# Patient Record
Sex: Female | Born: 1935 | Race: White | Hispanic: No | Marital: Married | Smoking: Former smoker
Health system: Southern US, Community
[De-identification: ages and names within clinical notes are randomized; demographics above are authoritative.]

## PROBLEM LIST (undated history)

## (undated) DIAGNOSIS — R000 Tachycardia, unspecified: Secondary | ICD-10-CM

## (undated) DIAGNOSIS — I499 Cardiac arrhythmia, unspecified: Secondary | ICD-10-CM

## (undated) DIAGNOSIS — I714 Abdominal aortic aneurysm, without rupture, unspecified: Secondary | ICD-10-CM

## (undated) DIAGNOSIS — IMO0001 Reserved for inherently not codable concepts without codable children: Secondary | ICD-10-CM

## (undated) DIAGNOSIS — I509 Heart failure, unspecified: Secondary | ICD-10-CM

## (undated) DIAGNOSIS — E119 Type 2 diabetes mellitus without complications: Secondary | ICD-10-CM

## (undated) DIAGNOSIS — K219 Gastro-esophageal reflux disease without esophagitis: Secondary | ICD-10-CM

## (undated) DIAGNOSIS — J209 Acute bronchitis, unspecified: Secondary | ICD-10-CM

## (undated) DIAGNOSIS — I10 Essential (primary) hypertension: Secondary | ICD-10-CM

## (undated) DIAGNOSIS — D649 Anemia, unspecified: Secondary | ICD-10-CM

## (undated) HISTORY — DX: Cardiac arrhythmia, unspecified: I49.9

## (undated) HISTORY — PX: ABDOMINAL HYSTERECTOMY: SHX81

## (undated) HISTORY — DX: Anemia, unspecified: D64.9

## (undated) HISTORY — PX: BREAST LUMPECTOMY: SHX2

## (undated) HISTORY — DX: Acute bronchitis, unspecified: J20.9

## (undated) HISTORY — PX: OOPHORECTOMY: SHX86

## (undated) HISTORY — DX: Heart failure, unspecified: I50.9

## (undated) HISTORY — DX: Tachycardia, unspecified: R00.0

---

## 1999-08-20 ENCOUNTER — Encounter (INDEPENDENT_AMBULATORY_CARE_PROVIDER_SITE_OTHER): Payer: Self-pay

## 1999-08-20 ENCOUNTER — Ambulatory Visit (HOSPITAL_COMMUNITY): Admission: RE | Admit: 1999-08-20 | Discharge: 1999-08-20 | Payer: Self-pay | Admitting: Gastroenterology

## 2000-09-19 ENCOUNTER — Encounter: Payer: Self-pay | Admitting: Gastroenterology

## 2000-09-19 ENCOUNTER — Encounter: Admission: RE | Admit: 2000-09-19 | Discharge: 2000-09-19 | Payer: Self-pay | Admitting: Gastroenterology

## 2002-01-12 ENCOUNTER — Encounter: Payer: Self-pay | Admitting: Emergency Medicine

## 2002-01-12 ENCOUNTER — Inpatient Hospital Stay (HOSPITAL_COMMUNITY): Admission: EM | Admit: 2002-01-12 | Discharge: 2002-01-15 | Payer: Self-pay | Admitting: Emergency Medicine

## 2004-07-04 ENCOUNTER — Encounter (INDEPENDENT_AMBULATORY_CARE_PROVIDER_SITE_OTHER): Payer: Self-pay | Admitting: *Deleted

## 2004-07-04 ENCOUNTER — Ambulatory Visit (HOSPITAL_COMMUNITY): Admission: RE | Admit: 2004-07-04 | Discharge: 2004-07-04 | Payer: Self-pay | Admitting: Gastroenterology

## 2009-07-07 ENCOUNTER — Emergency Department (HOSPITAL_COMMUNITY): Admission: EM | Admit: 2009-07-07 | Discharge: 2009-07-07 | Payer: Self-pay | Admitting: Emergency Medicine

## 2009-12-29 ENCOUNTER — Ambulatory Visit: Payer: Self-pay | Admitting: Vascular Surgery

## 2010-04-07 ENCOUNTER — Emergency Department (HOSPITAL_COMMUNITY): Admission: EM | Admit: 2010-04-07 | Discharge: 2010-04-07 | Payer: Self-pay | Admitting: Emergency Medicine

## 2010-06-22 ENCOUNTER — Encounter: Admission: RE | Admit: 2010-06-22 | Discharge: 2010-06-22 | Payer: Self-pay | Admitting: Vascular Surgery

## 2010-06-22 ENCOUNTER — Ambulatory Visit: Payer: Self-pay | Admitting: Vascular Surgery

## 2010-07-13 ENCOUNTER — Ambulatory Visit: Payer: Self-pay | Admitting: Vascular Surgery

## 2010-07-19 ENCOUNTER — Ambulatory Visit (HOSPITAL_COMMUNITY): Admission: RE | Admit: 2010-07-19 | Discharge: 2010-07-19 | Payer: Self-pay | Admitting: Vascular Surgery

## 2010-07-30 ENCOUNTER — Encounter: Payer: Self-pay | Admitting: Vascular Surgery

## 2010-07-30 ENCOUNTER — Ambulatory Visit: Payer: Self-pay | Admitting: Vascular Surgery

## 2010-07-30 ENCOUNTER — Inpatient Hospital Stay (HOSPITAL_COMMUNITY): Admission: RE | Admit: 2010-07-30 | Discharge: 2010-08-06 | Payer: Self-pay | Admitting: Vascular Surgery

## 2010-09-11 ENCOUNTER — Ambulatory Visit: Payer: Self-pay | Admitting: Vascular Surgery

## 2010-11-13 ENCOUNTER — Ambulatory Visit: Payer: Self-pay | Admitting: Vascular Surgery

## 2010-12-02 HISTORY — PX: ABDOMINAL AORTIC ANEURYSM REPAIR: SUR1152

## 2011-02-14 LAB — TYPE AND SCREEN
ABO/RH(D): O POS
ABO/RH(D): O POS
Antibody Screen: NEGATIVE

## 2011-02-14 LAB — CBC
HCT: 26.8 % — ABNORMAL LOW (ref 36.0–46.0)
HCT: 26.7 % — ABNORMAL LOW (ref 36.0–46.0)
HCT: 31.8 % — ABNORMAL LOW (ref 36.0–46.0)
HCT: 35.4 % — ABNORMAL LOW (ref 36.0–46.0)
HCT: 36.7 % (ref 36.0–46.0)
Hemoglobin: 8.4 g/dL — ABNORMAL LOW (ref 12.0–15.0)
Hemoglobin: 11.5 g/dL — ABNORMAL LOW (ref 12.0–15.0)
Hemoglobin: 12 g/dL (ref 12.0–15.0)
Hemoglobin: 8.6 g/dL — ABNORMAL LOW (ref 12.0–15.0)
Hemoglobin: 9.9 g/dL — ABNORMAL LOW (ref 12.0–15.0)
MCH: 28.7 pg (ref 26.0–34.0)
MCH: 28 pg (ref 26.0–34.0)
MCHC: 31.3 g/dL (ref 30.0–36.0)
MCHC: 32.2 g/dL (ref 30.0–36.0)
MCV: 89.9 fL (ref 78.0–100.0)
MCV: 90.5 fL (ref 78.0–100.0)
MCV: 89.6 fL (ref 78.0–100.0)
MCV: 89.7 fL (ref 78.0–100.0)
MCV: 90.1 fL (ref 78.0–100.0)
Platelets: 274 10*3/uL (ref 150–400)
RBC: 2.75 MIL/uL — ABNORMAL LOW (ref 3.87–5.11)
RBC: 3.53 MIL/uL — ABNORMAL LOW (ref 3.87–5.11)
RBC: 3.95 MIL/uL (ref 3.87–5.11)
RBC: 4.09 MIL/uL (ref 3.87–5.11)
RDW: 15.2 % (ref 11.5–15.5)
RDW: 15.1 % (ref 11.5–15.5)
WBC: 10 10*3/uL (ref 4.0–10.5)
WBC: 10.3 10*3/uL (ref 4.0–10.5)
WBC: 7.4 10*3/uL (ref 4.0–10.5)

## 2011-02-14 LAB — BLOOD GAS, ARTERIAL
Acid-base deficit: 1.5 mmol/L (ref 0.0–2.0)
Acid-base deficit: 2 mmol/L (ref 0.0–2.0)
Acid-base deficit: 2.5 mmol/L — ABNORMAL HIGH (ref 0.0–2.0)
Bicarbonate: 21.6 mEq/L (ref 20.0–24.0)
Bicarbonate: 21.8 mEq/L (ref 20.0–24.0)
Bicarbonate: 23.7 mEq/L (ref 20.0–24.0)
O2 Content: 4 L/min
O2 Saturation: 95.7 %
Patient temperature: 98.6
TCO2: 22.7 mmol/L (ref 0–100)
TCO2: 22.9 mmol/L (ref 0–100)
TCO2: 25.1 mmol/L (ref 0–100)
pCO2 arterial: 34.5 mmHg — ABNORMAL LOW (ref 35.0–45.0)
pCO2 arterial: 35.8 mmHg (ref 35.0–45.0)
pCO2 arterial: 47.1 mmHg — ABNORMAL HIGH (ref 35.0–45.0)
pH, Arterial: 7.322 — ABNORMAL LOW (ref 7.350–7.400)
pH, Arterial: 7.397 (ref 7.350–7.400)
pO2, Arterial: 78.7 mmHg — ABNORMAL LOW (ref 80.0–100.0)
pO2, Arterial: 88.3 mmHg (ref 80.0–100.0)

## 2011-02-14 LAB — GLUCOSE, CAPILLARY
Glucose-Capillary: 102 mg/dL — ABNORMAL HIGH (ref 70–99)
Glucose-Capillary: 104 mg/dL — ABNORMAL HIGH (ref 70–99)
Glucose-Capillary: 106 mg/dL — ABNORMAL HIGH (ref 70–99)
Glucose-Capillary: 110 mg/dL — ABNORMAL HIGH (ref 70–99)
Glucose-Capillary: 119 mg/dL — ABNORMAL HIGH (ref 70–99)
Glucose-Capillary: 123 mg/dL — ABNORMAL HIGH (ref 70–99)
Glucose-Capillary: 128 mg/dL — ABNORMAL HIGH (ref 70–99)
Glucose-Capillary: 131 mg/dL — ABNORMAL HIGH (ref 70–99)
Glucose-Capillary: 131 mg/dL — ABNORMAL HIGH (ref 70–99)
Glucose-Capillary: 133 mg/dL — ABNORMAL HIGH (ref 70–99)
Glucose-Capillary: 133 mg/dL — ABNORMAL HIGH (ref 70–99)
Glucose-Capillary: 139 mg/dL — ABNORMAL HIGH (ref 70–99)
Glucose-Capillary: 147 mg/dL — ABNORMAL HIGH (ref 70–99)
Glucose-Capillary: 151 mg/dL — ABNORMAL HIGH (ref 70–99)
Glucose-Capillary: 159 mg/dL — ABNORMAL HIGH (ref 70–99)

## 2011-02-14 LAB — URINALYSIS, ROUTINE W REFLEX MICROSCOPIC
Bilirubin Urine: NEGATIVE
Bilirubin Urine: NEGATIVE
Glucose, UA: NEGATIVE mg/dL
Glucose, UA: NEGATIVE mg/dL
Hgb urine dipstick: NEGATIVE
Ketones, ur: NEGATIVE mg/dL
Nitrite: POSITIVE — AB
Protein, ur: NEGATIVE mg/dL
Specific Gravity, Urine: 1.016 (ref 1.005–1.030)
pH: 6 (ref 5.0–8.0)

## 2011-02-14 LAB — COMPREHENSIVE METABOLIC PANEL
ALT: 10 U/L (ref 0–35)
ALT: 11 U/L (ref 0–35)
ALT: 11 U/L (ref 0–35)
Alkaline Phosphatase: 20 U/L — ABNORMAL LOW (ref 39–117)
Alkaline Phosphatase: 49 U/L (ref 39–117)
Alkaline Phosphatase: 52 U/L (ref 39–117)
BUN: 19 mg/dL (ref 6–23)
BUN: 22 mg/dL (ref 6–23)
CO2: 24 mEq/L (ref 19–32)
CO2: 25 mEq/L (ref 19–32)
Chloride: 107 mEq/L (ref 96–112)
Chloride: 111 mEq/L (ref 96–112)
GFR calc non Af Amer: 50 mL/min — ABNORMAL LOW (ref >60)
Glucose, Bld: 113 mg/dL — ABNORMAL HIGH (ref 70–99)
Glucose, Bld: 126 mg/dL — ABNORMAL HIGH (ref 70–99)
Glucose, Bld: 153 mg/dL — ABNORMAL HIGH (ref 70–99)
Potassium: 3.8 mEq/L (ref 3.5–5.1)
Potassium: 4.1 mEq/L (ref 3.5–5.1)
Potassium: 4.1 mEq/L (ref 3.5–5.1)
Sodium: 134 mEq/L — ABNORMAL LOW (ref 135–145)
Sodium: 138 mEq/L (ref 135–145)
Sodium: 139 mEq/L (ref 135–145)
Total Bilirubin: 0.4 mg/dL (ref 0.3–1.2)
Total Bilirubin: 0.5 mg/dL (ref 0.3–1.2)
Total Bilirubin: 0.6 mg/dL (ref 0.3–1.2)
Total Protein: 7.1 g/dL (ref 6.0–8.3)

## 2011-02-14 LAB — POCT I-STAT 7, (LYTES, BLD GAS, ICA,H+H)
Calcium, Ion: 1.13 mmol/L (ref 1.12–1.32)
O2 Saturation: 100 %
Potassium: 4 mEq/L (ref 3.5–5.1)
Sodium: 136 mEq/L (ref 135–145)
TCO2: 22 mmol/L (ref 0–100)

## 2011-02-14 LAB — BASIC METABOLIC PANEL
BUN: 13 mg/dL (ref 6–23)
CO2: 25 mEq/L (ref 19–32)
Calcium: 7.7 mg/dL — ABNORMAL LOW (ref 8.4–10.5)
Chloride: 110 mEq/L (ref 96–112)
Chloride: 107 mEq/L (ref 96–112)
Creatinine, Ser: 0.82 mg/dL (ref 0.4–1.2)
GFR calc Af Amer: >60 mL/min (ref >60)
GFR calc Af Amer: >60 mL/min (ref >60)
Glucose, Bld: 117 mg/dL — ABNORMAL HIGH (ref 70–99)
Potassium: 3.7 mEq/L (ref 3.5–5.1)
Sodium: 141 mEq/L (ref 135–145)
Sodium: 135 mEq/L (ref 135–145)

## 2011-02-14 LAB — PROTIME-INR
INR: 0.93 (ref 0.00–1.49)
INR: 0.95 (ref 0.00–1.49)
Prothrombin Time: 12.7 seconds (ref 11.6–15.2)
Prothrombin Time: 12.9 seconds (ref 11.6–15.2)

## 2011-02-14 LAB — URINE MICROSCOPIC-ADD ON

## 2011-02-14 LAB — SURGICAL PCR SCREEN: MRSA, PCR: NEGATIVE

## 2011-02-14 LAB — MAGNESIUM
Magnesium: 1.8 mg/dL (ref 1.5–2.5)
Magnesium: 1.9 mg/dL (ref 1.5–2.5)

## 2011-02-14 LAB — APTT: aPTT: 33 seconds (ref 24–37)

## 2011-02-14 LAB — AMYLASE: Amylase: 170 U/L — ABNORMAL HIGH (ref 0–105)

## 2011-02-19 LAB — URINALYSIS, ROUTINE W REFLEX MICROSCOPIC
Glucose, UA: NEGATIVE mg/dL
Leukocytes, UA: NEGATIVE
Specific Gravity, Urine: 1.009 (ref 1.005–1.030)
Urobilinogen, UA: 0.2 mg/dL (ref 0.0–1.0)

## 2011-02-19 LAB — URINE MICROSCOPIC-ADD ON

## 2011-02-19 LAB — URINE CULTURE: Colony Count: NO GROWTH

## 2011-03-10 LAB — URINALYSIS, ROUTINE W REFLEX MICROSCOPIC
Ketones, ur: NEGATIVE mg/dL
Nitrite: POSITIVE — AB
Protein, ur: NEGATIVE mg/dL
Urobilinogen, UA: 1 mg/dL (ref 0.0–1.0)
pH: 6 (ref 5.0–8.0)

## 2011-03-10 LAB — URINE CULTURE: Culture: NO GROWTH

## 2011-03-10 LAB — URINE MICROSCOPIC-ADD ON

## 2011-04-16 NOTE — Assessment & Plan Note (Signed)
OFFICE VISIT   [name], [name]  DOB:  1925-08-23                                       06/22/2010  FAOZH#:[id number]   The patient presents today for continued discussion regarding her  infrarenal abdominal aortic aneurysm.  I had seen her initially in  January 2011.  She has an infrarenal abdominal aortic aneurysm and this  been followed since December 2007.  Serial ultrasounds had shown  continued progressive enlargement of the aneurysm over time and at my  initial visit I had recommend that we see her in 6 months with CT scan  for better imaging and definition of her aneurysm.  She has had no major  medical difficulties since my last visit with her in January.  She does  have a history of hypertension and noninsulin-dependent diabetes.   SOCIAL HISTORY:  She quit smoking in 1975 and does not drink alcohol.   REVIEW OF SYSTEMS:  Unchanged from initial visit 6 months ago.   PHYSICAL EXAM:  Well-nourished, well-nourished, white female appearing  younger than stated age of 55 with blood pressure 135/86, pulse 81,  respirations 16.  She is in no acute distress.  HEENT is negative.  Chest:  Clear bilaterally.  Heart:  Regular rate and rhythm.  Her  abdomen is soft, nontender.  She does have a prominent aortic pulsation.  Musculoskeletal shows no major deformities or cyanosis.  Neurologic:  No  focal weakness or paresthesias.  Skin:  Without ulcers or rashes.   She underwent a CT scan and I have independently reviewed this and  discussed it with the patient.  This does show continued progression in  size up to 5.8 cm of her aneurysm.  She does have extreme tortuosity of  her distal aorta and iliac segments, and also has a relatively short  neck with mild reversed taper at the infrarenal segment of her aorta.  I  discussed this at length with the patient, explaining that this made her  not a favorable candidate for open stent graft repair, but it does not  preclude standard open surgical repair.  She does not feel well  currently and feels that this is related to the dye that she received at  the time of her CAT scan earlier today.  She is not anxious to consider  prolonged long-term followup with serial CT scans for stent graft  followup even if she was a candidate.  We will schedule her to have  preoperative cardiac clearance with [facility] and I will see her  back once this has been obtained to discuss this further.  She had asked  that I send this evaluation to her husband and he will be present for  our next discussion to finalize the decision regarding repair assuming  no cardiac contraindications.     Jennifer [name], M.D.  Electronically Signed   TFE/MEDQ  D:  06/22/2010  T:  06/25/2010  Job:  [id number]   cc:   Jennifer [name], M.D.  [name] (home address)

## 2011-04-16 NOTE — Assessment & Plan Note (Signed)
OFFICE VISIT   QUETZALY, EBNER  DOB:  September 19, 1925                                       09/11/2010  ZOXWR#:[id number]   Patient presents today for follow-up of her resection grafting of  abdominal aortic aneurysm at [facility] on 07/30/2010.  She did  quite well in the hospital and was discharged to home on 08/06/2010.  She has the usual amount of diminished stamina and diminished appetite  and slow return of bowel function.  She is requiring a stool softener on  occasion.   On physical exam, she looks quite good.  Abdominal wound is well-healed  with no evidence of hernia.  She has palpable femoral pulses.   I am quite pleased with her initial result as patient.  Will plan to see  her again in 2 months for continued follow-up.     Larina Earthly, M.D.  Electronically Signed   TFE/MEDQ  D:  09/11/2010  T:  09/12/2010  Job:  [id number]   cc:   Theressa Millard, M.D.

## 2011-04-16 NOTE — Assessment & Plan Note (Signed)
OFFICE VISIT   Jennifer [name], Jennifer [name]  DOB:  1925-05-17                                       11/13/2010  ZOXWR#:[id number]   The patient presents today for final follow-up after resection and  grafting of her abdominal aortic aneurysm on 07/30/2010. She had a  straight tube graft 18 mm Dacron Hemashield graft.  She did well in the  hospital and continues to do well.  She has returned to her baseline  stamina.   She reports that she occasionally has some left lower quadrant  discomfort and reports this is related to some constipation.  Her  abdominal exam is well-healed with no evidence of hernia.  She has 2+  femoral pulses bilaterally.  I am quite pleased with her continued  progress.   She will continue full activity without limitation and will see [place] again  on an as-needed basis.     Larina Earthly, M.D.  Electronically Signed   TFE/MEDQ  D:  11/13/2010  T:  11/14/2010  Job:  [id number]   cc:   Theressa Millard, M.D.

## 2011-04-16 NOTE — Consult Note (Signed)
NEW PATIENT CONSULTATION   Jennifer [name], Jennifer [name]  DOB:  1925/01/22                                       12/29/2009  ZOXWR#:[id number]   The patient presents today for evaluation of infrarenal abdominal aortic  aneurysm.  This been known since December 2007.  I have reviewed the  medical records provided by Dr. Theressa Millard regarding this.  This has  been followed with ultrasound and I had several of the actual ultrasound  studies.  Most recently, it is reported being 5 cm 1 week ago, was 4.7  cm in June 2010 and 4.6 cm in January 2010.  She has no symptoms  referable to the aneurysm.  This was an incidental finding in 2007.   PAST MEDICAL HISTORY:  Significant for hypertension, for which she has  some medications.  She also has non-insulin diabetes mellitus.   FAMILY HISTORY:  Negative for aneurysmal disease or premature carotid  disease.   SOCIAL HISTORY:  She is married with 2 children.  She is retired.  She  does not smoke having quit 1975.  Does not drink alcohol.   REVIEW OF SYSTEMS:  Her weight is reported 158 pounds.  She has had no  weight loss or weight gain.  CARDIAC:  Negative for chest tightness or chest pain.  PULMONARY:  Negative for home oxygen or bronchitis.  GI:  Negative for GI bleeding or reflux.  GU:  Positive for urinary frequency.  VASCULAR:  Negative for claudication or lower extremity ischemia.  NEUROLOGIC:  No dizziness, blackouts or stroke.  MUSCULOSKELETAL:  Positive arthritis.  PSYCHIATRIC:  Negative depression, anxiety.  EENT:  Negative for eyesight changes or change in hearing.  HEMATOLOGIC:  No bleeding problems.  SKIN:  Negative for rashes.   PHYSICAL EXAMINATION:  General:  Well-developed, well-nourished white  female appearing stated of age 77.  She is no acute distress.  Vital  signs:  Blood pressure 130/80, pulse 76, respirations 18, temperature  98.0.  HEENT:  Normal.  Neck:  Without JVD or cervical lymphadenopathy.  Chest:  Clear bilaterally with no rales, rhonchi or wheezes.  Cardiovascular: Heart with regular rate and rhythm.  Her radial, femoral  and popliteal pulses are 2+ bilaterally.  Abdomen:  Soft, nontender.  She does have prominent aortic pulsation.  No other masses.  Musculoskeletal:  No major deformities, cyanosis.  Neurologic:  No focal  weakness or paresthesias.  Skin:  Without ulcers or rashes.   I have discussed the significance of her infrarenal aneurysm at great  length with the patient with her husband present. I explained that I  feel comfortable with continued nonoperative observation only.  I  explained that her aneurysm size of 5 cm and the growth of 3-4 mm in 1  year is typical.  I have recommend that we see her in 6 months with a  CAT scan at that time.  I did discuss options of open and stent graft  repair and would make a determination if she is a candidate for either  and I would recommend intervention if she has progression in size.     Larina Earthly, M.D.  Electronically Signed   TFE/MEDQ  D:  12/29/2009  T:  01/01/2010  Job:  [id number]   cc:   Theressa Millard, M.D.

## 2011-04-16 NOTE — H&P (Signed)
HISTORY AND PHYSICAL EXAMINATION   July 13, 2010   Re:  Jennifer [name], Jennifer [name]                 DOB:  [birth year]   DATE OF ADMISSION:  07/20/2010.   REASON FOR ADMISSION:  Infrarenal abdominal aortic aneurysm.   HISTORY OF PRESENT ILLNESS:  The patient is an 77 year old white female  with a known history of infrarenal abdominal aortic aneurysm.  Over  serial studies this has increased in size and her maximal diameter is  now 5.8 cm.  She is admitted for elective repair.  She is very healthy  for her age with a history of elevated lipids and well-controlled  hypertension.  She does not have any history of cardiac disease.   SOCIAL HISTORY:  She quit smoking in 1975.  She does not drink alcohol.   REVIEW OF SYSTEMS:  As documented in her chart and is essentially  unremarkable.   She does have a history of colon polyps, osteoporosis.   MEDICATIONS:  Gemfibrozil tablets 600 mg twice a day, felodipine tablets  __________ 24 hours 10 mg twice a day, metformin 500 mg 1 tablet twice a  day, aspirin 81 mg daily, nitrofurantoin suspension 25 mg every 6 hours,  multivitamin, vitamin D 5000 units 1 capsule once a week, calcium  tablets 600 mg as directed.   PHYSICAL EXAMINATION:  This is a well-developed, well-nourished white  female appearing younger than her stated age of 81.  Her blood pressure  is 135/90, pulse 84 respirations 20.  HEENT is normal.  Carotids are  without bruits bilaterally.  Chest is clear bilaterally without wheezes.  Heart is regular rate and rhythm without murmur.  She has 2+ radial and  2+ femoral pulses bilaterally.  Abdominal exam is soft, nontender.  Mild  obesity.  A low midline incision, well-healed.  Musculoskeletal:  She  has no major deformities or cyanosis.  Neurologic:  No focal weakness or  paresthesias.  Skin without ulcers or rashes.   Her CT scan did show infrarenal abdominal aortic aneurysm.  I had a very  long discussion with the  patient and her husband.  She does not have a  favorable anatomy for stent grafting.  She has a relatively short  infrarenal neck and has a reverse taper, making a type 1 possible  endoleak much more risky.  She also has extreme tortuosity of her iliac  vessels, making stent grafting more difficult than typical.  She had  seen Dr. Catalina Gravel as preop cardiac clearance and evaluation and had a  stress test which did not show any evidence of reversible ischemia,  making her low-risk from a cardiac standpoint.  I explained the options  to include continued observation versus repair and she wishes to proceed  with repair.  I explained the options for stent grafting versus open  repair and she does not wish to proceed with stent grafting, and I would  agree with this decision.  She is of significant risk for endoleak due  to her relatively unfavorable anatomy.  She wished to proceed with  surgery, and we are going to schedule this on 08/19 at [facility].     Larina Earthly, M.D.  Electronically Signed   TFE/MEDQ  D:  07/13/2010  T:  07/16/2010  Job:  [id number]   cc:   Theressa Millard, M.D.  Corky Crafts, MD

## 2011-04-19 NOTE — Op Note (Signed)
Jennifer [name], [name]                           ACCOUNT NO.:  [id number]   MEDICAL RECORD NO.:  [id number]                   PATIENT TYPE:  AMB   LOCATION:  ENDO                                 FACILITY:  [facility]   PHYSICIAN:  Petra Kuba, M.D.                 DATE OF BIRTH:  1925/06/08   DATE OF PROCEDURE:  07/04/2004  DATE OF DISCHARGE:                                 OPERATIVE REPORT   PROCEDURE:  Colonoscopy.   INDICATIONS FOR PROCEDURE:  History of colon polyps, due for repeat  screening.  Recent bout of diverticulitis.  Consent was signed after risks,  benefits, methods, and options were thoroughly discussed in the office  multiple times in the past.   MEDICATIONS USED:  Demerol 90, Versed 9.   PROCEDURE:  Rectal inspection was pertinent for small external hemorrhoids.  Digital exam was negative.  The video pediatric adjustable colonoscope was  inserted and easily advanced around the colon to the level of the ileocecal  valve.  This did not require any abdominal pressure or position changes.  On  insertion, some left greater than right diverticula were seen, no evidence  of active diverticulitis was seen.  To advance to the cecum required first  rolling her on her back and then on her right side with abdominal pressures.  We have had problems getting into the cecum since it does have an upward  turn it, but this time the appendiceal orifice was seen.  The scope was  slowly withdrawn.  The prep was adequate.  There was a some liquid stool  that required washing and suctioning.  The cecum and ascending were  pertinent for a rare right sided diverticula.  In the mid transverse, a tiny  polyp was seen and was hot biopsied x 1.  The scope was further withdrawn.  The left had larger and more frequent diverticula.  There was one  erythematous fold, I doubt it was a polyp, and two cold biopsies were  obtained and put in a second container.  This was in the mid sigmoid.  The  scope  was further withdrawn.  No additional findings were seen.  We slowly  withdrew back to the rectum.  Anorectal pull through and retroflexion  confirms some small hemorrhoids.  The scope was straightened and readvanced  up a short way up the left side of the colon, the air was suctioned, the  scope was removed.  The patient tolerated the procedure well.  There was no  obvious immediate complications.   ENDOSCOPIC DIAGNOSIS:  1. Internal and external hemorrhoids.  2. Left greater than right rare diverticula.  3. Normal sigmoid fold with some erythema status post biopsy.  4. Tiny mid transverse polyp hot biopsied.  5. Otherwise, within normal limits to the cecum.   PLAN:  Await pathology, consider repeat screening in five years if doing  well medically.  Happy to see back p.r.n., otherwise return care to Dr.  Earl Gala for the customary health care maintenance to include yearly rectals  and guaiacs.                                               Petra Kuba, M.D.    MEM/MEDQ  D:  07/04/2004  T:  07/04/2004  Job:  [id number]   cc:   Theressa Millard, M.D.  [address] [place]  [place] [address]  Fax: [phone]

## 2011-04-19 NOTE — Discharge Summary (Signed)
[place]  Patient:    IDALYS, KONECNY Visit Number: [id number] MRN: [id number]          Service Type: MED Location: 3000 [id number] Attending Physician:  Darnelle Bos Dictated by:   Theressa Millard, M.D. Admit Date:  01/12/2002 Discharge Date: 01/15/2002                             Discharge Summary  ADMITTING DIAGNOSIS:  Headache, fever, and mental status changes.  DISCHARGE DIAGNOSES: 1. Urinary tract infection, possible pyelonephritis. 2. Headache, mental status changes, and fever secondary to #1. 3. Dehydration. 4. Hypertension. 5. Hyperlipidemia. 6. Small-vessel disease on MRI.  Please see the admitting history and physical examination for detail.  HOSPITAL COURSE:  The patient was admitted with mental status changes, fever, and severe headache for the last five days.  Initially, white count was 12,600; hemoglobin was normal and electrolytes revealed a low potassium; and MRI scanning showed small-vessel disease but no findings to account for her mental status changes.  Her neck was supple and meningitis was thought to be unlikely.  However, her urinary studies revealed 30-50 white cells per high-power field.  A urine culture was ordered but was never done.  She was started on Tequin and IV fluids and within hospital day #1 was much better, with reduced headache and mental status changes that were clearing.  She continued on Tequin IV for the next 24 hours and continued to improve and regain strength.  She was switched to p.o. antibiotics during her last hospital day, ambulated, was able to eat without problem, and was discharged in improved condition.  Her hemoglobin did fall to 10.7 and this will be followed up as an outpatient.  Her hypokalemia was treated with potassium and resolved.  Her leukocytosis which developed up to a white count of 17,600 was improving at the time of discharge.  DISCHARGE MEDICATIONS: 1. Lopid 600  mg twice daily. 2. Norvasc 5 mg daily. 3. Tequin 400 mg once daily x 6 days. 4. She may use her Vicodin tablets and Xanax as before.  ACTIVITY:  No restrictions.  DIET:  No added salt.  FOLLOW-UP:  She will call to make an appointment to see me in approximately two weeks. Dictated by:   Theressa Millard, M.D. Attending Physician:  Darnelle Bos DD:  01/15/02 TD:  01/15/02 Job: [id number]

## 2011-05-03 ENCOUNTER — Other Ambulatory Visit: Payer: Self-pay | Admitting: Internal Medicine

## 2011-05-03 DIAGNOSIS — Z1231 Encounter for screening mammogram for malignant neoplasm of breast: Secondary | ICD-10-CM

## 2011-05-17 ENCOUNTER — Ambulatory Visit: Payer: Self-pay

## 2011-05-17 ENCOUNTER — Ambulatory Visit
Admission: RE | Admit: 2011-05-17 | Discharge: 2011-05-17 | Disposition: A | Discharge status: Home / Self Care | Payer: BLUE CROSS/BLUE SHIELD | Source: Ambulatory Visit | Attending: Internal Medicine | Admitting: Internal Medicine

## 2011-05-17 DIAGNOSIS — Z1231 Encounter for screening mammogram for malignant neoplasm of breast: Secondary | ICD-10-CM

## 2012-11-23 ENCOUNTER — Other Ambulatory Visit: Payer: Self-pay | Admitting: Urology

## 2012-11-23 DIAGNOSIS — N3020 Other chronic cystitis without hematuria: Secondary | ICD-10-CM

## 2012-11-23 DIAGNOSIS — G8929 Other chronic pain: Secondary | ICD-10-CM | POA: Insufficient documentation

## 2012-11-26 ENCOUNTER — Other Ambulatory Visit: Payer: BLUE CROSS/BLUE SHIELD

## 2012-11-30 ENCOUNTER — Ambulatory Visit
Admission: RE | Admit: 2012-11-30 | Discharge: 2012-11-30 | Disposition: A | Discharge status: Home / Self Care | Payer: Medicare Other | Source: Ambulatory Visit | Attending: Urology | Admitting: Urology

## 2012-11-30 DIAGNOSIS — N3020 Other chronic cystitis without hematuria: Secondary | ICD-10-CM

## 2013-12-07 ENCOUNTER — Other Ambulatory Visit: Payer: Self-pay | Admitting: Dermatology

## 2014-08-04 ENCOUNTER — Other Ambulatory Visit: Payer: Self-pay | Admitting: Gastroenterology

## 2014-08-04 DIAGNOSIS — R1310 Dysphagia, unspecified: Secondary | ICD-10-CM

## 2014-08-10 ENCOUNTER — Ambulatory Visit
Admission: RE | Admit: 2014-08-10 | Discharge: 2014-08-10 | Disposition: A | Discharge status: Home / Self Care | Payer: Medicare Other | Source: Ambulatory Visit | Attending: Gastroenterology | Admitting: Gastroenterology

## 2014-08-10 DIAGNOSIS — R1310 Dysphagia, unspecified: Secondary | ICD-10-CM

## 2014-08-28 ENCOUNTER — Emergency Department (HOSPITAL_COMMUNITY): Payer: Medicare Other

## 2014-08-28 ENCOUNTER — Encounter (HOSPITAL_COMMUNITY): Payer: Self-pay | Admitting: Emergency Medicine

## 2014-08-28 ENCOUNTER — Inpatient Hospital Stay (HOSPITAL_COMMUNITY)
Admission: EM | Admit: 2014-08-28 | Discharge: 2014-09-12 | DRG: 299 | Disposition: A | Discharge status: Skilled Nursing Facility | Payer: Medicare Other | Attending: Internal Medicine | Admitting: Internal Medicine

## 2014-08-28 DIAGNOSIS — I871 Compression of vein: Secondary | ICD-10-CM | POA: Diagnosis present

## 2014-08-28 DIAGNOSIS — G9340 Encephalopathy, unspecified: Secondary | ICD-10-CM | POA: Diagnosis not present

## 2014-08-28 DIAGNOSIS — N179 Acute kidney failure, unspecified: Secondary | ICD-10-CM | POA: Diagnosis not present

## 2014-08-28 DIAGNOSIS — N183 Chronic kidney disease, stage 3 unspecified: Secondary | ICD-10-CM | POA: Diagnosis present

## 2014-08-28 DIAGNOSIS — E119 Type 2 diabetes mellitus without complications: Secondary | ICD-10-CM | POA: Diagnosis present

## 2014-08-28 DIAGNOSIS — I71019 Dissection of thoracic aorta, unspecified: Secondary | ICD-10-CM | POA: Diagnosis not present

## 2014-08-28 DIAGNOSIS — J810 Acute pulmonary edema: Secondary | ICD-10-CM

## 2014-08-28 DIAGNOSIS — I9589 Other hypotension: Secondary | ICD-10-CM

## 2014-08-28 DIAGNOSIS — N281 Cyst of kidney, acquired: Secondary | ICD-10-CM | POA: Diagnosis present

## 2014-08-28 DIAGNOSIS — R079 Chest pain, unspecified: Secondary | ICD-10-CM | POA: Diagnosis present

## 2014-08-28 DIAGNOSIS — I7101 Dissection of thoracic aorta: Principal | ICD-10-CM | POA: Diagnosis present

## 2014-08-28 DIAGNOSIS — Z7982 Long term (current) use of aspirin: Secondary | ICD-10-CM

## 2014-08-28 DIAGNOSIS — R570 Cardiogenic shock: Secondary | ICD-10-CM | POA: Diagnosis not present

## 2014-08-28 DIAGNOSIS — E876 Hypokalemia: Secondary | ICD-10-CM | POA: Diagnosis not present

## 2014-08-28 DIAGNOSIS — Z791 Long term (current) use of non-steroidal anti-inflammatories (NSAID): Secondary | ICD-10-CM

## 2014-08-28 DIAGNOSIS — E785 Hyperlipidemia, unspecified: Secondary | ICD-10-CM | POA: Diagnosis present

## 2014-08-28 DIAGNOSIS — I7100 Dissection of unspecified site of aorta: Secondary | ICD-10-CM

## 2014-08-28 DIAGNOSIS — D649 Anemia, unspecified: Secondary | ICD-10-CM | POA: Diagnosis present

## 2014-08-28 DIAGNOSIS — I5033 Acute on chronic diastolic (congestive) heart failure: Secondary | ICD-10-CM | POA: Diagnosis present

## 2014-08-28 DIAGNOSIS — J9600 Acute respiratory failure, unspecified whether with hypoxia or hypercapnia: Secondary | ICD-10-CM | POA: Diagnosis not present

## 2014-08-28 DIAGNOSIS — I2510 Atherosclerotic heart disease of native coronary artery without angina pectoris: Secondary | ICD-10-CM | POA: Diagnosis present

## 2014-08-28 DIAGNOSIS — J9811 Atelectasis: Secondary | ICD-10-CM | POA: Diagnosis not present

## 2014-08-28 DIAGNOSIS — I129 Hypertensive chronic kidney disease with stage 1 through stage 4 chronic kidney disease, or unspecified chronic kidney disease: Secondary | ICD-10-CM | POA: Diagnosis present

## 2014-08-28 DIAGNOSIS — K219 Gastro-esophageal reflux disease without esophagitis: Secondary | ICD-10-CM | POA: Diagnosis present

## 2014-08-28 DIAGNOSIS — R5381 Other malaise: Secondary | ICD-10-CM

## 2014-08-28 DIAGNOSIS — J9601 Acute respiratory failure with hypoxia: Secondary | ICD-10-CM | POA: Diagnosis not present

## 2014-08-28 DIAGNOSIS — R911 Solitary pulmonary nodule: Secondary | ICD-10-CM | POA: Diagnosis present

## 2014-08-28 DIAGNOSIS — I712 Thoracic aortic aneurysm, without rupture, unspecified: Secondary | ICD-10-CM

## 2014-08-28 DIAGNOSIS — I10 Essential (primary) hypertension: Secondary | ICD-10-CM

## 2014-08-28 DIAGNOSIS — I7123 Aneurysm of the descending thoracic aorta, without rupture: Secondary | ICD-10-CM | POA: Diagnosis present

## 2014-08-28 DIAGNOSIS — J189 Pneumonia, unspecified organism: Secondary | ICD-10-CM

## 2014-08-28 DIAGNOSIS — Q268 Other congenital malformations of great veins: Secondary | ICD-10-CM

## 2014-08-28 DIAGNOSIS — I2584 Coronary atherosclerosis due to calcified coronary lesion: Secondary | ICD-10-CM

## 2014-08-28 HISTORY — DX: Type 2 diabetes mellitus without complications: E11.9

## 2014-08-28 HISTORY — DX: Essential (primary) hypertension: I10

## 2014-08-28 HISTORY — DX: Abdominal aortic aneurysm, without rupture: I71.4

## 2014-08-28 HISTORY — DX: Abdominal aortic aneurysm, without rupture, unspecified: I71.40

## 2014-08-28 LAB — CBC WITH DIFFERENTIAL/PLATELET
BASOS ABS: 0 10*3/uL (ref 0.0–0.1)
BASOS PCT: 0 % (ref 0–1)
EOS ABS: 0 10*3/uL (ref 0.0–0.7)
EOS PCT: 0 % (ref 0–5)
HCT: 37.6 % (ref 36.0–46.0)
Hemoglobin: 12 g/dL (ref 12.0–15.0)
LYMPHS PCT: 13 % (ref 12–46)
Lymphs Abs: 1.6 10*3/uL (ref 0.7–4.0)
MCH: 29.3 pg (ref 26.0–34.0)
MCHC: 31.9 g/dL (ref 30.0–36.0)
MCV: 91.9 fL (ref 78.0–100.0)
Monocytes Absolute: 0.6 10*3/uL (ref 0.1–1.0)
Monocytes Relative: 5 % (ref 3–12)
Neutro Abs: 9.9 10*3/uL — ABNORMAL HIGH (ref 1.7–7.7)
Neutrophils Relative %: 82 % — ABNORMAL HIGH (ref 43–77)
PLATELETS: 230 10*3/uL (ref 150–400)
RBC: 4.09 MIL/uL (ref 3.87–5.11)
RDW: 15 % (ref 11.5–15.5)
WBC: 12.1 10*3/uL — AB (ref 4.0–10.5)

## 2014-08-28 LAB — COMPREHENSIVE METABOLIC PANEL
ALBUMIN: 4.2 g/dL (ref 3.5–5.2)
ALT: 11 U/L (ref 0–35)
AST: 13 U/L (ref 0–37)
Alkaline Phosphatase: 50 U/L (ref 39–117)
Anion gap: 15 (ref 5–15)
BUN: 20 mg/dL (ref 6–23)
CALCIUM: 10.7 mg/dL — AB (ref 8.4–10.5)
CO2: 27 meq/L (ref 19–32)
Chloride: 97 mEq/L (ref 96–112)
Creatinine, Ser: 1.01 mg/dL (ref 0.50–1.10)
GFR calc Af Amer: 57 mL/min — ABNORMAL LOW (ref >90)
GFR calc non Af Amer: 49 mL/min — ABNORMAL LOW (ref >90)
Glucose, Bld: 171 mg/dL — ABNORMAL HIGH (ref 70–99)
Potassium: 3.9 mEq/L (ref 3.7–5.3)
SODIUM: 139 meq/L (ref 137–147)
TOTAL PROTEIN: 8 g/dL (ref 6.0–8.3)
Total Bilirubin: 0.2 mg/dL — ABNORMAL LOW (ref 0.3–1.2)

## 2014-08-28 LAB — TROPONIN I

## 2014-08-28 LAB — I-STAT TROPONIN, ED
Troponin i, poc: 0.02 ng/mL (ref 0.00–0.08)
Troponin i, poc: 0.01 ng/mL (ref 0.00–0.08)

## 2014-08-28 LAB — PROTIME-INR
INR: 1.03 (ref 0.00–1.49)
Prothrombin Time: 13.5 seconds (ref 11.6–15.2)

## 2014-08-28 LAB — MRSA PCR SCREENING: MRSA by PCR: NEGATIVE

## 2014-08-28 LAB — LIPASE, BLOOD: Lipase: 42 U/L (ref 11–59)

## 2014-08-28 LAB — GLUCOSE, CAPILLARY
GLUCOSE-CAPILLARY: 160 mg/dL — AB (ref 70–99)
GLUCOSE-CAPILLARY: 204 mg/dL — AB (ref 70–99)

## 2014-08-28 LAB — TSH: TSH: 1.28 u[IU]/mL (ref 0.350–4.500)

## 2014-08-28 LAB — APTT: aPTT: 28 seconds (ref 24–37)

## 2014-08-28 LAB — MAGNESIUM: Magnesium: 1.7 mg/dL (ref 1.5–2.5)

## 2014-08-28 MED ORDER — SODIUM CHLORIDE 0.9 % IV SOLN
INTRAVENOUS | Status: DC
Start: 2014-08-28 — End: 2014-09-01
  Administered 2014-08-30: 22:00:00 via INTRAVENOUS

## 2014-08-28 MED ORDER — HYDROCODONE-ACETAMINOPHEN 5-325 MG PO TABS
1.0000 | ORAL_TABLET | Freq: Four times a day (QID) | ORAL | Status: DC | PRN
  Administered 2014-08-28 – 2014-09-12 (×21): 1 via ORAL
  Filled 2014-08-28 (×21): qty 1

## 2014-08-28 MED ORDER — CALCIUM CARBONATE 1250 (500 CA) MG PO TABS
1.0000 | ORAL_TABLET | Freq: Two times a day (BID) | ORAL | Status: DC
  Administered 2014-08-29 – 2014-09-12 (×27): 500 mg via ORAL
  Filled 2014-08-28 (×32): qty 1

## 2014-08-28 MED ORDER — ALPRAZOLAM 0.25 MG PO TABS
0.2500 mg | ORAL_TABLET | Freq: Two times a day (BID) | ORAL | Status: DC | PRN
  Administered 2014-08-29 – 2014-08-31 (×2): 0.25 mg via ORAL
  Filled 2014-08-28 (×2): qty 1

## 2014-08-28 MED ORDER — LABETALOL HCL 5 MG/ML IV SOLN
0.5000 mg/min | INTRAVENOUS | Status: DC
  Administered 2014-08-28: 1 mg/min via INTRAVENOUS
  Administered 2014-08-28 (×2): 3 mg/min via INTRAVENOUS
  Filled 2014-08-28 (×3): qty 100

## 2014-08-28 MED ORDER — ONDANSETRON HCL 4 MG/2ML IJ SOLN
4.0000 mg | Freq: Four times a day (QID) | INTRAMUSCULAR | Status: DC | PRN
  Administered 2014-08-29: 4 mg via INTRAVENOUS
  Filled 2014-08-28: qty 2

## 2014-08-28 MED ORDER — MORPHINE SULFATE 2 MG/ML IJ SOLN
2.0000 mg | Freq: Once | INTRAMUSCULAR | Status: AC
  Administered 2014-08-28: 2 mg via INTRAVENOUS
  Filled 2014-08-28: qty 1

## 2014-08-28 MED ORDER — NITROGLYCERIN 0.4 MG SL SUBL
0.4000 mg | SUBLINGUAL_TABLET | SUBLINGUAL | Status: DC | PRN
  Administered 2014-08-28 (×2): 0.4 mg via SUBLINGUAL
  Filled 2014-08-28: qty 1

## 2014-08-28 MED ORDER — MORPHINE SULFATE 2 MG/ML IJ SOLN
2.0000 mg | INTRAMUSCULAR | Status: DC | PRN
  Administered 2014-08-28 – 2014-08-30 (×3): 2 mg via INTRAVENOUS
  Administered 2014-08-30: 4 mg via INTRAVENOUS
  Administered 2014-08-31 (×2): 2 mg via INTRAVENOUS
  Administered 2014-09-01: 4 mg via INTRAVENOUS
  Administered 2014-09-01 – 2014-09-03 (×3): 2 mg via INTRAVENOUS
  Administered 2014-09-03: 4 mg via INTRAVENOUS
  Administered 2014-09-04 (×4): 2 mg via INTRAVENOUS
  Administered 2014-09-05: 4 mg via INTRAVENOUS
  Administered 2014-09-05 – 2014-09-12 (×6): 2 mg via INTRAVENOUS
  Filled 2014-08-28 (×3): qty 1
  Filled 2014-08-28: qty 2
  Filled 2014-08-28 (×5): qty 1
  Filled 2014-08-28: qty 2
  Filled 2014-08-28 (×2): qty 1
  Filled 2014-08-28 (×2): qty 2
  Filled 2014-08-28 (×8): qty 1

## 2014-08-28 MED ORDER — LABETALOL HCL 5 MG/ML IV SOLN
0.5000 mg/min | INTRAVENOUS | Status: DC
  Administered 2014-08-29: 0.5 mg/min via INTRAVENOUS
  Administered 2014-08-30: 1 mg/min via INTRAVENOUS
  Administered 2014-08-30: 1.5 mg/min via INTRAVENOUS
  Filled 2014-08-28 (×5): qty 200

## 2014-08-28 MED ORDER — GEMFIBROZIL 600 MG PO TABS
600.0000 mg | ORAL_TABLET | Freq: Two times a day (BID) | ORAL | Status: DC
  Administered 2014-08-29 – 2014-09-06 (×17): 600 mg via ORAL
  Filled 2014-08-28 (×19): qty 1

## 2014-08-28 MED ORDER — POLYVINYL ALCOHOL 1.4 % OP SOLN
1.0000 [drp] | Freq: Two times a day (BID) | OPHTHALMIC | Status: DC
  Administered 2014-08-28 – 2014-09-06 (×12): 1 [drp] via OPHTHALMIC
  Filled 2014-08-28: qty 15

## 2014-08-28 MED ORDER — INSULIN ASPART 100 UNIT/ML ~~LOC~~ SOLN
0.0000 [IU] | Freq: Three times a day (TID) | SUBCUTANEOUS | Status: DC
  Administered 2014-08-29: 2 [IU] via SUBCUTANEOUS
  Administered 2014-08-29: 3 [IU] via SUBCUTANEOUS
  Administered 2014-08-29 – 2014-08-31 (×4): 2 [IU] via SUBCUTANEOUS
  Administered 2014-08-31 (×2): 1 [IU] via SUBCUTANEOUS
  Administered 2014-09-01: 2 [IU] via SUBCUTANEOUS
  Administered 2014-09-01: 1 [IU] via SUBCUTANEOUS
  Administered 2014-09-02: 2 [IU] via SUBCUTANEOUS
  Administered 2014-09-02 (×2): 1 [IU] via SUBCUTANEOUS
  Administered 2014-09-03: 2 [IU] via SUBCUTANEOUS
  Administered 2014-09-03: 3 [IU] via SUBCUTANEOUS
  Administered 2014-09-03: 1 [IU] via SUBCUTANEOUS
  Administered 2014-09-04: 2 [IU] via SUBCUTANEOUS
  Administered 2014-09-04 (×2): 1 [IU] via SUBCUTANEOUS
  Administered 2014-09-05 (×2): 2 [IU] via SUBCUTANEOUS
  Administered 2014-09-05: 1 [IU] via SUBCUTANEOUS
  Administered 2014-09-06: 2 [IU] via SUBCUTANEOUS
  Administered 2014-09-06: 1 [IU] via SUBCUTANEOUS
  Administered 2014-09-06 – 2014-09-08 (×4): 2 [IU] via SUBCUTANEOUS
  Administered 2014-09-08: 1 [IU] via SUBCUTANEOUS
  Administered 2014-09-09 – 2014-09-10 (×2): 2 [IU] via SUBCUTANEOUS
  Administered 2014-09-10 – 2014-09-11 (×2): 1 [IU] via SUBCUTANEOUS

## 2014-08-28 MED ORDER — NITROGLYCERIN 0.4 MG SL SUBL: 0.4000 mg | SUBLINGUAL_TABLET | SUBLINGUAL | Status: DC | PRN

## 2014-08-28 MED ORDER — NITROGLYCERIN 2 % TD OINT
1.0000 [in_us] | TOPICAL_OINTMENT | Freq: Four times a day (QID) | TRANSDERMAL | Status: DC
  Administered 2014-08-28 – 2014-08-29 (×2): 1 [in_us] via TOPICAL
  Filled 2014-08-28: qty 1
  Filled 2014-08-28: qty 30

## 2014-08-28 MED ORDER — MORPHINE SULFATE 2 MG/ML IJ SOLN
INTRAMUSCULAR | Status: AC
  Filled 2014-08-28: qty 1

## 2014-08-28 MED ORDER — CETYLPYRIDINIUM CHLORIDE 0.05 % MT LIQD
7.0000 mL | Freq: Two times a day (BID) | OROMUCOSAL | Status: DC
  Administered 2014-08-29 – 2014-09-12 (×26): 7 mL via OROMUCOSAL

## 2014-08-28 MED ORDER — ACETAMINOPHEN 325 MG PO TABS
650.0000 mg | ORAL_TABLET | ORAL | Status: DC | PRN
  Administered 2014-09-02 – 2014-09-11 (×3): 650 mg via ORAL
  Filled 2014-08-28 (×3): qty 2

## 2014-08-28 MED ORDER — INSULIN ASPART 100 UNIT/ML ~~LOC~~ SOLN
0.0000 [IU] | Freq: Every day | SUBCUTANEOUS | Status: DC
  Administered 2014-08-28: 2 [IU] via SUBCUTANEOUS

## 2014-08-28 MED ORDER — POLYETHYL GLYCOL-PROPYL GLYCOL 0.4-0.3 % OP SOLN: 1.0000 [drp] | Freq: Two times a day (BID) | OPHTHALMIC | Status: DC

## 2014-08-28 MED ORDER — IOHEXOL 350 MG/ML SOLN
80.0000 mL | Freq: Once | INTRAVENOUS | Status: AC | PRN
  Administered 2014-08-28: 80 mL via INTRAVENOUS

## 2014-08-28 MED ORDER — MORPHINE SULFATE 2 MG/ML IJ SOLN
1.0000 mg | INTRAMUSCULAR | Status: DC | PRN
  Administered 2014-08-28: 2 mg via INTRAVENOUS

## 2014-08-28 MED ORDER — CALCIUM CARBONATE 600 MG PO TABS: 600.0000 mg | ORAL_TABLET | Freq: Two times a day (BID) | ORAL | Status: DC

## 2014-08-28 MED ORDER — METOPROLOL TARTRATE 1 MG/ML IV SOLN
5.0000 mg | Freq: Once | INTRAVENOUS | Status: AC
  Administered 2014-08-28: 5 mg via INTRAVENOUS
  Filled 2014-08-28: qty 5

## 2014-08-28 MED ORDER — LABETALOL HCL 5 MG/ML IV SOLN: 40.0000 mg | Freq: Once | INTRAVENOUS | Status: DC

## 2014-08-28 MED ORDER — FELODIPINE ER 10 MG PO TB24
10.0000 mg | ORAL_TABLET | Freq: Every day | ORAL | Status: DC
  Administered 2014-08-28 – 2014-09-01 (×5): 10 mg via ORAL
  Filled 2014-08-28 (×7): qty 1

## 2014-08-28 MED ORDER — LABETALOL HCL 5 MG/ML IV SOLN
20.0000 mg | Freq: Once | INTRAVENOUS | Status: AC
Start: 2014-08-28 — End: 2014-08-28
  Administered 2014-08-28: 20 mg via INTRAVENOUS
  Filled 2014-08-28: qty 4

## 2014-08-28 NOTE — Progress Notes (Signed)
Pt has intramural hematoma with proximal origin( extent) just distal to but not involving the L [place] artery;  She also has a >5 cm thoracic aortic aneurysm.  Her BP remains elevated so will work on labetolol for now Have spoken with dr BB  He will see

## 2014-08-28 NOTE — ED Provider Notes (Signed)
CSN: [id number]     Arrival date & time 08/28/14  0753 History   First MD Initiated Contact with Patient 08/28/14 [phone]     Chief Complaint  Patient presents with  . Chest Pain     (Consider location/radiation/quality/duration/timing/severity/associated sxs/prior Treatment) HPI Comments: Patient presents to the ER for evaluation of chest pain. Patient reports that she got up and went to the bathroom, laid back down in bed and started to have pain in her chest. Patient reports that she had central pain that radiated into her back. Symptoms began approximately 4:30. Initially she and her husband thought it was something she ate. When the pain did not go way, EMS was called. Patient was administered nitroglycerin and morphine during transport, pain has completely resolved. She did not have nausea, vomiting, diaphoresis or shortness of breath associated with the symptoms.  Patient is a 80 y.o. female presenting with chest pain.  Chest Pain   Past Medical History  Diagnosis Date  . Diabetes mellitus without complication   . Hypertension   . AAA (abdominal aortic aneurysm)-repaired    Past Surgical History  Procedure Laterality Date  . Abdominal hysterectomy    . Abdominal aortic aneurysm repair     No family history on file. History  Substance Use Topics  . Smoking status: Never Smoker   . Smokeless tobacco: Not on file  . Alcohol Use: No   OB History   Grav Para Term Preterm Abortions TAB SAB Ect Mult Living                 Review of Systems  Cardiovascular: Positive for chest pain.  All other systems reviewed and are negative.     Allergies  Review of patient's allergies indicates no known allergies.  Home Medications   Prior to Admission medications   Medication Sig Start Date End Date Taking? Authorizing Provider  aspirin EC 81 MG tablet Take 81 mg by mouth daily.   Yes Historical Provider, MD  calcium carbonate (OS-CAL) 600 MG TABS tablet Take 600 mg by mouth 2  (two) times daily with a meal.   Yes Historical Provider, MD  felodipine (PLENDIL) 10 MG 24 hr tablet Take 10 mg by mouth daily.  08/07/14  Yes Historical Provider, MD  gemfibrozil (LOPID) 600 MG tablet Take 600 mg by mouth 2 (two) times daily before a meal.  07/28/14  Yes Historical Provider, MD  HYDROcodone-acetaminophen (NORCO/VICODIN) 5-325 MG per tablet Take 1 tablet by mouth every 6 (six) hours as needed (pain).  08/18/14  Yes Historical Provider, MD  metFORMIN (GLUCOPHAGE) 500 MG tablet Take 500 mg by mouth 2 (two) times daily with a meal.  07/28/14  Yes Historical Provider, MD  Polyethyl Glycol-Propyl Glycol (SYSTANE OP) Place 1 drop into both eyes 2 (two) times daily.   Yes Historical Provider, MD  Vitamin D, Ergocalciferol, (DRISDOL) 50000 UNITS CAPS capsule Take 50,000 Units by mouth every 7 (seven) days. Every saturday   Yes Historical Provider, MD   BP 173/93  Pulse 86  Temp(Src) 97.8 F (36.6 C) (Oral)  Resp 18  SpO2 94% Physical Exam  Constitutional: She is oriented to person, place, and time. She appears well-developed and well-nourished. No distress.  HENT:  Head: Normocephalic and atraumatic.  Right Ear: Hearing normal.  Left Ear: Hearing normal.  Nose: Nose normal.  Mouth/Throat: Oropharynx is clear and moist and mucous membranes are normal.  Eyes: Conjunctivae and EOM are normal. Pupils are equal, round, and reactive to light.  Neck: Normal range of motion. Neck supple.  Cardiovascular: Regular rhythm, S1 normal and S2 normal.  Exam reveals no gallop and no friction rub.   No murmur heard. Pulmonary/Chest: Effort normal and breath sounds normal. No respiratory distress. She exhibits no tenderness.  Abdominal: Soft. Normal appearance and bowel sounds are normal. There is no hepatosplenomegaly. There is no tenderness. There is no rebound, no guarding, no tenderness at McBurney's point and negative Murphy's sign. No hernia.  Musculoskeletal: Normal range of motion.   Neurological: She is alert and oriented to person, place, and time. She has normal strength. No cranial nerve deficit or sensory deficit. Coordination normal. GCS eye subscore is 4. GCS verbal subscore is 5. GCS motor subscore is 6.  Skin: Skin is warm, dry and intact. No rash noted. No cyanosis.  Psychiatric: She has a normal mood and affect. Her speech is normal and behavior is normal. Thought content normal.    ED Course  Procedures (including critical care time) Labs Review Labs Reviewed  CBC WITH DIFFERENTIAL - Abnormal; Notable for the following:    WBC 12.1 (*)    Neutrophils Relative % 82 (*)    Neutro Abs 9.9 (*)    All other components within normal limits  COMPREHENSIVE METABOLIC PANEL - Abnormal; Notable for the following:    Glucose, Bld 171 (*)    Calcium 10.7 (*)    Total Bilirubin 0.2 (*)    GFR calc non Af Amer 49 (*)    GFR calc Af Amer 57 (*)    All other components within normal limits  LIPASE, BLOOD  TROPONIN I  I-STAT TROPOININ, ED  I-STAT TROPOININ, ED    Imaging Review Ct Angio Chest W/cm &/or Wo Cm  08/28/2014   CLINICAL DATA:  Chest pain, back pain, history of abdominal aortic aneurysm, diabetes, hypertension  EXAM: CT ANGIOGRAPHY CHEST, ABDOMEN AND PELVIS  TECHNIQUE: Multidetector CT imaging through the chest, abdomen and pelvis was performed using the standard protocol during bolus administration of intravenous contrast. Multiplanar reconstructed images and MIPs were obtained and reviewed to evaluate the vascular anatomy.  CONTRAST:  80mL OMNIPAQUE IOHEXOL 350 MG/ML SOLN  COMPARISON:  06/22/2010  FINDINGS: CTA CHEST FINDINGS  Proximal descending thoracic aorta demonstrates wall thickening with hyperattenuation on the noncontrast phase beginning just distal to the left subclavian artery and extending throughout the descending thoracic aorta diffusely. This appearance is consistent with descending thoracic aortic acute intramural hemorrhage. Postcontrast,  there are 2 areas of wall enhancement within the thickened hyperdense wall, image 32 and 39 of series 5 suspicious for ongoing intramural hemorrhage versus areas of small ulcerated plaque formation. Also, there is a fusiform atherosclerotic descending thoracic aortic aneurysm measuring 5.1 x 5.2 cm, image 66 series 5. The transverse aorta and ascending aortic segments do not appear to be involved. Major branch vessels remain patent. No mediastinal hemorrhage or pericardial effusion. No pleural effusion. Negative for adenopathy. Mild enlargement of the inferior thyroid with scattered hypodense nodules.  Lung windows demonstrate respiratory motion artifact. Bibasilar minor atelectasis noted. Right middle lobe well-circumscribed 8 mm nodule again noted, stable compared to 06/22/2010 supporting a benign nodule. No focal pneumonia, collapse or consolidation. Negative for edema or pneumothorax. Trachea and central airways are patent. Coronary calcifications evident.  Review of the MIP images confirms the above findings.  CTA ABDOMEN AND PELVIS FINDINGS  Mild ectasia of the abdominal aorta diffusely with calcific atherosclerosis. Patient appears status post infrarenal aortic aneurysm repair. No significant recurrent abdominal aneurysm,  hemorrhage, retroperitoneal abnormality. 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 This can be seen with median arcuate ligament syndrome. SMA and main renal arteries are patent. 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  Tortuous atherosclerotic iliac vessels. Common, internal and external iliac arteries remain patent. No iliac aneurysm. Visualized common femoral, proximal profunda femoral and superficial femoral arteries are also patent.  Nonvascular: Stable small 12 mm hypodense cyst in the right hepatic dome. No biliary dilatation. Gallbladder, biliary system, pancreas, spleen, and  adrenal glands are within normal limits for age and arterial phase imaging.  Kidneys demonstrate cortical thinning/ scarring but normal enhancement and excretion. Scattered small renal cysts noted as before. No significant interval change.  Negative for bowel obstruction, dilatation, ileus, or free air. Diffuse colonic diverticulosis noted.  No abdominal free fluid, fluid collection, hemorrhage, abscess, or adenopathy.  Within the pelvis, there is a chronic sigmoid diverticulosis. Small incidental 2 cm left ovarian cyst, image 243 series 5. Urinary bladder unremarkable. Rectum is stool-filled. No pelvic free fluid, fluid collection, hemorrhage, abscess, adenopathy, inguinal abnormality, or hernia. Prior hysterectomy noted.  Bones are osteopenic. Diffuse degenerative changes of the spine and associated scoliosis.  Review of the MIP images confirms the above findings.  IMPRESSION: Acute intramural hemorrhage of the descending thoracic aorta with an associated mid descending thoracic aortic 5 cm aneurysm.  Abdominal aortic atherosclerotic changes, status post AAA repair.  Stable 8 mm right middle lobe well-circumscribed nodule compared to 2011 compatible with a benign nodule.  Stable hepatic and renal cysts  Bibasilar atelectasis  Colonic diverticulosis  No acute intra-abdominal or pelvic process  These results were called by telephone at the time of interpretation on 08/28/2014 at 2:23 pm to Dr. Jaci Carrel , who verbally acknowledged these results.   Electronically Signed   By: Ruel Favors M.D.   On: 08/28/2014 14:28   Dg Chest Port 1 View  08/28/2014   CLINICAL DATA:  Chest pain  EXAM: PORTABLE CHEST - 1 VIEW  COMPARISON:  07/31/2010  FINDINGS: Mild cardiomegaly with central vascular congestion. Tortuous atherosclerotic descending thoracic aorta. Chronic medial left lower lobe atelectasis along the aortic contour. No definite focal pneumonia, collapse or consolidation. No edema, effusion or pneumothorax.  Trachea midline.  IMPRESSION: Cardiomegaly with vascular congestion  Chronic left lower lobe atelectasis  Stable exam   Electronically Signed   By: Ruel Favors M.D.   On: 08/28/2014 09:53   Ct Angio Abd/pel W/ And/or W/o  08/28/2014   CLINICAL DATA:  Chest pain, back pain, history of abdominal aortic aneurysm, diabetes, hypertension  EXAM: CT ANGIOGRAPHY CHEST, ABDOMEN AND PELVIS  TECHNIQUE: Multidetector CT imaging through the chest, abdomen and pelvis was performed using the standard protocol during bolus administration of intravenous contrast. Multiplanar reconstructed images and MIPs were obtained and reviewed to evaluate the vascular anatomy.  CONTRAST:  80mL OMNIPAQUE IOHEXOL 350 MG/ML SOLN  COMPARISON:  06/22/2010  FINDINGS: CTA CHEST FINDINGS  Proximal descending thoracic aorta demonstrates wall thickening with hyperattenuation on the noncontrast phase beginning just distal to the left subclavian artery and extending throughout the descending thoracic aorta diffusely. This appearance is consistent with descending thoracic aortic acute intramural hemorrhage. Postcontrast, there are 2 areas of wall enhancement within the thickened hyperdense wall, image 32 and 39 of series 5 suspicious for ongoing intramural hemorrhage versus areas of small ulcerated plaque formation. Also, there is a fusiform atherosclerotic descending thoracic aortic aneurysm measuring  5.1 x 5.2 cm, image 66 series 5. The transverse aorta and ascending aortic segments do not appear to be involved. Major branch vessels remain patent. No mediastinal hemorrhage or pericardial effusion. No pleural effusion. Negative for adenopathy. Mild enlargement of the inferior thyroid with scattered hypodense nodules.  Lung windows demonstrate respiratory motion artifact. Bibasilar minor atelectasis noted. Right middle lobe well-circumscribed 8 mm nodule again noted, stable compared to 06/22/2010 supporting a benign nodule. No focal pneumonia, collapse  or consolidation. Negative for edema or pneumothorax. Trachea and central airways are patent. Coronary calcifications evident.  Review of the MIP images confirms the above findings.  CTA ABDOMEN AND PELVIS FINDINGS  Mild ectasia of the abdominal aorta diffusely with calcific atherosclerosis. Patient appears status post infrarenal aortic aneurysm repair. No significant recurrent abdominal aneurysm, hemorrhage, retroperitoneal abnormality. 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 This can be seen with median arcuate ligament syndrome. SMA and main renal arteries are patent. 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  Tortuous atherosclerotic iliac vessels. Common, internal and external iliac arteries remain patent. No iliac aneurysm. Visualized common femoral, proximal profunda femoral and superficial femoral arteries are also patent.  Nonvascular: Stable small 12 mm hypodense cyst in the right hepatic dome. No biliary dilatation. Gallbladder, biliary system, pancreas, spleen, and adrenal glands are within normal limits for age and arterial phase imaging.  Kidneys demonstrate cortical thinning/ scarring but normal enhancement and excretion. Scattered small renal cysts noted as before. No significant interval change.  Negative for bowel obstruction, dilatation, ileus, or free air. Diffuse colonic diverticulosis noted.  No abdominal free fluid, fluid collection, hemorrhage, abscess, or adenopathy.  Within the pelvis, there is a chronic sigmoid diverticulosis. Small incidental 2 cm left ovarian cyst, image 243 series 5. Urinary bladder unremarkable. Rectum is stool-filled. No pelvic free fluid, fluid collection, hemorrhage, abscess, adenopathy, inguinal abnormality, or hernia. Prior hysterectomy noted.  Bones are osteopenic. Diffuse degenerative changes of the spine and associated scoliosis.  Review of the MIP images  confirms the above findings.  IMPRESSION: Acute intramural hemorrhage of the descending thoracic aorta with an associated mid descending thoracic aortic 5 cm aneurysm.  Abdominal aortic atherosclerotic changes, status post AAA repair.  Stable 8 mm right middle lobe well-circumscribed nodule compared to 2011 compatible with a benign nodule.  Stable hepatic and renal cysts  Bibasilar atelectasis  Colonic diverticulosis  No acute intra-abdominal or pelvic process  These results were called by telephone at the time of interpretation on 08/28/2014 at 2:23 pm to Dr. Jaci Carrel , who verbally acknowledged these results.   Electronically Signed   By: Ruel Favors M.D.   On: 08/28/2014 14:28     EKG Interpretation   Date/Time:  Sunday August 28 2014 07:59:55 EDT Ventricular Rate:  98 PR Interval:  173 QRS Duration: 137 QT Interval:  401 QTC Calculation: 512 R Axis:   -85 Text Interpretation:  Sinus rhythm RBBB and LAFB Non-specific ST-t changes  No significant change since last tracing Confirmed by Trishia Cuthrell  MD,  Henretta Quist [id number]) on 08/28/2014 8:16:53 AM      MDM   Final diagnoses:  None  Thoracic Aortic Dissection  Patient presents to the ER for evaluation of chest pain. Patient initially indicated that she had onset of pain at 4:30 this morning that was resolved with nitroglycerin. She did, however, have some radiation to the back. Pain started to recur here in the ER and  did not get resolved with additional nitroglycerin. Patient is hypertensive. Reviewing her records reveals that she does have a history of aortic abdominal aneurysm repair. She is not experiencing any abdominal pain, however. Patient was sent to radiology for CT and jaw chest, abdomen, pelvis. This reveals a descending aortic dissection.  Patient initiated on IV labetalol for blood pressure control. Discussed with Doctor Graciela Husbands, cardiology.    Gilda Crease, MD 08/28/14 [phone]

## 2014-08-28 NOTE — ED Notes (Signed)
EMS- Pt began experiencing chest pain last night with radiation to back. She took  of ASA at home and was given one nitro tablet and  of Morphine by EMS. Pt is poor historian.

## 2014-08-28 NOTE — Consult Note (Signed)
We will be seeing this patient in consultation as at this point, NO clear cardiac cause  Will check serial troponin

## 2014-08-28 NOTE — Progress Notes (Signed)
Pt BP still elevated with a SBP in the 150s. Pt is also complaining of bad pain in her chest. Increased Labetalol to 3 and notfied MD. New orders for morphine were given. Gave pt  of morphine with no relief. Spoke with MD again and he increased morphine to 2-4 mg. Gave another 2 pt is asleep and BP has come down to the 130s. Will continue to assess and monitor the pt closely.

## 2014-08-28 NOTE — ED Notes (Signed)
Cardiology at bedside.

## 2014-08-28 NOTE — H&P (Addendum)
CARDIOLOGY CONSULT NOTE  Patient ID: Jennifer [name], MRN: [id number], DOB/AGE: 1925-07-01 80 y.o. Admit date: 08/28/2014 Date of Consult: 08/28/2014  Primary Physician: No primary provider on file. Primary Cardiologist:JV  Chief Complaint: chest pain   HPI Jennifer [name] is a 80 y.o. female  Who comes to ER with chest pain  this awakened her at 4 AM radiating through to her back. It was not crescendo. She tells me it has not abated all morning although, the ER physician says that it is resolved recurrently with nitroglycerin although not presently.  She has no known heart disease although she did see Dr. Hedy Jacob at [facility] about 3 years ago prior to a AAA repair. She has hypertension and diabetes. She does not smoke.   The pain is worse to some degree with sitting as well as deep breathing. She has a history of peptic ulcer disease. With recent complaints of dysphagia and odynophagia she underwent esophagogram which demonstrated moderate tertiary contractions.  She has noted no blood per rectum       Past Medical History  Diagnosis Date  . Diabetes mellitus without complication   . Hypertension       Surgical History:  Past Surgical History  Procedure Laterality Date  . Abdominal hysterectomy    . Abdominal aortic aneurysm repair       Home Meds: Prior to Admission medications   Medication Sig Start Date End Date Taking? Authorizing Provider  aspirin EC 81 MG tablet Take 81 mg by mouth daily.   Yes Historical Provider, MD  calcium carbonate (OS-CAL) 600 MG TABS tablet Take 600 mg by mouth 2 (two) times daily with a meal.   Yes Historical Provider, MD  felodipine (PLENDIL) 10 MG 24 hr tablet Take 10 mg by mouth daily.  08/07/14  Yes Historical Provider, MD  gemfibrozil (LOPID) 600 MG tablet Take 600 mg by mouth 2 (two) times daily before a meal.  07/28/14  Yes Historical Provider, MD  HYDROcodone-acetaminophen (NORCO/VICODIN) 5-325 MG per tablet Take 1 tablet by mouth every 6  (six) hours as needed (pain).  08/18/14  Yes Historical Provider, MD  metFORMIN (GLUCOPHAGE) 500 MG tablet Take 500 mg by mouth 2 (two) times daily with a meal.  07/28/14  Yes Historical Provider, MD  Polyethyl Glycol-Propyl Glycol (SYSTANE OP) Place 1 drop into both eyes 2 (two) times daily.   Yes Historical Provider, MD  Vitamin D, Ergocalciferol, (DRISDOL) 50000 UNITS CAPS capsule Take 50,000 Units by mouth every 7 (seven) days. Every saturday   Yes Historical Provider, MD    Inpatient Medications:  . nitroGLYCERIN  1 inch Topical 4 times per day    Allergies: No Known Allergies  History   Social History  . Marital Status: Married    Spouse Name: N/A    Number of Children: N/A  . Years of Education: N/A   Occupational History  . Not on file.   Social History Main Topics  . Smoking status: Never Smoker   . Smokeless tobacco: Not on file  . Alcohol Use: No  . Drug Use: Not on file  . Sexual Activity: Not on file   Other Topics Concern  . Not on file   Social History Narrative  . No narrative on file     No family history on file.   ROS:  Please see the history of present illness.     All other systems reviewed and negative.    Physical Exam:  Blood pressure 179/102, pulse 90, temperature 97.8 F (36.6 C), temperature source Oral, resp. rate 19, SpO2 96.00%.  blood pressures are equal bilaterally  General: Well developed, well nourished female in  modest discomfort  Head: Normocephalic, atraumatic, sclera non-icteric, no xanthomas, nares are without discharge. EENT: normal  Lymph Nodes:  none Neck: Negative for carotid bruits. JVD  7-8 cm Back:without scoliosis kyphosis  Lungs: Clear bilaterally to auscultation without wheezes, rales, or rhonchi. Breathing is unlabored. Heart: RRR with S1 S2. No * * murmur . No rubs, or gallops appreciated. Abdomen: Soft,  right upper quadrant tenderness  non-distended with normoactive bowel sounds. No hepatomegaly. No  rebound/guarding. No obvious abdominal masses. Msk:  Strength and tone appear normal for age. Extremities: No clubbing or cyanosis. No edema.  Distal pedal pulses are 2+ and equal bilaterally. Skin: Warm and Dry Neuro: Alert and oriented X 3. CN III-XII intact Grossly normal sensory and motor function . Psych:  Responds to questions appropriately with a normal affect.      Labs: Cardiac Enzymes  Recent Labs  08/28/14 0844  TROPONINI <0.30   CBC Lab Results  Component Value Date   WBC 12.1* 08/28/2014   HGB 12.0 08/28/2014   HCT 37.6 08/28/2014   MCV 91.9 08/28/2014   PLT 230 08/28/2014   PROTIME: No results found for this basename: LABPROT, INR,  in the last 72 hours Chemistry  Recent Labs Lab 08/28/14 0844  NA 139  K 3.9  CL 97  CO2 27  BUN 20  CREATININE 1.01  CALCIUM 10.7*  PROT 8.0  BILITOT 0.2*  ALKPHOS 50  ALT 11  AST 13  GLUCOSE 171*   Lipids No results found for this basename: CHOL, HDL, LDLCALC, TRIG   BNP No results found for this basename: probnp   Miscellaneous No results found for this basename: DDIMER    Radiology/Studies:  Dg Esophagus  08/10/2014   CLINICAL DATA:  Dysphagia  EXAM: ESOPHOGRAM/BARIUM SWALLOW  TECHNIQUE: Single contrast examination was performed using  thin barium.  FLUOROSCOPY TIME:  2 min 0 seconds  COMPARISON:  None.  FINDINGS: A single contrast barium swallow was performed. Rapid sequence spot films of the cervical esophagus show no significant abnormality. Very minimal indentation upon the right lateral aspect of the lower cervical esophagus could be due to prominent thyroid tissue. Moderate tertiary contractions are present in the mid and distal esophagus. No hiatal hernia is seen and no reflux is demonstrated. A barium pill was given at the end of the study, but the patient could not swallow the barium tablet.  IMPRESSION: 1. Moderate tertiary contractions in the mid and distal esophagus. 2. Slight indentation upon the lower  cervical esophagus from the right of midline could be due to prominent thyroid tissue. Correlate clinically. 3. No hiatal hernia or reflux is demonstrated. The barium pill could not be swallowed by the patient.   Electronically Signed   By: Dwyane Dee M.D.   On: 08/10/2014 12:14   Dg Chest Port 1 View  08/28/2014   CLINICAL DATA:  Chest pain  EXAM: PORTABLE CHEST - 1 VIEW  COMPARISON:  07/31/2010  FINDINGS: Mild cardiomegaly with central vascular congestion. Tortuous atherosclerotic descending thoracic aorta. Chronic medial left lower lobe atelectasis along the aortic contour. No definite focal pneumonia, collapse or consolidation. No edema, effusion or pneumothorax. Trachea midline.  IMPRESSION: Cardiomegaly with vascular congestion  Chronic left lower lobe atelectasis  Stable exam   Electronically Signed   By: Beryle Beams  Shick M.D.   On: 08/28/2014 09:53    EKG:   Sr 98 17/14/40 RBBB  LAFB No acute changes   Assessment and Plan: Chest pain-atypical  Hypertension  Peripheral vascular disease with prior repair of a AAA  Gastroesophageal reflux disease with history of ulcers   The patient has an unusual chest pain syndrome manifested by 1) no crescendo findings, 2) anterior-posterior. It occurs in the context of the ECG is unrevealing a four-hour troponin that is normal. I'm concerned, with her significant hypertension, and not withstanding bilaterally equal blood pressures, but this could represent an aortic dissection. She is currently being sent to imaging. There also image her abdominal aorta.  She is also tachycardic raising the possibility of pulmonary embolism. Will order a d-dimer; I am not sure whether they will be able to elucidate a pulmonary embolism with an aortic imaging study.  We will repeat her troponin at this point as she is now 8 hours into her syndrome. This may help guide our next diagnostic strategy. I would hold off on anticoagulation until we know that the CT scan is  negative for dissection    We will give her IV beta blockers.    Sherryl Manges

## 2014-08-29 ENCOUNTER — Encounter (HOSPITAL_COMMUNITY): Payer: Self-pay | Admitting: *Deleted

## 2014-08-29 DIAGNOSIS — I7100 Dissection of unspecified site of aorta: Secondary | ICD-10-CM

## 2014-08-29 DIAGNOSIS — I712 Thoracic aortic aneurysm, without rupture, unspecified: Secondary | ICD-10-CM

## 2014-08-29 LAB — HEMOGLOBIN A1C
Hgb A1c MFr Bld: 6.8 % — ABNORMAL HIGH (ref <5.7)
Mean Plasma Glucose: 148 mg/dL — ABNORMAL HIGH (ref <117)

## 2014-08-29 LAB — BASIC METABOLIC PANEL
ANION GAP: 18 — AB (ref 5–15)
BUN: 23 mg/dL (ref 6–23)
CO2: 24 meq/L (ref 19–32)
Calcium: 9.7 mg/dL (ref 8.4–10.5)
Chloride: 96 mEq/L (ref 96–112)
Creatinine, Ser: 1.17 mg/dL — ABNORMAL HIGH (ref 0.50–1.10)
GFR calc non Af Amer: 41 mL/min — ABNORMAL LOW (ref >90)
GFR, EST AFRICAN AMERICAN: 47 mL/min — AB (ref >90)
Glucose, Bld: 245 mg/dL — ABNORMAL HIGH (ref 70–99)
POTASSIUM: 4.1 meq/L (ref 3.7–5.3)
Sodium: 138 mEq/L (ref 137–147)

## 2014-08-29 LAB — GLUCOSE, CAPILLARY
GLUCOSE-CAPILLARY: 160 mg/dL — AB (ref 70–99)
GLUCOSE-CAPILLARY: 185 mg/dL — AB (ref 70–99)
Glucose-Capillary: 209 mg/dL — ABNORMAL HIGH (ref 70–99)
Glucose-Capillary: 167 mg/dL — ABNORMAL HIGH (ref 70–99)

## 2014-08-29 LAB — CBC
HCT: 35.2 % — ABNORMAL LOW (ref 36.0–46.0)
HEMOGLOBIN: 11.1 g/dL — AB (ref 12.0–15.0)
MCH: 29.2 pg (ref 26.0–34.0)
MCHC: 31.5 g/dL (ref 30.0–36.0)
MCV: 92.6 fL (ref 78.0–100.0)
Platelets: 212 10*3/uL (ref 150–400)
RBC: 3.8 MIL/uL — ABNORMAL LOW (ref 3.87–5.11)
RDW: 15.1 % (ref 11.5–15.5)
WBC: 9.5 10*3/uL (ref 4.0–10.5)

## 2014-08-29 LAB — LIPID PANEL
Cholesterol: 199 mg/dL (ref 0–200)
HDL: 33 mg/dL — ABNORMAL LOW (ref >39)
LDL Cholesterol: 99 mg/dL (ref 0–99)
TRIGLYCERIDES: 336 mg/dL — AB (ref <150)
Total CHOL/HDL Ratio: 6 RATIO
VLDL: 67 mg/dL — ABNORMAL HIGH (ref 0–40)

## 2014-08-29 LAB — T4, FREE: FREE T4: 0.9 ng/dL (ref 0.80–1.80)

## 2014-08-29 LAB — TROPONIN I: Troponin I: <0.3 ng/mL (ref <0.30)

## 2014-08-29 MED ORDER — METOPROLOL TARTRATE 25 MG PO TABS
25.0000 mg | ORAL_TABLET | Freq: Two times a day (BID) | ORAL | Status: DC
  Administered 2014-08-29 – 2014-08-30 (×4): 25 mg via ORAL
  Filled 2014-08-29 (×6): qty 1

## 2014-08-29 MED ORDER — METOPROLOL TARTRATE 1 MG/ML IV SOLN
5.0000 mg | Freq: Four times a day (QID) | INTRAVENOUS | Status: DC | PRN
  Administered 2014-08-29: 5 mg via INTRAVENOUS
  Filled 2014-08-29: qty 5

## 2014-08-29 NOTE — Consult Note (Signed)
[address]       [place] [address]             [phone]      Cardiothoracic Surgery Consultation   Reason for Consult: Descending thoracic aortic aneurysm and intramural hematoma Referring Physician: Virl Axe, MD  Alvin Critchley is an 80 y.o. female.  HPI:   The patient is an 80 year old woman with diabetes, hypertension and prior AAA repair for a 5.8 cm AAA in 2011 who woke up yesterday morning with new pain in her chest and back that did not improve. She came to the ER where her ECG was unremarkable and troponin was normal. She had a CTA which showed an intramural hematoma in the descending aorta extending from distal to the left subclavian to the distal descending thoracic aorta. There were 2 areas of wall enhancement within the thickened hyperdense wall suspicious for ongoing intramural hemorrhage or area of small ulcerated plaque formation. There was also a fusiform atherosclerotic aneurysm in the mid descending aorta with a maximum diameter of 5.2 cm. She was hypertensive on presentation at 179/102. Since her BP has been controlled her pain has resolved but she does report some anterior chest pain intermittently today.   Past Medical History  Diagnosis Date  . Diabetes mellitus without complication   . Hypertension   . AAA (abdominal aortic aneurysm)-repaired     Past Surgical History  Procedure Laterality Date  . Abdominal hysterectomy    . Abdominal aortic aneurysm repair      History reviewed. No pertinent family history.  Social History:  reports that she has never smoked. She does not have any smokeless tobacco history on file. She reports that she does not drink alcohol. Her drug history is not on file.  Allergies: No Known Allergies  Medications:  I have reviewed the patient's current medications. Prior to Admission:  Prescriptions prior to admission  Medication Sig Dispense Refill  . aspirin EC 81 MG tablet Take 81 mg by mouth  daily.      . calcium carbonate (OS-CAL) 600 MG TABS tablet Take 600 mg by mouth 2 (two) times daily with a meal.      . felodipine (PLENDIL) 10 MG 24 hr tablet Take 10 mg by mouth daily.       Marland Kitchen gemfibrozil (LOPID) 600 MG tablet Take 600 mg by mouth 2 (two) times daily before a meal.       . HYDROcodone-acetaminophen (NORCO/VICODIN) 5-325 MG per tablet Take 1 tablet by mouth every 6 (six) hours as needed (pain).       . metFORMIN (GLUCOPHAGE) 500 MG tablet Take 500 mg by mouth 2 (two) times daily with a meal.       . Polyethyl Glycol-Propyl Glycol (SYSTANE OP) Place 1 drop into both eyes 2 (two) times daily.      . Vitamin D, Ergocalciferol, (DRISDOL) 50000 UNITS CAPS capsule Take 50,000 Units by mouth every 7 (seven) days. Every saturday       Scheduled: . antiseptic oral rinse  7 mL Mouth Rinse BID  . calcium carbonate  1 tablet Oral BID WC  . felodipine  10 mg Oral Daily  . gemfibrozil  600 mg Oral BID AC  . insulin aspart  0-5 Units Subcutaneous QHS  . insulin aspart  0-9 Units Subcutaneous TID WC  . labetalol  40 mg Intravenous Once  . metoprolol tartrate  25 mg Oral BID  . polyvinyl  alcohol  1 drop Both Eyes BID   Continuous: . sodium chloride Stopped (08/29/14 1500)  . labetalol (NORMODYNE) infusion Stopped (08/29/14 0453)   ZYS:AYTKZSWFUXNAT, ALPRAZolam, HYDROcodone-acetaminophen, metoprolol, morphine injection, nitroGLYCERIN, ondansetron (ZOFRAN) IV  Results for orders placed during the hospital encounter of 08/28/14 (from the past 48 hour(s))  CBC WITH DIFFERENTIAL     Status: Abnormal   Collection Time    08/28/14  8:44 AM      Result Value Ref Range   WBC 12.1 (*) 4.0 - 10.5 K/uL   RBC 4.09  3.87 - 5.11 MIL/uL   Hemoglobin 12.0  12.0 - 15.0 g/dL   HCT 37.6  36.0 - 46.0 %   MCV 91.9  78.0 - 100.0 fL   MCH 29.3  26.0 - 34.0 pg   MCHC 31.9  30.0 - 36.0 g/dL   RDW 15.0  11.5 - 15.5 %   Platelets 230  150 - 400 K/uL   Neutrophils Relative % 82 (*) 43 - 77 %   Neutro  Abs 9.9 (*) 1.7 - 7.7 K/uL   Lymphocytes Relative 13  12 - 46 %   Lymphs Abs 1.6  0.7 - 4.0 K/uL   Monocytes Relative 5  3 - 12 %   Monocytes Absolute 0.6  0.1 - 1.0 K/uL   Eosinophils Relative 0  0 - 5 %   Eosinophils Absolute 0.0  0.0 - 0.7 K/uL   Basophils Relative 0  0 - 1 %   Basophils Absolute 0.0  0.0 - 0.1 K/uL  COMPREHENSIVE METABOLIC PANEL     Status: Abnormal   Collection Time    08/28/14  8:44 AM      Result Value Ref Range   Sodium 139  137 - 147 mEq/L   Potassium 3.9  3.7 - 5.3 mEq/L   Chloride 97  96 - 112 mEq/L   CO2 27  19 - 32 mEq/L   Glucose, Bld 171 (*) 70 - 99 mg/dL   BUN 20  6 - 23 mg/dL   Creatinine, Ser 1.01  0.50 - 1.10 mg/dL   Calcium 10.7 (*) 8.4 - 10.5 mg/dL   Total Protein 8.0  6.0 - 8.3 g/dL   Albumin 4.2  3.5 - 5.2 g/dL   AST 13  0 - 37 U/L   ALT 11  0 - 35 U/L   Alkaline Phosphatase 50  39 - 117 U/L   Total Bilirubin 0.2 (*) 0.3 - 1.2 mg/dL   GFR calc non Af Amer 49 (*) >90 mL/min   GFR calc Af Amer 57 (*) >90 mL/min   Comment: (NOTE)     The eGFR has been calculated using the CKD EPI equation.     This calculation has not been validated in all clinical situations.     eGFR's persistently <90 mL/min signify possible Chronic Kidney     Disease.   Anion gap 15  5 - 15  LIPASE, BLOOD     Status: None   Collection Time    08/28/14  8:44 AM      Result Value Ref Range   Lipase 42  11 - 59 U/L  TROPONIN I     Status: None   Collection Time    08/28/14  8:44 AM      Result Value Ref Range   Troponin I <0.30  <0.30 ng/mL   Comment:            Due to the release kinetics of  cTnI,     a negative result within the first hours     of the onset of symptoms does not rule out     myocardial infarction with certainty.     If myocardial infarction is still suspected,     repeat the test at appropriate intervals.  Randolm Idol, ED     Status: None   Collection Time    08/28/14  1:05 PM      Result Value Ref Range   Troponin i, poc 0.02  0.00 -  0.08 ng/mL   Comment 3            Comment: Due to the release kinetics of cTnI,     a negative result within the first hours     of the onset of symptoms does not rule out     myocardial infarction with certainty.     If myocardial infarction is still suspected,     repeat the test at appropriate intervals.  Randolm Idol, ED     Status: None   Collection Time    08/28/14  3:41 PM      Result Value Ref Range   Troponin i, poc 0.01  0.00 - 0.08 ng/mL   Comment 3            Comment: Due to the release kinetics of cTnI,     a negative result within the first hours     of the onset of symptoms does not rule out     myocardial infarction with certainty.     If myocardial infarction is still suspected,     repeat the test at appropriate intervals.  GLUCOSE, CAPILLARY     Status: Abnormal   Collection Time    08/28/14  5:45 PM      Result Value Ref Range   Glucose-Capillary 160 (*) 70 - 99 mg/dL  MRSA PCR SCREENING     Status: None   Collection Time    08/28/14  6:03 PM      Result Value Ref Range   MRSA by PCR NEGATIVE  NEGATIVE   Comment:            The GeneXpert MRSA Assay (FDA     approved for NASAL specimens     only), is one component of a     comprehensive MRSA colonization     surveillance program. It is not     intended to diagnose MRSA     infection nor to guide or     monitor treatment for     MRSA infections.  MAGNESIUM     Status: None   Collection Time    08/28/14  7:35 PM      Result Value Ref Range   Magnesium 1.7  1.5 - 2.5 mg/dL  TSH     Status: None   Collection Time    08/28/14  7:35 PM      Result Value Ref Range   TSH 1.280  0.350 - 4.500 uIU/mL  T4, FREE     Status: None   Collection Time    08/28/14  7:35 PM      Result Value Ref Range   Free T4 0.90  0.80 - 1.80 ng/dL   Comment: Performed at [facility]  TROPONIN I     Status: None   Collection Time    08/28/14  7:35 PM      Result Value Ref Range   Troponin I <0.30  <  0.30  ng/mL   Comment:            Due to the release kinetics of cTnI,     a negative result within the first hours     of the onset of symptoms does not rule out     myocardial infarction with certainty.     If myocardial infarction is still suspected,     repeat the test at appropriate intervals.  HEMOGLOBIN A1C     Status: Abnormal   Collection Time    08/28/14  7:35 PM      Result Value Ref Range   Hemoglobin A1C 6.8 (*) <5.7 %   Comment: (NOTE)                                                                               According to the ADA Clinical Practice Recommendations for 2011, when     HbA1c is used as a screening test:      >=6.5%   Diagnostic of Diabetes Mellitus               (if abnormal result is confirmed)     5.7-6.4%   Increased risk of developing Diabetes Mellitus     References:Diagnosis and Classification of Diabetes Mellitus,Diabetes     WUJW,1191,47(WGNFA 1):S62-S69 and Standards of Medical Care in             Diabetes - 2011,Diabetes Care,2011,34 (Suppl 1):S11-S61.   Mean Plasma Glucose 148 (*) <117 mg/dL   Comment: Performed at [facility]     Status: None   Collection Time    08/28/14  7:35 PM      Result Value Ref Range   Prothrombin Time 13.5  11.6 - 15.2 seconds   INR 1.03  0.00 - 1.49  APTT     Status: None   Collection Time    08/28/14  7:35 PM      Result Value Ref Range   aPTT 28  24 - 37 seconds  GLUCOSE, CAPILLARY     Status: Abnormal   Collection Time    08/28/14  9:15 PM      Result Value Ref Range   Glucose-Capillary 204 (*) 70 - 99 mg/dL   Comment 1 Capillary Sample    TROPONIN I     Status: None   Collection Time    08/29/14 12:25 AM      Result Value Ref Range   Troponin I <0.30  <0.30 ng/mL   Comment:            Due to the release kinetics of cTnI,     a negative result within the first hours     of the onset of symptoms does not rule out     myocardial infarction with certainty.     If myocardial  infarction is still suspected,     repeat the test at appropriate intervals.  BASIC METABOLIC PANEL     Status: Abnormal   Collection Time    08/29/14 12:25 AM      Result Value Ref Range   Sodium 138  137 - 147 mEq/L   Potassium 4.1  3.7 -  5.3 mEq/L   Chloride 96  96 - 112 mEq/L   CO2 24  19 - 32 mEq/L   Glucose, Bld 245 (*) 70 - 99 mg/dL   BUN 23  6 - 23 mg/dL   Creatinine, Ser 1.17 (*) 0.50 - 1.10 mg/dL   Calcium 9.7  8.4 - 10.5 mg/dL   GFR calc non Af Amer 41 (*) >90 mL/min   GFR calc Af Amer 47 (*) >90 mL/min   Comment: (NOTE)     The eGFR has been calculated using the CKD EPI equation.     This calculation has not been validated in all clinical situations.     eGFR's persistently <90 mL/min signify possible Chronic Kidney     Disease.   Anion gap 18 (*) 5 - 15  CBC     Status: Abnormal   Collection Time    08/29/14 12:25 AM      Result Value Ref Range   WBC 9.5  4.0 - 10.5 K/uL   RBC 3.80 (*) 3.87 - 5.11 MIL/uL   Hemoglobin 11.1 (*) 12.0 - 15.0 g/dL   HCT 35.2 (*) 36.0 - 46.0 %   MCV 92.6  78.0 - 100.0 fL   MCH 29.2  26.0 - 34.0 pg   MCHC 31.5  30.0 - 36.0 g/dL   RDW 15.1  11.5 - 15.5 %   Platelets 212  150 - 400 K/uL  LIPID PANEL     Status: Abnormal   Collection Time    08/29/14 12:25 AM      Result Value Ref Range   Cholesterol 199  0 - 200 mg/dL   Triglycerides 336 (*) <150 mg/dL   HDL 33 (*) >39 mg/dL   Total CHOL/HDL Ratio 6.0     VLDL 67 (*) 0 - 40 mg/dL   LDL Cholesterol 99  0 - 99 mg/dL   Comment:            Total Cholesterol/HDL:CHD Risk     Coronary Heart Disease Risk Table                         Men   Women      1/2 Average Risk   3.4   3.3      Average Risk       5.0   4.4      2 X Average Risk   9.6   7.1      3 X Average Risk  23.4   11.0                Use the calculated Patient Ratio     above and the CHD Risk Table     to determine the patient's CHD Risk.                ATP III CLASSIFICATION (LDL):      <100     mg/dL   Optimal       100-129  mg/dL   Near or Above                        Optimal      130-159  mg/dL   Borderline      160-189  mg/dL   High      >190     mg/dL   Very High  TROPONIN I     Status: None   Collection Time  08/29/14  5:48 AM      Result Value Ref Range   Troponin I <0.30  <0.30 ng/mL   Comment:            Due to the release kinetics of cTnI,     a negative result within the first hours     of the onset of symptoms does not rule out     myocardial infarction with certainty.     If myocardial infarction is still suspected,     repeat the test at appropriate intervals.  GLUCOSE, CAPILLARY     Status: Abnormal   Collection Time    08/29/14  7:35 AM      Result Value Ref Range   Glucose-Capillary 209 (*) 70 - 99 mg/dL  GLUCOSE, CAPILLARY     Status: Abnormal   Collection Time    08/29/14 11:15 AM      Result Value Ref Range   Glucose-Capillary 167 (*) 70 - 99 mg/dL   Comment 1 Capillary Sample    GLUCOSE, CAPILLARY     Status: Abnormal   Collection Time    08/29/14  4:10 PM      Result Value Ref Range   Glucose-Capillary 160 (*) 70 - 99 mg/dL   Comment 1 Capillary Sample      Ct Angio Chest W/cm &/or Wo Cm  08/28/2014   CLINICAL DATA:  Chest pain, back pain, history of abdominal aortic aneurysm, diabetes, hypertension  EXAM: CT ANGIOGRAPHY CHEST, ABDOMEN AND PELVIS  TECHNIQUE: Multidetector CT imaging through the chest, abdomen and pelvis was performed using the standard protocol during bolus administration of intravenous contrast. Multiplanar reconstructed images and MIPs were obtained and reviewed to evaluate the vascular anatomy.  CONTRAST:  56m OMNIPAQUE IOHEXOL 350 MG/ML SOLN  COMPARISON:  06/22/2010  FINDINGS: CTA CHEST FINDINGS  Proximal descending thoracic aorta demonstrates wall thickening with hyperattenuation on the noncontrast phase beginning just distal to the left subclavian artery and extending throughout the descending thoracic aorta diffusely. This appearance is  consistent with descending thoracic aortic acute intramural hemorrhage. Postcontrast, there are 2 areas of wall enhancement within the thickened hyperdense wall, image 32 and 39 of series 5 suspicious for ongoing intramural hemorrhage versus areas of small ulcerated plaque formation. Also, there is a fusiform atherosclerotic descending thoracic aortic aneurysm measuring 5.1 x 5.2 cm, image 66 series 5. The transverse aorta and ascending aortic segments do not appear to be involved. Major branch vessels remain patent. No mediastinal hemorrhage or pericardial effusion. No pleural effusion. Negative for adenopathy. Mild enlargement of the inferior thyroid with scattered hypodense nodules.  Lung windows demonstrate respiratory motion artifact. Bibasilar minor atelectasis noted. Right middle lobe well-circumscribed 8 mm nodule again noted, stable compared to 06/22/2010 supporting a benign nodule. No focal pneumonia, collapse or consolidation. Negative for edema or pneumothorax. Trachea and central airways are patent. Coronary calcifications evident.  Review of the MIP images confirms the above findings.  CTA ABDOMEN AND PELVIS FINDINGS  Mild ectasia of the abdominal aorta diffusely with calcific atherosclerosis. Patient appears status post infrarenal aortic aneurysm repair. No significant recurrent abdominal aneurysm, hemorrhage, retroperitoneal abnormality. 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 This can be seen with median arcuate ligament syndrome. SMA and main renal arteries are patent. 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  Tortuous atherosclerotic iliac vessels. Common, internal and external iliac arteries remain patent. No iliac aneurysm. Visualized common femoral,  proximal profunda femoral and superficial femoral arteries are also patent.  Nonvascular: Stable small 12 mm hypodense cyst in the right  hepatic dome. No biliary dilatation. Gallbladder, biliary system, pancreas, spleen, and adrenal glands are within normal limits for age and arterial phase imaging.  Kidneys demonstrate cortical thinning/ scarring but normal enhancement and excretion. Scattered small renal cysts noted as before. No significant interval change.  Negative for bowel obstruction, dilatation, ileus, or free air. Diffuse colonic diverticulosis noted.  No abdominal free fluid, fluid collection, hemorrhage, abscess, or adenopathy.  Within the pelvis, there is a chronic sigmoid diverticulosis. Small incidental 2 cm left ovarian cyst, image 243 series 5. Urinary bladder unremarkable. Rectum is stool-filled. No pelvic free fluid, fluid collection, hemorrhage, abscess, adenopathy, inguinal abnormality, or hernia. Prior hysterectomy noted.  Bones are osteopenic. Diffuse degenerative changes of the spine and associated scoliosis.  Review of the MIP images confirms the above findings.  IMPRESSION: Acute intramural hemorrhage of the descending thoracic aorta with an associated mid descending thoracic aortic 5 cm aneurysm.  Abdominal aortic atherosclerotic changes, status post AAA repair.  Stable 8 mm right middle lobe well-circumscribed nodule compared to 2011 compatible with a benign nodule.  Stable hepatic and renal cysts  Bibasilar atelectasis  Colonic diverticulosis  No acute intra-abdominal or pelvic process  These results were called by telephone at the time of interpretation on 08/28/2014 at 2:23 pm to Dr. Joseph Berkshire , who verbally acknowledged these results.   Electronically Signed   By: Daryll Brod M.D.   On: 08/28/2014 14:28   Dg Chest Port 1 View  08/28/2014   CLINICAL DATA:  Chest pain  EXAM: PORTABLE CHEST - 1 VIEW  COMPARISON:  07/31/2010  FINDINGS: Mild cardiomegaly with central vascular congestion. Tortuous atherosclerotic descending thoracic aorta. Chronic medial left lower lobe atelectasis along the aortic contour. No  definite focal pneumonia, collapse or consolidation. No edema, effusion or pneumothorax. Trachea midline.  IMPRESSION: Cardiomegaly with vascular congestion  Chronic left lower lobe atelectasis  Stable exam   Electronically Signed   By: Daryll Brod M.D.   On: 08/28/2014 09:53   Ct Angio Abd/pel W/ And/or W/o  08/28/2014   CLINICAL DATA:  Chest pain, back pain, history of abdominal aortic aneurysm, diabetes, hypertension  EXAM: CT ANGIOGRAPHY CHEST, ABDOMEN AND PELVIS  TECHNIQUE: Multidetector CT imaging through the chest, abdomen and pelvis was performed using the standard protocol during bolus administration of intravenous contrast. Multiplanar reconstructed images and MIPs were obtained and reviewed to evaluate the vascular anatomy.  CONTRAST:  63m OMNIPAQUE IOHEXOL 350 MG/ML SOLN  COMPARISON:  06/22/2010  FINDINGS: CTA CHEST FINDINGS  Proximal descending thoracic aorta demonstrates wall thickening with hyperattenuation on the noncontrast phase beginning just distal to the left subclavian artery and extending throughout the descending thoracic aorta diffusely. This appearance is consistent with descending thoracic aortic acute intramural hemorrhage. Postcontrast, there are 2 areas of wall enhancement within the thickened hyperdense wall, image 32 and 39 of series 5 suspicious for ongoing intramural hemorrhage versus areas of small ulcerated plaque formation. Also, there is a fusiform atherosclerotic descending thoracic aortic aneurysm measuring 5.1 x 5.2 cm, image 66 series 5. The transverse aorta and ascending aortic segments do not appear to be involved. Major branch vessels remain patent. No mediastinal hemorrhage or pericardial effusion. No pleural effusion. Negative for adenopathy. Mild enlargement of the inferior thyroid with scattered hypodense nodules.  Lung windows demonstrate respiratory motion artifact. Bibasilar minor atelectasis noted. Right middle lobe well-circumscribed 8 mm  nodule again  noted, stable compared to 06/22/2010 supporting a benign nodule. No focal pneumonia, collapse or consolidation. Negative for edema or pneumothorax. Trachea and central airways are patent. Coronary calcifications evident.  Review of the MIP images confirms the above findings.  CTA ABDOMEN AND PELVIS FINDINGS  Mild ectasia of the abdominal aorta diffusely with calcific atherosclerosis. Patient appears status post infrarenal aortic aneurysm repair. No significant recurrent abdominal aneurysm, hemorrhage, retroperitoneal abnormality. 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 This can be seen with median arcuate ligament syndrome. SMA and main renal arteries are patent. 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  Tortuous atherosclerotic iliac vessels. Common, internal and external iliac arteries remain patent. No iliac aneurysm. Visualized common femoral, proximal profunda femoral and superficial femoral arteries are also patent.  Nonvascular: Stable small 12 mm hypodense cyst in the right hepatic dome. No biliary dilatation. Gallbladder, biliary system, pancreas, spleen, and adrenal glands are within normal limits for age and arterial phase imaging.  Kidneys demonstrate cortical thinning/ scarring but normal enhancement and excretion. Scattered small renal cysts noted as before. No significant interval change.  Negative for bowel obstruction, dilatation, ileus, or free air. Diffuse colonic diverticulosis noted.  No abdominal free fluid, fluid collection, hemorrhage, abscess, or adenopathy.  Within the pelvis, there is a chronic sigmoid diverticulosis. Small incidental 2 cm left ovarian cyst, image 243 series 5. Urinary bladder unremarkable. Rectum is stool-filled. No pelvic free fluid, fluid collection, hemorrhage, abscess, adenopathy, inguinal abnormality, or hernia. Prior hysterectomy noted.  Bones are osteopenic.  Diffuse degenerative changes of the spine and associated scoliosis.  Review of the MIP images confirms the above findings.  IMPRESSION: Acute intramural hemorrhage of the descending thoracic aorta with an associated mid descending thoracic aortic 5 cm aneurysm.  Abdominal aortic atherosclerotic changes, status post AAA repair.  Stable 8 mm right middle lobe well-circumscribed nodule compared to 2011 compatible with a benign nodule.  Stable hepatic and renal cysts  Bibasilar atelectasis  Colonic diverticulosis  No acute intra-abdominal or pelvic process  These results were called by telephone at the time of interpretation on 08/28/2014 at 2:23 pm to Dr. Joseph Berkshire , who verbally acknowledged these results.   Electronically Signed   By: Daryll Brod M.D.   On: 08/28/2014 14:28    Review of Systems  Constitutional: Positive for malaise/fatigue. Negative for fever, chills, weight loss and diaphoresis.       Says she sleeps a lot during the day.  HENT: Negative.   Eyes: Negative.   Respiratory: Negative for shortness of breath.   Cardiovascular: Positive for chest pain. Negative for palpitations, orthopnea, claudication, leg swelling and PND.  Gastrointestinal: Negative.   Genitourinary: Negative.   Musculoskeletal: Negative.   Skin: Negative.   Neurological: Negative.  Negative for weakness.  Endo/Heme/Allergies: Negative.   Psychiatric/Behavioral: Negative.    Blood pressure 106/52, pulse 66, temperature 98.5 F (36.9 C), temperature source Oral, resp. rate 17, height 5' 3"  (1.6 m), weight 69.6 kg (153 lb 7 oz), SpO2 94.00%. Physical Exam  Constitutional: She is oriented to person, place, and time. She appears well-developed and well-nourished. No distress.  HENT:  Head: Normocephalic and atraumatic.  Mouth/Throat: Oropharynx is clear and moist.  Eyes: EOM are normal. Pupils are equal, round, and reactive to light.  Neck: Normal range of motion. Neck supple. No JVD present. No  thyromegaly present.  Cardiovascular: Normal rate, regular rhythm, normal heart sounds  and intact distal pulses.   No murmur heard. Respiratory: Effort normal and breath sounds normal. No respiratory distress. She has no rales.  GI: Soft. Bowel sounds are normal. She exhibits no distension and no mass. There is no tenderness.  Midline abdominal scar  Musculoskeletal: Normal range of motion. She exhibits no edema.  Lymphadenopathy:    She has no cervical adenopathy.  Neurological: She is alert and oriented to person, place, and time. She has normal strength. No cranial nerve deficit or sensory deficit.  Skin: Skin is warm and dry.  Psychiatric: She has a normal mood and affect.   CLINICAL DATA: Chest pain, back pain, history of abdominal aortic  aneurysm, diabetes, hypertension  EXAM:  CT ANGIOGRAPHY CHEST, ABDOMEN AND PELVIS  TECHNIQUE:  Multidetector CT imaging through the chest, abdomen and pelvis was  performed using the standard protocol during bolus administration of  intravenous contrast. Multiplanar reconstructed images and MIPs were  obtained and reviewed to evaluate the vascular anatomy.  CONTRAST: 3m OMNIPAQUE IOHEXOL 350 MG/ML SOLN  COMPARISON: 06/22/2010  FINDINGS:  CTA CHEST FINDINGS  Proximal descending thoracic aorta demonstrates wall thickening with  hyperattenuation on the noncontrast phase beginning just distal to  the left subclavian artery and extending throughout the descending  thoracic aorta diffusely. This appearance is consistent with  descending thoracic aortic acute intramural hemorrhage.  Postcontrast, there are 2 areas of wall enhancement within the  thickened hyperdense wall, image 32 and 39 of series 5 suspicious  for ongoing intramural hemorrhage versus areas of small ulcerated  plaque formation. Also, there is a fusiform atherosclerotic  descending thoracic aortic aneurysm measuring 5.1 x 5.2 cm, image 66  series 5. The transverse aorta and  ascending aortic segments do not  appear to be involved. Major branch vessels remain patent. No  mediastinal hemorrhage or pericardial effusion. No pleural effusion.  Negative for adenopathy. Mild enlargement of the inferior thyroid  with scattered hypodense nodules.  Lung windows demonstrate respiratory motion artifact. Bibasilar  minor atelectasis noted. Right middle lobe well-circumscribed 8 mm  nodule again noted, stable compared to 06/22/2010 supporting a  benign nodule. No focal pneumonia, collapse or consolidation.  Negative for edema or pneumothorax. Trachea and central airways are  patent. Coronary calcifications evident.  Review of the MIP images confirms the above findings.  CTA ABDOMEN AND PELVIS FINDINGS  Mild ectasia of the abdominal aorta diffusely with calcific  atherosclerosis. Patient appears status post infrarenal aortic  aneurysm repair. No significant recurrent abdominal aneurysm,  hemorrhage, retroperitoneal abnormality. 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 SMA  and main renal arteries are patent. 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  Tortuous atherosclerotic iliac vessels. Common, internal and  external iliac arteries remain patent. No iliac aneurysm. Visualized  common femoral, proximal profunda femoral and superficial femoral  arteries are also patent.  Nonvascular: Stable small 12 mm hypodense cyst in the right hepatic  dome. No biliary dilatation. Gallbladder, biliary system, pancreas,  spleen, and adrenal glands are within normal limits for age and  arterial phase imaging.  Kidneys demonstrate cortical thinning/ scarring but normal  enhancement and excretion. Scattered small renal cysts noted as  before. No significant interval change.  Negative for bowel obstruction, dilatation, ileus,  or free air.  Diffuse colonic diverticulosis noted.  No abdominal free fluid, fluid collection, hemorrhage, abscess, or  adenopathy.  Within the pelvis, there is a chronic sigmoid diverticulosis. Small  incidental 2 cm left ovarian cyst, image 243 series 5. Urinary  bladder unremarkable. Rectum is stool-filled. No pelvic free fluid,  fluid collection, hemorrhage, abscess, adenopathy, inguinal  abnormality, or hernia. Prior hysterectomy noted.  Bones are osteopenic. Diffuse degenerative changes of the spine and  associated scoliosis.  Review of the MIP images confirms the above findings.  IMPRESSION:  Acute intramural hemorrhage of the descending thoracic aorta with an  associated mid descending thoracic aortic 5 cm aneurysm.  Abdominal aortic atherosclerotic changes, status post AAA repair.  Stable 8 mm right middle lobe well-circumscribed nodule compared to  2011 compatible with a benign nodule.  Stable hepatic and renal cysts  Bibasilar atelectasis  Colonic diverticulosis  No acute intra-abdominal or pelvic process  These results were called by telephone at the time of interpretation  on 08/28/2014 at 2:23 pm to Dr. Joseph Berkshire , who verbally  acknowledged these results.  Electronically Signed  By: Daryll Brod M.D.  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 This is a variant of aortic dissection and since it only involves the descending aorta the initial treatment is tight blood pressure control. She does have a significant aneurysm that may be treated with TEVAR. I will discuss the case with the aortic team and decide on optimal treatment and followup. I am concerned that she could have significant coronary disease given her symptoms of marked tiredness and fatigue in addition to extensive calcified plaque in the left main, proximal LAD and LCX.  Gaye Pollack 08/29/2014, 8:34 PM

## 2014-08-29 NOTE — Progress Notes (Signed)
Utilization Review Completed.Jennifer [name] T9/28/2015  

## 2014-08-29 NOTE — Progress Notes (Signed)
    Subjective:  Denies dyspnea; mild residual CP but much improved.   Objective:  Filed Vitals:   08/29/14 0530 08/29/14 0615 08/29/14 0700 08/29/14 0732  BP: 105/46 111/59 113/60 113/60  Pulse: 65 65 66 68  Temp:    97.5 F (36.4 C)  TempSrc:    Oral  Resp: Height:      Weight:      SpO2: 92% 94% 91% 93%    Intake/Output from previous day:  Intake/Output Summary (Last 24 hours) at 08/29/14 0828 Last data filed at 08/29/14 0700  Gross per 24 hour  Intake 495.01 ml  Output     55 ml  Net 440.01 ml    Physical Exam: Physical exam: Well-developed well-nourished in no acute distress.  Skin is warm and dry.  HEENT is normal.  Neck is supple.  Chest is clear to auscultation with normal expansion.  Cardiovascular exam is regular rate and rhythm.  Abdominal exam nontender or distended. No masses palpated. Extremities show no edema. neuro grossly intact    Lab Results: Basic Metabolic Panel:  Recent Labs  78/46/96 0844 08/28/14 1935 08/29/14 0025  NA 139  --  138  K 3.9  --  4.1  CL 97  --  96  CO2 27  --  24  GLUCOSE 171*  --  245*  BUN 20  --  23  CREATININE 1.01  --  1.17*  CALCIUM 10.7*  --  9.7  MG  --  1.7  --    CBC:  Recent Labs  08/28/14 0844 08/29/14 0025  WBC 12.1* 9.5  NEUTROABS 9.9*  --   HGB 12.0 11.1*  HCT 37.6 35.2*  MCV 91.9 92.6  PLT 230 212   Cardiac Enzymes:  Recent Labs  08/28/14 1935 08/29/14 0025 08/29/14 0548  TROPONINI <0.30 <0.30 <0.30     Assessment/Plan:  1 thoracic aortic aneurysm with intramural hematoma involving descending thoracic aorta-Patient improved symptomatically. Continue labetalol for hypertension. Await CVTS input; given that a ascending aorta is not involved we may be able to treat medically. However I would be concerned about risk of rupture given size of aneurysm in association with intramural hematoma. May be able to consider stent graft. 2 hypertension-and she is scheduled to be  transitioned to [place] this morning. Follow blood pressure closely and add additional medications if systolic increases. 3 diabetes mellitus-follow CBGs. 4 hyperlipidemia-Continue present meds.   Olga Millers 08/29/2014, 8:28 AM

## 2014-08-29 NOTE — Progress Notes (Signed)
Patient's SBP sustaining in 140-150's.  PA notified and new orders received for prn metoprolol.  Will continue to monitor. [place], Mitzi Hansen

## 2014-08-30 ENCOUNTER — Encounter (HOSPITAL_COMMUNITY): Payer: Self-pay

## 2014-08-30 DIAGNOSIS — I359 Nonrheumatic aortic valve disorder, unspecified: Secondary | ICD-10-CM

## 2014-08-30 LAB — GLUCOSE, CAPILLARY
Glucose-Capillary: 160 mg/dL — ABNORMAL HIGH (ref 70–99)
Glucose-Capillary: 143 mg/dL — ABNORMAL HIGH (ref 70–99)
Glucose-Capillary: 160 mg/dL — ABNORMAL HIGH (ref 70–99)
Glucose-Capillary: 189 mg/dL — ABNORMAL HIGH (ref 70–99)

## 2014-08-30 LAB — CBC
HCT: 32.2 % — ABNORMAL LOW (ref 36.0–46.0)
Hemoglobin: 10.1 g/dL — ABNORMAL LOW (ref 12.0–15.0)
MCH: 28.3 pg (ref 26.0–34.0)
MCHC: 31.4 g/dL (ref 30.0–36.0)
MCV: 90.2 fL (ref 78.0–100.0)
PLATELETS: 205 10*3/uL (ref 150–400)
RBC: 3.57 MIL/uL — AB (ref 3.87–5.11)
RDW: 15.4 % (ref 11.5–15.5)
WBC: 9.9 10*3/uL (ref 4.0–10.5)

## 2014-08-30 LAB — BASIC METABOLIC PANEL
Anion gap: 16 — ABNORMAL HIGH (ref 5–15)
BUN: 32 mg/dL — ABNORMAL HIGH (ref 6–23)
CO2: 24 meq/L (ref 19–32)
Calcium: 9.6 mg/dL (ref 8.4–10.5)
Chloride: 97 mEq/L (ref 96–112)
Creatinine, Ser: 1.2 mg/dL — ABNORMAL HIGH (ref 0.50–1.10)
GFR calc non Af Amer: 40 mL/min — ABNORMAL LOW (ref >90)
GFR, EST AFRICAN AMERICAN: 46 mL/min — AB (ref >90)
Glucose, Bld: 155 mg/dL — ABNORMAL HIGH (ref 70–99)
Potassium: 4.6 mEq/L (ref 3.7–5.3)
SODIUM: 137 meq/L (ref 137–147)

## 2014-08-30 MED ORDER — METOPROLOL TARTRATE 1 MG/ML IV SOLN
5.0000 mg | INTRAVENOUS | Status: DC | PRN
  Administered 2014-09-01: 5 mg via INTRAVENOUS
  Filled 2014-08-30: qty 5

## 2014-08-30 NOTE — Progress Notes (Signed)
  Subjective:  Still has some intermittent anterior chest pain. She says it occurs after eating or drinking. She has a history of esophageal abnormality that Dr. Ewing SchleinMagod has been following.  Nurse notes that when her BP is lower the pain resolves.  Objective: Vital signs in last 24 hours: Temp:  [97.9 F (36.6 C)-98.8 F (37.1 C)] 98.2 F (36.8 C) (09/29 1239) Pulse Rate:  [60-77] 71 (09/29 1239) Cardiac Rhythm:  [-] Normal sinus rhythm;Bundle branch block (09/29 0800) Resp:  [12-21] 16 (09/29 1239) BP: (103-159)/(52-96) 137/69 mmHg (09/29 1239) SpO2:  [91 %-99 %] 94 % (09/29 1239) Weight:  [69.6 kg (153 lb 7 oz)] 69.6 kg (153 lb 7 oz) (09/29 0358)  Hemodynamic parameters for last 24 hours:    Intake/Output from previous day: 09/28 0701 - 09/29 0700 In: 817.5 [P.O.:600; I.V.:207.5] Out: 1105 [Urine:1105] Intake/Output this shift: Total I/O In: 335 [P.O.:180; I.V.:155] Out: -   General appearance: alert and cooperative Neurologic: intact Heart: regular rate and rhythm, S1, S2 normal, no murmur, click, rub or gallop Lungs: clear to auscultation bilaterally Extremities: extremities normal, atraumatic, no cyanosis or edema  Lab Results:  Recent Labs  08/29/14 0025 08/30/14 0336  WBC 9.5 9.9  HGB 11.1* 10.1*  HCT 35.2* 32.2*  PLT 212 205   BMET:  Recent Labs  08/29/14 0025 08/30/14 0336  NA 138 137  K 4.1 4.6  CL 96 97  CO2 24 24  GLUCOSE 245* 155*  BUN 23 32*  CREATININE 1.17* 1.20*  CALCIUM 9.7 9.6    PT/INR:  Recent Labs  08/28/14 1935  LABPROT 13.5  INR 1.03   ABG    Component Value Date/Time   PHART 7.322* 07/30/2010 1230   HCO3 23.7 07/30/2010 1230   TCO2 25.1 07/30/2010 1230   ACIDBASEDEF 1.5 07/30/2010 1230   O2SAT 95.7 07/30/2010 1230   CBG (last 3)   Recent Labs  08/29/14 2111 08/30/14 0816 08/30/14 1238  GLUCAP 185* 160* 143*    Assessment/Plan:  Acute intramural hematoma of the descending thoracic aorta with a 5.2 cm  atherosclerotic aneurysm. She has continued to have anterior chest pain but no back pain. She says the pain is primarily related to eating or drinking but nurse has noted pain to resolve with lower BP. It certainly could be related to the aorta if her BP is up. If this pain persists she should have a repeat chest CT to reevaluate the hematoma. Unfortunately this would require contrast. I reviewed her case with Dr. Myra GianottiBrabham and we think she would be a candidate for stent graft repair of her aneurysm but ideally would like to wait for at least a month to allow the aortic wall hematoma to heal. She is 89 next week but with this hematoma and a known 5.2 cm aneurysm I think the risk of further complications with her aorta is significant if this is not repaired. I discussed all of this with the patient and her husband. I will continue to follow her.   Alleen BorneBARTLE,Sissi Padia K 08/30/2014

## 2014-08-30 NOTE — Progress Notes (Signed)
  Echocardiogram 2D Echocardiogram has been performed.  Jennifer [name], [name] 08/30/2014, 10:19 AM

## 2014-08-30 NOTE — Progress Notes (Addendum)
    Subjective:  Denies dyspnea; Complains of chest pain this AM   Objective:  Filed Vitals:   08/30/14 0400 08/30/14 0500 08/30/14 0600 08/30/14 0700  BP: 129/83 135/68 138/72 145/76  Pulse: 72 66 66 72  Temp:      TempSrc:      Resp: 17 17 16 21   Height:      Weight:      SpO2: 93% 94% 93% 95%    Intake/Output from previous day:  Intake/Output Summary (Last 24 hours) at 08/30/14 0800 Last data filed at 08/30/14 0600  Gross per 24 hour  Intake    550 ml  Output   1105 ml  Net   -555 ml    Physical Exam: Physical exam: Well-developed well-nourished in no acute distress.  Skin is warm and dry.  HEENT is normal.  Neck is supple.  Chest is clear to auscultation with normal expansion.  Cardiovascular exam is regular rate and rhythm. 2+ pulses carotid, radial, and femoral Abdominal exam nontender or distended. No masses palpated. Extremities show no edema. neuro grossly intact    Lab Results: Basic Metabolic Panel:  Recent Labs  16/09/9608/27/15 1935 08/29/14 0025 08/30/14 0336  NA  --  138 137  K  --  4.1 4.6  CL  --  96 97  CO2  --  24 24  GLUCOSE  --  245* 155*  BUN  --  23 32*  CREATININE  --  1.17* 1.20*  CALCIUM  --  9.7 9.6  MG 1.7  --   --    CBC:  Recent Labs  08/28/14 0844 08/29/14 0025 08/30/14 0336  WBC 12.1* 9.5 9.9  NEUTROABS 9.9*  --   --   HGB 12.0 11.1* 10.1*  HCT 37.6 35.2* 32.2*  MCV 91.9 92.6 90.2  PLT 230 212 205   Cardiac Enzymes:  Recent Labs  08/28/14 1935 08/29/14 0025 08/29/14 0548  TROPONINI <0.30 <0.30 <0.30     Assessment/Plan:  1 thoracic aortic aneurysm with intramural hematoma involving descending thoracic aorta-Patient with worsening chest pain this AM and BP increasing. Continue felodipine and resume IV labetalol. Dr Laneta SimmersBartle has reviewed; ascending aorta not involved; continue medical therapy for now with tight BP control; I remain concerned about risk of rupture given intramural hematoma superimposed on TAA  measuring 52 mm; await input concerning candidacy for TEVAR. Check echocardiogram. Repeat ECG. 2 hypertension-continue plendil and IV labetalol. Follow blood pressure closely and increase as needed. 3 diabetes mellitus-follow CBGs. 4 hyperlipidemia-Continue present meds. 5 acute kidney failure-BUN and CR mildly increased; may be related to decreased perfusion with improved BP control; will gently hydrate and follow.     Jennifer [name] 08/30/2014, 8:00 AM

## 2014-08-31 LAB — GLUCOSE, CAPILLARY
GLUCOSE-CAPILLARY: 144 mg/dL — AB (ref 70–99)
Glucose-Capillary: 153 mg/dL — ABNORMAL HIGH (ref 70–99)
Glucose-Capillary: 147 mg/dL — ABNORMAL HIGH (ref 70–99)
Glucose-Capillary: 121 mg/dL — ABNORMAL HIGH (ref 70–99)

## 2014-08-31 LAB — BASIC METABOLIC PANEL
Anion gap: 13 (ref 5–15)
BUN: 31 mg/dL — AB (ref 6–23)
CHLORIDE: 98 meq/L (ref 96–112)
CO2: 24 meq/L (ref 19–32)
CREATININE: 1.16 mg/dL — AB (ref 0.50–1.10)
Calcium: 9.4 mg/dL (ref 8.4–10.5)
GFR calc Af Amer: 48 mL/min — ABNORMAL LOW (ref >90)
GFR calc non Af Amer: 41 mL/min — ABNORMAL LOW (ref >90)
Glucose, Bld: 146 mg/dL — ABNORMAL HIGH (ref 70–99)
Potassium: 4.6 mEq/L (ref 3.7–5.3)
Sodium: 135 mEq/L — ABNORMAL LOW (ref 137–147)

## 2014-08-31 LAB — CBC
HEMATOCRIT: 29.3 % — AB (ref 36.0–46.0)
HEMOGLOBIN: 9.3 g/dL — AB (ref 12.0–15.0)
MCH: 29.5 pg (ref 26.0–34.0)
MCHC: 31.7 g/dL (ref 30.0–36.0)
MCV: 93 fL (ref 78.0–100.0)
Platelets: 197 10*3/uL (ref 150–400)
RBC: 3.15 MIL/uL — ABNORMAL LOW (ref 3.87–5.11)
RDW: 15.5 % (ref 11.5–15.5)
WBC: 8.7 10*3/uL (ref 4.0–10.5)

## 2014-08-31 MED ORDER — LABETALOL HCL 200 MG PO TABS
200.0000 mg | ORAL_TABLET | Freq: Two times a day (BID) | ORAL | Status: DC
  Administered 2014-08-31 – 2014-09-02 (×6): 200 mg via ORAL
  Filled 2014-08-31 (×10): qty 1

## 2014-08-31 MED ORDER — WHITE PETROLATUM GEL
Status: AC
  Administered 2014-08-31: 1
  Filled 2014-08-31: qty 5

## 2014-08-31 NOTE — Care Management Note (Addendum)
    Page 1 of 1   09/13/2014     4:50:14 PM CARE MANAGEMENT NOTE 09/13/2014  Patient:  Jennifer [name],Jennifer [name]   Account Number:  [id number]  Date Initiated:  08/31/2014  Documentation initiated by:  Junius CreamerWELL,Jennifer  Subjective/Objective Assessment:   adm w aortic hematoma     Action/Plan:   lives w husband   Anticipated DC Date:  09/12/2014   Anticipated DC Plan:  SKILLED NURSING FACILITY  In-house referral  Clinical Social Worker      DC Planning Services  CM consult      Choice offered to / List presented to:             Status of service:  Completed, signed off Medicare Important Message given?  YES (If response is "NO", the following Medicare IM given date fields will be blank) Date Medicare IM given:  08/31/2014 Medicare IM given by:  Junius CreamerWELL,Jennifer Date Additional Medicare IM given:  09/12/2014 Additional Medicare IM given by:  Jennifer [name]  Discharge Disposition:  SKILLED NURSING FACILITY  Per UR Regulation:  Reviewed for med. necessity/level of care/duration of stay  If discussed at Long Length of Stay Meetings, dates discussed:   09/06/2014    Comments:  09/12/14 Jennifer [name] Aloni Chuang, RN, BSN [phone] PT now recommending SNF for rehab.  Pt discharging to SNF today, per CSW arrangements.  09/09/14 Jennifer [name] Jennifer Girgis, Rn, BSN [phone] PT recommending HH follow up at dc, RW.  Pt states not sure she wants HH follow up.  Left provider list at bedside for her to discuss with her husband.  Will need orders.  10/6 1355 Jennifer dowell rn,bsn spoke w pt and husband, pt has walker she can use at home. she is not sure about hhc but will consider this at disch. will cont to follow.

## 2014-08-31 NOTE — Progress Notes (Signed)
    Subjective:  Denies dyspnea; CP this AM unchanged; no back pain   Objective:  Filed Vitals:   08/31/14 0400 08/31/14 0500 08/31/14 0600 08/31/14 0700  BP: 110/56 102/78 101/51 99/51  Pulse: 59 62 59 59  Temp:      TempSrc:      Resp: 21 20 20 18   Height:      Weight:  156 lb 1.4 oz (70.8 kg)    SpO2: 95% 93% 93% 94%    Intake/Output from previous day:  Intake/Output Summary (Last 24 hours) at 08/31/14 0825 Last data filed at 08/31/14 0700  Gross per 24 hour  Intake 2218.75 ml  Output    100 ml  Net 2118.75 ml    Physical Exam: Physical exam: Well-developed well-nourished in no acute distress.  Skin is warm and dry.  HEENT is normal.  Neck is supple.  Chest is clear to auscultation with normal expansion.  Cardiovascular exam is regular rate and rhythm. 2+ pulses carotid, radial, and femoral Abdominal exam nontender or distended. No masses palpated. Extremities show no edema. neuro grossly intact    Lab Results: Basic Metabolic Panel:  Recent Labs  16/09/9608/27/15 1935  08/30/14 0336 08/31/14 0300  NA  --   < > 137 135*  K  --   < > 4.6 4.6  CL  --   < > 97 98  CO2  --   < > 24 24  GLUCOSE  --   < > 155* 146*  BUN  --   < > 32* 31*  CREATININE  --   < > 1.20* 1.16*  CALCIUM  --   < > 9.6 9.4  MG 1.7  --   --   --   < > = values in this interval not displayed. CBC:  Recent Labs  08/28/14 0844  08/30/14 0336 08/31/14 0300  WBC 12.1*  < > 9.9 8.7  NEUTROABS 9.9*  --   --   --   HGB 12.0  < > 10.1* 9.3*  HCT 37.6  < > 32.2* 29.3*  MCV 91.9  < > 90.2 93.0  PLT 230  < > 205 197  < > = values in this interval not displayed. Cardiac Enzymes:  Recent Labs  08/28/14 1935 08/29/14 0025 08/29/14 0548  TROPONINI <0.30 <0.30 <0.30     Assessment/Plan:  1 thoracic aortic aneurysm with intramural hematoma involving descending thoracic aorta-Patient with chest pain this AM unchanged; no back pain. Continue felodipine; DC metoprolol; change labetalol to  po. Dr Laneta SimmersBartle has reviewed; ascending aorta not involved; continue medical therapy for now with tight BP control; she will be a candidate for stent but plan to wait for one month as long as pt stable. Possible transfer to floor in AM if stable. 2 hypertension-continue plendil and change to po labetalol. DC metoprolol. Follow blood pressure closely and increase as needed. 3 diabetes mellitus-follow CBGs. 4 hyperlipidemia-Continue present meds. 5 acute kidney failure-BUN and CR mildly increased; may be related to decreased perfusion with improved BP control; continue gentle hydration and follow.     Olga MillersBrian Neyda Durango 08/31/2014, 8:25 AM

## 2014-09-01 ENCOUNTER — Inpatient Hospital Stay (HOSPITAL_COMMUNITY): Payer: Medicare Other

## 2014-09-01 DIAGNOSIS — J9601 Acute respiratory failure with hypoxia: Secondary | ICD-10-CM | POA: Diagnosis not present

## 2014-09-01 DIAGNOSIS — I712 Thoracic aortic aneurysm, without rupture: Secondary | ICD-10-CM

## 2014-09-01 DIAGNOSIS — I9589 Other hypotension: Secondary | ICD-10-CM

## 2014-09-01 DIAGNOSIS — R570 Cardiogenic shock: Secondary | ICD-10-CM | POA: Diagnosis not present

## 2014-09-01 DIAGNOSIS — J810 Acute pulmonary edema: Secondary | ICD-10-CM | POA: Diagnosis not present

## 2014-09-01 DIAGNOSIS — I319 Disease of pericardium, unspecified: Secondary | ICD-10-CM

## 2014-09-01 LAB — CBC
HCT: 28.9 % — ABNORMAL LOW (ref 36.0–46.0)
HEMOGLOBIN: 9.3 g/dL — AB (ref 12.0–15.0)
MCH: 29.2 pg (ref 26.0–34.0)
MCHC: 32.2 g/dL (ref 30.0–36.0)
MCV: 90.6 fL (ref 78.0–100.0)
Platelets: 199 10*3/uL (ref 150–400)
RBC: 3.19 MIL/uL — ABNORMAL LOW (ref 3.87–5.11)
RDW: 15.5 % (ref 11.5–15.5)
WBC: 8 10*3/uL (ref 4.0–10.5)

## 2014-09-01 LAB — GLUCOSE, CAPILLARY
GLUCOSE-CAPILLARY: 129 mg/dL — AB (ref 70–99)
Glucose-Capillary: 143 mg/dL — ABNORMAL HIGH (ref 70–99)
Glucose-Capillary: 176 mg/dL — ABNORMAL HIGH (ref 70–99)
Glucose-Capillary: 152 mg/dL — ABNORMAL HIGH (ref 70–99)

## 2014-09-01 LAB — BASIC METABOLIC PANEL
Anion gap: 13 (ref 5–15)
BUN: 27 mg/dL — AB (ref 6–23)
CO2: 23 mEq/L (ref 19–32)
CREATININE: 1.09 mg/dL (ref 0.50–1.10)
Calcium: 9.1 mg/dL (ref 8.4–10.5)
Chloride: 100 mEq/L (ref 96–112)
GFR, EST AFRICAN AMERICAN: 52 mL/min — AB (ref >90)
GFR, EST NON AFRICAN AMERICAN: 45 mL/min — AB (ref >90)
Glucose, Bld: 147 mg/dL — ABNORMAL HIGH (ref 70–99)
Potassium: 4.7 mEq/L (ref 3.7–5.3)
Sodium: 136 mEq/L — ABNORMAL LOW (ref 137–147)

## 2014-09-01 LAB — TROPONIN I
Troponin I: <0.3 ng/mL (ref <0.30)
Troponin I: <0.3 ng/mL (ref <0.30)

## 2014-09-01 MED ORDER — DOPAMINE-DEXTROSE 3.2-5 MG/ML-% IV SOLN
INTRAVENOUS | Status: AC
  Administered 2014-09-01: 3 ug/kg/min via INTRAVENOUS
  Filled 2014-09-01: qty 250

## 2014-09-01 MED ORDER — HYDRALAZINE HCL 25 MG PO TABS
25.0000 mg | ORAL_TABLET | Freq: Three times a day (TID) | ORAL | Status: DC
  Administered 2014-09-01: 25 mg via ORAL
  Filled 2014-09-01 (×2): qty 1

## 2014-09-01 MED ORDER — DEXTROSE 5 % IV SOLN
0.0000 ug/min | INTRAVENOUS | Status: DC
  Administered 2014-09-01: 5 ug/min via INTRAVENOUS
  Filled 2014-09-01: qty 16

## 2014-09-01 MED ORDER — LABETALOL HCL 5 MG/ML IV SOLN
10.0000 mg | INTRAVENOUS | Status: DC | PRN
  Administered 2014-09-02 – 2014-09-04 (×7): 10 mg via INTRAVENOUS
  Filled 2014-09-01 (×7): qty 4

## 2014-09-01 MED ORDER — LABETALOL HCL 200 MG PO TABS
200.0000 mg | ORAL_TABLET | Freq: Once | ORAL | Status: AC
  Administered 2014-09-01: 200 mg via ORAL
  Filled 2014-09-01: qty 1

## 2014-09-01 MED ORDER — SODIUM CHLORIDE 0.9 % IV SOLN
INTRAVENOUS | Status: DC
  Administered 2014-09-02: 14:00:00 via INTRAVENOUS

## 2014-09-01 MED ORDER — FUROSEMIDE 10 MG/ML IJ SOLN
20.0000 mg | INTRAMUSCULAR | Status: AC
  Administered 2014-09-01: 20 mg via INTRAVENOUS

## 2014-09-01 MED ORDER — LABETALOL HCL 5 MG/ML IV SOLN
10.0000 mg | INTRAVENOUS | Status: DC | PRN
  Administered 2014-09-01: 10 mg via INTRAVENOUS
  Filled 2014-09-01: qty 4

## 2014-09-01 MED ORDER — NOREPINEPHRINE BITARTRATE 1 MG/ML IV SOLN: 0.0000 ug/min | INTRAVENOUS | Status: DC

## 2014-09-01 MED ORDER — IOHEXOL 350 MG/ML SOLN
80.0000 mL | Freq: Once | INTRAVENOUS | Status: AC | PRN
  Administered 2014-09-01: 80 mL via INTRAVENOUS

## 2014-09-01 MED ORDER — PANTOPRAZOLE SODIUM 40 MG IV SOLR
40.0000 mg | INTRAVENOUS | Status: DC
  Administered 2014-09-01: 40 mg via INTRAVENOUS
  Filled 2014-09-01 (×2): qty 40

## 2014-09-01 MED ORDER — FUROSEMIDE 10 MG/ML IJ SOLN
INTRAMUSCULAR | Status: AC
  Administered 2014-09-01: 20 mg via INTRAVENOUS
  Filled 2014-09-01: qty 2

## 2014-09-01 MED ORDER — LABETALOL HCL 200 MG PO TABS
200.0000 mg | ORAL_TABLET | ORAL | Status: AC
  Administered 2014-09-01: 200 mg via ORAL
  Filled 2014-09-01: qty 1

## 2014-09-01 MED ORDER — FUROSEMIDE 10 MG/ML IJ SOLN: 40.0000 mg | Freq: Once | INTRAMUSCULAR | Status: DC

## 2014-09-01 MED ORDER — FUROSEMIDE 10 MG/ML IJ SOLN
20.0000 mg | Freq: Once | INTRAMUSCULAR | Status: AC
  Administered 2014-09-01: 20 mg via INTRAVENOUS
  Filled 2014-09-01: qty 2

## 2014-09-01 MED ORDER — DOPAMINE-DEXTROSE 3.2-5 MG/ML-% IV SOLN
0.0000 ug/kg/min | INTRAVENOUS | Status: DC
  Administered 2014-09-01: 3 ug/kg/min via INTRAVENOUS

## 2014-09-01 NOTE — Progress Notes (Signed)
Dr. Jens Somrenshaw notified of hypotension and increasing oxygen requirements.  Will continue to monitor pt closely.

## 2014-09-01 NOTE — Progress Notes (Signed)
Pt's arterial BP 170/75 (110), cuff pressure 165/71 (105). Cardiology fellow paged. Dr. Zachery ConchFriedman called. Verbal orders to give Labetalol IV 10mg  Q4hr PRN.

## 2014-09-01 NOTE — Progress Notes (Signed)
Pt continues to be hypertensive, BP 154/62, asleep, asymptomatic. Dr Leeann MustJacob Kelly paged and ordered another 200mg   PO Labetalol.

## 2014-09-01 NOTE — Procedures (Signed)
Central Venous Catheter Insertion Procedure Note Jennifer MusselJANIE B [name] [id number] 12/20/1924  Procedure: Insertion of Central Venous Catheter Indications: Assessment of intravascular volume and Drug and/or fluid administration  Procedure Details Consent: Risks of procedure as well as the alternatives and risks of each were explained to the (patient/caregiver).  Consent for procedure obtained. Time Out: Verified patient identification, verified procedure, site/side was marked, verified correct patient position, special equipment/implants available, medications/allergies/relevent history reviewed, required imaging and test results available.  Performed  Maximum sterile technique was used including antiseptics, cap, gloves, gown, hand hygiene, mask and sheet. Skin prep: Chlorhexidine; local anesthetic administered A antimicrobial bonded/coated triple lumen catheter was placed in the left internal jugular vein using the Seldinger technique.  Evaluation Blood flow good Complications: No apparent complications Patient did tolerate procedure well. Chest X-ray ordered to verify placement.  CXR: pending.  Performed under direct MD supervision.  Performed using ultrasound guidance.  Wire visualized in vessel under ultrasound.   U/S used in placement.  Dirk DressKaty Whiteheart, NP 09/01/2014  4:02 PM Pager: [phone] or [phone]  I was present and supervised the entire procedure.  Alyson ReedyWesam G. Keaunna Skipper, M.D. [facility]. Pager: [phone]. After hours pager: [phone].

## 2014-09-01 NOTE — Consult Note (Signed)
PULMONARY / CRITICAL CARE MEDICINE   Name: Jennifer [name] MRN: [id number] DOB: October 28, 1925    ADMISSION DATE:  08/28/2014 CONSULTATION DATE:  09/01/2014  REFERRING MD :  Dr. Graciela Husbands   CHIEF COMPLAINT:  Chest pain  INITIAL PRESENTATION: 80 yo female presented to ED 9/27 with chest pain. She was admitted with suspicion for aortic aneurysm vs PE. A CT of the chest showed acute intramural hemorrhage of the descending thoracic aorta.  STUDIES:  9/27 CT chest  > Acute intramural hemorrhage of the descending thoracic aorta with an associated mid descending thoracic aortic 5 cm aneurysm. Stable 8 mm right middle lobe well-circumscribed nodule  10/1 CT chest  > Stable appearance of intramural hemorrhage in the descending thoracic aorta and 5.2 cm mid descending fusiform aneurysm. This results in compression of the adjacent superior left pulmonary vein. New pleural effusions left greater than right. 11 mm right lower lobe pulmonary nodule, previously 8 mm   SIGNIFICANT EVENTS: 9/27 admitted 10/1 acute decompensation  HISTORY OF PRESENT ILLNESS:  80 yo female with history of AAA (repaired), DM, HTN presented 9/27 c/o chest pain which awoke her from sleep. Pain as atypical for ischemic pain, but was still concerning for admitting cardiology MD. He ordered CTA chest which discovered intramural hemorrhage of the descending thoracic aorta and aneurysm. She was admitted and treated with BP management. And had initially improved, and was transitioned to plendil. 9/29 she had acute onset of CP again and was placed on IV labetalol. Pain resolves with lower BP. TCTS saw her and waneted a repeat CT to eval for progression of this, until then the plan was for tight BP control and maybe a candidate for stent graft placement. 10/1 CT shows no progression to thoracic aorta hemorrhage, however she began to acutely decompensate and became hypotensive and SOB. She was placed on pressors and PCCM was consulted.  PAST MEDICAL  HISTORY :   has a past medical history of Diabetes mellitus without complication; Hypertension; and AAA (abdominal aortic aneurysm)-repaired.  has past surgical history that includes Abdominal hysterectomy and Abdominal aortic aneurysm repair. Prior to Admission medications   Medication Sig Start Date End Date Taking? Authorizing Provider  aspirin EC 81 MG tablet Take 81 mg by mouth daily.   Yes Historical Provider, MD  calcium carbonate (OS-CAL) 600 MG TABS tablet Take 600 mg by mouth 2 (two) times daily with a meal.   Yes Historical Provider, MD  felodipine (PLENDIL) 10 MG 24 hr tablet Take 10 mg by mouth daily.  08/07/14  Yes Historical Provider, MD  gemfibrozil (LOPID) 600 MG tablet Take 600 mg by mouth 2 (two) times daily before a meal.  07/28/14  Yes Historical Provider, MD  HYDROcodone-acetaminophen (NORCO/VICODIN) 5-325 MG per tablet Take 1 tablet by mouth every 6 (six) hours as needed (pain).  08/18/14  Yes Historical Provider, MD  metFORMIN (GLUCOPHAGE) 500 MG tablet Take 500 mg by mouth 2 (two) times daily with a meal.  07/28/14  Yes Historical Provider, MD  Polyethyl Glycol-Propyl Glycol (SYSTANE OP) Place 1 drop into both eyes 2 (two) times daily.   Yes Historical Provider, MD  Vitamin D, Ergocalciferol, (DRISDOL) 50000 UNITS CAPS capsule Take 50,000 Units by mouth every 7 (seven) days. Every saturday   Yes Historical Provider, MD   No Known Allergies  FAMILY HISTORY:  has no family status information on file.  SOCIAL HISTORY:  reports that she has never smoked. She does not have any smokeless tobacco history on file.  She reports that she does not drink alcohol.  REVIEW OF SYSTEMS:  - unable, encephalopathic  SUBJECTIVE:   VITAL SIGNS: Temp:  [97.4 F (36.3 C)-98.4 F (36.9 C)] 97.4 F (36.3 C) (10/01 1210) Pulse Rate:  [56-73] 62 (10/01 1453) Resp:  [15-29] 21 (10/01 1453) BP: (64-154)/(38-114) 80/38 mmHg (10/01 1449) SpO2:  [88 %-97 %] 97 % (10/01 1453) Weight:  [69.5 kg  (153 lb 3.5 oz)] 69.5 kg (153 lb 3.5 oz) (10/01 0530) HEMODYNAMICS:   VENTILATOR SETTINGS:   INTAKE / OUTPUT:  Intake/Output Summary (Last 24 hours) at 09/01/14 1536 Last data filed at 09/01/14 0900  Gross per 24 hour  Intake   1120 ml  Output      0 ml  Net   1120 ml    PHYSICAL EXAMINATION: General:  Elderly female in mild distress Neuro:  Alert to verbal, lethargic. HEENT:  [place]/AT, PERRL, no JVD noted Cardiovascular:  RRR Lungs:  Diminished, scattered fine crackles Abdomen:  Soft, non-tender, non-distended Musculoskeletal:  No acute deformity Skin:  Intact  LABS:  CBC  Recent Labs Lab 08/30/14 0336 08/31/14 0300 09/01/14 0400  WBC 9.9 8.7 8.0  HGB 10.1* 9.3* 9.3*  HCT 32.2* 29.3* 28.9*  PLT 205 197 199   Coag's  Recent Labs Lab 08/28/14 1935  APTT 28  INR 1.03   BMET  Recent Labs Lab 08/30/14 0336 08/31/14 0300 09/01/14 0400  NA 137 135* 136*  K 4.6 4.6 4.7  CL 97 98 100  CO2 24 24 23   BUN 32* 31* 27*  CREATININE 1.20* 1.16* 1.09  GLUCOSE 155* 146* 147*   Electrolytes  Recent Labs Lab 08/28/14 1935  08/30/14 0336 08/31/14 0300 09/01/14 0400  CALCIUM  --   < > 9.6 9.4 9.1  MG 1.7  --   --   --   --   < > = values in this interval not displayed. Sepsis Markers No results found for this basename: LATICACIDVEN, PROCALCITON, O2SATVEN,  in the last 168 hours ABG No results found for this basename: PHART, PCO2ART, PO2ART,  in the last 168 hours Liver Enzymes  Recent Labs Lab 08/28/14 0844  AST 13  ALT 11  ALKPHOS 50  BILITOT 0.2*  ALBUMIN 4.2   Cardiac Enzymes  Recent Labs Lab 08/28/14 1935 08/29/14 0025 08/29/14 0548  TROPONINI <0.30 <0.30 <0.30   Glucose  Recent Labs Lab 08/31/14 0830 08/31/14 1127 08/31/14 1709 08/31/14 2138 09/01/14 0753 09/01/14 1211  GLUCAP 153* 147* 144* 121* 143* 129*    Imaging No results found.   ASSESSMENT / PLAN:  PULMONARY OETT  A: Acute hypoxemic respiratory  failure Pulmonary edema  P:   Supplemental O2 to maintain SpO2 > 92% STAT ABG noted, no need for intubation for now. BiPAP only for comfort at this point Will remove for now.  CARDIOVASCULAR CVL LIJ 10/1 >>> R Rad Art line 10/1 >>> A:  Shock, cardiogenic vs hemorrhagic Acute intramural hemorrhage of the descending thoracic aorta  H/o HTN  P:  Insert CVL F/U CVP Insert Art Line CVP monitoring MAP goal > 55mm/Hg norepinephrine to achieve MAP goal D/c dopamine to avoid tachycardia Trend lactic acid IVF to [facility] Hold antihypertensives (labetalol, plendil) Repeat echo per cards  RENAL A:   No acute issues  P:   Follow Bmet KVO  GASTROINTESTINAL A:   No acute issues  P:   SUP: IV protonix NPO in case of need for intubation  HEMATOLOGIC A:   Anemia, acute  P:  Trend CBC Transfuse for Hgb < 7  INFECTIOUS A:   No acute issues  P:   Monitor clinically  ENDOCRINE A:   DM    P:   CBG SSI  NEUROLOGIC A:  Acute encephalopathy d/t hypoxia, hypotension  P:   RASS goal: 0 Monitor Hold sedating meds (xanax)  Joneen RoachPaul Hoffman, ACNP [facility] Pager [phone] or [phone]  TODAY'S SUMMARY: 80 year old female, full code, history of type B aortic dissection, presenting with hypotension, AMS and hypoxemic respiratory failure and pulmonary edema.  Patient does not require intubation for now but husband is clear he wishes for full code.  I anticipate if unable to diurese then will require intubation later on today or tomorrow.  BiPAP as PRN for now.  Pressors as ordered, avoid dopamine to avoid high HR with dissection.  Will monitor.  I have personally obtained a history, examined the patient, evaluated laboratory and imaging results, formulated the assessment and plan and placed orders.  CRITICAL CARE: The patient is critically ill with multiple organ systems failure and requires high complexity decision making for assessment and  support, frequent evaluation and titration of therapies, application of advanced monitoring technologies and extensive interpretation of multiple databases. Critical Care Time devoted to patient care services described in this note is 45 minutes.

## 2014-09-01 NOTE — Progress Notes (Signed)
    Subjective:  Denies dyspnea; CP this AM unchanged; no back pain   Objective:  Filed Vitals:   09/01/14 0500 09/01/14 0530 09/01/14 0600 09/01/14 0630  BP: 147/58 146/64 132/62 136/54  Pulse: 66 68 66 68  Temp:      TempSrc:      Resp: 18 19 19 21   Height:      Weight:  153 lb 3.5 oz (69.5 kg)    SpO2: 95% 94% 95% 94%    Intake/Output from previous day:  Intake/Output Summary (Last 24 hours) at 09/01/14 0753 Last data filed at 09/01/14 0600  Gross per 24 hour  Intake 1692.5 ml  Output      0 ml  Net 1692.5 ml    Physical Exam: Physical exam: Well-developed well-nourished in no acute distress.  Skin is warm and dry.  HEENT is normal.  Neck is supple.  Chest with dimiinished BS bases Cardiovascular exam is regular rate and rhythm. 2+ pulses carotid, radial, and femoral Abdominal exam nontender or distended. No masses palpated. Extremities show no edema. neuro grossly intact    Lab Results: Basic Metabolic Panel:  Recent Labs  16/09/9608/30/15 0300 09/01/14 0400  NA 135* 136*  K 4.6 4.7  CL 98 100  CO2 24 23  GLUCOSE 146* 147*  BUN 31* 27*  CREATININE 1.16* 1.09  CALCIUM 9.4 9.1   CBC:  Recent Labs  08/31/14 0300 09/01/14 0400  WBC 8.7 8.0  HGB 9.3* 9.3*  HCT 29.3* 28.9*  MCV 93.0 90.6  PLT 197 199     Assessment/Plan:  1 thoracic aortic aneurysm with intramural hematoma involving descending thoracic aorta-Patient with continued chest pain this AM; no back pain. Continue felodipine and labetalol; goal SBP < 120. May need to increase meds. Dr Laneta SimmersBartle has reviewed; ascending aorta not involved; continue medical therapy for now with tight BP control; she will be a candidate for stent but plan to wait for one month as long as pt stable. Given persistent pain, will repeat CTA of aorta today. 2 hypertension-continue plendil and labetalol. Follow blood pressure closely and increase as needed. 3 diabetes mellitus-follow CBGs. 4 hyperlipidemia-Continue  present meds. 5 acute kidney failure-BUN and CR improved this AM; DC IVFs as she has diminished BS bases. Lasix 20 mg IV x 1. Watch renal function with repeat CTA.    Jennifer MillersBrian Crenshaw 09/01/2014, 7:53 AM

## 2014-09-01 NOTE — Progress Notes (Signed)
Echocardiogram 2D Echocardiogram limited has been performed.  Jennifer [name] 09/01/2014, 4:19 PM

## 2014-09-01 NOTE — Progress Notes (Signed)
Pt progressively more hypertensive, BP 145/70, no changes after PRN metoprolol, pt currently asleep. Dr. Leeann MustJacob Kelly paged, 200mg  PO labetalol ordered.

## 2014-09-01 NOTE — Progress Notes (Signed)
Dr. Jens Somrenshaw called to check on pt.  Notified of continued hypotension and increasing oxygen requirements.  Orders received.  Will continue to monitor pt closely.

## 2014-09-01 NOTE — Progress Notes (Signed)
Subjective:  Patient is sleeping but arouses to stimulation.  She developed hypotension, shortness of breath and hypoxemia this afternoon requiring dopamine without improvement so she was switched to levophed. Now BP is 100 and levophed weaned some. She was complaining of chest pain today which she has had frequently.  She had a repeat chest CTA this afternoon to rule output aortic rupture and the intramural hematoma and aneurysm in the descending aorta looks the same. There is a moderate left pleural effusion with LLL atelectasis that is probably partly responsible for her shortness of breath and hypoxemia.  Objective: Vital signs in last 24 hours: Temp:  [97.4 F (36.3 C)-98.4 F (36.9 C)] 97.4 F (36.3 C) (10/01 1210) Pulse Rate:  [56-82] 82 (10/01 1554) Cardiac Rhythm:  [-] Normal sinus rhythm;Bundle branch block (10/01 1200) Resp:  [15-29] 23 (10/01 1554) BP: (64-154)/(25-114) 95/42 mmHg (10/01 1554) SpO2:  [88 %-99 %] 93 % (10/01 1554) FiO2 (%):  [40 %-100 %] 100 % (10/01 1449) Weight:  [69.5 kg (153 lb 3.5 oz)] 69.5 kg (153 lb 3.5 oz) (10/01 0530)  Hemodynamic parameters for last 24 hours:    Intake/Output from previous day: 09/30 0701 - 10/01 0700 In: 1692.5 [P.O.:520; I.V.:1172.5] Out: -  Intake/Output this shift: Total I/O In: 55.9 [I.V.:55.9] Out: -   General appearance: slowed mentation Heart: regular rate and rhythm, no murmur Lungs: diminished breath sounds bibasilar L>R Extremities: edema mild generalized  Lab Results:  Recent Labs  08/31/14 0300 09/01/14 0400  WBC 8.7 8.0  HGB 9.3* 9.3*  HCT 29.3* 28.9*  PLT 197 199   BMET:  Recent Labs  08/31/14 0300 09/01/14 0400  NA 135* 136*  K 4.6 4.7  CL 98 100  CO2 24 23  GLUCOSE 146* 147*  BUN 31* 27*  CREATININE 1.16* 1.09  CALCIUM 9.4 9.1    PT/INR: No results found for this basename: LABPROT, INR,  in the last 72 hours ABG    Component Value Date/Time   PHART 7.322* 07/30/2010 1230   HCO3 23.7 07/30/2010 1230   TCO2 25.1 07/30/2010 1230   ACIDBASEDEF 1.5 07/30/2010 1230   O2SAT 95.7 07/30/2010 1230   CBG (last 3)   Recent Labs  08/31/14 2138 09/01/14 0753 09/01/14 1211  GLUCAP 121* 143* 129*   CLINICAL DATA: Intramural hematoma, thoracic aortic aneurysm  EXAM:  CT ANGIOGRAPHY CHEST WITH CONTRAST  TECHNIQUE:  Multidetector CT imaging of the chest was performed using the  standard protocol during bolus administration of intravenous  contrast. Multiplanar CT image reconstructions and MIPs were  obtained to evaluate the vascular anatomy.  CONTRAST: 80mL OMNIPAQUE IOHEXOL 350 MG/ML SOLN  COMPARISON: 08/28/2014  FINDINGS:  Contrast reflux from the right atrium into hepatic veins. There good  contrast opacification of pulmonary artery branches with no filling  defects to suggest acute PE. There is compression of the superior  left pulmonary vein by at the descending thoracic aortic aneurysm.  Mild plaque in the ascending aorta. Classic 3 vessel brachiocephalic  arterial origin anatomy without proximal stenosis.  Hyperdense intramural hematoma in the distal arch beyond the  subclavian origin extending into the distal descending thoracic  segment. There is scattered plaque and displaced intimal  calcifications. Fusiform aneurysmal dilatation of the mid descending  thoracic aorta, 5.1 x 5.2 cm maximum transverse dimensions, stable  since previous study, tapering to a diameter of 3 cm, tortuous in  its distal descending segment above the diaphragm. The intramural  hematoma does not extend below  the diaphragm. The visualized  proximal abdominal aorta shows moderate atheromatous change without  dissection, aneurysm, or stenosis. 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  New bilateral pleural effusions left greater than right. No  pericardial effusion. Mild four-chamber cardiac enlargement. No  hilar or mediastinal adenopathy  identified. There is consolidation/  atelectasis in the posterior left lower lobe. Dependent atelectasis  in the right lower lobe and left upper lobe. 11mm nodule, right  middle lobe image 65/6, previously 8 mm on 06/22/2010.  Review of the MIP images confirms the above findings.  IMPRESSION:  1. Stable appearance of intramural hemorrhage in the descending  thoracic aorta and 5.2 cm mid descending fusiform aneurysm. This  results in compression of the adjacent superior left pulmonary vein.  2. New pleural effusions left greater than right, with  consolidation/atelectasis in the posterior left lower lobe  3. 11 mm right lower lobe pulmonary nodule, previously 8 mm on  06/22/2010. Follow-up recommended to exclude low-grade neoplasm.  Electronically Signed  By: Oley Balmaniel Hassell M.D.  On: 09/01/2014 13:26   *[place]* *[facility]* [address] [place], [place] [address] [phone]  ------------------------------------------------------------------- Transthoracic Echocardiography  Patient: Jennifer [name], Jennifer [name] MR #: [id number] Study Date: 09/01/2014 Gender: F Age: [age] Height: 160 cm Weight: 69.5 kg BSA: 1.78 m^2 Pt. Status: Room: [facility]2H05C  ADMITTING Berton MountSteve Klein, M.D. ATTENDING Berton MountSteve Klein, M.D. ORDERING Barrett, Rhonda G REFERRING Barrett, Joline SaltRhonda G SONOGRAPHER Nolon Rodony Brown, RDCS PERFORMING [facility], Inpatient  cc:  ------------------------------------------------------------------- LV EF: 65%  ------------------------------------------------------------------- Indications: Pericardial effusion 423.9.  ------------------------------------------------------------------- Study Conclusions  - Left ventricle: The estimated ejection fraction was 65%. - Mitral valve: Calcified annulus. - Pulmonary arteries: PA peak pressure: 38 mm Hg (S). - Impressions: Since 08/30/2014, there is no new significant pericardial effusion.  Impressions:  - Since 08/30/2014, there is  no new significant pericardial effusion.  Transthoracic echocardiography. M-mode, limited 2D, limited spectral Doppler, and color Doppler. Birthdate: Patient birthdate: 25-Dec-1924. Age: Patient is 80 yr old. Sex: Gender: female. BMI: 27.2 kg/m^2. Blood pressure: 95/42 Patient status: Inpatient. Study date: Study date: 09/01/2014. Study time: 03:57 PM. Location: ICU/CCU  -------------------------------------------------------------------  ------------------------------------------------------------------- Left ventricle: The estimated ejection fraction was 65%.  ------------------------------------------------------------------- Aortic valve: Sclerosis without stenosis.  ------------------------------------------------------------------- Mitral valve: Calcified annulus. Doppler: There was no significant regurgitation. Peak gradient (D): 6 mm Hg.  ------------------------------------------------------------------- Right ventricle: The cavity size was normal. Systolic function was normal.  ------------------------------------------------------------------- Tricuspid valve: Structurally normal valve. Leaflet separation was normal. Doppler: Transvalvular velocity was within the normal range. There was no regurgitation.  ------------------------------------------------------------------- Measurements  Left ventricle Value Reference LV ID, ED, PLAX chordal 43.9 mm 43 - 52 LV ID, ES, PLAX chordal 31.8 mm 23 - 38 LV fx shortening, PLAX chordal (L) 28 % >=29 LV PW thickness, ED 12 mm --------- IVS/LV PW ratio, ED (H) 1.32 <=1.3  Ventricular septum Value Reference IVS thickness, ED 15.8 mm ---------  Aorta Value Reference Aortic root ID 34 mm ---------  Left atrium Value Reference LA ID, A-P, ES 34 mm --------- LA ID/bsa, A-P 1.91 cm/m^2 <=2.2  Mitral valve Value Reference Mitral E-wave peak velocity 122 cm/s --------- Mitral A-wave peak velocity 89.7 cm/s --------- Mitral  peak gradient, D 6 mm Hg --------- Mitral E/A ratio, peak 1.36 ---------  Pulmonary arteries Value Reference PA pressure, S, DP (H) 38 mm Hg <=30  Tricuspid valve Value Reference Tricuspid regurg peak velocity 274 cm/s --------- Tricuspid peak RV-RA gradient 30  mm Hg ---------  Systemic veins Value Reference Estimated CVP 8 mm Hg ---------  Right ventricle Value Reference RV pressure, S, DP (H) 38 mm Hg <=30  Legend: (L) and (H) mark values outside specified reference range.  ------------------------------------------------------------------- Prepared and Electronically Authenticated by  Willa Rough, MD 2015-10-01T16:42:46   Assessment/Plan:  Her aortic intramural hematoma and descending aneurysm are stable. I doubt that this is responsible for her anterior chest pain. I would not recommend treating with a stent graft for a month as long as there is no change to allow the aorta to heal. This will significantly decrease the risk of complications with a stent graft. She does have diffuse LM and proximal LCX and LAD coronary calcification on CT scan and I would not be surprised if she had significant coronary disease. Cath may be inevitable if she is going to be treated aggressively. Her hypotension may have been due to meds since there is no sign of bleeding and her echo shows good LV function with no pericardial effusion. I suspect that her shortness of breath and hypoxemia are due to her pleural effusions and atelectasis, possibly some coronary ischemia. I discussed the status of her aorta with her husband at the bedside.   LOS: 4 days    BARTLE,BRYAN K 09/01/2014

## 2014-09-01 NOTE — Progress Notes (Signed)
Paged Dr. Jens Somrenshaw to notify of elevated blood pressure. Orders received.

## 2014-09-01 NOTE — Progress Notes (Signed)
Paged cardmaster to notify of continued increased blood pressure (above parameters).

## 2014-09-01 NOTE — Procedures (Signed)
Arterial Catheter Insertion Procedure Note Jennifer MusselJANIE B [name] [id number] 07/20/1925  Procedure: Insertion of Arterial Catheter  Indications: Blood pressure monitoring and Frequent blood sampling  Procedure Details Consent: Unable to obtain consent because of emergent medical necessity. Time Out: Verified patient identification, verified procedure, site/side was marked, verified correct patient position, special equipment/implants available, medications/allergies/relevent history reviewed, required imaging and test results available.  Performed  Maximum sterile technique was used including antiseptics, cap, gloves, gown, hand hygiene, mask and sheet. Skin prep: Chlorhexidine; local anesthetic administered 20 gauge catheter was inserted into right radial artery using the Seldinger technique.  Evaluation Blood flow good; BP tracing good. Complications: No apparent complications.  I performed the procedure.  Alyson ReedyWesam G. Charlayne Vultaggio, M.D. [facility]. Pager: [phone]. After hours pager: [phone].

## 2014-09-01 NOTE — Progress Notes (Signed)
Dr. Jens Somrenshaw notified of continued increased blood pressure above desired parameters.  Will continue to monitor pt closely.

## 2014-09-01 NOTE — Progress Notes (Signed)
Called to see pt re: hypotension  Pt admitted 09/27 w/ chest pain. Dx w/ descending thoracic AA, seen by Dr. Laneta SimmersBartle and tight BP control recommended. TEVAR a possibility.   Today, she got Plendil 10 mg, Lasix 20 mg IV, labetalol 200 mg x 2 doses.   Her BP was high and she had pleural effusions on CT chest, so just after noon she got hydralazine 25 mg po, Lasix 20 mg IV.  At approximately 2:30 pm today, she developed hypotension, with SBPs in the high 70s and 80s. She was also SOB with O2 sats in the 80s. She was started on O2 by NRB and DA, currently at 15 mcg/kg/min.  She is mentating well, but is very weak. She responds appropriately to questions, but sleeps unless roused. She denies SOB but requests to sit up. She has some mild chest pain, but has been having this without change for > 24 hours.  ++JVP and basilar rales by exam, so will start BiPAP. BP too low for additional diuresis. CCM contacted and will monitor resp status, insert central line and art line.   DA not improving BP, change to levophed. Stat echo to eval EF and possible effusion.   Theodore DemarkRhonda Barrett, PA-C 09/01/2014 3:29 PM Beeper [phone]  Patient seen with PA, agree with the above note.    Early this afternoon, she developed hypotension with SBP 70s-80s and increasing dyspnea/hypoxemia.  She is volume overloaded on exam.  We now have her on dopamine peripherally.  She has mild on and off chest pain that has not changed since admission.  She has intramural hematoma in the descending thoracic aorta beyond the subclavian, CT this morning showed no change.  Plan had been initial medical management and BP control.  She has no back pain.  I talked to Dr Laneta SimmersBartle who will evaluate again.  Rupture unlikely without back pain/worsening pain.  ? Over-control of BP, seemed to drop after getting afternoon hydralazine.   - Consulted CCM for CVL and art line, currently on dopamine gtt and will likely need norepinephrine.  - Volume overload  but cannot give Lasix right now with hypotension.  Will get CXR and place on Bipap.   - Will repeat limited echo to reassess EF and check troponin.   45 minutes critical care time.   Marca AnconaDalton Flavia Bruss 09/01/2014 3:38 PM

## 2014-09-02 ENCOUNTER — Inpatient Hospital Stay (HOSPITAL_COMMUNITY): Payer: Medicare Other

## 2014-09-02 DIAGNOSIS — J189 Pneumonia, unspecified organism: Secondary | ICD-10-CM

## 2014-09-02 DIAGNOSIS — I7100 Dissection of unspecified site of aorta: Secondary | ICD-10-CM

## 2014-09-02 LAB — GLUCOSE, CAPILLARY
GLUCOSE-CAPILLARY: 174 mg/dL — AB (ref 70–99)
GLUCOSE-CAPILLARY: 148 mg/dL — AB (ref 70–99)
Glucose-Capillary: 155 mg/dL — ABNORMAL HIGH (ref 70–99)
Glucose-Capillary: 144 mg/dL — ABNORMAL HIGH (ref 70–99)

## 2014-09-02 LAB — PROCALCITONIN: Procalcitonin: 0.14 ng/mL

## 2014-09-02 LAB — PHOSPHORUS: PHOSPHORUS: 4 mg/dL (ref 2.3–4.6)

## 2014-09-02 LAB — CBC
HEMATOCRIT: 30.4 % — AB (ref 36.0–46.0)
Hemoglobin: 9.8 g/dL — ABNORMAL LOW (ref 12.0–15.0)
MCH: 28.9 pg (ref 26.0–34.0)
MCHC: 32.2 g/dL (ref 30.0–36.0)
MCV: 89.7 fL (ref 78.0–100.0)
Platelets: 216 10*3/uL (ref 150–400)
RBC: 3.39 MIL/uL — ABNORMAL LOW (ref 3.87–5.11)
RDW: 15 % (ref 11.5–15.5)
WBC: 12.3 10*3/uL — ABNORMAL HIGH (ref 4.0–10.5)

## 2014-09-02 LAB — MAGNESIUM: Magnesium: 2.1 mg/dL (ref 1.5–2.5)

## 2014-09-02 LAB — BASIC METABOLIC PANEL
ANION GAP: 13 (ref 5–15)
BUN: 26 mg/dL — ABNORMAL HIGH (ref 6–23)
CALCIUM: 9.2 mg/dL (ref 8.4–10.5)
CO2: 26 mEq/L (ref 19–32)
CREATININE: 1.13 mg/dL — AB (ref 0.50–1.10)
Chloride: 100 mEq/L (ref 96–112)
GFR calc Af Amer: 50 mL/min — ABNORMAL LOW (ref >90)
GFR calc non Af Amer: 43 mL/min — ABNORMAL LOW (ref >90)
Glucose, Bld: 163 mg/dL — ABNORMAL HIGH (ref 70–99)
Potassium: 4.2 mEq/L (ref 3.7–5.3)
Sodium: 139 mEq/L (ref 137–147)

## 2014-09-02 LAB — POCT I-STAT 3, ART BLOOD GAS (G3+)
Bicarbonate: 25.6 mEq/L — ABNORMAL HIGH (ref 20.0–24.0)
O2 Saturation: 99 %
PH ART: 7.382 (ref 7.350–7.450)
PO2 ART: 126 mmHg — AB (ref 80.0–100.0)
Patient temperature: 97.4
TCO2: 27 mmol/L (ref 0–100)
pCO2 arterial: 42.8 mmHg (ref 35.0–45.0)

## 2014-09-02 LAB — TROPONIN I: Troponin I: <0.3 ng/mL (ref <0.30)

## 2014-09-02 MED ORDER — FUROSEMIDE 10 MG/ML IJ SOLN
40.0000 mg | Freq: Every day | INTRAMUSCULAR | Status: DC
  Administered 2014-09-02 – 2014-09-03 (×2): 40 mg via INTRAVENOUS
  Filled 2014-09-02 (×3): qty 4

## 2014-09-02 MED ORDER — VANCOMYCIN HCL IN DEXTROSE 1-5 GM/200ML-% IV SOLN
1000.0000 mg | INTRAVENOUS | Status: DC
  Administered 2014-09-02 – 2014-09-03 (×2): 1000 mg via INTRAVENOUS
  Filled 2014-09-02 (×3): qty 200

## 2014-09-02 MED ORDER — PIPERACILLIN-TAZOBACTAM 3.375 G IVPB
3.3750 g | Freq: Three times a day (TID) | INTRAVENOUS | Status: DC
  Administered 2014-09-02 – 2014-09-04 (×6): 3.375 g via INTRAVENOUS
  Filled 2014-09-02 (×8): qty 50

## 2014-09-02 NOTE — Progress Notes (Signed)
    Subjective:  Dyspnea improved from last PM; No CP   Objective:  Filed Vitals:   09/02/14 0400 09/02/14 0500 09/02/14 0600 09/02/14 0700  BP: 151/67     Pulse: 73 75 77 75  Temp:      TempSrc:      Resp: 23 24 24 20   Height:      Weight:      SpO2: 97% 95% 88% 96%    Intake/Output from previous day:  Intake/Output Summary (Last 24 hours) at 09/02/14 0735 Last data filed at 09/02/14 0700  Gross per 24 hour  Intake 277.42 ml  Output      0 ml  Net 277.42 ml    Physical Exam: Physical exam: Well-developed well-nourished in no acute distress.  Skin is warm and dry.  HEENT is normal.  Neck is supple.  Chest with dimiinished BS bases (L>R) Cardiovascular exam is regular rate and rhythm. 2+ pulses carotid, radial, and femoral Abdominal exam nontender or distended. No masses palpated. Extremities show no edema. neuro grossly intact    Lab Results: Basic Metabolic Panel:  Recent Labs  16/09/9609/01/15 0400 09/02/14 0341  NA 136* 139  K 4.7 4.2  CL 100 100  CO2 23 26  GLUCOSE 147* 163*  BUN 27* 26*  CREATININE 1.09 1.13*  CALCIUM 9.1 9.2  MG  --  2.1  PHOS  --  4.0   CBC:  Recent Labs  09/01/14 0400 09/02/14 0341  WBC 8.0 12.3*  HGB 9.3* 9.8*  HCT 28.9* 30.4*  MCV 90.6 89.7  PLT 199 216     Assessment/Plan:  1 thoracic aortic aneurysm with intramural hematoma involving descending thoracic aorta-Patient's CP resolved and CT unchanged yesterday. Dr Laneta SimmersBartle has reviewed; ascending aorta not involved; continue medical therapy for now with tight BP control; she will be a candidate for stent but plan to wait for one month as long as pt stable.  2 hypertension-patient developed hypotension yesterday with increased BP meds; she transiently required [facility] will resume labetalol at 200 mg BID and hold plendil; follow BP and adjust based on FU readings; goal SBP < 120. 3 diabetes mellitus-follow CBGs. 4 hyperlipidemia-Continue present meds. 5 acute  kidney failure-BUN and CR stable this AM; follow given recent dye and hypotension. 6 Acute diastolic CHF-patient remains volume overloaded; IVFs DCed yesterday; gentle diuresis today and follow renal function.    Olga MillersBrian Crenshaw 09/02/2014, 7:35 AM

## 2014-09-02 NOTE — Progress Notes (Addendum)
   Very small, loculated fluid collection, completely collapses w/ respiratory variation. Left lateral chest. Not free flowing.   Anders SimmondsPete Babcock ACNP-BC [facility] Pager # [phone] OR # [phone] if no answer   Billy Fischeravid Simonds, MD ; [facility] Mobile [phone].  After 5:30 PM or weekends, call [phone]

## 2014-09-02 NOTE — Progress Notes (Signed)
ANTIBIOTIC CONSULT NOTE - INITIAL  Pharmacy Consult for Vancomycin and Zosyn Indication: rule out pneumonia  No Known Allergies  Patient Measurements: Height: 5\' 3"  (160 cm) Weight: 156 lb 8.4 oz (71 kg) IBW/kg (Calculated) : 52.4  Vital Signs: Temp: 98.8 F (37.1 C) (10/02 0744) Temp Source: Oral (10/02 0744) BP: 155/64 mmHg (10/02 0928) Pulse Rate: 77 (10/02 0928) Intake/Output from previous day: 10/01 0701 - 10/02 0700 In: 277.4 [P.O.:60; I.V.:217.4] Out: 75 [Urine:75] Intake/Output from this shift: Total I/O In: 20 [I.V.:20] Out: -   Labs:  Recent Labs  08/31/14 0300 09/01/14 0400 09/02/14 0341  WBC 8.7 8.0 12.3*  HGB 9.3* 9.3* 9.8*  PLT 197 199 216  CREATININE 1.16* 1.09 1.13*   Estimated Creatinine Clearance: 33.7 ml/min (by C-G formula based on Cr of 1.13). No results found for this basename: VANCOTROUGH, Leodis BinetVANCOPEAK, VANCORANDOM, GENTTROUGH, GENTPEAK, GENTRANDOM, TOBRATROUGH, TOBRAPEAK, TOBRARND, AMIKACINPEAK, AMIKACINTROU, AMIKACIN,  in the last 72 hours   Microbiology: Recent Results (from the past 720 hour(s))  MRSA PCR SCREENING     Status: None   Collection Time    08/28/14  6:03 PM      Result Value Ref Range Status   MRSA by PCR NEGATIVE  NEGATIVE Final   Comment:            The GeneXpert MRSA Assay (FDA     approved for NASAL specimens     only), is one component of a     comprehensive MRSA colonization     surveillance program. It is not     intended to diagnose MRSA     infection nor to guide or     monitor treatment for     MRSA infections.    Medical History: Past Medical History  Diagnosis Date  . Diabetes mellitus without complication   . Hypertension   . AAA (abdominal aortic aneurysm)-repaired     Medications:  Scheduled:  . antiseptic oral rinse  7 mL Mouth Rinse BID  . calcium carbonate  1 tablet Oral BID WC  . furosemide  40 mg Intravenous Once  . furosemide  40 mg Intravenous Daily  . gemfibrozil  600 mg Oral BID AC   . insulin aspart  0-5 Units Subcutaneous QHS  . insulin aspart  0-9 Units Subcutaneous TID WC  . labetalol  200 mg Oral BID  . polyvinyl alcohol  1 drop Both Eyes BID   Infusions:  . sodium chloride 10 mL/hr at 09/01/14 1545   Assessment: 88 yoF here w/ CP and noted w/ thoracic aortic aneurysm with intramural hematoma involving descending thoracic aorta. Now to start vanc + zosyn for possible HCAP. Pt is afebrile but WBC have trended up. SCr is stable at 1.13 (CrCl ~33 ml/min).   Goal of Therapy:  Vancomycin trough level 15-20 mcg/ml  Plan:  - Start vancomycin 1000 mg IV q24h - Start Zosyn 3.375 gm IV q8h (4hr infusion) - Monitor renal function, temp, WBC, C&S, VT as indicated  Margie BilletErika K. von Vajna, PharmD Clinical Pharmacist - Resident Pager: [phone] Pharmacy: [phone] 09/02/2014 11:34 AM

## 2014-09-02 NOTE — Progress Notes (Signed)
PULMONARY / CRITICAL CARE MEDICINE   Name: Jennifer [name] MRN: [id number] DOB: 06/28/1925    ADMISSION DATE:  08/28/2014 CONSULTATION DATE:  09/01/2014  REFERRING MD :  Dr. Graciela HusbandsKlein   CHIEF COMPLAINT:  Chest pain  INITIAL PRESENTATION: 80 yo female presented to ED 9/27 with chest pain. She was admitted with suspicion for aortic aneurysm vs PE. A CT of the chest showed acute intramural hemorrhage of the descending thoracic aorta.  STUDIES:  9/27 CT chest  > Acute intramural hemorrhage of the descending thoracic aorta with an associated mid descending thoracic aortic 5 cm aneurysm. Stable 8 mm right middle lobe well-circumscribed nodule  10/1 CT chest  > Stable appearance of intramural hemorrhage in the descending thoracic aorta and 5.2 cm mid descending fusiform aneurysm. This results in compression of the adjacent superior left pulmonary vein. New pleural effusions left greater than right. 11 mm right lower lobe pulmonary nodule, previously 8 mm   SIGNIFICANT EVENTS: 9/27 admitted 10/1 acute decompensation    SUBJECTIVE:  No acute distress but appears fatigued. No new complaints VITAL SIGNS: Temp:  [97.7 F (36.5 C)-99.5 F (37.5 C)] 98.5 F (36.9 C) (10/02 1600) Pulse Rate:  [65-77] 65 (10/02 1800) Resp:  [14-27] 16 (10/02 1800) BP: (146-161)/(57-94) 161/75 mmHg (10/02 1600) SpO2:  [88 %-100 %] 97 % (10/02 1800) Arterial Line BP: (152-169)/(63-72) 164/70 mmHg (10/02 1800) FiO2 (%):  [55 %] 55 % (10/02 0800) Weight:  [71 kg (156 lb 8.4 oz)] 71 kg (156 lb 8.4 oz) (10/02 0358) HEMODYNAMICS: CVP:  [5 mmHg-10 mmHg] 7 mmHg VENTILATOR SETTINGS: Vent Mode:  [-]  FiO2 (%):  [55 %] 55 % INTAKE / OUTPUT:  Intake/Output Summary (Last 24 hours) at 09/02/14 1914 Last data filed at 09/02/14 1900  Gross per 24 hour  Intake    640 ml  Output    400 ml  Net    240 ml    PHYSICAL EXAMINATION: General: NAD Neuro:  nonfocal HEENT: WNL Cardiovascular:  RRR, no M Lungs: Bronchial BS in  L base Abdomen:  Soft, non-tender, non-distended Ext: warm, no edema  LABS:  CBC  Recent Labs Lab 08/31/14 0300 09/01/14 0400 09/02/14 0341  WBC 8.7 8.0 12.3*  HGB 9.3* 9.3* 9.8*  HCT 29.3* 28.9* 30.4*  PLT 197 199 216   Coag's  Recent Labs Lab 08/28/14 1935  APTT 28  INR 1.03   BMET  Recent Labs Lab 08/31/14 0300 09/01/14 0400 09/02/14 0341  NA 135* 136* 139  K 4.6 4.7 4.2  CL 98 100 100  CO2 24 23 26   BUN 31* 27* 26*  CREATININE 1.16* 1.09 1.13*  GLUCOSE 146* 147* 163*   Electrolytes  Recent Labs Lab 08/28/14 1935  08/31/14 0300 09/01/14 0400 09/02/14 0341  CALCIUM  --   < > 9.4 9.1 9.2  MG 1.7  --   --   --  2.1  PHOS  --   --   --   --  4.0  < > = values in this interval not displayed. Sepsis Markers  Recent Labs Lab 09/02/14 0341  PROCALCITON 0.14   ABG  Recent Labs Lab 09/01/14 1622  PHART 7.382  PCO2ART 42.8  PO2ART 126.0*   Liver Enzymes  Recent Labs Lab 08/28/14 0844  AST 13  ALT 11  ALKPHOS 50  BILITOT 0.2*  ALBUMIN 4.2   Cardiac Enzymes  Recent Labs Lab 09/01/14 1541 09/01/14 2141 09/02/14 0341  TROPONINI <0.30 <0.30 <0.30   Glucose  Recent Labs Lab 09/01/14 1211 09/01/14 1712 09/01/14 2000 09/02/14 0750 09/02/14 1244 09/02/14 1630  GLUCAP 129* 176* 152* 174* 148* 155*    CXR: LLL consolidation, decreased L effusion   ASSESSMENT / PLAN:  PULMONARY A: Acute hypoxemic respiratory failure Pulm edema, improved L effusion, improving Suspect LLL PNA P:   Supplemental O2 to maintain SpO2 > 92% Chest [place] to assess L effusion for possible thoracentesis - done 10/02. Too small to tap  CARDIOVASCULAR CVL LIJ 10/1 >>> R Rad Art line 10/1 >>  A:  Shock, resolved Acute intramural hemorrhage of the descending thoracic aorta  H/o HTN P:  Further eval and mgmt per Cards  RENAL A:   No acute issues  P:   Monitor BMET intermittently Monitor I/Os Correct electrolytes as  indicated   GASTROINTESTINAL A:   No acute issues  P:   SUP: N/I Advance diet as tolerated  HEMATOLOGIC A:   Anemia, acute - no overt bleeding presently P:  Trend CBC Transfuse for Hgb < 7  INFECTIOUS A:   Suspect HCAP - LLL consolidation, low grade fever, mild leukocytosis P:   PCT protocol Vanc 10/02 >>  Pip-tazo 10/02 >>  Follow CXR  ENDOCRINE A:   DM    P:   CBG SSI  NEUROLOGIC A:  Acute encephalopathy d/t hypoxia, hypotension  P:   RASS goal: 0 Monitor Hold sedating meds (xanax)    Jennifer Fischer, MD ; [facility] service Mobile [phone].  After 5:30 PM or weekends, call [phone]

## 2014-09-02 NOTE — Clinical Documentation Improvement (Signed)
  Consult Note 10/1 documents "Acute intramural hemorrhage of the descending thoracic aorta" with "acute anemia" treated with "trend CBC", H/H: 9/27 12.0/37.6 to 10/1 9.3/28.9. Please clarify if this equated to "Acute blood loss anemia" to reflect severity of illness and risk of mortality.   Thank Bonita QuinYou, Clide CliffLaurie Skyy Mcknight RN CDI [phone] HIM dept. [phone] Cell

## 2014-09-03 DIAGNOSIS — I712 Thoracic aortic aneurysm, without rupture: Secondary | ICD-10-CM

## 2014-09-03 DIAGNOSIS — Q268 Other congenital malformations of great veins: Secondary | ICD-10-CM

## 2014-09-03 DIAGNOSIS — I10 Essential (primary) hypertension: Secondary | ICD-10-CM

## 2014-09-03 LAB — GLUCOSE, CAPILLARY
GLUCOSE-CAPILLARY: 153 mg/dL — AB (ref 70–99)
Glucose-Capillary: 132 mg/dL — ABNORMAL HIGH (ref 70–99)
Glucose-Capillary: 217 mg/dL — ABNORMAL HIGH (ref 70–99)

## 2014-09-03 LAB — CBC
HEMATOCRIT: 30.9 % — AB (ref 36.0–46.0)
Hemoglobin: 9.9 g/dL — ABNORMAL LOW (ref 12.0–15.0)
MCH: 28.7 pg (ref 26.0–34.0)
MCHC: 32 g/dL (ref 30.0–36.0)
MCV: 89.6 fL (ref 78.0–100.0)
Platelets: 243 10*3/uL (ref 150–400)
RBC: 3.45 MIL/uL — AB (ref 3.87–5.11)
RDW: 15.1 % (ref 11.5–15.5)
WBC: 12 10*3/uL — ABNORMAL HIGH (ref 4.0–10.5)

## 2014-09-03 LAB — BASIC METABOLIC PANEL
Anion gap: 14 (ref 5–15)
BUN: 26 mg/dL — AB (ref 6–23)
CALCIUM: 8.8 mg/dL (ref 8.4–10.5)
CHLORIDE: 102 meq/L (ref 96–112)
CO2: 25 meq/L (ref 19–32)
Creatinine, Ser: 1.08 mg/dL (ref 0.50–1.10)
GFR calc Af Amer: 52 mL/min — ABNORMAL LOW (ref >90)
GFR calc non Af Amer: 45 mL/min — ABNORMAL LOW (ref >90)
Glucose, Bld: 146 mg/dL — ABNORMAL HIGH (ref 70–99)
Potassium: 3.6 mEq/L — ABNORMAL LOW (ref 3.7–5.3)
Sodium: 141 mEq/L (ref 137–147)

## 2014-09-03 LAB — PROCALCITONIN: Procalcitonin: 0.13 ng/mL

## 2014-09-03 MED ORDER — FELODIPINE ER 2.5 MG PO TB24
2.5000 mg | ORAL_TABLET | Freq: Every day | ORAL | Status: DC
  Administered 2014-09-03: 2.5 mg via ORAL
  Filled 2014-09-03 (×2): qty 1

## 2014-09-03 MED ORDER — SODIUM CHLORIDE 0.9 % IJ SOLN
10.0000 mL | Freq: Two times a day (BID) | INTRAMUSCULAR | Status: DC
  Administered 2014-09-03: 20 mL
  Administered 2014-09-04 – 2014-09-12 (×9): 10 mL

## 2014-09-03 MED ORDER — LABETALOL HCL 200 MG PO TABS
200.0000 mg | ORAL_TABLET | Freq: Three times a day (TID) | ORAL | Status: DC
  Administered 2014-09-03 (×3): 200 mg via ORAL
  Filled 2014-09-03 (×6): qty 1

## 2014-09-03 MED ORDER — SODIUM CHLORIDE 0.9 % IJ SOLN: 10.0000 mL | INTRAMUSCULAR | Status: DC | PRN

## 2014-09-03 NOTE — Progress Notes (Signed)
Pt c/o 8/10 CP described as pressure in the middle of her chest. Pt is unsure if it is r/t to rolling off and on bedpan all night. Denies SOB and back pain. Pt said pain does not feel like the same pain that brought her into the hospital. EKG done. PRN 2mg  Morphine given. Cardiology fellow paged. Vital signs and heart and lung sounds unchanged. Will continue to monitor and assess pt status/

## 2014-09-03 NOTE — Progress Notes (Addendum)
PULMONARY / CRITICAL CARE MEDICINE   Name: Jennifer [name] MRN: [id number] DOB: [birth year]    ADMISSION DATE:  08/28/2014 CONSULTATION DATE:  09/01/2014  REFERRING MD :  Dr. Graciela Husbands   CHIEF COMPLAINT:  Chest pain  INITIAL PRESENTATION: 80 yo female presented to ED 9/27 with chest pain. She was admitted with suspicion for aortic aneurysm vs PE. A CT of the chest showed acute intramural hemorrhage of the descending thoracic aorta.  STUDIES:  9/27 CT chest  > Acute intramural hemorrhage of the descending thoracic aorta with an associated mid descending thoracic aortic 5 cm aneurysm. Stable 8 mm right middle lobe well-circumscribed nodule  10/1 CT chest  > Stable appearance of intramural hemorrhage in the descending thoracic aorta and 5.2 cm mid descending fusiform aneurysm. This results in compression of the adjacent superior left pulmonary vein. New pleural effusions left greater than right. 11 mm right lower lobe pulmonary nodule, previously 8 mm   SIGNIFICANT EVENTS: 9/27 admitted 10/1 acute decompensation    SUBJECTIVE:  CP early am  VITAL SIGNS: Temp:  [97.7 F (36.5 C)-98.5 F (36.9 C)] 97.8 F (36.6 C) (10/03 0722) Pulse Rate:  [65-77] 77 (10/03 0722) Resp:  [14-27] 22 (10/03 0722) BP: (135-174)/(64-94) 153/80 mmHg (10/03 0722) SpO2:  [93 %-100 %] 96 % (10/03 0722) Arterial Line BP: (85-169)/(56-72) 163/70 mmHg (10/03 0700) Weight:  [68.1 kg (150 lb 2.1 oz)] 68.1 kg (150 lb 2.1 oz) (10/03 0300) HEMODYNAMICS: CVP:  [6 mmHg-10 mmHg] 6 mmHg VENTILATOR SETTINGS:   INTAKE / OUTPUT:  Intake/Output Summary (Last 24 hours) at 09/03/14 0817 Last data filed at 09/03/14 0700  Gross per 24 hour  Intake    780 ml  Output    975 ml  Net   -195 ml    PHYSICAL EXAMINATION: General: NAD Neuro:  nonfocal HEENT: WNL Cardiovascular:  RRR, no M Lungs: Bronchial BS in L base Abdomen:  Soft, non-tender, non-distended Ext: warm, no edema  LABS:  CBC  Recent Labs Lab  09/01/14 0400 09/02/14 0341 09/03/14 0400  WBC 8.0 12.3* 12.0*  HGB 9.3* 9.8* 9.9*  HCT 28.9* 30.4* 30.9*  PLT 199 216 243   Coag's  Recent Labs Lab 08/28/14 1935  APTT 28  INR 1.03   BMET  Recent Labs Lab 09/01/14 0400 09/02/14 0341 09/03/14 0400  NA 136* 139 141  K 4.7 4.2 3.6*  CL 100 100 102  CO2 23 26 25   BUN 27* 26* 26*  CREATININE 1.09 1.13* 1.08  GLUCOSE 147* 163* 146*   Electrolytes  Recent Labs Lab 08/28/14 1935  09/01/14 0400 09/02/14 0341 09/03/14 0400  CALCIUM  --   < > 9.1 9.2 8.8  MG 1.7  --   --  2.1  --   PHOS  --   --   --  4.0  --   < > = values in this interval not displayed. Sepsis Markers  Recent Labs Lab 09/02/14 0341 09/03/14 0400  PROCALCITON 0.14 0.13   ABG  Recent Labs Lab 09/01/14 1622  PHART 7.382  PCO2ART 42.8  PO2ART 126.0*   Liver Enzymes  Recent Labs Lab 08/28/14 0844  AST 13  ALT 11  ALKPHOS 50  BILITOT 0.2*  ALBUMIN 4.2   Cardiac Enzymes  Recent Labs Lab 09/01/14 1541 09/01/14 2141 09/02/14 0341  TROPONINI <0.30 <0.30 <0.30   Glucose  Recent Labs Lab 09/01/14 1712 09/01/14 2000 09/02/14 0750 09/02/14 1244 09/02/14 1630 09/02/14 2116  GLUCAP 176* 152* 174* 148*  155* 144*    CXR: LLL consolidation, decreased L effusion  EKG - RBBB + LAHB  ASSESSMENT / PLAN:  PULMONARY A: Acute hypoxemic respiratory failure Pulm edema, improved L effusion, improving Suspect LLL PNA P:   Supplemental O2 to maintain SpO2 > 92% Chest US - L effusion 10/02. Too small to tap  CARDIOVASCULAR CVL LIJ 10/1 >>> R Rad Art line 10/1 >>  A:  Shock, resolved Acute intramural hemorrhage of the descending thoracic aorta ? Pulm vein compression H/o HTN Acute diastolic CHF P:  Bp control - felodipin + labetalol -per Cards Lasix 40 daily  RENAL A:   No acute issues  P:   Monitor BMET intermittently Monitor I/Os Correct electrolytes as indicated   GASTROINTESTINAL A:   No acute  issues  P:   SUP: N/I Advance diet as tolerated  HEMATOLOGIC A:   Anemia, acute - no overt bleeding presently P:  Trend CBC Transfuse for Hgb < 7  INFECTIOUS A:   Suspect HCAP - LLL consolidation, low grade fever, mild leukocytosis P:   Vanc 10/02 >>  Pip-tazo 10/02 >>   Low pct - stop abx soon  ENDOCRINE A:   DM    P:   CBG SSI  NEUROLOGIC A:  Acute encephalopathy d/t hypoxia, hypotension  P:   RASS goal: 0 Monitor Hold sedating meds (xanax)   Summary - ct ICU monitoring due to ongoing chest pain & invasive Bp monitoring  Care during the described time interval was provided by me and/or other providers on the critical care team.  I have reviewed this patient's available data, including medical history, events of note, physical examination and test results as part of my evaluation  cctime x 31 m  Oretha MilchALVA,Nechelle Petrizzo V. MD

## 2014-09-03 NOTE — Progress Notes (Signed)
Patient Name: Jennifer [name] Date of Encounter: 09/03/2014  Active Problems:   Intramural aortic hematoma   Acute respiratory failure with hypoxia   Acute pulmonary edema   Cardiogenic shock   Length of Stay: 6  SUBJECTIVE  Less dyspneic. Required opiates for chest pain relief earlier, now asymptomatic. BP much higher today - have increased labetalol to TID and added back a lower dose of felodipine  CURRENT MEDS . antiseptic oral rinse  7 mL Mouth Rinse BID  . calcium carbonate  1 tablet Oral BID WC  . felodipine  2.5 mg Oral Daily  . furosemide  40 mg Intravenous Once  . furosemide  40 mg Intravenous Daily  . gemfibrozil  600 mg Oral BID AC  . insulin aspart  0-5 Units Subcutaneous QHS  . insulin aspart  0-9 Units Subcutaneous TID WC  . labetalol  200 mg Oral TID  . piperacillin-tazobactam (ZOSYN)  IV  3.375 g Intravenous 3 times per day  . polyvinyl alcohol  1 drop Both Eyes BID  . vancomycin  1,000 mg Intravenous Q24H    OBJECTIVE   Intake/Output Summary (Last 24 hours) at 09/03/14 0803 Last data filed at 09/03/14 0700  Gross per 24 hour  Intake    780 ml  Output    975 ml  Net   -195 ml   Filed Weights   09/01/14 0530 09/02/14 0358 09/03/14 0300  Weight: 69.5 kg (153 lb 3.5 oz) 71 kg (156 lb 8.4 oz) 68.1 kg (150 lb 2.1 oz)    PHYSICAL EXAM Filed Vitals:   09/03/14 0530 09/03/14 0600 09/03/14 0700 09/03/14 0722  BP:    153/80  Pulse: 72 74 75 77  Temp:    97.8 F (36.6 C)  TempSrc:    Oral  Resp: 20 19 23 22   Height:      Weight:      SpO2: 96% 95% 96% 96%   General: Alert, oriented x3, no distress Head: no evidence of trauma, PERRL, EOMI, no exophtalmos or lid lag, no myxedema, no xanthelasma; normal ears, nose and oropharynx Neck: normal jugular venous pulsations and no hepatojugular reflux; brisk carotid pulses without delay and no carotid bruits Chest: reduced breath sounds and egophony in left base, symmetrical and full respiratory  excursions Cardiovascular: normal position and quality of the apical impulse, regular rhythm, normal first and second heart sounds, no rubs or gallops, no murmur Abdomen: no tenderness or distention, no masses by palpation, no abnormal pulsatility or arterial bruits, normal bowel sounds, no hepatosplenomegaly Extremities: no clubbing, cyanosis or edema; 2+ radial, ulnar and brachial pulses bilaterally; 2+ right femoral, posterior tibial and dorsalis pedis pulses; 2+ left femoral, posterior tibial and dorsalis pedis pulses; no subclavian or femoral bruits Neurological: grossly nonfocal  LABS  CBC  Recent Labs  09/02/14 0341 09/03/14 0400  WBC 12.3* 12.0*  HGB 9.8* 9.9*  HCT 30.4* 30.9*  MCV 89.7 89.6  PLT 216 243   Basic Metabolic Panel  Recent Labs  09/01/14 0400 09/02/14 0341 09/03/14 0400  NA 136* 139 141  K 4.7 4.2 3.6*  CL 100 100 102  CO2 23 26 25   GLUCOSE 147* 163* 146*  BUN 27* 26* 26*  CREATININE 1.09 1.13* 1.08  CALCIUM 9.1 9.2 8.8  MG  --  2.1  --   PHOS  --  4.0  --   Cardiac Enzymes  Recent Labs  09/01/14 1541 09/01/14 2141 09/02/14 0341  TROPONINI <0.30 <0.30 <0.30  Radiology Studies Imaging results have been reviewed and Dg Chest Port 1 View  09/02/2014   CLINICAL DATA:  Acute hypoxic respiratory failure, intramural aortic hematoma  EXAM: PORTABLE CHEST - 1 VIEW  COMPARISON:  09/01/2014  FINDINGS: Cardiac shadow remains mildly enlarged. A left jugular central line is again seen projecting over the left innominate vein. Right lung demonstrates improved aeration with only minimal residual atelectasis. The large left-sided pleural effusion has decreased in the interval from the prior exam. Persistent left lower lobe consolidation is noted.  IMPRESSION: Improvement in left pleural effusion. Persistent left lower lobe consolidation is noted.  Improved aeration in the right lung base.   Electronically Signed   By: Alcide CleverMark  Lukens M.D.   On: 09/02/2014 08:09    Dg Chest Port 1 View  09/01/2014   CLINICAL DATA:  New central line placement.  Hypoxia.  EXAM: PORTABLE CHEST - 1 VIEW  COMPARISON:  Multiple exams, including 09/01/2014 CT scan  FINDINGS: The left IJ catheter projects over the aorta but based on its curvature high finding more likely to be in the brachiocephalic vein. A lateral projection or CT chest could confirm this. No pneumothorax.  Large left pleural effusion with passive atelectasis in the left hemithorax. The patient has a known thoracic aortic aneurysm. Bandlike opacity at the right lung base.  IMPRESSION: 1. Left IJ line tip is strongly prefer to be in the brachiocephalic vein. 2. Large left pleural effusion with considerable passive atelectasis. Bandlike atelectasis at the right lung base. No pneumothorax observed.   Electronically Signed   By: Herbie BaltimoreWalt  Liebkemann M.D.   On: 09/01/2014 17:01   Ct Angio Chest Aortic Dissect W &/or W/o  09/01/2014   CLINICAL DATA:  Intramural hematoma, thoracic aortic aneurysm  EXAM: CT ANGIOGRAPHY CHEST WITH CONTRAST  TECHNIQUE: Multidetector CT imaging of the chest was performed using the standard protocol during bolus administration of intravenous contrast. Multiplanar CT image reconstructions and MIPs were obtained to evaluate the vascular anatomy.  CONTRAST:  80mL OMNIPAQUE IOHEXOL 350 MG/ML SOLN  COMPARISON:  08/28/2014  FINDINGS: Contrast reflux from the right atrium into hepatic veins. There good contrast opacification of pulmonary artery branches with no filling defects to suggest acute PE. There is compression of the superior left pulmonary vein by at the descending thoracic aortic aneurysm. Mild plaque in the ascending aorta. Classic 3 vessel brachiocephalic arterial origin anatomy without proximal stenosis.  Hyperdense intramural hematoma in the distal arch beyond the subclavian origin extending into the distal descending thoracic segment. There is scattered plaque and displaced intimal calcifications.  Fusiform aneurysmal dilatation of the mid descending thoracic aorta, 5.1 x 5.2 cm maximum transverse dimensions, stable since previous study, tapering to a diameter of 3 cm, tortuous in its distal descending segment above the diaphragm. The intramural hematoma does not extend below the diaphragm. The visualized proximal abdominal aorta shows moderate atheromatous change without dissection, aneurysm, or stenosis. Origin stenosis of the celiac axis with distal aneurysmal dilatation to 17 mm.  New bilateral pleural effusions left greater than right. No pericardial effusion. Mild four-chamber cardiac enlargement. No hilar or mediastinal adenopathy identified. There is consolidation/ atelectasis in the posterior left lower lobe. Dependent atelectasis in the right lower lobe and left upper lobe. 11mm nodule, right middle lobe image 65/6, previously 8 mm on 06/22/2010.  Review of the MIP images confirms the above findings.  IMPRESSION: 1. Stable appearance of intramural hemorrhage in the descending thoracic aorta and 5.2 cm mid descending fusiform aneurysm. This results  in compression of the adjacent superior left pulmonary vein. 2. New pleural effusions left greater than right, with consolidation/atelectasis in the posterior left lower lobe 3. 11 mm right lower lobe pulmonary nodule, previously 8 mm on 06/22/2010. Follow-up recommended to exclude low-grade neoplasm.   Electronically Signed   By: Oley Balm M.D.   On: 09/01/2014 13:26    TELE NSR  ECG NSR, RBBB, LAFB  ASSESSMENT AND PLAN 1 thoracic aortic aneurysm with ACUTE intramural hematoma involving descending thoracic aorta- improved, but still occasional pain and CT unchanged yesterday. Dr Laneta Simmers has reviewed; ascending aorta not involved; continue medical therapy for now with tight BP control; she will be a candidate for stent but plan to wait for one month as long as pt stable.  2 hypertension-patient developed hypotension yesterday with increased  BP meds; she transiently required pressors now DCedBP much higher now. Increase labetalol TID and add felodipine lower dose; goal SBP < 120.  3 diabetes mellitus-follow CBGs.  4 hyperlipidemia-Continue present meds.  5 acute kidney failure-resolved.  6 Acute diastolic CHF-patient remains volume overloaded; I think it is possible she has lobar pulmonary edema due to LUPV stenosis (extrinsic compression by aneurysm is quite severe) and secondary pleural effusion. She still requires high flow O2 by nasal cannula and will have significant V/Q mismatch if that is the true situation. Diuretics will only help partially.  7 Multivessel coronary calcification on CT - will require evaluation for CAD (lexiscan Myoview when more stable? Coronary angio via right radial approach?)   Thurmon Fair, MD, [facility] [phone] office [phone] pager 09/03/2014 8:03 AM

## 2014-09-04 LAB — CBC
HEMATOCRIT: 31.1 % — AB (ref 36.0–46.0)
Hemoglobin: 10 g/dL — ABNORMAL LOW (ref 12.0–15.0)
MCH: 28.9 pg (ref 26.0–34.0)
MCHC: 32.2 g/dL (ref 30.0–36.0)
MCV: 89.9 fL (ref 78.0–100.0)
PLATELETS: 269 10*3/uL (ref 150–400)
RBC: 3.46 MIL/uL — ABNORMAL LOW (ref 3.87–5.11)
RDW: 15.1 % (ref 11.5–15.5)
WBC: 10.8 10*3/uL — ABNORMAL HIGH (ref 4.0–10.5)

## 2014-09-04 LAB — GLUCOSE, CAPILLARY
GLUCOSE-CAPILLARY: 135 mg/dL — AB (ref 70–99)
GLUCOSE-CAPILLARY: 131 mg/dL — AB (ref 70–99)
Glucose-Capillary: 168 mg/dL — ABNORMAL HIGH (ref 70–99)
Glucose-Capillary: 148 mg/dL — ABNORMAL HIGH (ref 70–99)
Glucose-Capillary: 147 mg/dL — ABNORMAL HIGH (ref 70–99)
Glucose-Capillary: 151 mg/dL — ABNORMAL HIGH (ref 70–99)

## 2014-09-04 LAB — BASIC METABOLIC PANEL
ANION GAP: 14 (ref 5–15)
BUN: 26 mg/dL — ABNORMAL HIGH (ref 6–23)
CALCIUM: 8.9 mg/dL (ref 8.4–10.5)
CO2: 26 mEq/L (ref 19–32)
Chloride: 101 mEq/L (ref 96–112)
Creatinine, Ser: 1.11 mg/dL — ABNORMAL HIGH (ref 0.50–1.10)
GFR calc Af Amer: 51 mL/min — ABNORMAL LOW (ref >90)
GFR, EST NON AFRICAN AMERICAN: 44 mL/min — AB (ref >90)
Glucose, Bld: 142 mg/dL — ABNORMAL HIGH (ref 70–99)
Potassium: 3.4 mEq/L — ABNORMAL LOW (ref 3.7–5.3)
Sodium: 141 mEq/L (ref 137–147)

## 2014-09-04 LAB — PROCALCITONIN: Procalcitonin: 0.13 ng/mL

## 2014-09-04 MED ORDER — LABETALOL HCL 5 MG/ML IV SOLN
10.0000 mg | Freq: Once | INTRAVENOUS | Status: AC
  Administered 2014-09-04: 10 mg via INTRAVENOUS

## 2014-09-04 MED ORDER — HYDRALAZINE HCL 20 MG/ML IJ SOLN
10.0000 mg | INTRAMUSCULAR | Status: DC | PRN
  Administered 2014-09-06: 10 mg via INTRAVENOUS
  Filled 2014-09-04: qty 1

## 2014-09-04 MED ORDER — LABETALOL HCL 300 MG PO TABS
300.0000 mg | ORAL_TABLET | Freq: Three times a day (TID) | ORAL | Status: DC
  Administered 2014-09-04 (×3): 300 mg via ORAL
  Filled 2014-09-04 (×6): qty 1

## 2014-09-04 MED ORDER — POTASSIUM CHLORIDE CRYS ER 20 MEQ PO TBCR
40.0000 meq | EXTENDED_RELEASE_TABLET | Freq: Once | ORAL | Status: AC
  Administered 2014-09-04: 40 meq via ORAL

## 2014-09-04 MED ORDER — FELODIPINE ER 5 MG PO TB24
5.0000 mg | ORAL_TABLET | Freq: Every day | ORAL | Status: DC
  Administered 2014-09-04: 5 mg via ORAL
  Filled 2014-09-04 (×2): qty 1

## 2014-09-04 MED ORDER — POTASSIUM CHLORIDE ER 10 MEQ PO TBCR
20.0000 meq | EXTENDED_RELEASE_TABLET | Freq: Three times a day (TID) | ORAL | Status: DC
  Administered 2014-09-04 (×3): 20 meq via ORAL
  Filled 2014-09-04 (×7): qty 2

## 2014-09-04 MED ORDER — ALPRAZOLAM 0.25 MG PO TABS
0.2500 mg | ORAL_TABLET | Freq: Two times a day (BID) | ORAL | Status: DC | PRN
  Administered 2014-09-07 – 2014-09-11 (×6): 0.25 mg via ORAL
  Filled 2014-09-04 (×6): qty 1

## 2014-09-04 MED ORDER — DOCUSATE SODIUM 50 MG/5ML PO LIQD
100.0000 mg | Freq: Every day | ORAL | Status: DC
  Administered 2014-09-04 – 2014-09-05 (×2): 100 mg via ORAL
  Filled 2014-09-04 (×8): qty 10

## 2014-09-04 MED ORDER — LABETALOL HCL 5 MG/ML IV SOLN
10.0000 mg | INTRAVENOUS | Status: DC | PRN
  Filled 2014-09-04: qty 4

## 2014-09-04 MED ORDER — BISACODYL 5 MG PO TBEC: 10.0000 mg | DELAYED_RELEASE_TABLET | Freq: Every day | ORAL | Status: DC | PRN

## 2014-09-04 MED ORDER — FUROSEMIDE 40 MG PO TABS
40.0000 mg | ORAL_TABLET | Freq: Every day | ORAL | Status: DC
  Administered 2014-09-04: 40 mg via ORAL
  Filled 2014-09-04 (×2): qty 1

## 2014-09-04 NOTE — Progress Notes (Signed)
PULMONARY / CRITICAL CARE MEDICINE   Name: Jennifer [name] MRN: [id number] DOB: [birth year]    ADMISSION DATE:  08/28/2014 CONSULTATION DATE:  09/01/2014  REFERRING MD :  Dr. Graciela Husbands   CHIEF COMPLAINT:  Chest pain  INITIAL PRESENTATION: 80 yo female presented to ED 9/27 with chest pain. She was admitted with suspicion for aortic aneurysm vs PE. A CT of the chest showed acute intramural hemorrhage of the descending thoracic aorta.  STUDIES:  9/27 CT chest  > Acute intramural hemorrhage of the descending thoracic aorta with an associated mid descending thoracic aortic 5 cm aneurysm. Stable 8 mm right middle lobe well-circumscribed nodule  10/1 CT chest  > Stable appearance of intramural hemorrhage in the descending thoracic aorta and 5.2 cm mid descending fusiform aneurysm. This results in compression of the adjacent superior left pulmonary vein. New pleural effusions left greater than right. 11 mm right lower lobe pulmonary nodule, previously 8 mm   SIGNIFICANT EVENTS: 9/27 admitted 10/1 acute decompensation    SUBJECTIVE:  Appears anxious No BM Breathing ok CP better, but persists  VITAL SIGNS: Temp:  [97.9 F (36.6 C)-99 F (37.2 C)] 98.1 F (36.7 C) (10/04 0730) Pulse Rate:  [62-81] 67 (10/04 1033) Resp:  [13-24] 20 (10/04 0900) BP: (113-178)/(55-77) 143/58 mmHg (10/04 1033) SpO2:  [90 %-97 %] 97 % (10/04 0900) Arterial Line BP: (102-177)/(53-112) 153/88 mmHg (10/04 0900) Weight:  [68.1 kg (150 lb 2.1 oz)] 68.1 kg (150 lb 2.1 oz) (10/04 0500) HEMODYNAMICS: CVP:  [3 mmHg-7 mmHg] 5 mmHg VENTILATOR SETTINGS:   INTAKE / OUTPUT:  Intake/Output Summary (Last 24 hours) at 09/04/14 1042 Last data filed at 09/04/14 1040  Gross per 24 hour  Intake 1092.5 ml  Output    252 ml  Net  840.5 ml    PHYSICAL EXAMINATION: General: NAD Neuro:  nonfocal HEENT: WNL Cardiovascular:  RRR, no M Lungs:decreased BL Abdomen:  Soft, non-tender, non-distended Ext: warm, no  edema  LABS:  CBC  Recent Labs Lab 09/02/14 0341 09/03/14 0400 09/04/14 0502  WBC 12.3* 12.0* 10.8*  HGB 9.8* 9.9* 10.0*  HCT 30.4* 30.9* 31.1*  PLT 216 243 269   Coag's  Recent Labs Lab 08/28/14 1935  APTT 28  INR 1.03   BMET  Recent Labs Lab 09/02/14 0341 09/03/14 0400 09/04/14 0502  NA 139 141 141  K 4.2 3.6* 3.4*  CL 100 102 101  CO2 26 25 26   BUN 26* 26* 26*  CREATININE 1.13* 1.08 1.11*  GLUCOSE 163* 146* 142*   Electrolytes  Recent Labs Lab 08/28/14 1935  09/02/14 0341 09/03/14 0400 09/04/14 0502  CALCIUM  --   < > 9.2 8.8 8.9  MG 1.7  --  2.1  --   --   PHOS  --   --  4.0  --   --   < > = values in this interval not displayed. Sepsis Markers  Recent Labs Lab 09/02/14 0341 09/03/14 0400 09/04/14 0502  PROCALCITON 0.14 0.13 0.13   ABG  Recent Labs Lab 09/01/14 1622  PHART 7.382  PCO2ART 42.8  PO2ART 126.0*   Liver Enzymes No results found for this basename: AST, ALT, ALKPHOS, BILITOT, ALBUMIN,  in the last 168 hours Cardiac Enzymes  Recent Labs Lab 09/01/14 1541 09/01/14 2141 09/02/14 0341  TROPONINI <0.30 <0.30 <0.30   Glucose  Recent Labs Lab 09/02/14 2116 09/03/14 0724 09/03/14 1100 09/03/14 1645 09/03/14 2149 09/04/14 0729  GLUCAP 144* 132* 153* 217* 135* 168*  CXR: LLL consolidation, decreased L effusion  EKG - RBBB + LAHB  ASSESSMENT / PLAN:  PULMONARY A: Acute hypoxemic respiratory failure Pulm edema, improved L effusion, improving -Chest US - L effusion 10/02. Too small to tap Doubt  LLL PNA P:   Supplemental O2 to maintain SpO2 > 92%   CARDIOVASCULAR CVL LI[facility] 10/1 >>> R Rad Art line 10/1 >>  A:  Shock, resolved Acute intramural hemorrhage of the descending thoracic aorta ? Pulm vein compression H/o HTN Acute diastolic CHF P:  Bp control - felodipin + labetalol -per Cards, add hydralazine prn Lasix 40 daily  RENAL A:   No acute issues  P:   Monitor BMET intermittently Monitor  I/Os-not accurate without foley Correct electrolytes as indicated    INFECTIOUS A:   Suspect HCAP - LLL consolidation, low grade fever, mild leukocytosis P:   Vanc 10/02 >>  Pip-tazo 10/02 >>   Low pct - stop abx   ENDOCRINE A:   DM    P:   CBG SSI  NEUROLOGIC A:  Acute encephalopathy d/t hypoxia, hypotension  P:   RASS goal: 0 Monitor Resume xanax   Summary - ct ICU monitoring due to ongoing chest pain & invasive Bp monitoring  PCCM to sign off  Care during the described time interval was provided by me and/or other providers on the critical care team.  I have reviewed this patient's available data, including medical history, events of note, physical examination and test results as part of my evaluation   Oretha MilchALVA,Avaline Stillson V. MD

## 2014-09-04 NOTE — Progress Notes (Signed)
Patient Name: Jennifer [name] Date of Encounter: 09/04/2014  Active Problems:   Intramural aortic hematoma   Acute respiratory failure with hypoxia   Acute pulmonary edema   Cardiogenic shock   Length of Stay: 7  SUBJECTIVE  Continues to have chest pain and feels generally miserable, but no longer dyspneic. Still on relatively high supplemental O2. BP remains rather high. UO very low, but I'm not sure it was fully captured. Renal function parameters are unchanged.  CURRENT MEDS . antiseptic oral rinse  7 mL Mouth Rinse BID  . calcium carbonate  1 tablet Oral BID WC  . felodipine  2.5 mg Oral Daily  . furosemide  40 mg Intravenous Once  . furosemide  40 mg Intravenous Daily  . gemfibrozil  600 mg Oral BID AC  . insulin aspart  0-5 Units Subcutaneous QHS  . insulin aspart  0-9 Units Subcutaneous TID WC  . labetalol  200 mg Oral TID  . piperacillin-tazobactam (ZOSYN)  IV  3.375 g Intravenous 3 times per day  . polyvinyl alcohol  1 drop Both Eyes BID  . sodium chloride  10-40 mL Intracatheter Q12H  . vancomycin  1,000 mg Intravenous Q24H    OBJECTIVE   Intake/Output Summary (Last 24 hours) at 09/04/14 0808 Last data filed at 09/04/14 0500  Gross per 24 hour  Intake   1310 ml  Output    252 ml  Net   1058 ml   Filed Weights   09/02/14 0358 09/03/14 0300 09/04/14 0500  Weight: 71 kg (156 lb 8.4 oz) 68.1 kg (150 lb 2.1 oz) 68.1 kg (150 lb 2.1 oz)    PHYSICAL EXAM Filed Vitals:   09/04/14 0615 09/04/14 0630 09/04/14 0700 09/04/14 0730  BP:   162/66 162/66  Pulse: 71 71 72 70  Temp:    98.1 F (36.7 C)  TempSrc:    Oral  Resp: 15 19 16 17   Height:      Weight:      SpO2: 95% 96% 95% 95%   General: Alert, oriented x3, no distress Head: no evidence of trauma, PERRL, EOMI, no exophtalmos or lid lag, no myxedema, no xanthelasma; normal ears, nose and oropharynx Neck: normal jugular venous pulsations and no hepatojugular reflux; brisk carotid pulses without delay and  no carotid bruits Chest: clear to auscultation, no signs of consolidation by percussion or palpation, normal fremitus, symmetrical and full respiratory excursions Cardiovascular: normal position and quality of the apical impulse, regular rhythm, normal first and second heart sounds, no rubs or gallops, no murmur Abdomen: no tenderness or distention, no masses by palpation, no abnormal pulsatility or arterial bruits, normal bowel sounds, no hepatosplenomegaly Extremities: no clubbing, cyanosis or edema; 2+ radial, ulnar and brachial pulses bilaterally; 2+ right femoral, posterior tibial and dorsalis pedis pulses; 2+ left femoral, posterior tibial and dorsalis pedis pulses; no subclavian or femoral bruits Neurological: grossly nonfocal  LABS  CBC  Recent Labs  09/03/14 0400 09/04/14 0502  WBC 12.0* 10.8*  HGB 9.9* 10.0*  HCT 30.9* 31.1*  MCV 89.6 89.9  PLT 243 269   Basic Metabolic Panel  Recent Labs  09/02/14 0341 09/03/14 0400 09/04/14 0502  NA 139 141 141  K 4.2 3.6* 3.4*  CL 100 102 101  CO2 26 25 26   GLUCOSE 163* 146* 142*  BUN 26* 26* 26*  CREATININE 1.13* 1.08 1.11*  CALCIUM 9.2 8.8 8.9  MG 2.1  --   --   PHOS 4.0  --   --  Liver Function Tests No results found for this basename: AST, ALT, ALKPHOS, BILITOT, PROT, ALBUMIN,  in the last 72 hours No results found for this basename: LIPASE, AMYLASE,  in the last 72 hours Cardiac Enzymes  Recent Labs  09/01/14 1541 09/01/14 2141 09/02/14 0341  TROPONINI <0.30 <0.30 <0.30  Radiology Studies Imaging results have been reviewed and No results found.  TELE NSR  ASSESSMENT AND PLAN 1 thoracic aortic aneurysm with ACUTE intramural hematoma involving descending thoracic aorta- improved, but still occasional pain and CT unchanged yesterday. Dr Laneta SimmersBartle has reviewed; ascending aorta not involved; continue medical therapy for now with tight BP control; she will be a candidate for stent but plan to wait for one month as  long as pt stable. She continues to be symptomatic and I/m not sure we can wait that long. If chest pain persists even after BP is lower, may need to accelerate the timetable. 2 hypertension Increase labetalol and felodipine dose; goal SBP < 120. Not so bad that she needs nitroprusside, but this would be useful if she requires rapid BP reduction 3 diabetes mellitus-follow CBGs.  4 hyperlipidemia-Continue present meds.  5 acute kidney failure-resolved. Watch UO 6 Acute diastolic CHF-patient remains volume overloaded; I think it is possible she has lobar pulmonary edema due to LUPV stenosis (extrinsic compression by aneurysm is quite severe) and secondary pleural effusion. She still requires high flow O2 by nasal cannula and will have significant V/Q mismatch if that is the true situation. Diuretics will only help partially.  7 Multivessel coronary calcification on CT - will require evaluation for CAD (lexiscan Myoview when more stable? Coronary angio via right radial approach?)    Thurmon FairMihai Carless Slatten, MD, [facility] [phone] office [phone] pager 09/04/2014 8:08 AM

## 2014-09-05 DIAGNOSIS — I2584 Coronary atherosclerosis due to calcified coronary lesion: Secondary | ICD-10-CM

## 2014-09-05 DIAGNOSIS — I2510 Atherosclerotic heart disease of native coronary artery without angina pectoris: Secondary | ICD-10-CM

## 2014-09-05 LAB — BASIC METABOLIC PANEL
Anion gap: 11 (ref 5–15)
BUN: 26 mg/dL — ABNORMAL HIGH (ref 6–23)
CO2: 26 mEq/L (ref 19–32)
Calcium: 9.3 mg/dL (ref 8.4–10.5)
Chloride: 103 mEq/L (ref 96–112)
Creatinine, Ser: 1.03 mg/dL (ref 0.50–1.10)
GFR calc Af Amer: 55 mL/min — ABNORMAL LOW (ref >90)
GFR calc non Af Amer: 47 mL/min — ABNORMAL LOW (ref >90)
GLUCOSE: 190 mg/dL — AB (ref 70–99)
Potassium: 3.8 mEq/L (ref 3.7–5.3)
SODIUM: 140 meq/L (ref 137–147)

## 2014-09-05 LAB — GLUCOSE, CAPILLARY
GLUCOSE-CAPILLARY: 156 mg/dL — AB (ref 70–99)
Glucose-Capillary: 152 mg/dL — ABNORMAL HIGH (ref 70–99)
Glucose-Capillary: 136 mg/dL — ABNORMAL HIGH (ref 70–99)
Glucose-Capillary: 111 mg/dL — ABNORMAL HIGH (ref 70–99)

## 2014-09-05 MED ORDER — WITCH HAZEL-GLYCERIN EX PADS
MEDICATED_PAD | CUTANEOUS | Status: DC | PRN
  Administered 2014-09-05 (×3): 1 via TOPICAL
  Filled 2014-09-05: qty 100

## 2014-09-05 MED ORDER — LABETALOL HCL 200 MG PO TABS
400.0000 mg | ORAL_TABLET | Freq: Three times a day (TID) | ORAL | Status: DC
  Administered 2014-09-05 – 2014-09-12 (×20): 400 mg via ORAL
  Filled 2014-09-05 (×24): qty 2

## 2014-09-05 MED ORDER — FUROSEMIDE 10 MG/ML IJ SOLN
40.0000 mg | Freq: Two times a day (BID) | INTRAMUSCULAR | Status: DC
  Administered 2014-09-05 – 2014-09-06 (×3): 40 mg via INTRAVENOUS
  Filled 2014-09-05 (×5): qty 4

## 2014-09-05 MED ORDER — FELODIPINE ER 10 MG PO TB24
10.0000 mg | ORAL_TABLET | Freq: Every day | ORAL | Status: DC
  Administered 2014-09-05 – 2014-09-12 (×8): 10 mg via ORAL
  Filled 2014-09-05 (×8): qty 1

## 2014-09-05 MED ORDER — POTASSIUM CHLORIDE ER 10 MEQ PO TBCR
40.0000 meq | EXTENDED_RELEASE_TABLET | Freq: Two times a day (BID) | ORAL | Status: DC
  Administered 2014-09-05 (×2): 40 meq via ORAL
  Filled 2014-09-05 (×4): qty 4

## 2014-09-05 MED ORDER — POTASSIUM CHLORIDE CRYS ER 20 MEQ PO TBCR
20.0000 meq | EXTENDED_RELEASE_TABLET | Freq: Once | ORAL | Status: AC
  Administered 2014-09-05: 20 meq via ORAL

## 2014-09-05 MED ORDER — SPIRONOLACTONE 12.5 MG HALF TABLET
12.5000 mg | ORAL_TABLET | Freq: Every day | ORAL | Status: DC
  Administered 2014-09-05: 12.5 mg via ORAL
  Filled 2014-09-05 (×2): qty 1

## 2014-09-05 NOTE — Progress Notes (Addendum)
Patient ID: Jennifer MusselJANIE B [name], female   DOB: 01/24/1925, 80 y.o.   MRN: [id number]   SUBJECTIVE: Still having on and off chest pain, no back pain.  Not as much as last week.  Wants get out of bed.  BP still running high in 150s-160s range systolic. Still with oxygen requirement.   Scheduled Meds: . antiseptic oral rinse  7 mL Mouth Rinse BID  . calcium carbonate  1 tablet Oral BID WC  . docusate  100 mg Oral Daily  . felodipine  10 mg Oral Daily  . furosemide  40 mg Intravenous BID  . gemfibrozil  600 mg Oral BID AC  . insulin aspart  0-5 Units Subcutaneous QHS  . insulin aspart  0-9 Units Subcutaneous TID WC  . labetalol  400 mg Oral TID  . polyvinyl alcohol  1 drop Both Eyes BID  . potassium chloride  20 mEq Oral TID  . sodium chloride  10-40 mL Intracatheter Q12H  . spironolactone  12.5 mg Oral Daily   Continuous Infusions: . sodium chloride Stopped (09/04/14 2200)   PRN Meds:.acetaminophen, ALPRAZolam, bisacodyl, hydrALAZINE, HYDROcodone-acetaminophen, labetalol, morphine injection, ondansetron (ZOFRAN) IV, sodium chloride    Filed Vitals:   09/05/14 0500 09/05/14 0600 09/05/14 0700 09/05/14 0730  BP: 163/73 165/75 163/66   Pulse: 70 72 71   Temp:    97.8 F (36.6 C)  TempSrc:    Oral  Resp: 19 24 23    Height:      Weight: 147 lb 0.8 oz (66.7 kg)     SpO2: 93% 94% 91%     Intake/Output Summary (Last 24 hours) at 09/05/14 0757 Last data filed at 09/04/14 1900  Gross per 24 hour  Intake 1059.67 ml  Output      6 ml  Net 1053.67 ml    LABS: Basic Metabolic Panel:  Recent Labs  40/98/1109/02/13 0400 09/04/14 0502  NA 141 141  K 3.6* 3.4*  CL 102 101  CO2 25 26  GLUCOSE 146* 142*  BUN 26* 26*  CREATININE 1.08 1.11*  CALCIUM 8.8 8.9   Liver Function Tests: No results found for this basename: AST, ALT, ALKPHOS, BILITOT, PROT, ALBUMIN,  in the last 72 hours No results found for this basename: LIPASE, AMYLASE,  in the last 72 hours CBC:  Recent Labs  09/03/14 0400  09/04/14 0502  WBC 12.0* 10.8*  HGB 9.9* 10.0*  HCT 30.9* 31.1*  MCV 89.6 89.9  PLT 243 269   Cardiac Enzymes: No results found for this basename: CKTOTAL, CKMB, CKMBINDEX, TROPONINI,  in the last 72 hours BNP: No components found with this basename: POCBNP,  D-Dimer: No results found for this basename: DDIMER,  in the last 72 hours Hemoglobin A1C: No results found for this basename: HGBA1C,  in the last 72 hours Fasting Lipid Panel: No results found for this basename: CHOL, HDL, LDLCALC, TRIG, CHOLHDL, LDLDIRECT,  in the last 72 hours Thyroid Function Tests: No results found for this basename: TSH, T4TOTAL, FREET3, T3FREE, THYROIDAB,  in the last 72 hours Anemia Panel: No results found for this basename: VITAMINB12, FOLATE, FERRITIN, TIBC, IRON, RETICCTPCT,  in the last 72 hours  RADIOLOGY: Ct Angio Chest W/cm &/or Wo Cm  08/28/2014   CLINICAL DATA:  Chest pain, back pain, history of abdominal aortic aneurysm, diabetes, hypertension  EXAM: CT ANGIOGRAPHY CHEST, ABDOMEN AND PELVIS  TECHNIQUE: Multidetector CT imaging through the chest, abdomen and pelvis was performed using the standard protocol during bolus administration of  intravenous contrast. Multiplanar reconstructed images and MIPs were obtained and reviewed to evaluate the vascular anatomy.  CONTRAST:  80mL OMNIPAQUE IOHEXOL 350 MG/ML SOLN  COMPARISON:  06/22/2010  FINDINGS: CTA CHEST FINDINGS  Proximal descending thoracic aorta demonstrates wall thickening with hyperattenuation on the noncontrast phase beginning just distal to the left subclavian artery and extending throughout the descending thoracic aorta diffusely. This appearance is consistent with descending thoracic aortic acute intramural hemorrhage. Postcontrast, there are 2 areas of wall enhancement within the thickened hyperdense wall, image 32 and 39 of series 5 suspicious for ongoing intramural hemorrhage versus areas of small ulcerated plaque formation. Also, there is a  fusiform atherosclerotic descending thoracic aortic aneurysm measuring 5.1 x 5.2 cm, image 66 series 5. The transverse aorta and ascending aortic segments do not appear to be involved. Major branch vessels remain patent. No mediastinal hemorrhage or pericardial effusion. No pleural effusion. Negative for adenopathy. Mild enlargement of the inferior thyroid with scattered hypodense nodules.  Lung windows demonstrate respiratory motion artifact. Bibasilar minor atelectasis noted. Right middle lobe well-circumscribed 8 mm nodule again noted, stable compared to 06/22/2010 supporting a benign nodule. No focal pneumonia, collapse or consolidation. Negative for edema or pneumothorax. Trachea and central airways are patent. Coronary calcifications evident.  Review of the MIP images confirms the above findings.  CTA ABDOMEN AND PELVIS FINDINGS  Mild ectasia of the abdominal aorta diffusely with calcific atherosclerosis. Patient appears status post infrarenal aortic aneurysm repair. No significant recurrent abdominal aneurysm, hemorrhage, retroperitoneal abnormality. 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 This can be seen with median arcuate ligament syndrome. SMA and main renal arteries are patent. 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  Tortuous atherosclerotic iliac vessels. Common, internal and external iliac arteries remain patent. No iliac aneurysm. Visualized common femoral, proximal profunda femoral and superficial femoral arteries are also patent.  Nonvascular: Stable small 12 mm hypodense cyst in the right hepatic dome. No biliary dilatation. Gallbladder, biliary system, pancreas, spleen, and adrenal glands are within normal limits for age and arterial phase imaging.  Kidneys demonstrate cortical thinning/ scarring but normal enhancement and excretion. Scattered small renal cysts noted as before. No  significant interval change.  Negative for bowel obstruction, dilatation, ileus, or free air. Diffuse colonic diverticulosis noted.  No abdominal free fluid, fluid collection, hemorrhage, abscess, or adenopathy.  Within the pelvis, there is a chronic sigmoid diverticulosis. Small incidental 2 cm left ovarian cyst, image 243 series 5. Urinary bladder unremarkable. Rectum is stool-filled. No pelvic free fluid, fluid collection, hemorrhage, abscess, adenopathy, inguinal abnormality, or hernia. Prior hysterectomy noted.  Bones are osteopenic. Diffuse degenerative changes of the spine and associated scoliosis.  Review of the MIP images confirms the above findings.  IMPRESSION: Acute intramural hemorrhage of the descending thoracic aorta with an associated mid descending thoracic aortic 5 cm aneurysm.  Abdominal aortic atherosclerotic changes, status post AAA repair.  Stable 8 mm right middle lobe well-circumscribed nodule compared to 2011 compatible with a benign nodule.  Stable hepatic and renal cysts  Bibasilar atelectasis  Colonic diverticulosis  No acute intra-abdominal or pelvic process  These results were called by telephone at the time of interpretation on 08/28/2014 at 2:23 pm to Dr. Jaci Carrel , who verbally acknowledged these results.   Electronically Signed   By: Ruel Favors M.D.   On: 08/28/2014 14:28   Dg Esophagus  08/10/2014   CLINICAL DATA:  Dysphagia  EXAM:  ESOPHOGRAM/BARIUM SWALLOW  TECHNIQUE: Single contrast examination was performed using  thin barium.  FLUOROSCOPY TIME:  2 min 0 seconds  COMPARISON:  None.  FINDINGS: A single contrast barium swallow was performed. Rapid sequence spot films of the cervical esophagus show no significant abnormality. Very minimal indentation upon the right lateral aspect of the lower cervical esophagus could be due to prominent thyroid tissue. Moderate tertiary contractions are present in the mid and distal esophagus. No hiatal hernia is seen and no reflux  is demonstrated. A barium pill was given at the end of the study, but the patient could not swallow the barium tablet.  IMPRESSION: 1. Moderate tertiary contractions in the mid and distal esophagus. 2. Slight indentation upon the lower cervical esophagus from the right of midline could be due to prominent thyroid tissue. Correlate clinically. 3. No hiatal hernia or reflux is demonstrated. The barium pill could not be swallowed by the patient.   Electronically Signed   By: Dwyane Dee M.D.   On: 08/10/2014 12:14   Dg Chest Port 1 View  09/02/2014   CLINICAL DATA:  Acute hypoxic respiratory failure, intramural aortic hematoma  EXAM: PORTABLE CHEST - 1 VIEW  COMPARISON:  09/01/2014  FINDINGS: Cardiac shadow remains mildly enlarged. A left jugular central line is again seen projecting over the left innominate vein. Right lung demonstrates improved aeration with only minimal residual atelectasis. The large left-sided pleural effusion has decreased in the interval from the prior exam. Persistent left lower lobe consolidation is noted.  IMPRESSION: Improvement in left pleural effusion. Persistent left lower lobe consolidation is noted.  Improved aeration in the right lung base.   Electronically Signed   By: Alcide Clever M.D.   On: 09/02/2014 08:09   Dg Chest Port 1 View  09/01/2014   CLINICAL DATA:  New central line placement.  Hypoxia.  EXAM: PORTABLE CHEST - 1 VIEW  COMPARISON:  Multiple exams, including 09/01/2014 CT scan  FINDINGS: The left IJ catheter projects over the aorta but based on its curvature high finding more likely to be in the brachiocephalic vein. A lateral projection or CT chest could confirm this. No pneumothorax.  Large left pleural effusion with passive atelectasis in the left hemithorax. The patient has a known thoracic aortic aneurysm. Bandlike opacity at the right lung base.  IMPRESSION: 1. Left IJ line tip is strongly prefer to be in the brachiocephalic vein. 2. Large left pleural effusion  with considerable passive atelectasis. Bandlike atelectasis at the right lung base. No pneumothorax observed.   Electronically Signed   By: Herbie Baltimore M.D.   On: 09/01/2014 17:01   Dg Chest Port 1 View  08/28/2014   CLINICAL DATA:  Chest pain  EXAM: PORTABLE CHEST - 1 VIEW  COMPARISON:  07/31/2010  FINDINGS: Mild cardiomegaly with central vascular congestion. Tortuous atherosclerotic descending thoracic aorta. Chronic medial left lower lobe atelectasis along the aortic contour. No definite focal pneumonia, collapse or consolidation. No edema, effusion or pneumothorax. Trachea midline.  IMPRESSION: Cardiomegaly with vascular congestion  Chronic left lower lobe atelectasis  Stable exam   Electronically Signed   By: Ruel Favors M.D.   On: 08/28/2014 09:53   Ct Angio Chest Aortic Dissect W &/or W/o  09/01/2014   CLINICAL DATA:  Intramural hematoma, thoracic aortic aneurysm  EXAM: CT ANGIOGRAPHY CHEST WITH CONTRAST  TECHNIQUE: Multidetector CT imaging of the chest was performed using the standard protocol during bolus administration of intravenous contrast. Multiplanar CT image reconstructions and MIPs were  obtained to evaluate the vascular anatomy.  CONTRAST:  80mL OMNIPAQUE IOHEXOL 350 MG/ML SOLN  COMPARISON:  08/28/2014  FINDINGS: Contrast reflux from the right atrium into hepatic veins. There good contrast opacification of pulmonary artery branches with no filling defects to suggest acute PE. There is compression of the superior left pulmonary vein by at the descending thoracic aortic aneurysm. Mild plaque in the ascending aorta. Classic 3 vessel brachiocephalic arterial origin anatomy without proximal stenosis.  Hyperdense intramural hematoma in the distal arch beyond the subclavian origin extending into the distal descending thoracic segment. There is scattered plaque and displaced intimal calcifications. Fusiform aneurysmal dilatation of the mid descending thoracic aorta, 5.1 x 5.2 cm maximum  transverse dimensions, stable since previous study, tapering to a diameter of 3 cm, tortuous in its distal descending segment above the diaphragm. The intramural hematoma does not extend below the diaphragm. The visualized proximal abdominal aorta shows moderate atheromatous change without dissection, aneurysm, or stenosis. Origin stenosis of the celiac axis with distal aneurysmal dilatation to 17 mm.  New bilateral pleural effusions left greater than right. No pericardial effusion. Mild four-chamber cardiac enlargement. No hilar or mediastinal adenopathy identified. There is consolidation/ atelectasis in the posterior left lower lobe. Dependent atelectasis in the right lower lobe and left upper lobe. 11mm nodule, right middle lobe image 65/6, previously 8 mm on 06/22/2010.  Review of the MIP images confirms the above findings.  IMPRESSION: 1. Stable appearance of intramural hemorrhage in the descending thoracic aorta and 5.2 cm mid descending fusiform aneurysm. This results in compression of the adjacent superior left pulmonary vein. 2. New pleural effusions left greater than right, with consolidation/atelectasis in the posterior left lower lobe 3. 11 mm right lower lobe pulmonary nodule, previously 8 mm on 06/22/2010. Follow-up recommended to exclude low-grade neoplasm.   Electronically Signed   By: Oley Balm M.D.   On: 09/01/2014 13:26   Ct Angio Abd/pel W/ And/or W/o  08/28/2014   CLINICAL DATA:  Chest pain, back pain, history of abdominal aortic aneurysm, diabetes, hypertension  EXAM: CT ANGIOGRAPHY CHEST, ABDOMEN AND PELVIS  TECHNIQUE: Multidetector CT imaging through the chest, abdomen and pelvis was performed using the standard protocol during bolus administration of intravenous contrast. Multiplanar reconstructed images and MIPs were obtained and reviewed to evaluate the vascular anatomy.  CONTRAST:  80mL OMNIPAQUE IOHEXOL 350 MG/ML SOLN  COMPARISON:  06/22/2010  FINDINGS: CTA CHEST FINDINGS   Proximal descending thoracic aorta demonstrates wall thickening with hyperattenuation on the noncontrast phase beginning just distal to the left subclavian artery and extending throughout the descending thoracic aorta diffusely. This appearance is consistent with descending thoracic aortic acute intramural hemorrhage. Postcontrast, there are 2 areas of wall enhancement within the thickened hyperdense wall, image 32 and 39 of series 5 suspicious for ongoing intramural hemorrhage versus areas of small ulcerated plaque formation. Also, there is a fusiform atherosclerotic descending thoracic aortic aneurysm measuring 5.1 x 5.2 cm, image 66 series 5. The transverse aorta and ascending aortic segments do not appear to be involved. Major branch vessels remain patent. No mediastinal hemorrhage or pericardial effusion. No pleural effusion. Negative for adenopathy. Mild enlargement of the inferior thyroid with scattered hypodense nodules.  Lung windows demonstrate respiratory motion artifact. Bibasilar minor atelectasis noted. Right middle lobe well-circumscribed 8 mm nodule again noted, stable compared to 06/22/2010 supporting a benign nodule. No focal pneumonia, collapse or consolidation. Negative for edema or pneumothorax. Trachea and central airways are patent. Coronary calcifications evident.  Review of the MIP  images confirms the above findings.  CTA ABDOMEN AND PELVIS FINDINGS  Mild ectasia of the abdominal aorta diffusely with calcific atherosclerosis. Patient appears status post infrarenal aortic aneurysm repair. No significant recurrent abdominal aneurysm, hemorrhage, retroperitoneal abnormality. 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 This can be seen with median arcuate ligament syndrome. SMA and main renal arteries are patent. 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  Tortuous atherosclerotic iliac vessels.  Common, internal and external iliac arteries remain patent. No iliac aneurysm. Visualized common femoral, proximal profunda femoral and superficial femoral arteries are also patent.  Nonvascular: Stable small 12 mm hypodense cyst in the right hepatic dome. No biliary dilatation. Gallbladder, biliary system, pancreas, spleen, and adrenal glands are within normal limits for age and arterial phase imaging.  Kidneys demonstrate cortical thinning/ scarring but normal enhancement and excretion. Scattered small renal cysts noted as before. No significant interval change.  Negative for bowel obstruction, dilatation, ileus, or free air. Diffuse colonic diverticulosis noted.  No abdominal free fluid, fluid collection, hemorrhage, abscess, or adenopathy.  Within the pelvis, there is a chronic sigmoid diverticulosis. Small incidental 2 cm left ovarian cyst, image 243 series 5. Urinary bladder unremarkable. Rectum is stool-filled. No pelvic free fluid, fluid collection, hemorrhage, abscess, adenopathy, inguinal abnormality, or hernia. Prior hysterectomy noted.  Bones are osteopenic. Diffuse degenerative changes of the spine and associated scoliosis.  Review of the MIP images confirms the above findings.  IMPRESSION: Acute intramural hemorrhage of the descending thoracic aorta with an associated mid descending thoracic aortic 5 cm aneurysm.  Abdominal aortic atherosclerotic changes, status post AAA repair.  Stable 8 mm right middle lobe well-circumscribed nodule compared to 2011 compatible with a benign nodule.  Stable hepatic and renal cysts  Bibasilar atelectasis  Colonic diverticulosis  No acute intra-abdominal or pelvic process  These results were called by telephone at the time of interpretation on 08/28/2014 at 2:23 pm to Dr. Jaci Carrel , who verbally acknowledged these results.   Electronically Signed   By: Ruel Favors M.D.   On: 08/28/2014 14:28    PHYSICAL EXAM General: NAD Neck: JVP 8-9 cm, no thyromegaly  or thyroid nodule.  Lungs: Decreased breath sounds at bases bilaterally. CV: Nondisplaced PMI.  Heart regular S1/S2, no S3/S4, no murmur.  No peripheral edema.  No carotid bruit.  Normal pedal pulses.  Abdomen: Soft, nontender, no hepatosplenomegaly, no distention.  Neurologic: Alert and oriented x 3.  Psych: Normal affect. Extremities: No clubbing or cyanosis.   TELEMETRY: Reviewed telemetry pt in [facility]  ASSESSMENT AND PLAN: 80 yo with history of AAA repair presented with acute intramural hematoma descending aorta beyond left subclavian.  1. Aortic intramural hematoma: Descending thoracic aorta beyond the left subclavian.  Followed by Dr Laneta Simmers, recommends stent graft repair about 1 month after event as this will significantly decrease risk of complications with the procedure.  She has had ongoing mild on and off chest pain, no back pain.  Not clear that this pain comes from the aorta.  She needs better BP control.  - Increase felodipine to 10 mg daily and labetalol to 400 mg tid.  Will need to watch for BP overshoot as we had last week.  - Add spironolactone 12.5 mg daily with hypokalemia.  2. Acute diastolic CHF: EF 53-66% on last echo.  Some volume overload on exam with oxygen requirement.  Also has pleural effusions that contribute.  Will gently diurese,  Lasix 40 mg IV bid.  3. H/o AKI: Needs BMET today, creatinine ok yesterday.  4. CAD: Significant coronary calcification on her chest CTs, continues to have on and off chest pain.  I suspect that she will need coronary angiography if plan continues to be aggressive treatment.  Will control BP and CHF first, plan right radial cath later in week.  5. I think she is stable enough to mobilize, out of bed to chair and start PT.   Marca Ancona 09/05/2014

## 2014-09-05 NOTE — Evaluation (Signed)
Physical Therapy Evaluation Patient Details Name: Jennifer MusselJANIE B Escudero MRN: [id number] DOB: 11/07/1925 Today's Date: 09/05/2014   History of Present Illness  Pt is an 80 yo female who presented to ED 9/27 with chest pain. She was admitted with suspicion for aortic aneurysm vs PE. A CT of the chest showed acute intramural hemorrhage of the descending thoracic aorta.  Clinical Impression  Pt admitted with the above. Pt currently with functional limitations due to the deficits listed below (see PT Problem List). At the time of PT eval pt with bowel and bladder incontinence which limited transfers. Need to clarify some details about home situation prior to d/c, however pt states she was independent PTA. Pt will benefit from skilled PT to increase their independence and safety with mobility to allow discharge to the venue listed below.      Follow Up Recommendations Home health PT;Supervision/Assistance - 24 hour    Equipment Recommendations  Rolling walker with 5" wheels;3in1 (PT)    Recommendations for Other Services       Precautions / Restrictions Precautions Precautions: Fall Restrictions Weight Bearing Restrictions: No      Mobility  Bed Mobility Overal bed mobility: Needs Assistance Bed Mobility: Rolling Rolling: Min assist         General bed mobility comments: VC's for technique and use of bed rails for assist. Pt initiated supine>sit and had bowel and bladder incontinence for the second time during the session, and further mobility was deferred.   Transfers                    Ambulation/Gait                Stairs            Wheelchair Mobility    Modified Rankin (Stroke Patients Only)       Balance                                             Pertinent Vitals/Pain Pain Assessment: No/denies pain    Home Living Family/patient expects to be discharged to:: Private residence Living Arrangements: Spouse/significant  other Available Help at Discharge: Family;Available 24 hours/day Type of Home: House         Home Equipment: None      Prior Function Level of Independence: Independent               Hand Dominance   Dominant Hand: Right    Extremity/Trunk Assessment   Upper Extremity Assessment: Defer to OT evaluation           Lower Extremity Assessment: Generalized weakness         Communication   Communication: No difficulties  Cognition Arousal/Alertness: Awake/alert Behavior During Therapy: WFL for tasks assessed/performed Overall Cognitive Status: Within Functional Limits for tasks assessed                      General Comments      Exercises        Assessment/Plan    PT Assessment Patient needs continued PT services  PT Diagnosis Difficulty walking;Generalized weakness   PT Problem List Decreased strength;Decreased range of motion;Decreased activity tolerance;Decreased balance;Decreased mobility;Decreased knowledge of use of DME;Decreased safety awareness;Decreased knowledge of precautions  PT Treatment Interventions DME instruction;Gait training;Stair training;Functional mobility training;Therapeutic activities;Therapeutic exercise;Neuromuscular re-education;Patient/family education   PT Goals (  Current goals can be found in the Care Plan section) Acute Rehab PT Goals Patient Stated Goal: To return home with her husband independently PT Goal Formulation: With patient Time For Goal Achievement: 09/12/14 Potential to Achieve Goals: Good    Frequency Min 3X/week   Barriers to discharge        Co-evaluation               End of Session Equipment Utilized During Treatment: Oxygen Activity Tolerance: Treatment limited secondary to medical complications (Comment) (Frequent bowel and bladder incontinence) Patient left: in bed;with call bell/phone within reach Nurse Communication: Mobility status         Time: 1211-1238 PT Time  Calculation (min): 27 min   Charges:   PT Evaluation $Initial PT Evaluation Tier I: 1 Procedure PT Treatments $Therapeutic Activity: 23-37 mins   PT G Codes:          Conni Slipper 09/05/2014, 1:01 PM  Conni Slipper, PT, DPT Acute Rehabilitation Services Pager: [phone]

## 2014-09-06 ENCOUNTER — Inpatient Hospital Stay (HOSPITAL_COMMUNITY): Payer: Medicare Other

## 2014-09-06 DIAGNOSIS — N179 Acute kidney failure, unspecified: Secondary | ICD-10-CM

## 2014-09-06 LAB — GLUCOSE, CAPILLARY
GLUCOSE-CAPILLARY: 209 mg/dL — AB (ref 70–99)
Glucose-Capillary: 157 mg/dL — ABNORMAL HIGH (ref 70–99)
Glucose-Capillary: 122 mg/dL — ABNORMAL HIGH (ref 70–99)
Glucose-Capillary: 136 mg/dL — ABNORMAL HIGH (ref 70–99)

## 2014-09-06 LAB — CBC
HEMATOCRIT: 31.6 % — AB (ref 36.0–46.0)
HEMOGLOBIN: 10 g/dL — AB (ref 12.0–15.0)
MCH: 28.6 pg (ref 26.0–34.0)
MCHC: 31.6 g/dL (ref 30.0–36.0)
MCV: 90.3 fL (ref 78.0–100.0)
Platelets: 298 10*3/uL (ref 150–400)
RBC: 3.5 MIL/uL — ABNORMAL LOW (ref 3.87–5.11)
RDW: 15.1 % (ref 11.5–15.5)
WBC: 9.5 10*3/uL (ref 4.0–10.5)

## 2014-09-06 LAB — BASIC METABOLIC PANEL
Anion gap: 15 (ref 5–15)
BUN: 32 mg/dL — ABNORMAL HIGH (ref 6–23)
CO2: 25 meq/L (ref 19–32)
CREATININE: 1.25 mg/dL — AB (ref 0.50–1.10)
Calcium: 9.8 mg/dL (ref 8.4–10.5)
Chloride: 101 mEq/L (ref 96–112)
GFR calc non Af Amer: 38 mL/min — ABNORMAL LOW (ref >90)
GFR, EST AFRICAN AMERICAN: 44 mL/min — AB (ref >90)
Glucose, Bld: 145 mg/dL — ABNORMAL HIGH (ref 70–99)
Potassium: 4.3 mEq/L (ref 3.7–5.3)
Sodium: 141 mEq/L (ref 137–147)

## 2014-09-06 MED ORDER — ATORVASTATIN CALCIUM 20 MG PO TABS
20.0000 mg | ORAL_TABLET | Freq: Every day | ORAL | Status: DC
  Administered 2014-09-06 – 2014-09-11 (×6): 20 mg via ORAL
  Filled 2014-09-06 (×7): qty 1

## 2014-09-06 MED ORDER — SPIRONOLACTONE 25 MG PO TABS
25.0000 mg | ORAL_TABLET | Freq: Every day | ORAL | Status: DC
  Administered 2014-09-06 – 2014-09-12 (×7): 25 mg via ORAL
  Filled 2014-09-06 (×7): qty 1

## 2014-09-06 NOTE — Progress Notes (Addendum)
Patient ID: Jennifer [name], female   DOB: [birth year], 80 y.o.   MRN: [id number]   SUBJECTIVE: She has the same on and off chest pain, milder than it has been in the past.  BP better but still high at times.  Still has oxygen requirement.  CVP 2-3 this morning, got IV Lasix yesterday.  Creatinine up to 1.25.  Very weak when walked with PT yesterday.   Scheduled Meds: . antiseptic oral rinse  7 mL Mouth Rinse BID  . atorvastatin  20 mg Oral q1800  . calcium carbonate  1 tablet Oral BID WC  . docusate  100 mg Oral Daily  . felodipine  10 mg Oral Daily  . insulin aspart  0-5 Units Subcutaneous QHS  . insulin aspart  0-9 Units Subcutaneous TID WC  . labetalol  400 mg Oral TID  . polyvinyl alcohol  1 drop Both Eyes BID  . sodium chloride  10-40 mL Intracatheter Q12H  . spironolactone  12.5 mg Oral Daily   Continuous Infusions: . sodium chloride Stopped (09/04/14 2200)   PRN Meds:.acetaminophen, ALPRAZolam, bisacodyl, hydrALAZINE, HYDROcodone-acetaminophen, labetalol, morphine injection, ondansetron (ZOFRAN) IV, sodium chloride, witch hazel-glycerin    Filed Vitals:   09/06/14 0600 09/06/14 0636 09/06/14 0700 09/06/14 0730  BP: 169/65 172/73 130/107 119/52  Pulse: 65 68 71   Temp:    98 F (36.7 C)  TempSrc:    Oral  Resp: 18 15 17    Height:      Weight:      SpO2: 92% 94% 94% 95%    Intake/Output Summary (Last 24 hours) at 09/06/14 0825 Last data filed at 09/06/14 0800  Gross per 24 hour  Intake     40 ml  Output    475 ml  Net   -435 ml    LABS: Basic Metabolic Panel:  Recent Labs  40/98/11 0830 09/06/14 0400  NA 140 141  K 3.8 4.3  CL 103 101  CO2 26 25  GLUCOSE 190* 145*  BUN 26* 32*  CREATININE 1.03 1.25*  CALCIUM 9.3 9.8   Liver Function Tests: No results found for this basename: AST, ALT, ALKPHOS, BILITOT, PROT, ALBUMIN,  in the last 72 hours No results found for this basename: LIPASE, AMYLASE,  in the last 72 hours CBC:  Recent Labs  09/04/14 0502  09/06/14 0400  WBC 10.8* 9.5  HGB 10.0* 10.0*  HCT 31.1* 31.6*  MCV 89.9 90.3  PLT 269 298   Cardiac Enzymes: No results found for this basename: CKTOTAL, CKMB, CKMBINDEX, TROPONINI,  in the last 72 hours BNP: No components found with this basename: POCBNP,  D-Dimer: No results found for this basename: DDIMER,  in the last 72 hours Hemoglobin A1C: No results found for this basename: HGBA1C,  in the last 72 hours Fasting Lipid Panel: No results found for this basename: CHOL, HDL, LDLCALC, TRIG, CHOLHDL, LDLDIRECT,  in the last 72 hours Thyroid Function Tests: No results found for this basename: TSH, T4TOTAL, FREET3, T3FREE, THYROIDAB,  in the last 72 hours Anemia Panel: No results found for this basename: VITAMINB12, FOLATE, FERRITIN, TIBC, IRON, RETICCTPCT,  in the last 72 hours  RADIOLOGY: Ct Angio Chest W/cm &/or Wo Cm  08/28/2014   CLINICAL DATA:  Chest pain, back pain, history of abdominal aortic aneurysm, diabetes, hypertension  EXAM: CT ANGIOGRAPHY CHEST, ABDOMEN AND PELVIS  TECHNIQUE: Multidetector CT imaging through the chest, abdomen and pelvis was performed using the standard protocol during bolus administration of intravenous  contrast. Multiplanar reconstructed images and MIPs were obtained and reviewed to evaluate the vascular anatomy.  CONTRAST:  80mL OMNIPAQUE IOHEXOL 350 MG/ML SOLN  COMPARISON:  06/22/2010  FINDINGS: CTA CHEST FINDINGS  Proximal descending thoracic aorta demonstrates wall thickening with hyperattenuation on the noncontrast phase beginning just distal to the left subclavian artery and extending throughout the descending thoracic aorta diffusely. This appearance is consistent with descending thoracic aortic acute intramural hemorrhage. Postcontrast, there are 2 areas of wall enhancement within the thickened hyperdense wall, image 32 and 39 of series 5 suspicious for ongoing intramural hemorrhage versus areas of small ulcerated plaque formation. Also, there is a  fusiform atherosclerotic descending thoracic aortic aneurysm measuring 5.1 x 5.2 cm, image 66 series 5. The transverse aorta and ascending aortic segments do not appear to be involved. Major branch vessels remain patent. No mediastinal hemorrhage or pericardial effusion. No pleural effusion. Negative for adenopathy. Mild enlargement of the inferior thyroid with scattered hypodense nodules.  Lung windows demonstrate respiratory motion artifact. Bibasilar minor atelectasis noted. Right middle lobe well-circumscribed 8 mm nodule again noted, stable compared to 06/22/2010 supporting a benign nodule. No focal pneumonia, collapse or consolidation. Negative for edema or pneumothorax. Trachea and central airways are patent. Coronary calcifications evident.  Review of the MIP images confirms the above findings.  CTA ABDOMEN AND PELVIS FINDINGS  Mild ectasia of the abdominal aorta diffusely with calcific atherosclerosis. Patient appears status post infrarenal aortic aneurysm repair. No significant recurrent abdominal aneurysm, hemorrhage, retroperitoneal abnormality. 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 This can be seen with median arcuate ligament syndrome. SMA and main renal arteries are patent. 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  Tortuous atherosclerotic iliac vessels. Common, internal and external iliac arteries remain patent. No iliac aneurysm. Visualized common femoral, proximal profunda femoral and superficial femoral arteries are also patent.  Nonvascular: Stable small 12 mm hypodense cyst in the right hepatic dome. No biliary dilatation. Gallbladder, biliary system, pancreas, spleen, and adrenal glands are within normal limits for age and arterial phase imaging.  Kidneys demonstrate cortical thinning/ scarring but normal enhancement and excretion. Scattered small renal cysts noted as before. No  significant interval change.  Negative for bowel obstruction, dilatation, ileus, or free air. Diffuse colonic diverticulosis noted.  No abdominal free fluid, fluid collection, hemorrhage, abscess, or adenopathy.  Within the pelvis, there is a chronic sigmoid diverticulosis. Small incidental 2 cm left ovarian cyst, image 243 series 5. Urinary bladder unremarkable. Rectum is stool-filled. No pelvic free fluid, fluid collection, hemorrhage, abscess, adenopathy, inguinal abnormality, or hernia. Prior hysterectomy noted.  Bones are osteopenic. Diffuse degenerative changes of the spine and associated scoliosis.  Review of the MIP images confirms the above findings.  IMPRESSION: Acute intramural hemorrhage of the descending thoracic aorta with an associated mid descending thoracic aortic 5 cm aneurysm.  Abdominal aortic atherosclerotic changes, status post AAA repair.  Stable 8 mm right middle lobe well-circumscribed nodule compared to 2011 compatible with a benign nodule.  Stable hepatic and renal cysts  Bibasilar atelectasis  Colonic diverticulosis  No acute intra-abdominal or pelvic process  These results were called by telephone at the time of interpretation on 08/28/2014 at 2:23 pm to Dr. Jaci CarrelHRISTOPHER POLLINA , who verbally acknowledged these results.   Electronically Signed   By: Ruel Favorsrevor  Shick M.D.   On: 08/28/2014 14:28   Dg Esophagus  08/10/2014   CLINICAL DATA:  Dysphagia  EXAM: ESOPHOGRAM/BARIUM  SWALLOW  TECHNIQUE: Single contrast examination was performed using  thin barium.  FLUOROSCOPY TIME:  2 min 0 seconds  COMPARISON:  None.  FINDINGS: A single contrast barium swallow was performed. Rapid sequence spot films of the cervical esophagus show no significant abnormality. Very minimal indentation upon the right lateral aspect of the lower cervical esophagus could be due to prominent thyroid tissue. Moderate tertiary contractions are present in the mid and distal esophagus. No hiatal hernia is seen and no reflux  is demonstrated. A barium pill was given at the end of the study, but the patient could not swallow the barium tablet.  IMPRESSION: 1. Moderate tertiary contractions in the mid and distal esophagus. 2. Slight indentation upon the lower cervical esophagus from the right of midline could be due to prominent thyroid tissue. Correlate clinically. 3. No hiatal hernia or reflux is demonstrated. The barium pill could not be swallowed by the patient.   Electronically Signed   By: Dwyane Dee M.D.   On: 08/10/2014 12:14   Dg Chest Port 1 View  09/02/2014   CLINICAL DATA:  Acute hypoxic respiratory failure, intramural aortic hematoma  EXAM: PORTABLE CHEST - 1 VIEW  COMPARISON:  09/01/2014  FINDINGS: Cardiac shadow remains mildly enlarged. A left jugular central line is again seen projecting over the left innominate vein. Right lung demonstrates improved aeration with only minimal residual atelectasis. The large left-sided pleural effusion has decreased in the interval from the prior exam. Persistent left lower lobe consolidation is noted.  IMPRESSION: Improvement in left pleural effusion. Persistent left lower lobe consolidation is noted.  Improved aeration in the right lung base.   Electronically Signed   By: Alcide Clever M.D.   On: 09/02/2014 08:09   Dg Chest Port 1 View  09/01/2014   CLINICAL DATA:  New central line placement.  Hypoxia.  EXAM: PORTABLE CHEST - 1 VIEW  COMPARISON:  Multiple exams, including 09/01/2014 CT scan  FINDINGS: The left IJ catheter projects over the aorta but based on its curvature high finding more likely to be in the brachiocephalic vein. A lateral projection or CT chest could confirm this. No pneumothorax.  Large left pleural effusion with passive atelectasis in the left hemithorax. The patient has a known thoracic aortic aneurysm. Bandlike opacity at the right lung base.  IMPRESSION: 1. Left IJ line tip is strongly prefer to be in the brachiocephalic vein. 2. Large left pleural effusion  with considerable passive atelectasis. Bandlike atelectasis at the right lung base. No pneumothorax observed.   Electronically Signed   By: Herbie Baltimore M.D.   On: 09/01/2014 17:01   Dg Chest Port 1 View  08/28/2014   CLINICAL DATA:  Chest pain  EXAM: PORTABLE CHEST - 1 VIEW  COMPARISON:  07/31/2010  FINDINGS: Mild cardiomegaly with central vascular congestion. Tortuous atherosclerotic descending thoracic aorta. Chronic medial left lower lobe atelectasis along the aortic contour. No definite focal pneumonia, collapse or consolidation. No edema, effusion or pneumothorax. Trachea midline.  IMPRESSION: Cardiomegaly with vascular congestion  Chronic left lower lobe atelectasis  Stable exam   Electronically Signed   By: Ruel Favors M.D.   On: 08/28/2014 09:53   Ct Angio Chest Aortic Dissect W &/or W/o  09/01/2014   CLINICAL DATA:  Intramural hematoma, thoracic aortic aneurysm  EXAM: CT ANGIOGRAPHY CHEST WITH CONTRAST  TECHNIQUE: Multidetector CT imaging of the chest was performed using the standard protocol during bolus administration of intravenous contrast. Multiplanar CT image reconstructions and MIPs were obtained  to evaluate the vascular anatomy.  CONTRAST:  80mL OMNIPAQUE IOHEXOL 350 MG/ML SOLN  COMPARISON:  08/28/2014  FINDINGS: Contrast reflux from the right atrium into hepatic veins. There good contrast opacification of pulmonary artery branches with no filling defects to suggest acute PE. There is compression of the superior left pulmonary vein by at the descending thoracic aortic aneurysm. Mild plaque in the ascending aorta. Classic 3 vessel brachiocephalic arterial origin anatomy without proximal stenosis.  Hyperdense intramural hematoma in the distal arch beyond the subclavian origin extending into the distal descending thoracic segment. There is scattered plaque and displaced intimal calcifications. Fusiform aneurysmal dilatation of the mid descending thoracic aorta, 5.1 x 5.2 cm maximum  transverse dimensions, stable since previous study, tapering to a diameter of 3 cm, tortuous in its distal descending segment above the diaphragm. The intramural hematoma does not extend below the diaphragm. The visualized proximal abdominal aorta shows moderate atheromatous change without dissection, aneurysm, or stenosis. Origin stenosis of the celiac axis with distal aneurysmal dilatation to 17 mm.  New bilateral pleural effusions left greater than right. No pericardial effusion. Mild four-chamber cardiac enlargement. No hilar or mediastinal adenopathy identified. There is consolidation/ atelectasis in the posterior left lower lobe. Dependent atelectasis in the right lower lobe and left upper lobe. 11mm nodule, right middle lobe image 65/6, previously 8 mm on 06/22/2010.  Review of the MIP images confirms the above findings.  IMPRESSION: 1. Stable appearance of intramural hemorrhage in the descending thoracic aorta and 5.2 cm mid descending fusiform aneurysm. This results in compression of the adjacent superior left pulmonary vein. 2. New pleural effusions left greater than right, with consolidation/atelectasis in the posterior left lower lobe 3. 11 mm right lower lobe pulmonary nodule, previously 8 mm on 06/22/2010. Follow-up recommended to exclude low-grade neoplasm.   Electronically Signed   By: Oley Balm M.D.   On: 09/01/2014 13:26   Ct Angio Abd/pel W/ And/or W/o  08/28/2014   CLINICAL DATA:  Chest pain, back pain, history of abdominal aortic aneurysm, diabetes, hypertension  EXAM: CT ANGIOGRAPHY CHEST, ABDOMEN AND PELVIS  TECHNIQUE: Multidetector CT imaging through the chest, abdomen and pelvis was performed using the standard protocol during bolus administration of intravenous contrast. Multiplanar reconstructed images and MIPs were obtained and reviewed to evaluate the vascular anatomy.  CONTRAST:  80mL OMNIPAQUE IOHEXOL 350 MG/ML SOLN  COMPARISON:  06/22/2010  FINDINGS: CTA CHEST FINDINGS   Proximal descending thoracic aorta demonstrates wall thickening with hyperattenuation on the noncontrast phase beginning just distal to the left subclavian artery and extending throughout the descending thoracic aorta diffusely. This appearance is consistent with descending thoracic aortic acute intramural hemorrhage. Postcontrast, there are 2 areas of wall enhancement within the thickened hyperdense wall, image 32 and 39 of series 5 suspicious for ongoing intramural hemorrhage versus areas of small ulcerated plaque formation. Also, there is a fusiform atherosclerotic descending thoracic aortic aneurysm measuring 5.1 x 5.2 cm, image 66 series 5. The transverse aorta and ascending aortic segments do not appear to be involved. Major branch vessels remain patent. No mediastinal hemorrhage or pericardial effusion. No pleural effusion. Negative for adenopathy. Mild enlargement of the inferior thyroid with scattered hypodense nodules.  Lung windows demonstrate respiratory motion artifact. Bibasilar minor atelectasis noted. Right middle lobe well-circumscribed 8 mm nodule again noted, stable compared to 06/22/2010 supporting a benign nodule. No focal pneumonia, collapse or consolidation. Negative for edema or pneumothorax. Trachea and central airways are patent. Coronary calcifications evident.  Review of the MIP images  confirms the above findings.  CTA ABDOMEN AND PELVIS FINDINGS  Mild ectasia of the abdominal aorta diffusely with calcific atherosclerosis. Patient appears status post infrarenal aortic aneurysm repair. No significant recurrent abdominal aneurysm, hemorrhage, retroperitoneal abnormality. 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 This can be seen with median arcuate ligament syndrome. SMA and main renal arteries are patent. 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  Tortuous atherosclerotic iliac vessels.  Common, internal and external iliac arteries remain patent. No iliac aneurysm. Visualized common femoral, proximal profunda femoral and superficial femoral arteries are also patent.  Nonvascular: Stable small 12 mm hypodense cyst in the right hepatic dome. No biliary dilatation. Gallbladder, biliary system, pancreas, spleen, and adrenal glands are within normal limits for age and arterial phase imaging.  Kidneys demonstrate cortical thinning/ scarring but normal enhancement and excretion. Scattered small renal cysts noted as before. No significant interval change.  Negative for bowel obstruction, dilatation, ileus, or free air. Diffuse colonic diverticulosis noted.  No abdominal free fluid, fluid collection, hemorrhage, abscess, or adenopathy.  Within the pelvis, there is a chronic sigmoid diverticulosis. Small incidental 2 cm left ovarian cyst, image 243 series 5. Urinary bladder unremarkable. Rectum is stool-filled. No pelvic free fluid, fluid collection, hemorrhage, abscess, adenopathy, inguinal abnormality, or hernia. Prior hysterectomy noted.  Bones are osteopenic. Diffuse degenerative changes of the spine and associated scoliosis.  Review of the MIP images confirms the above findings.  IMPRESSION: Acute intramural hemorrhage of the descending thoracic aorta with an associated mid descending thoracic aortic 5 cm aneurysm.  Abdominal aortic atherosclerotic changes, status post AAA repair.  Stable 8 mm right middle lobe well-circumscribed nodule compared to 2011 compatible with a benign nodule.  Stable hepatic and renal cysts  Bibasilar atelectasis  Colonic diverticulosis  No acute intra-abdominal or pelvic process  These results were called by telephone at the time of interpretation on 08/28/2014 at 2:23 pm to Dr. Jaci Carrel , who verbally acknowledged these results.   Electronically Signed   By: Ruel Favors M.D.   On: 08/28/2014 14:28    PHYSICAL EXAM General: NAD Neck: JVP not elevated, no  thyromegaly or thyroid nodule.  Lungs: Decreased breath sounds at bases bilaterally. CV: Nondisplaced PMI.  Heart regular S1/S2, no S3/S4, no murmur.  No peripheral edema.  No carotid bruit.  Normal pedal pulses.  Abdomen: Soft, nontender, no hepatosplenomegaly, no distention.  Neurologic: Alert and oriented x 3.  Psych: Normal affect. Extremities: No clubbing or cyanosis.   TELEMETRY: Reviewed telemetry pt in [facility]  ASSESSMENT AND PLAN: 80 yo with history of AAA repair presented with acute intramural hematoma descending aorta beyond left subclavian.  1. Aortic intramural hematoma: Descending thoracic aorta beyond the left subclavian.  Followed by Dr Laneta Simmers, recommends stent graft repair about 1 month after event as this will significantly decrease risk of complications with the procedure.  She has had ongoing mild on and off chest pain, no back pain.  Not clear that this pain actually comes from the aorta.  BP control improving, still not ideal.  - Continue felodipine 10 mg daily and labetalol 400 mg tid.  Will increase spironolactone to 25 mg daily.  Will need to watch for BP overshoot as we had last week.  2. Acute diastolic CHF: EF 16-10% on last echo.  She does not look particularly volume overloaded on exam and CVP is 2-3.  She has an ongoing oxygen requirement in  the setting of pleural effusions.   - Stop Lasix today.  - Will get CXR PA/lateral to assess pleural effusion.  ?thoracentesis.   3. CKD: Creatinine up a bit today, CVP not elevated.  Stop IV Lasix as above.  4. CAD: Significant coronary calcification on her chest CTs, continues to have on and off chest pain.  I suspect that she will need coronary angiography if plan continues to be aggressive treatment.  Would consider radial cath later in week but would like to see creatinine stabilize first.  Will add statin given CAD by CT.  5. Will need ongoing PT, very weak. 6. May go to telemetry today.   Marca Ancona 09/06/2014

## 2014-09-07 LAB — GLUCOSE, CAPILLARY
GLUCOSE-CAPILLARY: 164 mg/dL — AB (ref 70–99)
GLUCOSE-CAPILLARY: 110 mg/dL — AB (ref 70–99)
GLUCOSE-CAPILLARY: 135 mg/dL — AB (ref 70–99)
Glucose-Capillary: 155 mg/dL — ABNORMAL HIGH (ref 70–99)

## 2014-09-07 LAB — BASIC METABOLIC PANEL
Anion gap: 17 — ABNORMAL HIGH (ref 5–15)
BUN: 36 mg/dL — ABNORMAL HIGH (ref 6–23)
CHLORIDE: 98 meq/L (ref 96–112)
CO2: 24 meq/L (ref 19–32)
Calcium: 10.2 mg/dL (ref 8.4–10.5)
Creatinine, Ser: 1.39 mg/dL — ABNORMAL HIGH (ref 0.50–1.10)
GFR calc non Af Amer: 33 mL/min — ABNORMAL LOW (ref >90)
GFR, EST AFRICAN AMERICAN: 38 mL/min — AB (ref >90)
Glucose, Bld: 146 mg/dL — ABNORMAL HIGH (ref 70–99)
POTASSIUM: 3.9 meq/L (ref 3.7–5.3)
SODIUM: 139 meq/L (ref 137–147)

## 2014-09-07 LAB — CBC
HEMATOCRIT: 32.9 % — AB (ref 36.0–46.0)
HEMOGLOBIN: 10.4 g/dL — AB (ref 12.0–15.0)
MCH: 28.5 pg (ref 26.0–34.0)
MCHC: 31.6 g/dL (ref 30.0–36.0)
MCV: 90.1 fL (ref 78.0–100.0)
Platelets: 323 10*3/uL (ref 150–400)
RBC: 3.65 MIL/uL — ABNORMAL LOW (ref 3.87–5.11)
RDW: 15.1 % (ref 11.5–15.5)
WBC: 10.2 10*3/uL (ref 4.0–10.5)

## 2014-09-07 NOTE — Progress Notes (Signed)
Patient Profile: Pt admitted 09/27 w/ chest pain. Dx w/ descending thoracic AA, seen by Dr. Laneta Simmers and tight BP control recommended. TEVAR a possibility.    Subjective: Currently working with PT. She is getting ready to ambulate. Doing "OK" this morning. Still with off and on chest pain described as a dull ache. No back pain. No abdominal pain.   Objective: Vital signs in last 24 hours: Temp:  [97.8 F (36.6 C)-98.2 F (36.8 C)] 98 F (36.7 C) (10/07 0449) Pulse Rate:  [65-66] 66 (10/07 0449) Resp:  [13-20] 18 (10/07 0449) BP: (115-141)/(55-66) 141/66 mmHg (10/07 0449) SpO2:  [89 %-96 %] 96 % (10/07 0449) Weight:  [145 lb (65.772 kg)] 145 lb (65.772 kg) (10/07 0449) Last BM Date: 09/06/14  Intake/Output from previous day: 10/06 0701 - 10/07 0700 In: 666 [P.O.:666] Out: 125 [Urine:125] Intake/Output this shift:    Medications Current Facility-Administered Medications  Medication Dose Route Frequency Provider Last Rate Last Dose  . 0.9 %  sodium chloride infusion   Intravenous Continuous Lewayne Bunting, MD      . acetaminophen (TYLENOL) tablet 650 mg  650 mg Oral Q4H PRN Leone Brand, NP   650 mg at 09/06/14 2035  . ALPRAZolam Prudy Feeler) tablet 0.25 mg  0.25 mg Oral BID PRN Cyril Mourning V, MD      . antiseptic oral rinse (CPC / CETYLPYRIDINIUM CHLORIDE 0.05%) solution 7 mL  7 mL Mouth Rinse BID Duke Salvia, MD   7 mL at 09/06/14 2200  . atorvastatin (LIPITOR) tablet 20 mg  20 mg Oral q1800 Laurey Morale, MD   20 mg at 09/06/14 1704  . bisacodyl (DULCOLAX) EC tablet 10 mg  10 mg Oral Daily PRN Cyril Mourning V, MD      . calcium carbonate (OS-CAL - dosed in mg of elemental calcium) tablet 500 mg of elemental calcium  1 tablet Oral BID WC Duke Salvia, MD   500 mg of elemental calcium at 09/06/14 1704  . docusate (COLACE) 50 MG/5ML liquid 100 mg  100 mg Oral Daily Cyril Mourning V, MD   100 mg at 09/05/14 1102  . felodipine (PLENDIL) 24 hr tablet 10 mg  10 mg Oral Daily Laurey Morale, MD   10 mg at 09/06/14 1025  . hydrALAZINE (APRESOLINE) injection 10-40 mg  10-40 mg Intravenous Q4H PRN Oretha Milch, MD   10 mg at 09/06/14 0643  . HYDROcodone-acetaminophen (NORCO/VICODIN) 5-325 MG per tablet 1 tablet  1 tablet Oral Q6H PRN Leone Brand, NP   1 tablet at 09/07/14 0255  . insulin aspart (novoLOG) injection 0-5 Units  0-5 Units Subcutaneous QHS Leone Brand, NP   2 Units at 08/28/14 2119  . insulin aspart (novoLOG) injection 0-9 Units  0-9 Units Subcutaneous TID WC Leone Brand, NP   2 Units at 09/07/14 0809  . labetalol (NORMODYNE) tablet 400 mg  400 mg Oral TID Laurey Morale, MD   400 mg at 09/06/14 2203  . labetalol (NORMODYNE,TRANDATE) injection 10-20 mg  10-20 mg Intravenous Q4H PRN Cyril Mourning V, MD      . morphine 2 MG/ML injection 2-4 mg  2-4 mg Intravenous Q2H PRN Othella Boyer, MD   2 mg at 09/07/14 0824  . ondansetron (ZOFRAN) injection 4 mg  4 mg Intravenous Q6H PRN Leone Brand, NP   4 mg at 08/29/14 0022  . polyvinyl alcohol (LIQUIFILM TEARS) 1.4 % ophthalmic solution 1 drop  1 drop Both Eyes BID Duke Salvia, MD   1 drop at 09/06/14 1023  . sodium chloride 0.9 % injection 10-40 mL  10-40 mL Intracatheter Q12H Oretha Milch, MD   10 mL at 09/06/14 1026  . sodium chloride 0.9 % injection 10-40 mL  10-40 mL Intracatheter PRN Cyril Mourning V, MD      . spironolactone (ALDACTONE) tablet 25 mg  25 mg Oral Daily Laurey Morale, MD   25 mg at 09/06/14 1204  . witch hazel-glycerin (TUCKS) pad   Topical PRN Laurann Montana, PA-C   1 application at 09/05/14 2137    PE: General appearance: alert, cooperative and no distress Lungs: clear to auscultation bilaterally Heart: regular rate and rhythm and 2/6 blowing murmur at the apex Extremities: no LEE Pulses: 2+ and symmetric Skin: warm and dry Neurologic: Grossly normal  Lab Results:   Recent Labs  09/06/14 0400 09/07/14 0328  WBC 9.5 10.2  HGB 10.0* 10.4*  HCT 31.6* 32.9*  PLT 298 323    BMET  Recent Labs  09/05/14 0830 09/06/14 0400 09/07/14 0328  NA 140 141 139  K 3.8 4.3 3.9  CL 103 101 98  CO2 26 25 24   GLUCOSE 190* 145* 146*  BUN 26* 32* 36*  CREATININE 1.03 1.25* 1.39*  CALCIUM 9.3 9.8 10.2     Assessment/Plan  Active Problems:   Intramural aortic hematoma   Acute respiratory failure with hypoxia   Acute pulmonary edema   Cardiogenic shock  1. Aortic intramural hematoma: Descending thoracic aorta beyond the left subclavian. Followed by Dr Laneta Simmers, recommends stent graft repair about 1 month after event as this will significantly decrease risk of complications with the procedure. She has had ongoing mild on and off chest pain, no back pain. Not clear that this pain actually comes from the aorta. BP control improving, still not ideal. In the 140s systolic this am.   - Continue felodipine 10 mg daily and labetalol 400 mg tid. Spironolactone was increased to 25 mg daily yesterday. Will need to watch for BP overshoot as we had last week.    2. Acute diastolic CHF: EF 40-98% on last echo. Appears euvolemic. Lungs are CTAB and no peripheral edema. -Lasix has been on hold.  -CXR 10/6 demonstrated improving aeration in the LLL and minimal right base atelectasis.  3. CKD: Creatinine continues to increase, now at 1.39 (1.25 yesterday). Lasix is on hold.    4. CAD: Significant coronary calcification on her chest CTs, continues to have on and off chest pain. She will likely need coronary angiography if plan continues to be aggressive treatment. ? Right radial cath later in week but would like to see creatinine stabilize first. Statin added yesterday given CAD by CT.   5. Will need ongoing PT, very weak.     LOS: 10 days    Jennifer [name] 09/07/2014 9:16 AM  Patient seen, examined. Available data reviewed. Agree with findings, assessment, and plan as outlined by Jennifer Lis, PA-C. The patient was independently interviewed and examined. Exam  reveals a very pleasant elderly woman in no distress. Lungs are clear. Heart is regular rate and rhythm without murmur. Extremities show no peripheral edema. I have reviewed extensive data. The patient continues to have chest pain intermittently. She received hydrocodone this morning and states that this helped substantially. She is not having any pain at present. Would continue current therapies. I am going to arrange a [employer]. Considering  her low GFR and advanced age as well as negative troponins, I think this is favorable in comparison with cardiac catheterization. Continue with physical therapy. Hopefully she will be ready for discharge in the next 48 hours with home health. Plan as noted for possible thoracic stent graft in approximately one month.  Tonny BollmanMichael Anella [name], M.D. 09/07/2014 12:14 PM

## 2014-09-07 NOTE — Progress Notes (Signed)
Physical Therapy Treatment Patient Details Name: Jennifer MusselJANIE B Fusco MRN: [id number] DOB: 11/04/1925 Today's Date: 09/07/2014    History of Present Illness Pt is an 80 yo female who presented to ED 9/27 with chest pain. She was admitted with suspicion for aortic aneurysm vs PE. A CT of the chest showed acute intramural hemorrhage of the descending thoracic aorta. Pt continues to have chest pain and may have cardiac cath later this week.    PT Comments    Pt making slow progress. Remains weak and with low activity tolerance.  Follow Up Recommendations  Home health PT;Supervision/Assistance - 24 hour     Equipment Recommendations  Rolling walker with 5" wheels;3in1 (PT)    Recommendations for Other Services       Precautions / Restrictions Precautions Precautions: Fall Restrictions Weight Bearing Restrictions: No    Mobility  Bed Mobility   Bed Mobility: Supine to Sit     Supine to sit: Min assist     General bed mobility comments: Assist to bring trunk up into sitting.  Transfers Overall transfer level: Needs assistance Equipment used: Rolling walker (2 wheeled) Transfers: Sit to/from [employer]Stand;Stand Pivot Transfers Sit to Stand: Min assist Stand pivot transfers: Min assist       General transfer comment: Assist to bring hips up and for balance. Verbal cues for hand placement.  Ambulation/Gait Ambulation/Gait assistance: Min assist Ambulation Distance (Feet): 70 Feet Assistive device: Rolling walker (2 wheeled) Gait Pattern/deviations: Step-through pattern;Decreased step length - right;Decreased step length - left;Trunk flexed;Shuffle Gait velocity: decr Gait velocity interpretation: Below normal speed for age/gender General Gait Details: Verbal cues to stay closer to walker and to stand more erect.   Stairs            Wheelchair Mobility    Modified Rankin (Stroke Patients Only)       Balance Overall balance assessment: Needs  assistance Sitting-balance support: No upper extremity supported;Feet supported Sitting balance-Leahy Scale: Fair     Standing balance support: Bilateral upper extremity supported Standing balance-Leahy Scale: Poor Standing balance comment: Support of walker and +1 min A to stand                     Cognition Arousal/Alertness: Awake/alert Behavior During Therapy: WFL for tasks assessed/performed Overall Cognitive Status: Within Functional Limits for tasks assessed                      Exercises      General Comments        Pertinent Vitals/Pain Pain Assessment: 0-10 Pain Score: 9  Pain Location: chest Pain Descriptors / Indicators: Nagging Pain Intervention(s): Repositioned;Limited activity within patient's tolerance;Monitored during session    Home Living                      Prior Function            PT Goals (current goals can now be found in the care plan section) Progress towards PT goals: Progressing toward goals    Frequency  Min 3X/week    PT Plan Current plan remains appropriate    Co-evaluation             End of Session Equipment Utilized During Treatment: Gait belt Activity Tolerance: Patient limited by fatigue Patient left: in chair;with call bell/phone within reach     Time: 0911-0949 PT Time Calculation (min): 38 min  Charges:  $Gait Training: 38-52 mins  G Codes:      Jakya Dovidio 09/07/2014, 10:18 AM  [facility] PT [phone]

## 2014-09-08 ENCOUNTER — Inpatient Hospital Stay (HOSPITAL_COMMUNITY): Payer: Medicare Other

## 2014-09-08 ENCOUNTER — Encounter (HOSPITAL_COMMUNITY): Payer: Medicare Other

## 2014-09-08 DIAGNOSIS — I5033 Acute on chronic diastolic (congestive) heart failure: Secondary | ICD-10-CM | POA: Diagnosis not present

## 2014-09-08 DIAGNOSIS — R079 Chest pain, unspecified: Secondary | ICD-10-CM

## 2014-09-08 DIAGNOSIS — I7123 Aneurysm of the descending thoracic aorta, without rupture: Secondary | ICD-10-CM | POA: Diagnosis present

## 2014-09-08 DIAGNOSIS — I712 Thoracic aortic aneurysm, without rupture: Secondary | ICD-10-CM | POA: Diagnosis present

## 2014-09-08 DIAGNOSIS — N183 Chronic kidney disease, stage 3 unspecified: Secondary | ICD-10-CM | POA: Diagnosis present

## 2014-09-08 LAB — GLUCOSE, CAPILLARY
GLUCOSE-CAPILLARY: 152 mg/dL — AB (ref 70–99)
Glucose-Capillary: 167 mg/dL — ABNORMAL HIGH (ref 70–99)
Glucose-Capillary: 144 mg/dL — ABNORMAL HIGH (ref 70–99)
Glucose-Capillary: 135 mg/dL — ABNORMAL HIGH (ref 70–99)

## 2014-09-08 MED ORDER — HYDROCODONE-ACETAMINOPHEN 5-325 MG PO TABS
ORAL_TABLET | ORAL | Status: AC
  Administered 2014-09-08: 1
  Filled 2014-09-08: qty 1

## 2014-09-08 MED ORDER — TECHNETIUM TC 99M SESTAMIBI GENERIC - CARDIOLITE
10.0000 | Freq: Once | INTRAVENOUS | Status: AC | PRN
  Administered 2014-09-08: 10 via INTRAVENOUS

## 2014-09-08 MED ORDER — REGADENOSON 0.4 MG/5ML IV SOLN
0.4000 mg | Freq: Once | INTRAVENOUS | Status: AC
  Administered 2014-09-08: 0.4 mg via INTRAVENOUS

## 2014-09-08 MED ORDER — REGADENOSON 0.4 MG/5ML IV SOLN
INTRAVENOUS | Status: AC
  Filled 2014-09-08: qty 5

## 2014-09-08 MED ORDER — TECHNETIUM TC 99M SESTAMIBI GENERIC - CARDIOLITE
30.0000 | Freq: Once | INTRAVENOUS | Status: AC | PRN
  Administered 2014-09-08: 30 via INTRAVENOUS

## 2014-09-08 NOTE — Progress Notes (Signed)
Left IJ TL catheter is not in optimal postion with tip at the confluence of the SVC and Left brachiocephalic vein.  Might consider replacing this if being used for long tern IV infusions.  Should not be used for certain infusions including TNA, chemo and other irritating infusates

## 2014-09-08 NOTE — Progress Notes (Signed)
Patient Name: Jennifer MusselJANIE B [name] Date of Encounter: 09/08/2014     Principal Problem:   Left sided chest pain Active Problems:   Intramural aortic hematoma   Acute respiratory failure with hypoxia   Descending thoracic aortic aneurysm   Acute pulmonary edema   Acute on chronic diastolic CHF (congestive heart failure), NYHA class 3   CKD (chronic kidney disease), stage III   Cardiogenic shock    SUBJECTIVE  Some left sided chest pain overnight.  For myoview today.  BP stable.  CURRENT MEDS . antiseptic oral rinse  7 mL Mouth Rinse BID  . atorvastatin  20 mg Oral q1800  . calcium carbonate  1 tablet Oral BID WC  . docusate  100 mg Oral Daily  . felodipine  10 mg Oral Daily  . insulin aspart  0-5 Units Subcutaneous QHS  . insulin aspart  0-9 Units Subcutaneous TID WC  . labetalol  400 mg Oral TID  . polyvinyl alcohol  1 drop Both Eyes BID  . regadenoson      . sodium chloride  10-40 mL Intracatheter Q12H  . spironolactone  25 mg Oral Daily    OBJECTIVE  Filed Vitals:   09/08/14 0416 09/08/14 0940 09/08/14 0943 09/08/14 0945  BP: 127/54 154/69 133/55 114/55  Pulse: 62     Temp: 98.3 F (36.8 C)     TempSrc: Oral     Resp: 17     Height:      Weight: 145 lb 4.8 oz (65.908 kg)     SpO2: 90%       Intake/Output Summary (Last 24 hours) at 09/08/14 0945 Last data filed at 09/07/14 1700  Gross per 24 hour  Intake    480 ml  Output      0 ml  Net    480 ml   Filed Weights   09/06/14 0300 09/07/14 0449 09/08/14 0416  Weight: 147 lb 0.8 oz (66.7 kg) 145 lb (65.772 kg) 145 lb 4.8 oz (65.908 kg)    PHYSICAL EXAM  General: Pleasant, NAD. Neuro: Alert and oriented X 3. Moves all extremities spontaneously. Psych: Normal affect. HEENT:  Normal  Neck: Supple without bruits or JVD. Lungs:  Resp regular and unlabored, CTA. Heart: RRR no s3, s4, or murmurs. Abdomen: Soft, non-tender, non-distended, BS + x 4.  Extremities: No clubbing, cyanosis or edema. DP/PT/Radials  2+ and equal bilaterally.  Accessory Clinical Findings  CBC  Recent Labs  09/06/14 0400 09/07/14 0328  WBC 9.5 10.2  HGB 10.0* 10.4*  HCT 31.6* 32.9*  MCV 90.3 90.1  PLT 298 323   Basic Metabolic Panel  Recent Labs  09/06/14 0400 09/07/14 0328  NA 141 139  K 4.3 3.9  CL 101 98  CO2 25 24  GLUCOSE 145* 146*  BUN 32* 36*  CREATININE 1.25* 1.39*  CALCIUM 9.8 10.2   TELE  Seen in nuc med.  Radiology/Studies  Dg Chest 2 View  09/06/2014   CLINICAL DATA:  Pleural effusions, history diabetes mellitus without complication, hypertension, abdominal aortic aneurysm  EXAM: CHEST  2 VIEW    IMPRESSION: Improving aeration LEFT lower lobe.  Enlargement of cardiac silhouette with minimal RIGHT base atelectasis.   Electronically Signed   By: Ulyses SouthwardMark  Boles M.D.   On: 09/06/2014 14:18   ASSESSMENT AND PLAN  1.  Left sided chest pain:  No obj evidence of ischemia.  Ef 65% by echo this admission.  Continues to have intermittent chest pain.  Myoview  today.  2.  HTN:  Stable over past 24 hrs.  Cont current meds.  3.  Acute on chronic diast CHF:  euvolemic on exam.  Wt 145 - down from max of 156 on 10/2.  + 6.8L since admission - ? Accuracy.  Cont bp control with bb, ccb, spiro.  4.  Desc thoracic Ao aneurysm with intramural hematoma:  BP stable.  F/U Dr. Laneta Simmers as an outpt for consideration of stent grafting.  5.  CKD III:  Creat stable.    Signed, Nicolasa Ducking NP  Patient seen, examined. Available data reviewed. Agree with findings, assessment, and plan as outlined by Ward Givens, NP. Exam reveals alert, pleasant, elderly woman in NAD. Lungs CTA, heart RRR, no peripheral edema. Her chest pain has resolved today. Await Myoview. Spoke with patient at length and grandson who is at bedside. Consider discharge home tomorrow as long as stress Myoview is low-risk. She will need Home Health services and family support at discharge. Outpatient follow-up with Dr Laneta Simmers.  Tonny Bollman,  M.D. 09/08/2014 2:05 PM

## 2014-09-09 LAB — GLUCOSE, CAPILLARY
GLUCOSE-CAPILLARY: 115 mg/dL — AB (ref 70–99)
Glucose-Capillary: 114 mg/dL — ABNORMAL HIGH (ref 70–99)
Glucose-Capillary: 152 mg/dL — ABNORMAL HIGH (ref 70–99)
Glucose-Capillary: 129 mg/dL — ABNORMAL HIGH (ref 70–99)

## 2014-09-09 NOTE — Progress Notes (Signed)
    Subjective:  Continues to have "knawing pain" in her left chest. Asking for pain medication. No shortness of breath. She is apprehensive about the idea of going home today. She does not feel ready. Continues to feel quite weak.  Objective:  Vital Signs in the last 24 hours: Temp:  [98.1 F (36.7 C)-98.3 F (36.8 C)] 98.3 F (36.8 C) (10/09 0653) Pulse Rate:  [61-63] 62 (10/09 0653) Resp:  [17-19] 17 (10/09 0653) BP: (97-154)/(52-69) 140/58 mmHg (10/09 0653) SpO2:  [90 %-93 %] 92 % (10/09 0653) Weight:  [145 lb 4.8 oz (65.908 kg)] 145 lb 4.8 oz (65.908 kg) (10/09 0653)  Intake/Output from previous day: 10/08 0701 - 10/09 0700 In: 480 [P.O.:480] Out: -   Physical Exam: Pt is alert and oriented, pleasant elderly woman in NAD HEENT: normal Neck: JVP - normal Lungs: CTA bilaterally CV: RRR without murmur or gallop Abd: soft, NT, Positive BS, no hepatomegaly Ext: no C/C/E, distal pulses intact and equal Skin: warm/dry no rash   Lab Results:  Recent Labs  09/07/14 0328  WBC 10.2  HGB 10.4*  PLT 323    Recent Labs  09/07/14 0328  NA 139  K 3.9  CL 98  CO2 24  GLUCOSE 146*  BUN 36*  CREATININE 1.39*   No results found for this basename: TROPONINI, CK, MB,  in the last 72 hours  Cardiac Studies: Myoview: IMPRESSION:  1. Small sized, mild in intensity fixed defects noted in the apical  inferoseptum and mid and basal inferolateral walls consistent with  diaphragmatic attenuation. No reversible ischemia or infarction.  2. Normal left ventricular wall motion.  3. Left ventricular ejection fraction 73%  4. Low-risk stress test findings.   Tele: Personally reviewed. Normal sinus rhythm. No significant arrhythmia.  Assessment/Plan:  1. Acute intramural hematoma of the descending thoracic aorta. Plans per Dr. Laneta SimmersBartle for stent grafting in approximately 1 month. Stability demonstrated on serial CTA. Continue blood pressure management and as needed  analgesics.  2. Acute diastolic heart failure. LVEF preserved by echo at 60-65%. Blood pressure is controlled on current therapy. No signs of volume overload on exam today.  3. Chronic kidney disease, stage III. Creatinine essentially stable with furosemide on hold. Will repeat tomorrow morning.  4. Coronary artery disease, diagnosis based on coronary calcification by chest CT. Stress Myoview result reviewed as above without ischemia. Continue medical therapy. I do not think her chest pain is anginal in nature.  5. Generalized weakness. Continue with physical therapy and mobilization.  Plan for today: Oral analgesics for pain, mobilize, physical therapy, repeat metabolic panel tomorrow morning. Consider discharge today, but patient not ready. May be appropriate for discharge at next 24-48 hours.   Tonny BollmanMichael Chrisma Hurlock, M.D. 09/09/2014, 8:04 AM

## 2014-09-09 NOTE — Progress Notes (Signed)
UR complete.  Travia Onstad RN, MSN 

## 2014-09-09 NOTE — Discharge Summary (Signed)
Discharge Summary   Patient ID: Jennifer [name] MRN: [id number], DOB/AGE: [birth year] 78 y.o. Admit date: 08/28/2014 D/C date:     09/12/2014  Primary MD: Dr. Earl Gala Primary Cardiologist: Dr. Eldridge Dace   Principal Problem:   Left sided chest pain Active Problems:   Intramural aortic hematoma   Acute respiratory failure with hypoxia   Acute pulmonary edema   Cardiogenic shock   Descending thoracic aortic aneurysm   Acute on chronic diastolic CHF (congestive heart failure), NYHA class 3   CKD (chronic kidney disease), stage III   Physical deconditioning    Admission Dates: 08/28/14-09/09/14 Discharge Diagnosis: Descending thoracic aortic aneurysm and intramural hematoma  Consult: TCTS, CCM, physical therapy  Procedures: Ct Angio Chest W/cm &/or Wo Cm, Lexi scan nuclear stress test, barium swallow/esophagram, Ct Angio Chest Aortic Dissect W &/or W/o, Ct Angio Abd/pel W/ And/or W/o   HPI: Jennifer [name] is a 80 y.o. female with a history of HTN, DM, AAA s/p repair (2011) and GERD with hx of ulcers who presented to [facility] ED on 08/28/14 with chest pain, radiating through to her back.   Hospital Course: She came to the ER where her ECG was unremarkable and troponin was normal. She had a CTA which showed an intramural hematoma in the descending aorta extending from distal to the left subclavian to the distal descending thoracic aorta. There were 2 areas of wall enhancement within the thickened hyperdense wall suspicious for ongoing intramural hemorrhage or area of small ulcerated plaque formation. There was also a fusiform atherosclerotic aneurysm in the mid descending aorta with a maximum diameter of 5.2 cm. She was hypertensive on presentation at 179/102.  Thoracic aortic aneurysm with intramural hematoma involving descending thoracic aorta-Patient with chest pain at times requiring multiple medications, especially if her BP was elevated. Continue felodipine and IV labetalol was given. She was  later changed to oral labetalol. -- Dr Laneta Simmers has reviewed; ascending aorta not involved; continue medical therapy for now with tight BP control; there remained concern about risk of rupture given intramural hematoma superimposed on TAA measuring 52 mm -- She had ongoing chest pain so a repeat CTA of aorta was done on 09/01/14 -- reviewed her case with Dr. Myra Gianotti and she would be a candidate for stent graft repair of her aneurysm, but ideally would like to wait for at least a month to allow the aortic wall hematoma to heal.  HTN- she was placed on plendil and IV labetalol. She had ongoing issues with unontrolled hypertension. She also had problems with hypotension at times and her medications are required frequent adjustments. By discharge, her systolic blood pressure was ranging from 97 up to 152 over the course of the day. She is tolerating current medical therapy well and will be followed as an outpatient.  DM- CBGs were followed. Sliding scale and was used during her hospital stay to control her blood sugars. Hemoglobin A1c was 6.8.  HLD- She had been on gemfibrozil prior to admission and this was continued.  Acute kidney failure- BUN and CR mildly increased initially and she was hydrated. This eventually improved and she became a little volume overloaded requiring IV diuresis. Her peak BUN/creatinine was 32/1.25. Discharge labs are below. This will be followed as an outpatient. Furosemide is currently on hold.  Cardiogenic shock - on 10/01, she decompensated, becoming hypoxic and short of breath. She was encephalopathic by exam. A CCM and consult was called. She was hypoxic and a chest x-ray showed pulmonary  edema. She was felt to have acute hypoxemic respiratory failure with pulmonary edema but did not require intubation. BiPAP was used and she was diuresed. She was on dopamine but was later changed to norepinephrine for better blood pressure control. As she improved, the pressors were  discontinued and her blood pressure medications were watched very closely. A repeat CT did not show any bleeding or change in her aneurysm.  Chest pain - cardiac enzymes were negative for MI. However, because of recurrent chest pain she had a stress test. A Lexi scan nuclear stress test was performed on 10/08. Results are below. She had some fixed defects but no ischemia or infarction injury of was 73%. Cardiac risk factor reduction is recommended. Her chest pain was not felt to be anginal in nature.  Acute diastolic CHF - her weight was 161 pounds on admission and got up to 146 pounds. However, she diuresed and was 146 pounds at discharge. She will be encouraged to continue daily weights and had no signs of volume overload at discharge.  Deconditioning - a physical therapy consult was called because the patient's ability was extremely limited. She was felt to have generalized weakness with decreased strength and activity tolerance as well as decreased balance and mobility. She will need he functional mobility training with therapeutic exercises as well.  The patient has had a complicated hospital course but is recovering well. The radial / femoral catheter site is stable. She became very weak and was felt to be deconditioned. She was seen by physical therapy and initially refused placement. However, she was strongly advised to have 24-hour supervision and agreed to go to a skilled nursing facility.  She has been seen by Dr. Anne Fu today and deemed ready for discharge home. All follow-up appointments have been scheduled. Smoking cessation was disscussed at length. A 30 day free supply of Brilinta was provided for the patient. A work excuse note was provided as well. Discharge medications include...   Discharge Vitals: Blood pressure 144/66, pulse 71, temperature 98 F (36.7 C), temperature source Oral, resp. rate 20, height 5\' 3"  (1.6 m), weight 146 lb 3.2 oz (66.316 kg), SpO2 99.00%.  Labs: Lab  Results  Component Value Date   WBC 10.2 09/07/2014   HGB 10.4* 09/07/2014   HCT 32.9* 09/07/2014   MCV 90.1 09/07/2014   PLT 323 09/07/2014     Recent Labs Lab 09/11/14 0855  NA 135*  K 4.2  CL 99  CO2 22  BUN 27*  CREATININE 1.11*  CALCIUM 10.2  GLUCOSE 180*    Lab Results  Component Value Date   CHOL 199 08/29/2014   HDL 33* 08/29/2014   LDLCALC 99 08/29/2014   TRIG 336* 08/29/2014   Lab Results  Component Value Date   HGBA1C 6.8* 08/28/2014    Diagnostic Studies/Procedures   Dg Chest 2 View 09/06/2014   CLINICAL DATA:  Pleural effusions, history diabetes mellitus without complication, hypertension, abdominal aortic aneurysm  EXAM: CHEST  2 VIEW  COMPARISON:  09/02/2014  FINDINGS: LEFT jugular central venous catheter with tip projecting over confluence of the SVC and LEFT brachiocephalic vein.  Enlargement of cardiac silhouette.  Atherosclerotic calcification aorta.  Mediastinal contours and pulmonary vascularity normal.  Slightly improved atelectasis versus consolidation in LEFT lower lobe.  Minimal RIGHT base atelectasis.  Upper lungs clear.  No pneumothorax.  Minimal LEFT pleural effusion.  IMPRESSION: Improving aeration LEFT lower lobe.  Enlargement f cardiac silhouette with minimal RIGHT base atelectasis.   Electronically Signed  By: Ulyses Southward M.D.   On: 09/06/2014 14:18   Ct Angio Chest W/cm &/or Wo Cm 08/28/2014   CLINICAL DATA:  Chest pain, back pain, history of abdominal aortic aneurysm, diabetes, hypertension  EXAM: CT ANGIOGRAPHY CHEST, ABDOMEN AND PELVIS  TECHNIQUE: Multidetector CT imaging through the chest, abdomen and pelvis was performed using the standard protocol during bolus administration of intravenous contrast. Multiplanar reconstructed images and MIPs were obtained and reviewed to evaluate the vascular anatomy.  CONTRAST:  80mL OMNIPAQUE IOHEXOL 350 MG/ML SOLN  COMPARISON:  06/22/2010  FINDINGS: CTA CHEST FINDINGS  Proximal descending thoracic aorta  demonstrates wall thickening with hyperattenuation on the noncontrast phase beginning just distal to the left subclavian artery and extending throughout the descending thoracic aorta diffusely. This appearance is consistent with descending thoracic aortic acute intramural hemorrhage. Postcontrast, there are 2 areas of wall enhancement within the thickened hyperdense wall, image 32 and 39 of series 5 suspicious for ongoing intramural hemorrhage versus areas of small ulcerated plaque formation. Also, there is a fusiform atherosclerotic descending thoracic aortic aneurysm measuring 5.1 x 5.2 cm, image 66 series 5. The transverse aorta and ascending aortic segments do not appear to be involved. Major branch vessels remain patent. No mediastinal hemorrhage or pericardial effusion. No pleural effusion. Negative for adenopathy. Mild enlargement of the inferior thyroid with scattered hypodense nodules.  Lung windows demonstrate respiratory motion artifact. Bibasilar minor atelectasis noted. Right middle lobe well-circumscribed 8 mm nodule again noted, stable compared to 06/22/2010 supporting a benign nodule. No focal pneumonia, collapse or consolidation. Negative for edema or pneumothorax. Trachea and central airways are patent. Coronary calcifications evident.  Review of the MIP images confirms the above findings.  CTA ABDOMEN AND PELVIS FINDINGS  Mild ectasia of the abdominal aorta diffusely with calcific atherosclerosis. Patient appears status post infrarenal aortic aneurysm repair. No significant recurrent abdominal aneurysm, hemorrhage, retroperitoneal abnormality. 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 This can be seen with median arcuate ligament syndrome. SMA and main renal arteries are patent. 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  Tortuous atherosclerotic iliac vessels. Common, internal and external iliac  arteries remain patent. No iliac aneurysm. Visualized common femoral, proximal profunda femoral and superficial femoral arteries are also patent.  Nonvascular: Stable small 12 mm hypodense cyst in the right hepatic dome. No biliary dilatation. Gallbladder, biliary system, pancreas, spleen, and adrenal glands are within normal limits for age and arterial phase imaging.  Kidneys demonstrate cortical thinning/ scarring but normal enhancement and excretion. Scattered small renal cysts noted as before. No significant interval change.  Negative for bowel obstruction, dilatation, ileus, or free air. Diffuse colonic diverticulosis noted.  No abdominal free fluid, fluid collection, hemorrhage, abscess, or adenopathy.  Within the pelvis, there is a chronic sigmoid diverticulosis. Small incidental 2 cm left ovarian cyst, image 243 series 5. Urinary bladder unremarkable. Rectum is stool-filled. No pelvic free fluid, fluid collection, hemorrhage, abscess, adenopathy, inguinal abnormality, or hernia. Prior hysterectomy noted.  Bones are osteopenic. Diffuse degenerative changes of the spine and associated scoliosis.  Review of the MIP images confirms the above findings.  IMPRESSION: Acute intramural hemorrhage of the descending thoracic aorta with an associated mid descending thoracic aortic 5 cm aneurysm.  Abdominal aortic atherosclerotic changes, status post AAA repair.  Stable 8 mm right middle lobe well-circumscribed nodule compared to 2011 compatible with a benign nodule.  Stable hepatic and renal cysts  Bibasilar atelectasis  Colonic  diverticulosis  No acute intra-abdominal or pelvic process  These results were called by telephone at the time of interpretation on 08/28/2014 at 2:23 pm to Dr. Jaci CarrelHRISTOPHER POLLINA , who verbally acknowledged these results.   Electronically Signed   By: Ruel Favorsrevor  Shick M.D.   On: 08/28/2014 14:28   Dg Esophagus 08/10/2014   CLINICAL DATA:  Dysphagia  EXAM: ESOPHOGRAM/BARIUM SWALLOW  TECHNIQUE:  Single contrast examination was performed using  thin barium.  FLUOROSCOPY TIME:  2 min 0 seconds  COMPARISON:  None.  FINDINGS: A single contrast barium swallow was performed. Rapid sequence spot films of the cervical esophagus show no significant abnormality. Very minimal indentation upon the right lateral aspect of the lower cervical esophagus could be due to prominent thyroid tissue. Moderate tertiary contractions are present in the mid and distal esophagus. No hiatal hernia is seen and no reflux is demonstrated. A barium pill was given at the end of the study, but the patient could not swallow the barium tablet.  IMPRESSION: 1. Moderate tertiary contractions in the mid and distal esophagus. 2. Slight indentation upon the lower cervical esophagus from the right of midline could be due to prominent thyroid tissue. Correlate clinically. 3. No hiatal hernia or reflux is demonstrated. The barium pill could not be swallowed by the patient.   Electronically Signed   By: Dwyane DeePaul  Barry M.D.   On: 08/10/2014 12:14   Nm Myocar Multi W/spect W/wall Motion / Ef 09/08/2014   CLINICAL DATA:  Chest pain  EXAM: MYOCARDIAL IMAGING WITH SPECT (REST AND PHARMACOLOGIC-STRESS)  GATED LEFT VENTRICULAR WALL MOTION STUDY  LEFT VENTRICULAR EJECTION FRACTION  TECHNIQUE: Standard myocardial SPECT imaging was performed after resting intravenous injection of 10 mCi Tc-3333m sestamibi. Subsequently, intravenous infusion of Lexiscan was performed under the supervision of the Cardiology staff. At peak effect of the drug, 30 mCi [id number] sestamibi was injected intravenously and standard myocardial SPECT imaging was performed. Quantitative gated imaging was also performed to evaluate left ventricular wall motion, and estimate left ventricular ejection fraction.  COMPARISON:  None.  FINDINGS: Baseline EKG:  NSR with RBBB  EKG during Lexiscan infusion: NSR with RBBB with no ST changes. The patient had chest pain during the infusion as well as SOB  RAW  images deomnstrated increased gut uptake below the diaphragm and mild diaphragmatic attenuation  Perfusion: On rest images there is a small in size, mild intensity defect in the apical inferoseptum . There is small in size, mild in intensity defect in the mid and basal inferolateral wall. Stress images show small in size and mild in intensity defects in the apical inferoseptum and mid and basal inferolateral wall. These defects appear fixed with no ischemia.  Wall Motion: Normal left ventricular wall motion. No left ventricular dilation.  Left Ventricular Ejection Fraction: 73 %  End diastolic volume 79 ml  End systolic volume 22 ml  IMPRESSION: 1. Small sized, mild in intensity fixed defects noted in the apical inferoseptum and mid and basal inferolateral walls consistent with diaphragmatic attenuation. No reversible ischemia or infarction.  2. Normal left ventricular wall motion.  3. Left ventricular ejection fraction 73%  4. Low-risk stress test findings.  *2012 Appropriate Use Criteria for Coronary Revascularization Focused Update: J Am Coll Cardiol. 2012;59(9):857-881. http://content.dementiazones.comonlinejacc.org/article.aspx?articleid=1201161   Electronically Signed   By: Armanda Magicraci  Turner   On: 09/08/2014 16:48   Dg Chest Port 1 View 09/02/2014   CLINICAL DATA:  Acute hypoxic respiratory failure, intramural aortic hematoma  EXAM: PORTABLE CHEST - 1  VIEW  COMPARISON:  09/01/2014  FINDINGS: Cardiac shadow remains mildly enlarged. A left jugular central line is again seen projecting over the left innominate vein. Right lung demonstrates improved aeration with only minimal residual atelectasis. The large left-sided pleural effusion has decreased in the interval from the prior exam. Persistent left lower lobe consolidation is noted.  IMPRESSION: Improvement in left pleural effusion. Persistent left lower lobe consolidation is noted.  Improved aeration in the right lung base.   Electronically Signed   By: Alcide Clever M.D.   On:  09/02/2014 08:09   Dg Chest Port 1 View 09/01/2014   CLINICAL DATA:  New central line placement.  Hypoxia.  EXAM: PORTABLE CHEST - 1 VIEW  COMPARISON:  Multiple exams, including 09/01/2014 CT scan  FINDINGS: The left IJ catheter projects over the aorta but based on its curvature high finding more likely to be in the brachiocephalic vein. A lateral projection or CT chest could confirm this. No pneumothorax.  Large left pleural effusion with passive atelectasis in the left hemithorax. The patient has a known thoracic aortic aneurysm. Bandlike opacity at the right lung base.  IMPRESSION: 1. Left IJ line tip is strongly prefer to be in the brachiocephalic vein. 2. Large left pleural effusion with considerable passive atelectasis. Bandlike atelectasis at the right lung base. No pneumothorax observed.   Electronically Signed   By: Herbie Baltimore M.D.   On: 09/01/2014 17:01   Dg Chest Port 1 View 08/28/2014   CLINICAL DATA:  Chest pain  EXAM: PORTABLE CHEST - 1 VIEW  COMPARISON:  07/31/2010  FINDINGS: Mild cardiomegaly with central vascular congestion. Tortuous atherosclerotic descending thoracic aorta. Chronic medial left lower lobe atelectasis along the aortic contour. No definite focal pneumonia, collapse or consolidation. No edema, effusion or pneumothorax. Trachea midline.  IMPRESSION: Cardiomegaly with vascular congestion  Chronic left lower lobe atelectasis  Stable exam   Electronically Signed   By: Ruel Favors M.D.   On: 08/28/2014 09:53   Ct Angio Chest Aortic Dissect W &/or W/o 09/01/2014   CLINICAL DATA:  Intramural hematoma, thoracic aortic aneurysm  EXAM: CT ANGIOGRAPHY CHEST WITH CONTRAST  TECHNIQUE: Multidetector CT imaging of the chest was performed using the standard protocol during bolus administration of intravenous contrast. Multiplanar CT image reconstructions and MIPs were obtained to evaluate the vascular anatomy.  CONTRAST:  80mL OMNIPAQUE IOHEXOL 350 MG/ML SOLN  COMPARISON:  08/28/2014   FINDINGS: Contrast reflux from the right atrium into hepatic veins. There good contrast opacification of pulmonary artery branches with no filling defects to suggest acute PE. There is compression of the superior left pulmonary vein by at the descending thoracic aortic aneurysm. Mild plaque in the ascending aorta. Classic 3 vessel brachiocephalic arterial origin anatomy without proximal stenosis.  Hyperdense intramural hematoma in the distal arch beyond the subclavian origin extending into the distal descending thoracic segment. There is scattered plaque and displaced intimal calcifications. Fusiform aneurysmal dilatation of the mid descending thoracic aorta, 5.1 x 5.2 cm maximum transverse dimensions, stable since previous study, tapering to a diameter of 3 cm, tortuous in its distal descending segment above the diaphragm. The intramural hematoma does not extend below the diaphragm. The visualized proximal abdominal aorta shows moderate atheromatous change without dissection, aneurysm, or stenosis. Origin stenosis of the celiac axis with distal aneurysmal dilatation to 17 mm.  New bilateral pleural effusions left greater than right. No pericardial effusion. Mild four-chamber cardiac enlargement. No hilar or mediastinal adenopathy identified. There is consolidation/ atelectasis in the  posterior left lower lobe. Dependent atelectasis in the right lower lobe and left upper lobe. 11mm nodule, right middle lobe image 65/6, previously 8 mm on 06/22/2010.  Review of the MIP images confirms the above findings.  IMPRESSION: 1. Stable appearance of intramural hemorrhage in the descending thoracic aorta and 5.2 cm mid descending fusiform aneurysm. This results in compression of the adjacent superior left pulmonary vein. 2. New pleural effusions left greater than right, with consolidation/atelectasis in the posterior left lower lobe 3. 11 mm right lower lobe pulmonary nodule, previously 8 mm on 06/22/2010. Follow-up  recommended to exclude low-grade neoplasm.   Electronically Signed   By: Oley Balm M.D.   On: 09/01/2014 13:26   Ct Angio Abd/pel W/ And/or W/o 08/28/2014   CLINICAL DATA:  Chest pain, back pain, history of abdominal aortic aneurysm, diabetes, hypertension  EXAM: CT ANGIOGRAPHY CHEST, ABDOMEN AND PELVIS  TECHNIQUE: Multidetector CT imaging through the chest, abdomen and pelvis was performed using the standard protocol during bolus administration of intravenous contrast. Multiplanar reconstructed images and MIPs were obtained and reviewed to evaluate the vascular anatomy.  CONTRAST:  80mL OMNIPAQUE IOHEXOL 350 MG/ML SOLN  COMPARISON:  06/22/2010  FINDINGS: CTA CHEST FINDINGS  Proximal descending thoracic aorta demonstrates wall thickening with hyperattenuation on the noncontrast phase beginning just distal to the left subclavian artery and extending throughout the descending thoracic aorta diffusely. This appearance is consistent with descending thoracic aortic acute intramural hemorrhage. Postcontrast, there are 2 areas of wall enhancement within the thickened hyperdense wall, image 32 and 39 of series 5 suspicious for ongoing intramural hemorrhage versus areas of small ulcerated plaque formation. Also, there is a fusiform atherosclerotic descending thoracic aortic aneurysm measuring 5.1 x 5.2 cm, image 66 series 5. The transverse aorta and ascending aortic segments do not appear to be involved. Major branch vessels remain patent. No mediastinal hemorrhage or pericardial effusion. No pleural effusion. Negative for adenopathy. Mild enlargement of the inferior thyroid with scattered hypodense nodules.  Lung windows demonstrate respiratory motion artifact. Bibasilar minor atelectasis noted. Right middle lobe well-circumscribed 8 mm nodule again noted, stable compared to 06/22/2010 supporting a benign nodule. No focal pneumonia, collapse or consolidation. Negative for edema or pneumothorax. Trachea and central  airways are patent. Coronary calcifications evident.  Review of the MIP images confirms the above findings.  CTA ABDOMEN AND PELVIS FINDINGS  Mild ectasia of the abdominal aorta diffusely with calcific atherosclerosis. Patient appears status post infrarenal aortic aneurysm repair. No significant recurrent abdominal aneurysm, hemorrhage, retroperitoneal abnormality. 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 This can be seen with median arcuate ligament syndrome. SMA and main renal arteries are patent. 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  Tortuous atherosclerotic iliac vessels. Common, internal and external iliac arteries remain patent. No iliac aneurysm. Visualized common femoral, proximal profunda femoral and superficial femoral arteries are also patent.  Nonvascular: Stable small 12 mm hypodense cyst in the right hepatic dome. No biliary dilatation. Gallbladder, biliary system, pancreas, spleen, and adrenal glands are within normal limits for age and arterial phase imaging.  Kidneys demonstrate cortical thinning/ scarring but normal enhancement and excretion. Scattered small renal cysts noted as before. No significant interval change.  Negative for bowel obstruction, dilatation, ileus, or free air. Diffuse colonic diverticulosis noted.  No abdominal free fluid, fluid collection, hemorrhage, abscess, or adenopathy.  Within the pelvis, there is a chronic sigmoid diverticulosis. Small incidental 2 cm left  ovarian cyst, image 243 series 5. Urinary bladder unremarkable. Rectum is stool-filled. No pelvic free fluid, fluid collection, hemorrhage, abscess, adenopathy, inguinal abnormality, or hernia. Prior hysterectomy noted.  Bones are osteopenic. Diffuse degenerative changes of the spine and associated scoliosis.  Review of the MIP images confirms the above findings.  IMPRESSION: Acute intramural hemorrhage of  the descending thoracic aorta with an associated mid descending thoracic aortic 5 cm aneurysm.  Abdominal aortic atherosclerotic changes, status post AAA repair.  Stable 8 mm right middle lobe well-circumscribed nodule compared to 2011 compatible with a benign nodule.  Stable hepatic and renal cysts  Bibasilar atelectasis  Colonic diverticulosis  No acute intra-abdominal or pelvic process  These results were called by telephone at the time of interpretation on 08/28/2014 at 2:23 pm to Dr. Jaci CarrelHRISTOPHER POLLINA , who verbally acknowledged these results.   Electronically Signed   By: Ruel Favorsrevor  Shick M.D.   On: 08/28/2014 14:28    Discharge Medications     Medication List    STOP taking these medications       aspirin EC 81 MG tablet      TAKE these medications       atorvastatin 20 MG tablet  Commonly known as:  LIPITOR  Take 1 tablet (20 mg total) by mouth daily at 6 PM.     bisacodyl 5 MG EC tablet  Commonly known as:  DULCOLAX  Take 2 tablets (10 mg total) by mouth daily as needed for mild constipation or moderate constipation.     calcium carbonate 600 MG Tabs tablet  Commonly known as:  OS-CAL  Take 600 mg by mouth 2 (two) times daily with a meal.     DSS 100 MG Caps  Take 100 mg by mouth daily.     felodipine 10 MG 24 hr tablet  Commonly known as:  PLENDIL  Take 10 mg by mouth daily.     gemfibrozil 600 MG tablet  Commonly known as:  LOPID  Take 600 mg by mouth 2 (two) times daily before a meal.     HYDROcodone-acetaminophen 5-325 MG per tablet  Commonly known as:  NORCO/VICODIN  Take 1 tablet by mouth every 6 (six) hours as needed for moderate pain.     labetalol 200 MG tablet  Commonly known as:  NORMODYNE  Take 2 tablets (400 mg total) by mouth 3 (three) times daily.     metFORMIN 500 MG tablet  Commonly known as:  GLUCOPHAGE  Take 500 mg by mouth 2 (two) times daily with a meal.     SYSTANE OP  Place 1 drop into both eyes 2 (two) times daily.     Vitamin D  (Ergocalciferol) 50000 UNITS Caps capsule  Commonly known as:  DRISDOL  Take 50,000 Units by mouth every 7 (seven) days. Every saturday        Disposition   The patient will be discharged in stable condition to SNF.  Discharge Instructions   (HEART FAILURE PATIENTS) Call MD:  Anytime you have any of the following symptoms: 1) 3 pound weight gain in 24 hours or 5 pounds in 1 week 2) shortness of breath, with or without a dry hacking cough 3) swelling in the hands, feet or stomach 4) if you have to sleep on extra pillows at night in order to breathe.    Complete by:  As directed      Diet - low sodium heart healthy    Complete by:  As directed  Increase activity slowly    Complete by:  As directed           Follow-up Information   Follow up with Corky Crafts., MD. (The office will call.)    Specialty:  Interventional Cardiology   Contact information:   [address] [place] [address] [phone]       Follow up with Darnelle Bos, MD. (As needed)    Specialty:  Internal Medicine   Contact information:   [address], [address] [place] [address] [phone]         Duration of Discharge Encounter: Greater than 30 minutes including physician and PA time.  Signed, Bjorn Loser Savio Albrecht PA-C 09/12/2014, 2:57 PM

## 2014-09-09 NOTE — Progress Notes (Signed)
Physical Therapy Treatment Patient Details Name: Gershon MusselJANIE B Blasingame MRN: [id number] DOB: 07/22/1925 Today's Date: 09/09/2014    History of Present Illness Pt is an 80 yo female who presented to ED 9/27 with chest pain. She was admitted with suspicion for aortic aneurysm vs PE. A CT of the chest showed acute intramural hemorrhage of the descending thoracic aorta.     PT Comments    Pt making good progress. Pt doesn't feel ready to go home but refused any suggestion of ST-SNF.  Follow Up Recommendations  Home health PT;Supervision/Assistance - 24 hour     Equipment Recommendations  3in1 (PT) (Pt states her husband has a RW she can use)    Recommendations for Other Services       Precautions / Restrictions Precautions Precautions: Fall    Mobility  Bed Mobility Overal bed mobility: Modified Independent Bed Mobility: Supine to Sit     Supine to sit: Modified independent (Device/Increase time)        Transfers Overall transfer level: Needs assistance Equipment used: Rolling walker (2 wheeled) Transfers: Sit to/from Stand Sit to Stand: Supervision            Ambulation/Gait Ambulation/Gait assistance: Min guard Ambulation Distance (Feet): 150 Feet Assistive device: Rolling walker (2 wheeled) Gait Pattern/deviations: Step-through pattern;Decreased stride length;Trunk flexed     General Gait Details: Verbal cues to stay closer to walker and to stand more erect.   Stairs            Wheelchair Mobility    Modified Rankin (Stroke Patients Only)       Balance   Sitting-balance support: No upper extremity supported;Feet supported Sitting balance-Leahy Scale: Good       Standing balance-Leahy Scale: Fair                      Cognition Arousal/Alertness: Awake/alert Behavior During Therapy: WFL for tasks assessed/performed Overall Cognitive Status: Within Functional Limits for tasks assessed                      Exercises       General Comments        Pertinent Vitals/Pain Pain Assessment: No/denies pain    Home Living                      Prior Function            PT Goals (current goals can now be found in the care plan section) Progress towards PT goals: Progressing toward goals    Frequency       PT Plan Current plan remains appropriate    Co-evaluation             End of Session Equipment Utilized During Treatment: Gait belt Activity Tolerance: Patient tolerated treatment well Patient left: in chair;with call bell/phone within reach     Time: 1122-1132 PT Time Calculation (min): 10 min  Charges:  $Gait Training: 8-22 mins                    G Codes:      Kenan Moodie 09/09/2014, 1:44 PM  [employer]ary Marguita Venning PT [phone]

## 2014-09-10 DIAGNOSIS — I5033 Acute on chronic diastolic (congestive) heart failure: Secondary | ICD-10-CM

## 2014-09-10 LAB — BASIC METABOLIC PANEL
ANION GAP: 15 (ref 5–15)
BUN: 32 mg/dL — ABNORMAL HIGH (ref 6–23)
CO2: 22 mEq/L (ref 19–32)
Calcium: 9.9 mg/dL (ref 8.4–10.5)
Chloride: 99 mEq/L (ref 96–112)
Creatinine, Ser: 1.05 mg/dL (ref 0.50–1.10)
GFR calc non Af Amer: 46 mL/min — ABNORMAL LOW (ref >90)
GFR, EST AFRICAN AMERICAN: 54 mL/min — AB (ref >90)
Glucose, Bld: 137 mg/dL — ABNORMAL HIGH (ref 70–99)
POTASSIUM: 5.5 meq/L — AB (ref 3.7–5.3)
Sodium: 136 mEq/L — ABNORMAL LOW (ref 137–147)

## 2014-09-10 LAB — GLUCOSE, CAPILLARY
GLUCOSE-CAPILLARY: 121 mg/dL — AB (ref 70–99)
GLUCOSE-CAPILLARY: 115 mg/dL — AB (ref 70–99)
Glucose-Capillary: 104 mg/dL — ABNORMAL HIGH (ref 70–99)
Glucose-Capillary: 178 mg/dL — ABNORMAL HIGH (ref 70–99)

## 2014-09-10 NOTE — Progress Notes (Signed)
    Subjective:   Plan was for possible d/c yesterday but continued to have "knawing pain" in her left chest. Pain meds increased.   K 5.5 on BMET today but with significant hemolysis.   Objective:  Vital Signs in the last 24 hours: Temp:  [97.9 F (36.6 C)-98.3 F (36.8 C)] 98.2 F (36.8 C) (10/10 0511) Pulse Rate:  [65-67] 65 (10/10 0511) Resp:  [18] 18 (10/10 0511) BP: (109-138)/(46-67) 138/67 mmHg (10/10 0511) SpO2:  [90 %-93 %] 93 % (10/10 0511) Weight:  [65.046 kg (143 lb 6.4 oz)] 65.046 kg (143 lb 6.4 oz) (10/10 0511)  Intake/Output from previous day:    Physical Exam: Pt is alert and oriented, pleasant elderly woman in NAD HEENT: normal Neck: JVP - normal Dressing on L Lungs: CTA bilaterally CV: RRR without murmur or gallop Abd: soft, NT, Positive BS, no hepatomegaly Ext: no C/C/E, distal pulses intact and equal Skin: warm/dry no rash   Lab Results: No results found for this basename: WBC, HGB, PLT,  in the last 72 hours  Recent Labs  09/10/14 0238  NA 136*  K 5.5*  CL 99  CO2 22  GLUCOSE 137*  BUN 32*  CREATININE 1.05   No results found for this basename: TROPONINI, CK, MB,  in the last 72 hours  Cardiac Studies: Myoview: IMPRESSION:  1. Small sized, mild in intensity fixed defects noted in the apical  inferoseptum and mid and basal inferolateral walls consistent with  diaphragmatic attenuation. No reversible ischemia or infarction.  2. Normal left ventricular wall motion.  3. Left ventricular ejection fraction 73%  4. Low-risk stress test findings.   Tele: Personally reviewed. Normal sinus rhythm. No significant arrhythmia.  Assessment/Plan:  1. Acute intramural hematoma of the descending thoracic aorta. Plans per Dr. Laneta SimmersBartle for stent grafting in approximately 1 month. Stability demonstrated on serial CTA. Continue blood pressure management and as needed analgesics.  2. Acute diastolic heart failure. LVEF preserved by echo at 60-65%. Blood  pressure is controlled on current therapy. No signs of volume overload on exam today.  3. Chronic kidney disease, stage III. Creatinine essentially stable with furosemide on hold. K up today but likely due to hemolysis.  4. Coronary artery disease, diagnosis based on coronary calcification by chest CT. Stress Myoview result reviewed as above without ischemia. Continue medical therapy. I agree with Dr. Excell Seltzerooper and I do not think her chest pain is anginal in nature.  5. Generalized weakness. Continue with physical therapy and mobilization. PT recommends: Home health PT;Supervision/Assistance - 24 hour.    I had talk with patient and her daughter in [place] who is an NP. I stressed that I really think she needs short stay at SNF and daughter agrees. Will reach out to SW and see if we can arrange for Monday. Repeat BMET in am. Suspect hyperkalemia is artifactual due to hemolysis.   Daughter is flying in from [place] on Nov 5 -17 and would like to know if stent graing could be done at that time.  Daniel Bensimhon,MD 11:49 AM

## 2014-09-10 NOTE — Progress Notes (Signed)
Patient declined to walk this morning and stated that she was just too tired to walk. Will check again after lunch. Jennifer Matteina Yohana Bartha, RN

## 2014-09-11 LAB — BASIC METABOLIC PANEL
ANION GAP: 14 (ref 5–15)
BUN: 27 mg/dL — ABNORMAL HIGH (ref 6–23)
CALCIUM: 10.2 mg/dL (ref 8.4–10.5)
CO2: 22 mEq/L (ref 19–32)
CREATININE: 1.11 mg/dL — AB (ref 0.50–1.10)
Chloride: 99 mEq/L (ref 96–112)
GFR calc Af Amer: 50 mL/min — ABNORMAL LOW (ref >90)
GFR calc non Af Amer: 43 mL/min — ABNORMAL LOW (ref >90)
GLUCOSE: 180 mg/dL — AB (ref 70–99)
Potassium: 4.2 mEq/L (ref 3.7–5.3)
Sodium: 135 mEq/L — ABNORMAL LOW (ref 137–147)

## 2014-09-11 LAB — GLUCOSE, CAPILLARY
Glucose-Capillary: 130 mg/dL — ABNORMAL HIGH (ref 70–99)
Glucose-Capillary: 101 mg/dL — ABNORMAL HIGH (ref 70–99)
Glucose-Capillary: 118 mg/dL — ABNORMAL HIGH (ref 70–99)
Glucose-Capillary: 103 mg/dL — ABNORMAL HIGH (ref 70–99)

## 2014-09-11 MED ORDER — DOCUSATE SODIUM 100 MG PO CAPS
100.0000 mg | ORAL_CAPSULE | Freq: Every day | ORAL | Status: DC
  Administered 2014-09-11 – 2014-09-12 (×2): 100 mg via ORAL
  Filled 2014-09-11 (×2): qty 1

## 2014-09-11 NOTE — Clinical Social Work Placement (Signed)
Clinical Social Work Department CLINICAL SOCIAL WORK PLACEMENT NOTE 09/11/2014  Patient:  Jennifer [name],Jennifer [name]  Account Number:  [id number] Admit date:  08/28/2014  Clinical Social Worker:  Seward SpeckLEO [place], [place]  Date/time:  09/11/2014 12:30 PM  Clinical Social Work is seeking post-discharge placement for this patient at the following level of care:   SKILLED NURSING   (*CSW will update this form in Epic as items are completed)   09/11/2014  Patient/family provided with [facility] Department of Clinical Social Work's list of facilities offering this level of care within the geographic area requested by the patient (or if unable, by the patient's family).  09/11/2014  Patient/family informed of their freedom to choose among providers that offer the needed level of care, that participate in Medicare, Medicaid or managed care program needed by the patient, have an available bed and are willing to accept the patient.  09/11/2014  Patient/family informed of [facility]' ownership interest in [facility], as well as of the fact that they are under no obligation to receive care at this facility.  PASARR submitted to EDS on 09/11/2014 PASARR number received on 09/11/2014  FL2 transmitted to all facilities in geographic area requested by pt/family on  09/11/2014 FL2 transmitted to all facilities within larger geographic area on 09/11/2014  Patient informed that his/her managed care company has contracts with or will negotiate with  certain facilities, including the following:     Patient/family informed of bed offers received:   Patient chooses bed at  Physician recommends and patient chooses bed at    Patient to be transferred to  on   Patient to be transferred to facility by  Patient and family notified of transfer on  Name of family member notified:    The following physician request were entered in Epic:   Additional Comments:

## 2014-09-11 NOTE — Clinical Social Work Psychosocial (Signed)
Clinical Social Work Department BRIEF PSYCHOSOCIAL ASSESSMENT 09/11/2014  Patient:  Jennifer [name], Jennifer [name]     Account Number:  [id number]     Admit date:  08/28/2014  Clinical Social Worker:  Thalia Party  Date/Time:  09/11/2014 12:23 PM  Referred by:  CSW  Date Referred:  09/09/2014 Referred for  SNF Placement   Other Referral:   Interview type:  Patient Other interview type:    PSYCHOSOCIAL DATA Living Status:  HUSBAND Admitted from facility:   Level of care:   Primary support name:  Derrell Lolling Primary support relationship to patient:  SPOUSE Degree of support available:   Patient has high support from her spouse and daughter.    CURRENT CONCERNS  Other Concerns:    SOCIAL WORK ASSESSMENT / PLAN CSW met with this 79 y/o, married, Caucasian, female to discuss SNF placement.  Patient presents in hospital garb, normal affect, mood congruent, hearing impaired, speech normal, thought process normal.  Patient states she is ready to go and verbalizes agreement and understanding for SNF placement.  Patient is oriented x4.   CSW explained the process for applying and placing in a SNF and let her know that bed offers would be available by tomorrow AM, patient stated that she would not see her husband again until 5PM and that she would want to discuss any bed offers with him. She plans to return to her home upon discharge of the SNF.   Assessment/plan status:   Other assessment/ plan:   Information/referral to community resources:    PATIENT'S/FAMILY'S RESPONSE TO PLAN OF CARE: Patient states she understands the need for short-term rehab placement and just wants to be at a facility near [facility] that is nice.   [facility] Arwyn Besaw Richardo Priest ED CSW [phone]

## 2014-09-11 NOTE — Progress Notes (Signed)
    Subjective:   Feels ok. Weak. Now agreeable to SNF placement. Repeat BMET pending.  Objective:  Vital Signs in the last 24 hours: Temp:  [98.3 F (36.8 C)-98.5 F (36.9 C)] 98.5 F (36.9 C) (10/11 0431) Pulse Rate:  [59-66] 63 (10/11 0431) Resp:  [17-18] 18 (10/11 0431) BP: (101-131)/(46-65) 131/49 mmHg (10/11 0431) SpO2:  [92 %-95 %] 95 % (10/11 0431) Weight:  [66.5 kg (146 lb 9.7 oz)] 66.5 kg (146 lb 9.7 oz) (10/11 0431)  Intake/Output from previous day: 10/10 0701 - 10/11 0700 In: 720 [P.O.:720] Out: -   Physical Exam: Pt is alert and oriented, pleasant elderly woman in NAD HEENT: normal Neck: JVP - normal Dressing on L Lungs: CTA bilaterally CV: RRR without murmur or gallop Abd: soft, NT, Positive BS, no hepatomegaly Ext: no C/C/E, distal pulses intact and equal Skin: warm/dry no rash   Lab Results: No results found for this basename: WBC, HGB, PLT,  in the last 72 hours  Recent Labs  09/10/14 0238  NA 136*  K 5.5*  CL 99  CO2 22  GLUCOSE 137*  BUN 32*  CREATININE 1.05   No results found for this basename: TROPONINI, CK, MB,  in the last 72 hours  Cardiac Studies: Myoview: IMPRESSION:  1. Small sized, mild in intensity fixed defects noted in the apical  inferoseptum and mid and basal inferolateral walls consistent with  diaphragmatic attenuation. No reversible ischemia or infarction.  2. Normal left ventricular wall motion.  3. Left ventricular ejection fraction 73%  4. Low-risk stress test findings.   Tele: Personally reviewed. Normal sinus rhythm. No significant arrhythmia.  Assessment/Plan:  1. Acute intramural hematoma of the descending thoracic aorta. Plans per Dr. Laneta SimmersBartle for stent grafting in approximately 1 month. Stability demonstrated on serial CTA. Continue blood pressure management and as needed analgesics.  2. Acute diastolic heart failure. LVEF preserved by echo at 60-65%. Blood pressure is controlled on current therapy. No signs  of volume overload on exam today.  3. Chronic kidney disease, stage III. Creatinine essentially stable with furosemide on hold. K up yesterday but likely due to hemolysis. Repeat pending.   4. Coronary artery disease, diagnosis based on coronary calcification by chest CT. Stress Myoview result reviewed as above without ischemia. Continue medical therapy. I agree with Dr. Excell Seltzerooper and I do not think her chest pain is anginal in nature. Improved today.   5. Generalized weakness. Continue with physical therapy and mobilization. PT recommends: Home health PT;Supervision/Assistance - 24 hour. Now agrees to SNF. SW aware.   Daughter is flying in from [place] on Nov 5 -17 and would like to know if stent graing could be done at that time.  Hopefully to SNF tomorrow.  Truman Haywardaniel Bensimhon,MD 8:32 AM

## 2014-09-12 DIAGNOSIS — R5381 Other malaise: Secondary | ICD-10-CM | POA: Diagnosis not present

## 2014-09-12 DIAGNOSIS — N183 Chronic kidney disease, stage 3 (moderate): Secondary | ICD-10-CM

## 2014-09-12 DIAGNOSIS — R079 Chest pain, unspecified: Secondary | ICD-10-CM

## 2014-09-12 LAB — GLUCOSE, CAPILLARY
Glucose-Capillary: 118 mg/dL — ABNORMAL HIGH (ref 70–99)
Glucose-Capillary: 104 mg/dL — ABNORMAL HIGH (ref 70–99)

## 2014-09-12 MED ORDER — HYDROCODONE-ACETAMINOPHEN 5-325 MG PO TABS: 1.0000 | ORAL_TABLET | Freq: Four times a day (QID) | ORAL | Status: DC | PRN

## 2014-09-12 MED ORDER — BISACODYL 5 MG PO TBEC: 10.0000 mg | DELAYED_RELEASE_TABLET | Freq: Every day | ORAL | Status: DC | PRN

## 2014-09-12 MED ORDER — DSS 100 MG PO CAPS: 100.0000 mg | ORAL_CAPSULE | Freq: Every day | ORAL | Status: DC

## 2014-09-12 MED ORDER — ATORVASTATIN CALCIUM 20 MG PO TABS: 20.0000 mg | ORAL_TABLET | Freq: Every day | ORAL | Status: DC

## 2014-09-12 MED ORDER — LABETALOL HCL 200 MG PO TABS: 400.0000 mg | ORAL_TABLET | Freq: Three times a day (TID) | ORAL | Status: DC

## 2014-09-12 NOTE — Progress Notes (Signed)
Pt being discharged to SNF. [facility] report, no further questions asked. Pt and her husband were given instruction to [facility] once they arrive so they can be assisted. Pt's [facility]. Will continue to monitor until pt ;eaves the floor.    Jennifer [name] M

## 2014-09-12 NOTE — Clinical Social Work Note (Signed)
[facility] to [facility] today [facility].  Patient and husband Siri ColeHerbert agreeable to plans will transport via personal car and will contact [facility] upon arrival.  Patient and husband grateful for discharging, and stated they are looking forward to patient getting rehab in order to return back home.  RN to call report. Windell MouldingEric Taos Tapp, MSW, Theresia MajorsLCSWA [phone]

## 2014-09-12 NOTE — Progress Notes (Signed)
Physical Therapy Treatment Patient Details Name: Jennifer [name] MRN: [id number] DOB: 12/12/1924 Today's Date: 09/12/2014    History of Present Illness Pt is an 80 yo female who presented to ED 9/27 with chest pain. She was admitted with suspicion for aortic aneurysm vs PE. A CT of the chest showed acute intramural hemorrhage of the descending thoracic aorta.     PT Comments    Pt moving well although initially hesitant to mobilize due to pain but reports no pain with ambulation and decreased pain after activity. Pt educated for use of DME, HEP and gait with encouragement to increase mobility. Pt stating she now desires SNF and would benefit from additional therapy to return pt to mod I level prior to home.   Follow Up Recommendations  SNF;Supervision for mobility/OOB     Equipment Recommendations  3in1 (PT);Rolling walker with 5" wheels    Recommendations for Other Services       Precautions / Restrictions Precautions Precautions: Fall    Mobility  Bed Mobility               General bed mobility comments: in recliner on arrival  Transfers     Transfers: Sit to/from Stand Sit to Stand: Supervision         General transfer comment: cues for hand placement, scooting to edge and anterior translation  Ambulation/Gait Ambulation/Gait assistance: Min guard Ambulation Distance (Feet): 275 Feet Assistive device: Rolling walker (2 wheeled) Gait Pattern/deviations: Step-through pattern;Decreased stride length;Trunk flexed   Gait velocity interpretation: Below normal speed for age/gender General Gait Details: cues for posture and position in RW   Stairs            Wheelchair Mobility    Modified Rankin (Stroke Patients Only)       Balance     Sitting balance-Leahy Scale: Good       Standing balance-Leahy Scale: Fair                      Cognition Arousal/Alertness: Awake/alert Behavior During Therapy: WFL for tasks  assessed/performed Overall Cognitive Status: Within Functional Limits for tasks assessed                      Exercises General Exercises - Lower Extremity Long Arc Quad: AROM;Seated;Both;20 reps Hip ABduction/ADduction: AROM;Seated;Both;20 reps Hip Flexion/Marching: AROM;Seated;Both;20 reps Toe Raises: AROM;Seated;Both;20 reps Heel Raises: AROM;Seated;Both;20 reps    General Comments        Pertinent Vitals/Pain Pain Score: 7  Pain Location: abdomen Pain Descriptors / Indicators: Aching Pain Intervention(s): Premedicated before session;Repositioned    Home Living                      Prior Function            PT Goals (current goals can now be found in the care plan section) Progress towards PT goals: Progressing toward goals    Frequency  Min 3X/week    PT Plan Discharge plan needs to be updated    Co-evaluation             End of Session   Activity Tolerance: Patient tolerated treatment well Patient left: in chair;with call bell/phone within reach;with family/visitor present     Time: 7829-56211050-1114 PT Time Calculation (min): 24 min  Charges:  $Gait Training: 8-22 mins $Therapeutic Exercise: 8-22 mins  G CodesToney Sang:      Tabor, Marke Goodwyn Beth 09/12/2014, 11:18 AM Delaney MeigsMaija Tabor Laddie Math, PT [phone]

## 2014-09-12 NOTE — Clinical Social Work Note (Signed)
Patient and husband Siri ColeHerbert were given bed offers for SNF.  Pt and husband [facility], contacted [facility] to confirm bed availability spoke to [employer] Company[employer]aron Lowa, who said they have a bed available, and to contact her again, once authorization and discharge summary has been received.  Clinicals [facility] on Sunday October 11th.  [employer]ed insurance company on October 12, spoke to [place] who said the case worker is Deanna ArtisKeisha, and case is pending.  Marcelino DusterMichelle stated she sent high importance email to case worker to confirm authorization, awaiting call back.  Patient to be discharged later today once orders have been received from doctor.  Ervin KnackEric R. Rosmarie Esquibel, MSW, Theresia MajorsLCSWA [phone] 09/12/2014 12:05 PM

## 2014-09-12 NOTE — Discharge Instructions (Signed)
Daily weights. BMET in 1 week, results to Dr. Abe PeopleVaransi

## 2014-09-12 NOTE — Progress Notes (Signed)
    Subjective:  Left flank/ axillary pain, non cardiac, constant, asked about this.  Now agreeable to SNF placement.   Objective:  Vital Signs in the last 24 hours: Temp:  [98 F (36.7 C)-98.3 F (36.8 C)] 98 F (36.7 C) (10/12 0403) Pulse Rate:  [66-71] 71 (10/12 0754) Resp:  [17-20] 20 (10/12 0403) BP: (118-156)/(50-80) 144/66 mmHg (10/12 0754) SpO2:  [92 %-99 %] 99 % (10/12 0403) Weight:  [146 lb 3.2 oz (66.316 kg)] 146 lb 3.2 oz (66.316 kg) (10/12 0403)  Intake/Output from previous day: 10/11 0701 - 10/12 0700 In: 720 [P.O.:720] Out: -   Physical Exam: Pt is alert and oriented, pleasant elderly woman in NAD HEENT: normal Neck: JVP - normal Dressing on L Lungs: CTA bilaterally CV: RRR without murmur or gallop Abd: soft, NT, Positive BS, no hepatomegaly Ext: no C/C/E, distal pulses intact and equal Skin: warm/dry no rash   Lab Results:   Recent Labs  09/10/14 0238 09/11/14 0855  NA 136* 135*  K 5.5* 4.2  CL 99 99  CO2 22 22  GLUCOSE 137* 180*  BUN 32* 27*  CREATININE 1.05 1.11*    Cardiac Studies: Myoview: IMPRESSION:  1. Small sized, mild in intensity fixed defects noted in the apical  inferoseptum and mid and basal inferolateral walls consistent with  diaphragmatic attenuation. No reversible ischemia or infarction.  2. Normal left ventricular wall motion.  3. Left ventricular ejection fraction 73%  4. Low-risk stress test findings.   Tele: Personally reviewed. Normal sinus rhythm. No significant arrhythmia.  Assessment/Plan:   1. Acute intramural hematoma of the descending thoracic aorta. Plans per Dr. Laneta SimmersBartle for stent grafting in approximately 1 month. Stability demonstrated on serial CTA. Continue blood pressure management and as needed analgesics.  2. Acute diastolic heart failure. LVEF preserved by echo at 60-65%. Blood pressure is controlled on current therapy. No signs of volume overload.  3. Chronic kidney disease, stage III. Creatinine  essentially stable with furosemide on hold.   4. Coronary artery disease, diagnosis based on coronary calcification by chest CT. Stress Myoview result reviewed as above without ischemia. Continue medical therapy. I also agree with Dr. Excell Seltzerooper and I do not think her chest pain is anginal in nature.  5. Generalized weakness. Continue with physical therapy and mobilization. PT recommends: Home health PT;Supervision/Assistance - 24 hour. Now agrees to SNF. SW aware.   Daughter is [place] in from [place] on Nov 5 -17 and would like to know if stent graing could be done at that time.  Hopefully to SNF today.  Marcia Lepera,MD 8:08 AM

## 2014-09-13 ENCOUNTER — Non-Acute Institutional Stay (SKILLED_NURSING_FACILITY): Payer: Medicare Other | Admitting: Internal Medicine

## 2014-09-13 DIAGNOSIS — I150 Renovascular hypertension: Secondary | ICD-10-CM

## 2014-09-13 DIAGNOSIS — I509 Heart failure, unspecified: Secondary | ICD-10-CM | POA: Insufficient documentation

## 2014-09-13 DIAGNOSIS — E1122 Type 2 diabetes mellitus with diabetic chronic kidney disease: Secondary | ICD-10-CM

## 2014-09-13 DIAGNOSIS — I712 Thoracic aortic aneurysm, without rupture: Secondary | ICD-10-CM

## 2014-09-13 DIAGNOSIS — N189 Chronic kidney disease, unspecified: Secondary | ICD-10-CM

## 2014-09-13 DIAGNOSIS — I5032 Chronic diastolic (congestive) heart failure: Secondary | ICD-10-CM

## 2014-09-13 DIAGNOSIS — E119 Type 2 diabetes mellitus without complications: Secondary | ICD-10-CM | POA: Insufficient documentation

## 2014-09-13 DIAGNOSIS — I7123 Aneurysm of the descending thoracic aorta, without rupture: Secondary | ICD-10-CM

## 2014-09-13 DIAGNOSIS — I10 Essential (primary) hypertension: Secondary | ICD-10-CM | POA: Insufficient documentation

## 2014-09-13 NOTE — Progress Notes (Signed)
HISTORY & PHYSICAL  DATE: 09/13/2014   FACILITY: [facility]  LEVEL OF CARE: SNF (31)  ALLERGIES:  No Known Allergies  CHIEF COMPLAINT:  Manage thoracic aortic aneurysm, CHF and diabetes mellitus  HISTORY OF PRESENT ILLNESS: 80 year old Caucasian female was hospitalized secondary to radiating chest pain. After hospitalization she is admitted to this facility for short-term rehabilitation.  THORACIC AAA: CT angiography of the chest showed an intramural hematoma in the descending aorta extending from the stone to the left subclavian to the distal descending thoracic aorta. There were 2 areas of wall enhancement within the thickened hyperdense wall suspicious for ongoing intramural hemorrhage or area of small ulcerated plaque formation. There are was a fusiform atherosclerotic aneurysm in the mid descending aorta with the maximum Diameter of 5.2 cm. Vascular surgery recommended medical therapy with tight blood pressure control. She would be a candidate for stent graft repair of the aneurysm. But ideally need to wait for at least a month to allow the aortic wall hematoma to heal. Patient denies chest pain.  CHF:The patient does not relate significant weight changes, denies sob, DOE, orthopnea, PNDs, pedal edema, palpitations or chest pain.  CHF remains stable.  No complications form the medications being used.  DM:pt's DM remains stable.  Pt denies polyuria, polydipsia, polyphagia, changes in vision or hypoglycemic episodes.  No complications noted from the medication presently being used.  Last hemoglobin A1c is: 6.8.  PAST MEDICAL HISTORY :  Past Medical History  Diagnosis Date  . Diabetes mellitus without complication   . Hypertension   . AAA (abdominal aortic aneurysm)-repaired     PAST SURGICAL HISTORY: Past Surgical History  Procedure Laterality Date  . Abdominal hysterectomy    . Abdominal aortic aneurysm repair      SOCIAL HISTORY:  reports  that she has never smoked. She does not have any smokeless tobacco history on file. She reports that she does not drink alcohol.  FAMILY HISTORY: None  CURRENT MEDICATIONS: Reviewed per MAR/see medication list  REVIEW OF SYSTEMS:  See HPI otherwise 14 point ROS is negative.  PHYSICAL EXAMINATION  VS:  See VS section  GENERAL: no acute distress, normal body habitus EYES: conjunctivae normal, sclerae normal, normal eye lids MOUTH/THROAT: lips without lesions,no lesions in the mouth,tongue is without lesions,uvula elevates in midline NECK: supple, trachea midline, no neck masses, no thyroid tenderness, no thyromegaly LYMPHATICS: no LAN in the neck, no supraclavicular LAN RESPIRATORY: breathing is even & unlabored, BS CTAB CARDIAC: RRR, no murmur,no extra heart sounds, no edema GI:  ABDOMEN: abdomen soft, normal BS, no masses, no tenderness  LIVER/SPLEEN: no hepatomegaly, no splenomegaly MUSCULOSKELETAL: HEAD: normal to inspection  EXTREMITIES: LEFT UPPER EXTREMITY: full range of motion, normal strength & tone RIGHT UPPER EXTREMITY:  full range of motion, normal strength & tone LEFT LOWER EXTREMITY:  full range of motion, normal strength & tone RIGHT LOWER EXTREMITY:  full range of motion, normal strength & tone PSYCHIATRIC: the patient is alert & oriented to person, affect & behavior appropriate  LABS/RADIOLOGY:  Labs reviewed: Basic Metabolic Panel:  Recent Labs  40/98/11 1935  09/01/14 0400 09/02/14 0341  09/07/14 0328 09/10/14 0238 09/11/14 0855  NA  --   < > 136* 139  < > 139 136* 135*  K  --   < > 4.7 4.2  < > 3.9 5.5* 4.2  CL  --   < > 100 100  < > 98 99 99  CO2  --   < > 23 26  < > 24 22 22   GLUCOSE  --   < > 147* 163*  < > 146* 137* 180*  BUN  --   < > 27* 26*  < > 36* 32* 27*  CREATININE  --   < > 1.09 1.13*  < > 1.39* 1.05 1.11*  CALCIUM  --   < > 9.1 9.2  < > 10.2 9.9 10.2  MG 1.7  --   --  2.1  --   --   --   --   PHOS  --   --   --  4.0  --   --   --    --   < > = values in this interval not displayed. Liver Function Tests:  Recent Labs  08/28/14 0844  AST 13  ALT 11  ALKPHOS 50  BILITOT 0.2*  PROT 8.0  ALBUMIN 4.2    Recent Labs  08/28/14 0844  LIPASE 42   CBC:  Recent Labs  08/28/14 0844  09/04/14 0502 09/06/14 0400 09/07/14 0328  WBC 12.1*  < > 10.8* 9.5 10.2  NEUTROABS 9.9*  --   --   --   --   HGB 12.0  < > 10.0* 10.0* 10.4*  HCT 37.6  < > 31.1* 31.6* 32.9*  MCV 91.9  < > 89.9 90.3 90.1  PLT 230  < > 269 298 323  < > = values in this interval not displayed.  Lipid Panel:  Recent Labs  08/29/14 0025  HDL 33*   Cardiac Enzymes:  Recent Labs  09/01/14 1541 09/01/14 2141 09/02/14 0341  TROPONINI <0.30 <0.30 <0.30   CBG:  Recent Labs  09/11/14 2133 09/12/14 0619 09/12/14 1120  GLUCAP 103* 118* 104*    Transthoracic Echocardiography  Patient:    Leveta, Wahab MR #:       [id number] Study Date: 09/01/2014 Gender:     F Age:        30 Height:     160 cm Weight:     69.5 kg BSA:        1.78 m^2 Pt. Status: Room:       [facility]   ADMITTING    Berton Mount, M.D.  ATTENDING    Berton Mount, M.D.  ORDERING     Barrett, Rhonda G  REFERRING    Barrett, Joline Salt  SONOGRAPHER  Nolon Rod, RDCS  PERFORMING   [facility], Inpatient  cc:  ------------------------------------------------------------------- LV EF: 65%  ------------------------------------------------------------------- Indications:      Pericardial effusion 423.9.  ------------------------------------------------------------------- Study Conclusions  - Left ventricle: The estimated ejection fraction was 65%. - Mitral valve: Calcified annulus. - Pulmonary arteries: PA peak pressure: 38 mm Hg (S). - Impressions: Since 08/30/2014, there is no new significant   pericardial effusion.  Impressions:  - Since 08/30/2014, there is no new significant pericardial   effusion.  Transthoracic echocardiography.  M-mode, limited 2D,  limited spectral Doppler, and color Doppler.  Birthdate:  Patient birthdate: 17-Mar-1925.  Age:  Patient is 80 yr old.  Sex:  Gender: female.    BMI: 27.2 kg/m^2.  Blood pressure:     95/42  Patient status:  Inpatient.  Study date:  Study date: 09/01/2014. Study time: 03:57 PM.  Location:  ICU/CCU  -------------------------------------------------------------------  ------------------------------------------------------------------- Left ventricle:  The estimated ejection fraction was 65%.  ------------------------------------------------------------------- Aortic valve:  Sclerosis without stenosis.  ------------------------------------------------------------------- Mitral valve:   Calcified annulus.  Doppler:  There was no significant regurgitation.    Peak gradient (D): 6 mm Hg.   ------------------------------------------------------------------- Right ventricle:  The cavity size was normal. Systolic function was normal.  ------------------------------------------------------------------- Tricuspid valve:   Structurally normal valve.   Leaflet separation was normal.  Doppler:  Transvalvular velocity was within the normal range. There was no regurgitation.  ------------------------------------------------------------------- Measurements   Left ventricle                         Value        Reference  LV ID, ED, PLAX chordal                43.9  mm     43 - 52  LV ID, ES, PLAX chordal                31.8  mm     23 - 38  LV fx shortening, PLAX chordal (L)     28    %      >=29  LV PW thickness, ED                    12    mm     ---------  IVS/LV PW ratio, ED            (H)     1.32         <=1.3    Ventricular septum                     Value        Reference  IVS thickness, ED                      15.8  mm     ---------    Aorta                                  Value        Reference  Aortic root ID                         34    mm     ---------    Left atrium                             Value        Reference  LA ID, A-P, ES                         34    mm     ---------  LA ID/bsa, A-P                         1.91  cm/m^2 <=2.2    Mitral valve                           Value        Reference  Mitral E-wave peak velocity            122   cm/s   ---------  Mitral A-wave peak velocity            89.7  cm/s   ---------  Mitral  peak gradient, D                6     mm Hg  ---------  Mitral E/A ratio, peak                 1.36         ---------    Pulmonary arteries                     Value        Reference  PA pressure, S, DP             (H)     38    mm Hg  <=30    Tricuspid valve                        Value        Reference  Tricuspid regurg peak velocity         274   cm/s   ---------  Tricuspid peak RV-RA gradient          30    mm Hg  ---------    Systemic veins                         Value        Reference  Estimated CVP                          8     mm Hg  ---------    Right ventricle                        Value        Reference  RV pressure, S, DP             (H)     38    mm Hg  <=30  Legend: ESOPHOGRAM/BARIUM SWALLOW   TECHNIQUE: Single contrast examination was performed using  thin barium.   FLUOROSCOPY TIME:  2 min 0 seconds   COMPARISON:  None.   FINDINGS: A single contrast barium swallow was performed. Rapid sequence spot films of the cervical esophagus show no significant abnormality. Very minimal indentation upon the right lateral aspect of the lower cervical esophagus could be due to prominent thyroid tissue. Moderate tertiary contractions are present in the mid and distal esophagus. No hiatal hernia is seen and no reflux is demonstrated. A barium pill was given at the end of the study, but the patient could not swallow the barium tablet.   IMPRESSION: 1. Moderate tertiary contractions in the mid and distal esophagus. 2. Slight indentation upon the lower cervical esophagus from the right of midline could be due to  prominent thyroid tissue. Correlate clinically. 3. No hiatal hernia or reflux is demonstrated. The barium pill could not be swallowed by the patient.     CT ANGIOGRAPHY CHEST, ABDOMEN AND PELVIS   TECHNIQUE: Multidetector CT imaging through the chest, abdomen and pelvis was performed using the standard protocol during bolus administration of intravenous contrast. Multiplanar reconstructed images and MIPs were obtained and reviewed to evaluate the vascular anatomy.   CONTRAST:  80mL OMNIPAQUE IOHEXOL 350 MG/ML SOLN   COMPARISON:  06/22/2010   FINDINGS: CTA CHEST FINDINGS   Proximal descending thoracic aorta demonstrates wall thickening with hyperattenuation on the noncontrast phase beginning just distal to the left  subclavian artery and extending throughout the descending thoracic aorta diffusely. This appearance is consistent with descending thoracic aortic acute intramural hemorrhage. Postcontrast, there are 2 areas of wall enhancement within the thickened hyperdense wall, image 32 and 39 of series 5 suspicious for ongoing intramural hemorrhage versus areas of small ulcerated plaque formation. Also, there is a fusiform atherosclerotic descending thoracic aortic aneurysm measuring 5.1 x 5.2 cm, image 66 series 5. The transverse aorta and ascending aortic segments do not appear to be involved. Major branch vessels remain patent. No mediastinal hemorrhage or pericardial effusion. No pleural effusion. Negative for adenopathy. Mild enlargement of the inferior thyroid with scattered hypodense nodules.   Lung windows demonstrate respiratory motion artifact. Bibasilar minor atelectasis noted. Right middle lobe well-circumscribed 8 mm nodule again noted, stable compared to 06/22/2010 supporting a benign nodule. No focal pneumonia, collapse or consolidation. Negative for edema or pneumothorax. Trachea and central airways are patent. Coronary calcifications evident.   Review of the  MIP images confirms the above findings.   CTA ABDOMEN AND PELVIS FINDINGS   Mild ectasia of the abdominal aorta diffusely with calcific atherosclerosis. Patient appears status post infrarenal aortic aneurysm repair. No significant recurrent abdominal aneurysm, hemorrhage, retroperitoneal abnormality. 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 This can be seen with median arcuate ligament syndrome. SMA and main renal arteries are patent. 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   Tortuous atherosclerotic iliac vessels. Common, internal and external iliac arteries remain patent. No iliac aneurysm. Visualized common femoral, proximal profunda femoral and superficial femoral arteries are also patent.   Nonvascular: Stable small 12 mm hypodense cyst in the right hepatic dome. No biliary dilatation. Gallbladder, biliary system, pancreas, spleen, and adrenal glands are within normal limits for age and arterial phase imaging.   Kidneys demonstrate cortical thinning/ scarring but normal enhancement and excretion. Scattered small renal cysts noted as before. No significant interval change.   Negative for bowel obstruction, dilatation, ileus, or free air. Diffuse colonic diverticulosis noted.   No abdominal free fluid, fluid collection, hemorrhage, abscess, or adenopathy.   Within the pelvis, there is a chronic sigmoid diverticulosis. Small incidental 2 cm left ovarian cyst, image 243 series 5. Urinary bladder unremarkable. Rectum is stool-filled. No pelvic free fluid, fluid collection, hemorrhage, abscess, adenopathy, inguinal abnormality, or hernia. Prior hysterectomy noted.   Bones are osteopenic. Diffuse degenerative changes of the spine and associated scoliosis.   Review of the MIP images confirms the above findings.   IMPRESSION: Acute intramural hemorrhage of the descending  thoracic aorta with an associated mid descending thoracic aortic 5 cm aneurysm.   Abdominal aortic atherosclerotic changes, status post AAA repair.   Stable 8 mm right middle lobe well-circumscribed nodule compared to 2011 compatible with a benign nodule.   Stable hepatic and renal cysts   Bibasilar atelectasis   Colonic diverticulosis   No acute intra-abdominal or pelvic process   CT ANGIOGRAPHY CHEST WITH CONTRAST   TECHNIQUE: Multidetector CT imaging of the chest was performed using the standard protocol during bolus administration of intravenous contrast. Multiplanar CT image reconstructions and MIPs were obtained to evaluate the vascular anatomy.   CONTRAST:  80mL OMNIPAQUE IOHEXOL 350 MG/ML SOLN   COMPARISON:  08/28/2014   FINDINGS: Contrast reflux from the right atrium into hepatic veins. There good contrast opacification of pulmonary artery branches with no filling defects to suggest acute PE. There is compression of the superior left pulmonary  vein by at the descending thoracic aortic aneurysm. Mild plaque in the ascending aorta. Classic 3 vessel brachiocephalic arterial origin anatomy without proximal stenosis.   Hyperdense intramural hematoma in the distal arch beyond the subclavian origin extending into the distal descending thoracic segment. There is scattered plaque and displaced intimal calcifications. Fusiform aneurysmal dilatation of the mid descending thoracic aorta, 5.1 x 5.2 cm maximum transverse dimensions, stable since previous study, tapering to a diameter of 3 cm, tortuous in its distal descending segment above the diaphragm. The intramural hematoma does not extend below the diaphragm. The visualized proximal abdominal aorta shows moderate atheromatous change without dissection, aneurysm, or stenosis. Origin stenosis of the celiac axis with distal aneurysmal dilatation to 17 mm.   New bilateral pleural effusions left greater than right.  No pericardial effusion. Mild four-chamber cardiac enlargement. No hilar or mediastinal adenopathy identified. There is consolidation/ atelectasis in the posterior left lower lobe. Dependent atelectasis in the right lower lobe and left upper lobe. 11mm nodule, right middle lobe image 65/6, previously 8 mm on 06/22/2010.   Review of the MIP images confirms the above findings.   IMPRESSION: 1. Stable appearance of intramural hemorrhage in the descending thoracic aorta and 5.2 cm mid descending fusiform aneurysm. This results in compression of the adjacent superior left pulmonary vein. 2. New pleural effusions left greater than right, with consolidation/atelectasis in the posterior left lower lobe 3. 11 mm right lower lobe pulmonary nodule, previously 8 mm on 06/22/2010. Follow-up recommended to exclude low-grade neoplasm.     CHEST  2 VIEW   COMPARISON:  09/02/2014   FINDINGS: LEFT jugular central venous catheter with tip projecting over confluence of the SVC and LEFT brachiocephalic vein.   Enlargement of cardiac silhouette.   Atherosclerotic calcification aorta.   Mediastinal contours and pulmonary vascularity normal.   Slightly improved atelectasis versus consolidation in LEFT lower lobe.   Minimal RIGHT base atelectasis.   Upper lungs clear.   No pneumothorax.   Minimal LEFT pleural effusion.   IMPRESSION: Improving aeration LEFT lower lobe.   Enlargement of cardiac silhouette with minimal RIGHT base atelectasis.   MYOCARDIAL IMAGING WITH SPECT (REST AND PHARMACOLOGIC-STRESS)   GATED LEFT VENTRICULAR WALL MOTION STUDY   LEFT VENTRICULAR EJECTION FRACTION   TECHNIQUE: Standard myocardial SPECT imaging was performed after resting intravenous injection of 10 mCi [id number] sestamibi. Subsequently, intravenous infusion of Lexiscan was performed under the supervision of the Cardiology staff. At peak effect of the drug, 30 mCi [id number] sestamibi was injected  intravenously and standard myocardial SPECT imaging was performed. Quantitative gated imaging was also performed to evaluate left ventricular wall motion, and estimate left ventricular ejection fraction.   COMPARISON:  None.   FINDINGS: Baseline EKG:  NSR with RBBB   EKG during Lexiscan infusion: NSR with RBBB with no ST changes. The patient had chest pain during the infusion as well as SOB   RAW images deomnstrated increased gut uptake below the diaphragm and mild diaphragmatic attenuation   Perfusion: On rest images there is a small in size, mild intensity defect in the apical inferoseptum . There is small in size, mild in intensity defect in the mid and basal inferolateral wall. Stress images show small in size and mild in intensity defects in the apical inferoseptum and mid and basal inferolateral wall. These defects appear fixed with no ischemia.   Wall Motion: Normal left ventricular wall motion. No left ventricular dilation.   Left Ventricular Ejection Fraction: 73 %   End diastolic volume  79 ml   End systolic volume 22 ml   IMPRESSION: 1. Small sized, mild in intensity fixed defects noted in the apical inferoseptum and mid and basal inferolateral walls consistent with diaphragmatic attenuation. No reversible ischemia or infarction.   2. Normal left ventricular wall motion.   3. Left ventricular ejection fraction 73%   4. Low-risk stress test findings.    ASSESSMENT/PLAN:  Thoracic aortic aneurysm-followup with vascular surgeon in one month. Diabetes mellitus-well controlled CHF-compensated. Hypertension-blood pressure elevated. Medications adjusted. Chronic kidney disease stage III-recheck renal functions Hyperlipidemia-continue gemfibrozil Check CBC and BMP  I have reviewed patient's medical records received at admission/from hospitalization.  CPT CODE: 16109  Angela Cox, MD [facility] [phone]

## 2014-09-13 NOTE — Clinical Social Work Placement (Signed)
Clinical Social Work Department CLINICAL SOCIAL WORK PLACEMENT NOTE 09/13/2014  Patient:  [name],[name]  Account Number:  [id number] Admit date:  08/28/2014  Clinical Social Worker:  Seward SpeckLEO FICHT, [place]CS[place]  Date/time:  09/11/2014 12:30 PM  Clinical Social Work is seeking post-discharge placement for this patient at the following level of care:   SKILLED NURSING   (*CSW will update this form in Epic as items are completed)   09/11/2014  [facility]family provided with [facility] of [facility] Work's list of facilities offering this level of care within the geographic area requested by the patient (or if unable, by the patient's family).  09/11/2014  Patient/family informed of their freedom to choose among providers that offer the needed level of care, that participate in Medicare, Medicaid or managed care program needed by the patient, have an available bed and are willing to accept the patient.  09/11/2014  Patient/family informed of [facility]' ownership interest in [facility], as well as of the fact that they are under no obligation to receive care at this facility.  PASARR submitted to EDS on 09/11/2014 PASARR number received on 09/11/2014  FL2 transmitted to all facilities in geographic area requested by pt/family on  09/11/2014 FL2 transmitted to all facilities within larger geographic area on 09/11/2014  Patient informed that his/her managed care company has contracts with or will negotiate with  certain facilities, including the following:     Patient/family informed of bed offers received:  09[facility]12/2014 [facility] and patient chooses bed [facility]    [facility]  09/[facility]2014 Patient to be transferred to facility by Family via car Patient and family notified of transfer on 09/12/2014 Name of family member notified:  Husband Jennifer [name]  The following physician request were entered in  Epic:   Additional Comments:  Jennifer [name], MSW, LCSWA [phone] 09/13/2014 9:02 AM

## 2014-09-16 ENCOUNTER — Encounter: Payer: Self-pay | Admitting: Adult Health

## 2014-09-16 ENCOUNTER — Non-Acute Institutional Stay (SKILLED_NURSING_FACILITY): Payer: Medicare Other | Admitting: Adult Health

## 2014-09-16 DIAGNOSIS — E785 Hyperlipidemia, unspecified: Secondary | ICD-10-CM

## 2014-09-16 DIAGNOSIS — I712 Thoracic aortic aneurysm, without rupture: Secondary | ICD-10-CM

## 2014-09-16 DIAGNOSIS — I150 Renovascular hypertension: Secondary | ICD-10-CM

## 2014-09-16 DIAGNOSIS — I7123 Aneurysm of the descending thoracic aorta, without rupture: Secondary | ICD-10-CM

## 2014-09-16 DIAGNOSIS — N183 Chronic kidney disease, stage 3 unspecified: Secondary | ICD-10-CM

## 2014-09-16 DIAGNOSIS — I5032 Chronic diastolic (congestive) heart failure: Secondary | ICD-10-CM

## 2014-09-16 DIAGNOSIS — E1122 Type 2 diabetes mellitus with diabetic chronic kidney disease: Secondary | ICD-10-CM

## 2014-09-16 DIAGNOSIS — N189 Chronic kidney disease, unspecified: Secondary | ICD-10-CM

## 2014-09-16 NOTE — Progress Notes (Signed)
Patient ID: Jennifer [name], female   DOB: 1925/11/22, 80 y.o.   MRN: [id number]              PROGRESS NOTE  DATE: 09/16/2014   FACILITY: [facility]  LEVEL OF CARE: SNF (31)  Discharge Notes  HISTORY OF PRESENT ILLNESS:   This is an 80 year old female who is for discharge home with Home health PT, OT, ST,Nursing and CNA  She has been admitted to [facility] on 09/12/14 from [facility] with Descending thoracic aortic aneurysm and into mural hematoma. Patient was admitted to this facility for short-term rehabilitation after the patient's recent hospitalization.  Patient has completed SNF rehabilitation and therapy has cleared the patient for discharge.  Reassessment of ongoing problem(s):  HTN: Pt 's HTN remains stable.  Denies CP, sob, DOE, pedal edema, headaches, dizziness or visual disturbances.  No complications from the medications currently being used.  Last BP : 116/59  DM:pt's DM remains stable.  Pt denies polyuria, polydipsia, polyphagia, changes in vision or hypoglycemic episodes.  No complications noted from the medication presently being used.   9/15 hemoglobin A1c is: 6.8  HYPERLIPIDEMIA: No complications from the medications presently being used. 9/15 fasting lipid panel showed : Cholesterol 199 triglycerides 336 HDL 33 LDL 99   Past Medical History  Diagnosis Date  . Diabetes mellitus without complication   . Hypertension   . AAA (abdominal aortic aneurysm)-repaired   . Irregular heart beat   . Acute bronchitis   . Tachycardia     Unspecified     Reviewed.  No changes/see problem list  CURRENT MEDICATIONS: Reviewed per MAR/see medication list   No Known Allergies   REVIEW OF SYSTEMS:  GENERAL: no change in appetite, no fatigue, no weight changes, no fever, chills or weakness RESPIRATORY: no cough, SOB, DOE, wheezing, hemoptysis CARDIAC: no chest pain, edema or palpitations GI: no abdominal pain, diarrhea, constipation, heart burn,  nausea or vomiting  PHYSICAL EXAMINATION  GENERAL: no acute distress, normal body habitus EYES: conjunctivae normal, sclerae normal, normal eye lids NECK: supple, trachea midline, no neck masses, no thyroid tenderness, no thyromegaly LYMPHATICS: no LAN in the neck, no supraclavicular LAN RESPIRATORY: breathing is even & unlabored, BS CTAB CARDIAC: RRR, no murmur,no extra heart sounds, no edema GI: abdomen soft, normal BS, no masses, no tenderness, no hepatomegaly, no splenomegaly EXTREMITIES: able to move all 4 extremities PSYCHIATRIC: the patient is alert & oriented to person, affect & behavior appropriate  LABS/RADIOLOGY: Labs reviewed: Basic Metabolic Panel:  Recent Labs  40/98/11 1935  09/01/14 0400 09/02/14 0341  09/07/14 0328 09/10/14 0238 09/11/14 0855  NA  --   < > 136* 139  < > 139 136* 135*  K  --   < > 4.7 4.2  < > 3.9 5.5* 4.2  CL  --   < > 100 100  < > 98 99 99  CO2  --   < > 23 26  < > 24 22 22   GLUCOSE  --   < > 147* 163*  < > 146* 137* 180*  BUN  --   < > 27* 26*  < > 36* 32* 27*  CREATININE  --   < > 1.09 1.13*  < > 1.39* 1.05 1.11*  CALCIUM  --   < > 9.1 9.2  < > 10.2 9.9 10.2  MG 1.7  --   --  2.1  --   --   --   --  PHOS  --   --   --  4.0  --   --   --   --   < > = values in this interval not displayed. Liver Function Tests:  Recent Labs  08/28/14 0844  AST 13  ALT 11  ALKPHOS 50  BILITOT 0.2*  PROT 8.0  ALBUMIN 4.2    Recent Labs  08/28/14 0844  LIPASE 42   CBC:  Recent Labs  08/28/14 0844  09/04/14 0502 09/06/14 0400 09/07/14 0328  WBC 12.1*  < > 10.8* 9.5 10.2  NEUTROABS 9.9*  --   --   --   --   HGB 12.0  < > 10.0* 10.0* 10.4*  HCT 37.6  < > 31.1* 31.6* 32.9*  MCV 91.9  < > 89.9 90.3 90.1  PLT 230  < > 269 298 323  < > = values in this interval not displayed.  Lipid Panel:  Recent Labs  08/29/14 0025  HDL 33*   Cardiac Enzymes:  Recent Labs  09/01/14 1541 09/01/14 2141 09/02/14 0341  TROPONINI <0.30 <0.30  <0.30   CBG:  Recent Labs  09/11/14 2133 09/12/14 0619 09/12/14 1120  GLUCAP 103* 118* 104*   Dg Chest 2 View  09/06/2014   CLINICAL DATA:  Pleural effusions, history diabetes mellitus without complication, hypertension, abdominal aortic aneurysm  EXAM: CHEST  2 VIEW  COMPARISON:  09/02/2014  FINDINGS: LEFT jugular central venous catheter with tip projecting over confluence of the SVC and LEFT brachiocephalic vein.  Enlargement of cardiac silhouette.  Atherosclerotic calcification aorta.  Mediastinal contours and pulmonary vascularity normal.  Slightly improved atelectasis versus consolidation in LEFT lower lobe.  Minimal RIGHT base atelectasis.  Upper lungs clear.  No pneumothorax.  Minimal LEFT pleural effusion.  IMPRESSION: Improving aeration LEFT lower lobe.  Enlargement of cardiac silhouette with minimal RIGHT base atelectasis.   Electronically Signed   By: Ulyses Southward M.D.   On: 09/06/2014 14:18   Ct Angio Chest W/cm &/or Wo Cm  08/28/2014   CLINICAL DATA:  Chest pain, back pain, history of abdominal aortic aneurysm, diabetes, hypertension  EXAM: CT ANGIOGRAPHY CHEST, ABDOMEN AND PELVIS  TECHNIQUE: Multidetector CT imaging through the chest, abdomen and pelvis was performed using the standard protocol during bolus administration of intravenous contrast. Multiplanar reconstructed images and MIPs were obtained and reviewed to evaluate the vascular anatomy.  CONTRAST:  80mL OMNIPAQUE IOHEXOL 350 MG/ML SOLN  COMPARISON:  06/22/2010  FINDINGS: CTA CHEST FINDINGS  Proximal descending thoracic aorta demonstrates wall thickening with hyperattenuation on the noncontrast phase beginning just distal to the left subclavian artery and extending throughout the descending thoracic aorta diffusely. This appearance is consistent with descending thoracic aortic acute intramural hemorrhage. Postcontrast, there are 2 areas of wall enhancement within the thickened hyperdense wall, image 32 and 39 of series 5  suspicious for ongoing intramural hemorrhage versus areas of small ulcerated plaque formation. Also, there is a fusiform atherosclerotic descending thoracic aortic aneurysm measuring 5.1 x 5.2 cm, image 66 series 5. The transverse aorta and ascending aortic segments do not appear to be involved. Major branch vessels remain patent. No mediastinal hemorrhage or pericardial effusion. No pleural effusion. Negative for adenopathy. Mild enlargement of the inferior thyroid with scattered hypodense nodules.  Lung windows demonstrate respiratory motion artifact. Bibasilar minor atelectasis noted. Right middle lobe well-circumscribed 8 mm nodule again noted, stable compared to 06/22/2010 supporting a benign nodule. No focal pneumonia, collapse or consolidation. Negative for edema or pneumothorax. Trachea and  central airways are patent. Coronary calcifications evident.  Review of the MIP images confirms the above findings.  CTA ABDOMEN AND PELVIS FINDINGS  Mild ectasia of the abdominal aorta diffusely with calcific atherosclerosis. Patient appears status post infrarenal aortic aneurysm repair. No significant recurrent abdominal aneurysm, hemorrhage, retroperitoneal abnormality. 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 This can be seen with median arcuate ligament syndrome. SMA and main renal arteries are patent. 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  Tortuous atherosclerotic iliac vessels. Common, internal and external iliac arteries remain patent. No iliac aneurysm. Visualized common femoral, proximal profunda femoral and superficial femoral arteries are also patent.  Nonvascular: Stable small 12 mm hypodense cyst in the right hepatic dome. No biliary dilatation. Gallbladder, biliary system, pancreas, spleen, and adrenal glands are within normal limits for age and arterial phase imaging.  Kidneys demonstrate cortical  thinning/ scarring but normal enhancement and excretion. Scattered small renal cysts noted as before. No significant interval change.  Negative for bowel obstruction, dilatation, ileus, or free air. Diffuse colonic diverticulosis noted.  No abdominal free fluid, fluid collection, hemorrhage, abscess, or adenopathy.  Within the pelvis, there is a chronic sigmoid diverticulosis. Small incidental 2 cm left ovarian cyst, image 243 series 5. Urinary bladder unremarkable. Rectum is stool-filled. No pelvic free fluid, fluid collection, hemorrhage, abscess, adenopathy, inguinal abnormality, or hernia. Prior hysterectomy noted.  Bones are osteopenic. Diffuse degenerative changes of the spine and associated scoliosis.  Review of the MIP images confirms the above findings.  IMPRESSION: Acute intramural hemorrhage of the descending thoracic aorta with an associated mid descending thoracic aortic 5 cm aneurysm.  Abdominal aortic atherosclerotic changes, status post AAA repair.  Stable 8 mm right middle lobe well-circumscribed nodule compared to 2011 compatible with a benign nodule.  Stable hepatic and renal cysts  Bibasilar atelectasis  Colonic diverticulosis  No acute intra-abdominal or pelvic process  These results were called by telephone at the time of interpretation on 08/28/2014 at 2:23 pm to Dr. Jaci Carrel , who verbally acknowledged these results.   Electronically Signed   By: Ruel Favors M.D.   On: 08/28/2014 14:28   Nm Myocar Multi W/spect W/wall Motion / Ef  09/08/2014   CLINICAL DATA:  Chest pain  EXAM: MYOCARDIAL IMAGING WITH SPECT (REST AND PHARMACOLOGIC-STRESS)  GATED LEFT VENTRICULAR WALL MOTION STUDY  LEFT VENTRICULAR EJECTION FRACTION  TECHNIQUE: Standard myocardial SPECT imaging was performed after resting intravenous injection of 10 mCi Tc-35m sestamibi. Subsequently, intravenous infusion of Lexiscan was performed under the supervision of the Cardiology staff. At peak effect of the drug, 30 mCi  Tc-58m sestamibi was injected intravenously and standard myocardial SPECT imaging was performed. Quantitative gated imaging was also performed to evaluate left ventricular wall motion, and estimate left ventricular ejection fraction.  COMPARISON:  None.  FINDINGS: Baseline EKG:  NSR with RBBB  EKG during Lexiscan infusion: NSR with RBBB with no ST changes. The patient had chest pain during the infusion as well as SOB  RAW images deomnstrated increased gut uptake below the diaphragm and mild diaphragmatic attenuation  Perfusion: On rest images there is a small in size, mild intensity defect in the apical inferoseptum . There is small in size, mild in intensity defect in the mid and basal inferolateral wall. Stress images show small in size and mild in intensity defects in the apical inferoseptum and mid and basal inferolateral wall. These defects appear fixed with no  ischemia.  Wall Motion: Normal left ventricular wall motion. No left ventricular dilation.  Left Ventricular Ejection Fraction: 73 %  End diastolic volume 79 ml  End systolic volume 22 ml  IMPRESSION: 1. Small sized, mild in intensity fixed defects noted in the apical inferoseptum and mid and basal inferolateral walls consistent with diaphragmatic attenuation. No reversible ischemia or infarction.  2. Normal left ventricular wall motion.  3. Left ventricular ejection fraction 73%  4. Low-risk stress test findings.  *2012 Appropriate Use Criteria for Coronary Revascularization Focused Update: J Am Coll Cardiol. 2012;59(9):857-881. http://content.dementiazones.com.aspx?articleid=1201161   Electronically Signed   By: Armanda Magic   On: 09/08/2014 16:48   Dg Chest Port 1 View  09/02/2014   CLINICAL DATA:  Acute hypoxic respiratory failure, intramural aortic hematoma  EXAM: PORTABLE CHEST - 1 VIEW  COMPARISON:  09/01/2014  FINDINGS: Cardiac shadow remains mildly enlarged. A left jugular central line is again seen projecting over the left innominate  vein. Right lung demonstrates improved aeration with only minimal residual atelectasis. The large left-sided pleural effusion has decreased in the interval from the prior exam. Persistent left lower lobe consolidation is noted.  IMPRESSION: Improvement in left pleural effusion. Persistent left lower lobe consolidation is noted.  Improved aeration in the right lung base.   Electronically Signed   By: Alcide Clever M.D.   On: 09/02/2014 08:09   Dg Chest Port 1 View  09/01/2014   CLINICAL DATA:  New central line placement.  Hypoxia.  EXAM: PORTABLE CHEST - 1 VIEW  COMPARISON:  Multiple exams, including 09/01/2014 CT scan  FINDINGS: The left IJ catheter projects over the aorta but based on its curvature high finding more likely to be in the brachiocephalic vein. A lateral projection or CT chest could confirm this. No pneumothorax.  Large left pleural effusion with passive atelectasis in the left hemithorax. The patient has a known thoracic aortic aneurysm. Bandlike opacity at the right lung base.  IMPRESSION: 1. Left IJ line tip is strongly prefer to be in the brachiocephalic vein. 2. Large left pleural effusion with considerable passive atelectasis. Bandlike atelectasis at the right lung base. No pneumothorax observed.   Electronically Signed   By: Herbie Baltimore M.D.   On: 09/01/2014 17:01   Dg Chest Port 1 View  08/28/2014   CLINICAL DATA:  Chest pain  EXAM: PORTABLE CHEST - 1 VIEW  COMPARISON:  07/31/2010  FINDINGS: Mild cardiomegaly with central vascular congestion. Tortuous atherosclerotic descending thoracic aorta. Chronic medial left lower lobe atelectasis along the aortic contour. No definite focal pneumonia, collapse or consolidation. No edema, effusion or pneumothorax. Trachea midline.  IMPRESSION: Cardiomegaly with vascular congestion  Chronic left lower lobe atelectasis  Stable exam   Electronically Signed   By: Ruel Favors M.D.   On: 08/28/2014 09:53   Ct Angio Chest Aortic Dissect W &/or  W/o  09/01/2014   CLINICAL DATA:  Intramural hematoma, thoracic aortic aneurysm  EXAM: CT ANGIOGRAPHY CHEST WITH CONTRAST  TECHNIQUE: Multidetector CT imaging of the chest was performed using the standard protocol during bolus administration of intravenous contrast. Multiplanar CT image reconstructions and MIPs were obtained to evaluate the vascular anatomy.  CONTRAST:  80mL OMNIPAQUE IOHEXOL 350 MG/ML SOLN  COMPARISON:  08/28/2014  FINDINGS: Contrast reflux from the right atrium into hepatic veins. There good contrast opacification of pulmonary artery branches with no filling defects to suggest acute PE. There is compression of the superior left pulmonary vein by at the descending thoracic aortic aneurysm.  Mild plaque in the ascending aorta. Classic 3 vessel brachiocephalic arterial origin anatomy without proximal stenosis.  Hyperdense intramural hematoma in the distal arch beyond the subclavian origin extending into the distal descending thoracic segment. There is scattered plaque and displaced intimal calcifications. Fusiform aneurysmal dilatation of the mid descending thoracic aorta, 5.1 x 5.2 cm maximum transverse dimensions, stable since previous study, tapering to a diameter of 3 cm, tortuous in its distal descending segment above the diaphragm. The intramural hematoma does not extend below the diaphragm. The visualized proximal abdominal aorta shows moderate atheromatous change without dissection, aneurysm, or stenosis. Origin stenosis of the celiac axis with distal aneurysmal dilatation to 17 mm.  New bilateral pleural effusions left greater than right. No pericardial effusion. Mild four-chamber cardiac enlargement. No hilar or mediastinal adenopathy identified. There is consolidation/ atelectasis in the posterior left lower lobe. Dependent atelectasis in the right lower lobe and left upper lobe. 11mm nodule, right middle lobe image 65/6, previously 8 mm on 06/22/2010.  Review of the MIP images confirms  the above findings.  IMPRESSION: 1. Stable appearance of intramural hemorrhage in the descending thoracic aorta and 5.2 cm mid descending fusiform aneurysm. This results in compression of the adjacent superior left pulmonary vein. 2. New pleural effusions left greater than right, with consolidation/atelectasis in the posterior left lower lobe 3. 11 mm right lower lobe pulmonary nodule, previously 8 mm on 06/22/2010. Follow-up recommended to exclude low-grade neoplasm.   Electronically Signed   By: Oley Balmaniel  Hassell M.D.   On: 09/01/2014 13:26   Ct Angio Abd/pel W/ And/or W/o  08/28/2014   CLINICAL DATA:  Chest pain, back pain, history of abdominal aortic aneurysm, diabetes, hypertension  EXAM: CT ANGIOGRAPHY CHEST, ABDOMEN AND PELVIS  TECHNIQUE: Multidetector CT imaging through the chest, abdomen and pelvis was performed using the standard protocol during bolus administration of intravenous contrast. Multiplanar reconstructed images and MIPs were obtained and reviewed to evaluate the vascular anatomy.  CONTRAST:  80mL OMNIPAQUE IOHEXOL 350 MG/ML SOLN  COMPARISON:  06/22/2010  FINDINGS: CTA CHEST FINDINGS  Proximal descending thoracic aorta demonstrates wall thickening with hyperattenuation on the noncontrast phase beginning just distal to the left subclavian artery and extending throughout the descending thoracic aorta diffusely. This appearance is consistent with descending thoracic aortic acute intramural hemorrhage. Postcontrast, there are 2 areas of wall enhancement within the thickened hyperdense wall, image 32 and 39 of series 5 suspicious for ongoing intramural hemorrhage versus areas of small ulcerated plaque formation. Also, there is a fusiform atherosclerotic descending thoracic aortic aneurysm measuring 5.1 x 5.2 cm, image 66 series 5. The transverse aorta and ascending aortic segments do not appear to be involved. Major branch vessels remain patent. No mediastinal hemorrhage or pericardial effusion. No  pleural effusion. Negative for adenopathy. Mild enlargement of the inferior thyroid with scattered hypodense nodules.  Lung windows demonstrate respiratory motion artifact. Bibasilar minor atelectasis noted. Right middle lobe well-circumscribed 8 mm nodule again noted, stable compared to 06/22/2010 supporting a benign nodule. No focal pneumonia, collapse or consolidation. Negative for edema or pneumothorax. Trachea and central airways are patent. Coronary calcifications evident.  Review of the MIP images confirms the above findings.  CTA ABDOMEN AND PELVIS FINDINGS  Mild ectasia of the abdominal aorta diffusely with calcific atherosclerosis. Patient appears status post infrarenal aortic aneurysm repair. No significant recurrent abdominal aneurysm, hemorrhage, retroperitoneal abnormality. 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 This can be seen with median arcuate ligament syndrome. SMA  and main renal arteries are patent. 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  Tortuous atherosclerotic iliac vessels. Common, internal and external iliac arteries remain patent. No iliac aneurysm. Visualized common femoral, proximal profunda femoral and superficial femoral arteries are also patent.  Nonvascular: Stable small 12 mm hypodense cyst in the right hepatic dome. No biliary dilatation. Gallbladder, biliary system, pancreas, spleen, and adrenal glands are within normal limits for age and arterial phase imaging.  Kidneys demonstrate cortical thinning/ scarring but normal enhancement and excretion. Scattered small renal cysts noted as before. No significant interval change.  Negative for bowel obstruction, dilatation, ileus, or free air. Diffuse colonic diverticulosis noted.  No abdominal free fluid, fluid collection, hemorrhage, abscess, or adenopathy.  Within the pelvis, there is a chronic sigmoid diverticulosis. Small  incidental 2 cm left ovarian cyst, image 243 series 5. Urinary bladder unremarkable. Rectum is stool-filled. No pelvic free fluid, fluid collection, hemorrhage, abscess, adenopathy, inguinal abnormality, or hernia. Prior hysterectomy noted.  Bones are osteopenic. Diffuse degenerative changes of the spine and associated scoliosis.  Review of the MIP images confirms the above findings.  IMPRESSION: Acute intramural hemorrhage of the descending thoracic aorta with an associated mid descending thoracic aortic 5 cm aneurysm.  Abdominal aortic atherosclerotic changes, status post AAA repair.  Stable 8 mm right middle lobe well-circumscribed nodule compared to 2011 compatible with a benign nodule.  Stable hepatic and renal cysts  Bibasilar atelectasis  Colonic diverticulosis  No acute intra-abdominal or pelvic process  These results were called by telephone at the time of interpretation on 08/28/2014 at 2:23 pm to Dr. Jaci CarrelHRISTOPHER POLLINA , who verbally acknowledged these results.   Electronically Signed   By: Ruel Favorsrevor  Shick M.D.   On: 08/28/2014 14:28     ASSESSMENT/PLAN:  Descending thoracic aortic aneurysm - for followup with vascular surgeon and possible stent graft repair of her aneurysm; for home health PT, OT, ST, nursing and CNA Diastolic CHF - stable Chronic kidney disease, stage III - Lasix on hold Hypertension - well controlled; continue Plendil and Normodyne Diabetes mellitus, type II - well controlled; continue Glucophage Hyperlipidemia - continue gemfibrozil and Lipitor    I have filled out patient's discharge paperwork and written prescriptions.  Patient will receive home health PT, OT, ST, nursing and CNA.  Total discharge time: Less than 30 minutes  Discharge time involved coordination of the discharge process with Child psychotherapistsocial worker, nursing staff and therapy department. Medical justification for home health services verified.  CPT CODE: [id number]  [facility] Regional Health CenterMEDINA-VARGAS,MONINA, NP [facility] [phone]

## 2014-09-19 ENCOUNTER — Ambulatory Visit (INDEPENDENT_AMBULATORY_CARE_PROVIDER_SITE_OTHER): Payer: Medicare Other | Admitting: Cardiology

## 2014-09-19 ENCOUNTER — Encounter: Payer: Self-pay | Admitting: Cardiology

## 2014-09-19 ENCOUNTER — Encounter: Payer: Medicare Other | Admitting: Interventional Cardiology

## 2014-09-19 VITALS — BP 126/80 | HR 70 | Ht 60.0 in | Wt 133.0 lb

## 2014-09-19 DIAGNOSIS — N183 Chronic kidney disease, stage 3 unspecified: Secondary | ICD-10-CM

## 2014-09-19 DIAGNOSIS — I712 Thoracic aortic aneurysm, without rupture, unspecified: Secondary | ICD-10-CM

## 2014-09-19 DIAGNOSIS — R2689 Other abnormalities of gait and mobility: Secondary | ICD-10-CM

## 2014-09-19 DIAGNOSIS — I10 Essential (primary) hypertension: Secondary | ICD-10-CM

## 2014-09-19 DIAGNOSIS — I129 Hypertensive chronic kidney disease with stage 1 through stage 4 chronic kidney disease, or unspecified chronic kidney disease: Secondary | ICD-10-CM

## 2014-09-19 DIAGNOSIS — I7123 Aneurysm of the descending thoracic aorta, without rupture: Secondary | ICD-10-CM

## 2014-09-19 DIAGNOSIS — I5033 Acute on chronic diastolic (congestive) heart failure: Secondary | ICD-10-CM

## 2014-09-19 DIAGNOSIS — I7100 Dissection of unspecified site of aorta: Secondary | ICD-10-CM

## 2014-09-19 DIAGNOSIS — E119 Type 2 diabetes mellitus without complications: Secondary | ICD-10-CM

## 2014-09-19 DIAGNOSIS — I5032 Chronic diastolic (congestive) heart failure: Secondary | ICD-10-CM

## 2014-09-19 DIAGNOSIS — R079 Chest pain, unspecified: Secondary | ICD-10-CM

## 2014-09-19 NOTE — Progress Notes (Signed)
Jennifer [name] Date of Birth:  11/08/1925 [facility] [address] [place], [place]  [address] [phone]        Fax   [phone]   History of Present Illness: This 80 year old woman is seen by me for the first time today.  She was seen by me as a DOD work-in visit.  She had not gotten the message that her appointment with Dr. Eldridge DaceVaranasi had been rescheduled.  The patient was recently hospitalized from 08/28/14-09/12/14 with left-sided chest pain and was found to have an intramural aortic hematoma.  She was also found to have descending thoracic aortic aneurysm.  She had acute pulmonary edema and cardiogenic shock.  She has a history of acute on chronic diastolic heart failure New York Heart Association class III.  She also has a history of chronic kidney disease stage III.  When discharged from the hospital she spent one week [facility].  She returned to her own home yesterday where she lives with her husband.  She has been feeling reasonably well.  She has not been experiencing any significant exertional dyspnea.  She has not had any ischemic type chest discomfort.  Her blood pressures have been stable on current medication.  The plan at hospital discharge was for the patient to be seen in followup by Dr. Myra GianottiBrabham to discuss possible stent graft therapy for her descending thoracic aortic aneurysm.  Current Outpatient Prescriptions  Medication Sig Dispense Refill  . atorvastatin (LIPITOR) 20 MG tablet Take 1 tablet (20 mg total) by mouth daily at 6 PM.  30 tablet  11  . bisacodyl (DULCOLAX) 5 MG EC tablet Take 2 tablets (10 mg total) by mouth daily as needed for mild constipation or moderate constipation.      . calcium carbonate (OS-CAL) 600 MG TABS tablet Take 600 mg by mouth 2 (two) times daily with a meal.      . docusate sodium 100 MG CAPS Take 100 mg by mouth daily.      . felodipine (PLENDIL) 10 MG 24 hr tablet Take 10 mg by mouth daily.         Marland Kitchen. gemfibrozil (LOPID) 600 MG tablet Take 600 mg by mouth 2 (two) times daily before a meal.       . HYDROcodone-acetaminophen (NORCO/VICODIN) 5-325 MG per tablet Take 1 tablet by mouth every 6 (six) hours as needed for moderate pain.  20 tablet  0  . labetalol (NORMODYNE) 200 MG tablet Take 2 tablets (400 mg total) by mouth 3 (three) times daily.  180 tablet  11  . metFORMIN (GLUCOPHAGE) 500 MG tablet Take 500 mg by mouth 2 (two) times daily with a meal.       . Polyethyl Glycol-Propyl Glycol (SYSTANE OP) Place 1 drop into both eyes 2 (two) times daily.      . Vitamin D, Ergocalciferol, (DRISDOL) 50000 UNITS CAPS capsule Take 50,000 Units by mouth every 7 (seven) days. Every saturday       No current facility-administered medications for this visit.    No Known Allergies  Patient Active Problem List   Diagnosis Date Noted  . Hyperlipidemia 09/16/2014  . Diabetes 09/13/2014  . CHF (congestive heart failure) 09/13/2014  . HTN (hypertension) 09/13/2014  . Physical deconditioning 09/12/2014  . Descending thoracic aortic aneurysm 09/08/2014  . Acute on chronic diastolic CHF (congestive heart failure), NYHA class 3 09/08/2014  . Left sided chest pain 09/08/2014  . CKD (chronic  kidney disease), stage III 09/08/2014  . Acute respiratory failure with hypoxia 09/01/2014  . Acute pulmonary edema 09/01/2014  . Cardiogenic shock 09/01/2014  . Intramural aortic hematoma 08/28/2014    History  Smoking status  . Never Smoker   Smokeless tobacco  . Not on file    History  Alcohol Use No    No family history on file.  Review of Systems: Constitutional: no fever chills diaphoresis or fatigue or change in weight.  Head and neck: no hearing loss, no epistaxis, no photophobia or visual disturbance. Respiratory: No cough, shortness of breath or wheezing. Cardiovascular: No chest pain peripheral edema, palpitations. Gastrointestinal: No abdominal distention, no abdominal pain, no change in  bowel habits hematochezia or melena. Genitourinary: No dysuria, no frequency, no urgency, no nocturia. Musculoskeletal:No arthralgias, no back pain, no gait disturbance or myalgias. Neurological: No dizziness, no headaches, no numbness, no seizures, no syncope, no weakness, no tremors. Hematologic: No lymphadenopathy, no easy bruising. Psychiatric: No confusion, no hallucinations, no sleep disturbance.    Physical Exam: Filed Vitals:   09/19/14 1126  BP: 126/80  Pulse: 70   the general appearance reveals a well-developed well-nourished elderly woman in no distress.   Head and neck exam reveals that the pupils are equal and reactive.  The extraocular movements are full.  There is no scleral icterus.  Mouth and pharynx are benign.  No lymphadenopathy.  No carotid bruits.  The jugular venous pressure is normal.  Thyroid is not enlarged or tender.  Chest is clear to percussion and auscultation.  No rales or rhonchi.  Expansion of the chest is symmetrical.  Heart reveals no abnormal lift or heave.  First and second heart sounds are normal.  There is no murmur gallop rub or click.  The abdomen is soft and nontender.  Bowel sounds are normoactive.  There is no hepatosplenomegaly or mass.  There are no abdominal bruits.  Extremities reveal no phlebitis or edema.  Pedal pulses are good.  There is no cyanosis or clubbing.  Neurologic exam is normal strength and no lateralizing weakness.  No sensory deficits.  Integument reveals no rash   Assessment / Plan: 1. descending thoracic aortic aneurysm 2. recent intramural aortic hematoma 3. chronic diastolic heart failure 4. chronic kidney disease stage III 5. essential hypertension  Disposition: Continue present medication. Her blood pressure is satisfactorily controlled now.  We will have her make an appointment with Dr. Myra GianottiBrabham to discuss timing of possible stent graft surgery. She will followup with Dr.Varanasi in about one month

## 2014-09-19 NOTE — Assessment & Plan Note (Signed)
Patient has not been having any recurrent back pain or chest pain to suggest worsening of her thoracic aortic aneurysm.

## 2014-09-19 NOTE — Assessment & Plan Note (Signed)
The patient denies any recurrent dyspnea.

## 2014-09-19 NOTE — Assessment & Plan Note (Signed)
Patient denies any hypoglycemic episodes.

## 2014-09-19 NOTE — Patient Instructions (Signed)
Your physician recommends that you continue on your current medications as directed. Please refer to the Current Medication list given to you today.  Your physician recommends that you schedule a follow-up appointment in: 1 month with Dr Eldridge DaceVaranasi  WILL ARRANGE AN APPOINTMENT WITH DR Myra GianottiBRABHAM IN 1 WEEK

## 2014-09-19 NOTE — Assessment & Plan Note (Signed)
The patient remains off aspirin because of her prior intramural aortic hematoma

## 2014-09-23 ENCOUNTER — Encounter: Payer: Self-pay | Admitting: Surgery

## 2014-09-26 ENCOUNTER — Ambulatory Visit (HOSPITAL_COMMUNITY)
Admission: RE | Admit: 2014-09-26 | Discharge: 2014-09-26 | Disposition: A | Discharge status: Home / Self Care | Payer: Medicare Other | Source: Ambulatory Visit | Attending: Surgery | Admitting: Surgery

## 2014-09-26 ENCOUNTER — Other Ambulatory Visit: Payer: Self-pay

## 2014-09-26 ENCOUNTER — Other Ambulatory Visit: Payer: Self-pay | Admitting: Surgery

## 2014-09-26 ENCOUNTER — Ambulatory Visit (INDEPENDENT_AMBULATORY_CARE_PROVIDER_SITE_OTHER)
Admission: RE | Admit: 2014-09-26 | Discharge: 2014-09-26 | Disposition: A | Discharge status: Home / Self Care | Payer: Medicare Other | Source: Ambulatory Visit | Attending: Surgery | Admitting: Surgery

## 2014-09-26 ENCOUNTER — Encounter (HOSPITAL_COMMUNITY): Payer: Self-pay

## 2014-09-26 ENCOUNTER — Ambulatory Visit (INDEPENDENT_AMBULATORY_CARE_PROVIDER_SITE_OTHER): Payer: Medicare Other | Admitting: Surgery

## 2014-09-26 ENCOUNTER — Encounter: Payer: Self-pay | Admitting: Surgery

## 2014-09-26 VITALS — BP 97/74 | HR 58 | Temp 98.4°F | Resp 16 | Ht 63.0 in | Wt 143.0 lb

## 2014-09-26 DIAGNOSIS — Z01810 Encounter for preprocedural cardiovascular examination: Secondary | ICD-10-CM

## 2014-09-26 DIAGNOSIS — I712 Thoracic aortic aneurysm, without rupture, unspecified: Secondary | ICD-10-CM

## 2014-09-26 DIAGNOSIS — M546 Pain in thoracic spine: Secondary | ICD-10-CM

## 2014-09-26 DIAGNOSIS — M545 Low back pain, unspecified: Secondary | ICD-10-CM

## 2014-09-26 MED ORDER — IOHEXOL 350 MG/ML SOLN
80.0000 mL | Freq: Once | INTRAVENOUS | Status: AC | PRN
  Administered 2014-09-26: 80 mL via INTRAVENOUS

## 2014-09-26 NOTE — Progress Notes (Addendum)
Patient name: Jennifer [name] MRN: [id number] DOB: 08/26/1925 Sex: female   Referred by: Dr.  Patty SermonsBrackbill  Reason for referral:  Chief Complaint  Patient presents with  . TAA    REF- Dr. Patty SermonsBrackbill, here to discuss possible stent graft     HISTORY OF PRESENT ILLNESS: The patient comes in today for further evaluation of her descending thoracic aortic aneurysm.  The patient was hospitalized from 08/28/2014-09/12/2014 with left-sided chest pain.  Imaging studies revealed, initially, acute intramural hemorrhage of the descending thoracic aorta with an associated 5 cm aneurysm.  Her aneurysm causes compression of the superior left pulmonary vein.  She was managed medically and was ultimately able to be discharged home.  She is back today to further discuss surgical repair.  She does state that she began having back pain earlier today.  Patient has a history of a open abdominal aortic aneurysm repair.  She suffers from diabetes.  Her most recent hemoglobin A1c was 6.8.  Her hypercholesterolemia is treated with a statin.  She has a history of acute on chronic diastolic heart failure.  She has a history of stage III renal disease.  Most recent creatinine was 1.1.  After discharge from the hospital recently, she went to [facility].  She has been discharged to home where she lives with her husband.  She is walking with a walker.  Past Medical History  Diagnosis Date  . Diabetes mellitus without complication   . Hypertension   . AAA (abdominal aortic aneurysm)-repaired   . Irregular heart beat   . Acute bronchitis   . Tachycardia     Unspecified    Past Surgical History  Procedure Laterality Date  . Abdominal hysterectomy    . Abdominal aortic aneurysm repair    . Breast lumpectomy Left     Benign Mass    History   Social History  . Marital Status: Married    Spouse Name: N/A    Number of Children: N/A  . Years of Education: N/A   Occupational History  . Not  on file.   Social History Main Topics  . Smoking status: Never Smoker   . Smokeless tobacco: Not on file  . Alcohol Use: No  . Drug Use: No  . Sexual Activity: Not on file   Other Topics Concern  . Not on file   Social History Narrative  . No narrative on file    History reviewed. No pertinent family history.  Allergies as of 09/26/2014  . (No Known Allergies)    Current Outpatient Prescriptions on File Prior to Visit  Medication Sig Dispense Refill  . atorvastatin (LIPITOR) 20 MG tablet Take 1 tablet (20 mg total) by mouth daily at 6 PM.  30 tablet  11  . bisacodyl (DULCOLAX) 5 MG EC tablet Take 2 tablets (10 mg total) by mouth daily as needed for mild constipation or moderate constipation.      . calcium carbonate (OS-CAL) 600 MG TABS tablet Take 600 mg by mouth 2 (two) times daily with a meal.      . docusate sodium 100 MG CAPS Take 100 mg by mouth daily.      . felodipine (PLENDIL) 10 MG 24 hr tablet Take 10 mg by mouth daily.       Marland Kitchen. gemfibrozil (LOPID) 600 MG tablet Take 600 mg by mouth 2 (two) times daily before a meal.       . HYDROcodone-acetaminophen (NORCO/VICODIN) 5-325 MG per  tablet Take 1 tablet by mouth every 6 (six) hours as needed for moderate pain.  20 tablet  0  . labetalol (NORMODYNE) 200 MG tablet Take 2 tablets (400 mg total) by mouth 3 (three) times daily.  180 tablet  11  . metFORMIN (GLUCOPHAGE) 500 MG tablet Take 500 mg by mouth 2 (two) times daily with a meal.       . Polyethyl Glycol-Propyl Glycol (SYSTANE OP) Place 1 drop into both eyes 2 (two) times daily.      . Vitamin D, Ergocalciferol, (DRISDOL) 50000 UNITS CAPS capsule Take 50,000 Units by mouth every 7 (seven) days. Every saturday       No current facility-administered medications on file prior to visit.     REVIEW OF SYSTEMS: Cardiovascular: No chest pain, chest pressure, palpitations, orthopnea, or dyspnea on exertion. No claudication or rest pain,  No history of DVT or  phlebitis. Pulmonary: No productive cough, asthma or wheezing. Neurologic: No weakness, paresthesias, aphasia, or amaurosis. No dizziness. Hematologic: No bleeding problems or clotting disorders. Musculoskeletal: Positive mid back pain  Gastrointestinal: No blood in stool or hematemesis Genitourinary: No dysuria or hematuria. Psychiatric:: No history of major depression. Integumentary: No rashes or ulcers. Constitutional: No fever or chills.  PHYSICAL EXAMINATION: General: The patient appears their stated age.  Vital signs are BP 97/74  Pulse 58  Temp(Src) 98.4 F (36.9 C) (Oral)  Resp 16  Ht 5\' 3"  (1.6 m)  Wt 143 lb (64.864 kg)  BMI 25.34 kg/m2  SpO2 97% HEENT:  No gross abnormalities Pulmonary: Respirations are non-labored Abdomen: Soft and non-tender  Musculoskeletal: There are no major deformities.   Neurologic: No focal weakness or paresthesias are detected, Skin: There are no ulcer or rashes noted. Psychiatric: The patient has normal affect. Cardiovascular: There is a regular rate and rhythm without significant murmur appreciated. no carotid bruits.  Palpable femoral pulses.   Diagnostic Studies  I have reviewed her CT angiogram which shows a 5.2 cm mid descending thoracic aortic aneurysm with intramural hematoma.    Assessment:   descending thoracic aortic aneurysm    Plan: the patient is having ongoing back pain, which she states got a little worse today.  I am going to repeat her CT scan today.  She will also need preoperative carotid Doppler studies as well as lower extremity evaluation.  I discussed with the patient and her husband that I feel that she is a reasonable candidate for endovascular repair.  We discussed the risks and benefits of the operation which include the risk of cardiopulmonary complications, bleeding, worsening renal dysfunction, and paralysis.  They wish to proceed.  Because of the patient's previous infrarenal repair, I feel that she will need  a spinal drain to minimize the risk of paraparesis/paralysis.  I will try to coordinate with his Dr. Laneta SimmersBartle in the immediate future  [name] Rozycki IV, M.D. [facility] [place] Office: [phone] Pager:  [phone]   Addendum:  I spoke with husband via phone after CTA.  This shows continued improvement of IMH with slight increase in size of aneurysm, now 5.6, previously 5.2.  I will coordinate with Dr. Laneta SimmersBartle getting this repaired ASAP  [facility]

## 2014-09-27 ENCOUNTER — Encounter (HOSPITAL_COMMUNITY)
Admission: AD | Disposition: A | Discharge status: Skilled Nursing Facility | Payer: Self-pay | Source: Ambulatory Visit | Attending: Surgery

## 2014-09-27 ENCOUNTER — Inpatient Hospital Stay (HOSPITAL_COMMUNITY): Payer: Medicare Other

## 2014-09-27 ENCOUNTER — Encounter (HOSPITAL_COMMUNITY): Payer: Medicare Other | Admitting: Vascular Surgery

## 2014-09-27 ENCOUNTER — Encounter (HOSPITAL_COMMUNITY): Payer: Self-pay | Admitting: *Deleted

## 2014-09-27 ENCOUNTER — Telehealth: Payer: Self-pay

## 2014-09-27 ENCOUNTER — Inpatient Hospital Stay (HOSPITAL_COMMUNITY): Payer: Medicare Other | Admitting: Certified Registered"

## 2014-09-27 ENCOUNTER — Other Ambulatory Visit: Payer: Self-pay

## 2014-09-27 ENCOUNTER — Inpatient Hospital Stay (HOSPITAL_COMMUNITY)
Admission: AD | Admit: 2014-09-27 | Discharge: 2014-10-04 | DRG: 220 | Disposition: A | Discharge status: Skilled Nursing Facility | Payer: Medicare Other | Source: Ambulatory Visit | Attending: Surgery | Admitting: Surgery

## 2014-09-27 ENCOUNTER — Other Ambulatory Visit: Payer: Self-pay | Admitting: Physician Assistant

## 2014-09-27 DIAGNOSIS — R0902 Hypoxemia: Secondary | ICD-10-CM | POA: Diagnosis not present

## 2014-09-27 DIAGNOSIS — I712 Thoracic aortic aneurysm, without rupture, unspecified: Secondary | ICD-10-CM | POA: Diagnosis present

## 2014-09-27 DIAGNOSIS — I729 Aneurysm of unspecified site: Secondary | ICD-10-CM

## 2014-09-27 DIAGNOSIS — T829XXA Unspecified complication of cardiac and vascular prosthetic device, implant and graft, initial encounter: Secondary | ICD-10-CM

## 2014-09-27 DIAGNOSIS — D62 Acute posthemorrhagic anemia: Secondary | ICD-10-CM | POA: Diagnosis not present

## 2014-09-27 DIAGNOSIS — E780 Pure hypercholesterolemia: Secondary | ICD-10-CM | POA: Diagnosis present

## 2014-09-27 DIAGNOSIS — J9811 Atelectasis: Secondary | ICD-10-CM | POA: Diagnosis not present

## 2014-09-27 DIAGNOSIS — J90 Pleural effusion, not elsewhere classified: Secondary | ICD-10-CM | POA: Diagnosis not present

## 2014-09-27 DIAGNOSIS — I7101 Dissection of thoracic aorta: Secondary | ICD-10-CM

## 2014-09-27 DIAGNOSIS — I711 Thoracic aortic aneurysm, ruptured, unspecified: Secondary | ICD-10-CM

## 2014-09-27 DIAGNOSIS — I10 Essential (primary) hypertension: Secondary | ICD-10-CM | POA: Diagnosis present

## 2014-09-27 DIAGNOSIS — I5032 Chronic diastolic (congestive) heart failure: Secondary | ICD-10-CM | POA: Diagnosis present

## 2014-09-27 DIAGNOSIS — I129 Hypertensive chronic kidney disease with stage 1 through stage 4 chronic kidney disease, or unspecified chronic kidney disease: Secondary | ICD-10-CM | POA: Diagnosis present

## 2014-09-27 DIAGNOSIS — N183 Chronic kidney disease, stage 3 (moderate): Secondary | ICD-10-CM | POA: Diagnosis present

## 2014-09-27 DIAGNOSIS — E119 Type 2 diabetes mellitus without complications: Secondary | ICD-10-CM | POA: Diagnosis present

## 2014-09-27 HISTORY — PX: THORACIC AORTIC ENDOVASCULAR STENT GRAFT: SHX6112

## 2014-09-27 LAB — COMPREHENSIVE METABOLIC PANEL
ALT: 8 U/L (ref 0–35)
AST: 10 U/L (ref 0–37)
Albumin: 3.4 g/dL — ABNORMAL LOW (ref 3.5–5.2)
Alkaline Phosphatase: 62 U/L (ref 39–117)
Anion gap: 17 — ABNORMAL HIGH (ref 5–15)
BILIRUBIN TOTAL: 0.3 mg/dL (ref 0.3–1.2)
BUN: 26 mg/dL — ABNORMAL HIGH (ref 6–23)
CO2: 19 meq/L (ref 19–32)
CREATININE: 1.43 mg/dL — AB (ref 0.50–1.10)
Calcium: 10.1 mg/dL (ref 8.4–10.5)
Chloride: 100 mEq/L (ref 96–112)
GFR, EST AFRICAN AMERICAN: 37 mL/min — AB (ref >90)
GFR, EST NON AFRICAN AMERICAN: 32 mL/min — AB (ref >90)
Glucose, Bld: 120 mg/dL — ABNORMAL HIGH (ref 70–99)
Potassium: 4.1 mEq/L (ref 3.7–5.3)
Sodium: 136 mEq/L — ABNORMAL LOW (ref 137–147)
Total Protein: 7.4 g/dL (ref 6.0–8.3)

## 2014-09-27 LAB — BASIC METABOLIC PANEL
ANION GAP: 14 (ref 5–15)
BUN: 20 mg/dL (ref 6–23)
CALCIUM: 9.3 mg/dL (ref 8.4–10.5)
CO2: 20 mEq/L (ref 19–32)
Chloride: 105 mEq/L (ref 96–112)
Creatinine, Ser: 1.13 mg/dL — ABNORMAL HIGH (ref 0.50–1.10)
GFR, EST AFRICAN AMERICAN: 49 mL/min — AB (ref >90)
GFR, EST NON AFRICAN AMERICAN: 42 mL/min — AB (ref >90)
Glucose, Bld: 116 mg/dL — ABNORMAL HIGH (ref 70–99)
Potassium: 4.1 mEq/L (ref 3.7–5.3)
SODIUM: 139 meq/L (ref 137–147)

## 2014-09-27 LAB — CBC
HCT: 29.5 % — ABNORMAL LOW (ref 36.0–46.0)
HEMATOCRIT: 30.1 % — AB (ref 36.0–46.0)
Hemoglobin: 9.5 g/dL — ABNORMAL LOW (ref 12.0–15.0)
Hemoglobin: 9.4 g/dL — ABNORMAL LOW (ref 12.0–15.0)
MCH: 28 pg (ref 26.0–34.0)
MCH: 28.1 pg (ref 26.0–34.0)
MCHC: 31.6 g/dL (ref 30.0–36.0)
MCHC: 31.9 g/dL (ref 30.0–36.0)
MCV: 88.8 fL (ref 78.0–100.0)
MCV: 88.1 fL (ref 78.0–100.0)
PLATELETS: 258 10*3/uL (ref 150–400)
PLATELETS: 187 10*3/uL (ref 150–400)
RBC: 3.39 MIL/uL — AB (ref 3.87–5.11)
RBC: 3.35 MIL/uL — ABNORMAL LOW (ref 3.87–5.11)
RDW: 15.7 % — ABNORMAL HIGH (ref 11.5–15.5)
RDW: 16 % — AB (ref 11.5–15.5)
WBC: 5.8 10*3/uL (ref 4.0–10.5)
WBC: 6.1 10*3/uL (ref 4.0–10.5)

## 2014-09-27 LAB — PROTIME-INR
INR: 1.2 (ref 0.00–1.49)
INR: 1.24 (ref 0.00–1.49)
PROTHROMBIN TIME: 15.4 s — AB (ref 11.6–15.2)
PROTHROMBIN TIME: 15.7 s — AB (ref 11.6–15.2)

## 2014-09-27 LAB — MAGNESIUM: Magnesium: 1.6 mg/dL (ref 1.5–2.5)

## 2014-09-27 LAB — PREPARE RBC (CROSSMATCH)

## 2014-09-27 LAB — APTT
aPTT: 30 seconds (ref 24–37)
aPTT: 30 seconds (ref 24–37)

## 2014-09-27 LAB — MRSA PCR SCREENING: MRSA by PCR: NEGATIVE

## 2014-09-27 LAB — GLUCOSE, CAPILLARY: Glucose-Capillary: 121 mg/dL — ABNORMAL HIGH (ref 70–99)

## 2014-09-27 SURGERY — INSERTION, ENDOVASCULAR STENT GRAFT, AORTA, THORACIC
Anesthesia: General | Site: Chest

## 2014-09-27 MED ORDER — FENTANYL CITRATE 0.05 MG/ML IJ SOLN
INTRAMUSCULAR | Status: AC
  Filled 2014-09-27: qty 5

## 2014-09-27 MED ORDER — ALUM & MAG HYDROXIDE-SIMETH 200-200-20 MG/5ML PO SUSP: 15.0000 mL | ORAL | Status: DC | PRN

## 2014-09-27 MED ORDER — PANTOPRAZOLE SODIUM 40 MG PO TBEC
40.0000 mg | DELAYED_RELEASE_TABLET | Freq: Every day | ORAL | Status: DC
  Administered 2014-09-28 – 2014-10-04 (×7): 40 mg via ORAL
  Filled 2014-09-27 (×6): qty 1

## 2014-09-27 MED ORDER — DEXTROSE 5 % IV SOLN
1.5000 g | Freq: Two times a day (BID) | INTRAVENOUS | Status: AC
  Administered 2014-09-28 (×2): 1.5 g via INTRAVENOUS
  Filled 2014-09-27 (×2): qty 1.5

## 2014-09-27 MED ORDER — CHLORHEXIDINE GLUCONATE 4 % EX LIQD
60.0000 mL | Freq: Once | CUTANEOUS | Status: DC
  Filled 2014-09-27: qty 60

## 2014-09-27 MED ORDER — HYDRALAZINE HCL 20 MG/ML IJ SOLN: 5.0000 mg | INTRAMUSCULAR | Status: DC | PRN

## 2014-09-27 MED ORDER — GEMFIBROZIL 600 MG PO TABS
600.0000 mg | ORAL_TABLET | Freq: Two times a day (BID) | ORAL | Status: DC
  Administered 2014-09-28 – 2014-10-04 (×13): 600 mg via ORAL
  Filled 2014-09-27 (×16): qty 1

## 2014-09-27 MED ORDER — SODIUM CHLORIDE 0.9 % IR SOLN
Status: DC | PRN
  Administered 2014-09-27: 19:00:00

## 2014-09-27 MED ORDER — GUAIFENESIN-DM 100-10 MG/5ML PO SYRP: 15.0000 mL | ORAL_SOLUTION | ORAL | Status: DC | PRN

## 2014-09-27 MED ORDER — MIDAZOLAM HCL 2 MG/2ML IJ SOLN
INTRAMUSCULAR | Status: AC
  Filled 2014-09-27: qty 2

## 2014-09-27 MED ORDER — SODIUM CHLORIDE 0.9 % IV SOLN
INTRAVENOUS | Status: DC
  Administered 2014-09-27: 50 mL/h via INTRAVENOUS
  Administered 2014-09-28: 50 mL via INTRAVENOUS

## 2014-09-27 MED ORDER — DOCUSATE SODIUM 100 MG PO CAPS: 100.0000 mg | ORAL_CAPSULE | Freq: Every day | ORAL | Status: DC

## 2014-09-27 MED ORDER — ACETAMINOPHEN 650 MG RE SUPP: 325.0000 mg | RECTAL | Status: DC | PRN

## 2014-09-27 MED ORDER — SODIUM CHLORIDE 0.9 % IV SOLN: 500.0000 mL | Freq: Once | INTRAVENOUS | Status: AC | PRN

## 2014-09-27 MED ORDER — LABETALOL HCL 200 MG PO TABS
400.0000 mg | ORAL_TABLET | Freq: Three times a day (TID) | ORAL | Status: DC
  Administered 2014-09-27 – 2014-09-30 (×4): 400 mg via ORAL
  Filled 2014-09-27 (×13): qty 2

## 2014-09-27 MED ORDER — METFORMIN HCL 500 MG PO TABS
500.0000 mg | ORAL_TABLET | Freq: Two times a day (BID) | ORAL | Status: DC
  Administered 2014-09-28 – 2014-10-04 (×14): 500 mg via ORAL
  Filled 2014-09-27 (×17): qty 1

## 2014-09-27 MED ORDER — MORPHINE SULFATE 2 MG/ML IJ SOLN: 2.0000 mg | INTRAMUSCULAR | Status: DC | PRN

## 2014-09-27 MED ORDER — OXYCODONE HCL 5 MG PO TABS: 5.0000 mg | ORAL_TABLET | Freq: Four times a day (QID) | ORAL | Status: DC | PRN

## 2014-09-27 MED ORDER — CALCIUM CARBONATE 1250 (500 CA) MG PO TABS
1250.0000 mg | ORAL_TABLET | Freq: Two times a day (BID) | ORAL | Status: DC
  Administered 2014-09-28 – 2014-10-04 (×14): 1250 mg via ORAL
  Filled 2014-09-27 (×17): qty 1

## 2014-09-27 MED ORDER — BISACODYL 10 MG RE SUPP: 10.0000 mg | Freq: Every day | RECTAL | Status: DC | PRN

## 2014-09-27 MED ORDER — HYDRALAZINE HCL 20 MG/ML IJ SOLN
5.0000 mg | INTRAMUSCULAR | Status: AC | PRN
  Administered 2014-09-27 – 2014-09-28 (×2): 5 mg via INTRAVENOUS
  Filled 2014-09-27: qty 1

## 2014-09-27 MED ORDER — METOPROLOL TARTRATE 1 MG/ML IV SOLN
2.0000 mg | INTRAVENOUS | Status: DC | PRN
Start: 2014-09-27 — End: 2014-10-02

## 2014-09-27 MED ORDER — MIDAZOLAM HCL 5 MG/5ML IJ SOLN
INTRAMUSCULAR | Status: DC | PRN
  Administered 2014-09-27: 2 mg via INTRAVENOUS

## 2014-09-27 MED ORDER — ACETAMINOPHEN 325 MG PO TABS: 325.0000 mg | ORAL_TABLET | ORAL | Status: DC | PRN

## 2014-09-27 MED ORDER — SODIUM CHLORIDE 0.9 % IV SOLN
INTRAVENOUS | Status: DC
  Administered 2014-09-27: 17:00:00 via INTRAVENOUS

## 2014-09-27 MED ORDER — 0.9 % SODIUM CHLORIDE (POUR BTL) OPTIME
TOPICAL | Status: DC | PRN
  Administered 2014-09-27: 1000 mL

## 2014-09-27 MED ORDER — DOCUSATE SODIUM 100 MG PO CAPS
100.0000 mg | ORAL_CAPSULE | Freq: Every day | ORAL | Status: DC
  Administered 2014-09-28 – 2014-10-04 (×7): 100 mg via ORAL
  Filled 2014-09-27 (×7): qty 1

## 2014-09-27 MED ORDER — SUCCINYLCHOLINE CHLORIDE 20 MG/ML IJ SOLN
INTRAMUSCULAR | Status: DC | PRN
  Administered 2014-09-27: 100 mg via INTRAVENOUS

## 2014-09-27 MED ORDER — PHENOL 1.4 % MT LIQD: 1.0000 | OROMUCOSAL | Status: DC | PRN

## 2014-09-27 MED ORDER — PROPOFOL 10 MG/ML IV BOLUS
INTRAVENOUS | Status: DC | PRN
  Administered 2014-09-27: 120 mg via INTRAVENOUS

## 2014-09-27 MED ORDER — VITAMIN D (ERGOCALCIFEROL) 1.25 MG (50000 UNIT) PO CAPS
50000.0000 [IU] | ORAL_CAPSULE | ORAL | Status: DC
  Administered 2014-10-01: 50000 [IU] via ORAL
  Filled 2014-09-27: qty 1

## 2014-09-27 MED ORDER — LIDOCAINE HCL (CARDIAC) 20 MG/ML IV SOLN
INTRAVENOUS | Status: DC | PRN
  Administered 2014-09-27: 50 mg via INTRAVENOUS

## 2014-09-27 MED ORDER — METOPROLOL TARTRATE 1 MG/ML IV SOLN: 2.0000 mg | INTRAVENOUS | Status: DC | PRN

## 2014-09-27 MED ORDER — BISACODYL 5 MG PO TBEC: 10.0000 mg | DELAYED_RELEASE_TABLET | Freq: Every day | ORAL | Status: DC | PRN

## 2014-09-27 MED ORDER — VECURONIUM BROMIDE 10 MG IV SOLR
INTRAVENOUS | Status: DC | PRN
  Administered 2014-09-27: 5 mg via INTRAVENOUS
  Administered 2014-09-27: 1 mg via INTRAVENOUS
  Administered 2014-09-27: 2 mg via INTRAVENOUS

## 2014-09-27 MED ORDER — PANTOPRAZOLE SODIUM 40 MG PO TBEC: 40.0000 mg | DELAYED_RELEASE_TABLET | Freq: Every day | ORAL | Status: DC

## 2014-09-27 MED ORDER — IODIXANOL 320 MG/ML IV SOLN
INTRAVENOUS | Status: DC | PRN
  Administered 2014-09-27: 2 mL via INTRAVENOUS
  Administered 2014-09-27: 150 mL via INTRAVENOUS

## 2014-09-27 MED ORDER — ATORVASTATIN CALCIUM 20 MG PO TABS
20.0000 mg | ORAL_TABLET | Freq: Every day | ORAL | Status: DC
  Administered 2014-09-28 – 2014-10-04 (×7): 20 mg via ORAL
  Filled 2014-09-27 (×8): qty 1

## 2014-09-27 MED ORDER — HYDRALAZINE HCL 20 MG/ML IJ SOLN
INTRAMUSCULAR | Status: AC
Start: 2014-09-27 — End: 2014-09-27
  Filled 2014-09-27: qty 1

## 2014-09-27 MED ORDER — LABETALOL HCL 5 MG/ML IV SOLN
INTRAVENOUS | Status: AC
  Filled 2014-09-27: qty 4

## 2014-09-27 MED ORDER — LABETALOL HCL 5 MG/ML IV SOLN: 10.0000 mg | INTRAVENOUS | Status: DC | PRN

## 2014-09-27 MED ORDER — HYDROCODONE-ACETAMINOPHEN 5-325 MG PO TABS
1.0000 | ORAL_TABLET | Freq: Four times a day (QID) | ORAL | Status: DC | PRN
  Administered 2014-09-28: 1 via ORAL
  Filled 2014-09-27: qty 1

## 2014-09-27 MED ORDER — DEXTROSE 5 % IV SOLN
1.5000 g | INTRAVENOUS | Status: AC
  Filled 2014-09-27: qty 1.5

## 2014-09-27 MED ORDER — FENTANYL CITRATE 0.05 MG/ML IJ SOLN
INTRAMUSCULAR | Status: DC | PRN
  Administered 2014-09-27: 150 ug via INTRAVENOUS
  Administered 2014-09-27: 100 ug via INTRAVENOUS

## 2014-09-27 MED ORDER — PHENYLEPHRINE HCL 10 MG/ML IJ SOLN
20.0000 mg | INTRAVENOUS | Status: DC | PRN
  Administered 2014-09-27: 15 ug/min via INTRAVENOUS

## 2014-09-27 MED ORDER — DEXTROSE 5 % IV SOLN
1.5000 g | INTRAVENOUS | Status: AC
  Administered 2014-09-27 (×2): 1.5 g via INTRAVENOUS
  Filled 2014-09-27: qty 1.5

## 2014-09-27 MED ORDER — NEOSTIGMINE METHYLSULFATE 10 MG/10ML IV SOLN
INTRAVENOUS | Status: DC | PRN
  Administered 2014-09-27: 4 mg via INTRAVENOUS

## 2014-09-27 MED ORDER — POTASSIUM CHLORIDE CRYS ER 20 MEQ PO TBCR: 20.0000 meq | EXTENDED_RELEASE_TABLET | Freq: Every day | ORAL | Status: DC | PRN

## 2014-09-27 MED ORDER — ENOXAPARIN SODIUM 30 MG/0.3ML ~~LOC~~ SOLN
30.0000 mg | SUBCUTANEOUS | Status: DC
  Administered 2014-09-28 – 2014-09-30 (×3): 30 mg via SUBCUTANEOUS
  Filled 2014-09-27 (×4): qty 0.3

## 2014-09-27 MED ORDER — HEPARIN SODIUM (PORCINE) 1000 UNIT/ML IJ SOLN
INTRAMUSCULAR | Status: DC | PRN
  Administered 2014-09-27: 6000 [IU] via INTRAVENOUS

## 2014-09-27 MED ORDER — ACETAMINOPHEN 325 MG PO TABS
325.0000 mg | ORAL_TABLET | ORAL | Status: DC | PRN
  Administered 2014-09-28 – 2014-09-29 (×2): 650 mg via ORAL
  Filled 2014-09-27 (×2): qty 2

## 2014-09-27 MED ORDER — ALBUMIN HUMAN 5 % IV SOLN
INTRAVENOUS | Status: DC | PRN
  Administered 2014-09-27: 18:00:00 via INTRAVENOUS

## 2014-09-27 MED ORDER — MORPHINE SULFATE 2 MG/ML IJ SOLN
2.0000 mg | INTRAMUSCULAR | Status: DC | PRN
  Administered 2014-09-27 – 2014-09-28 (×4): 4 mg via INTRAVENOUS
  Administered 2014-10-01: 2 mg via INTRAVENOUS
  Filled 2014-09-27 (×3): qty 2
  Filled 2014-09-27: qty 1
  Filled 2014-09-27: qty 2

## 2014-09-27 MED ORDER — MAGNESIUM SULFATE 40 MG/ML IJ SOLN
2.0000 g | Freq: Every day | INTRAMUSCULAR | Status: AC | PRN
  Administered 2014-09-28: 2 g via INTRAVENOUS
  Filled 2014-09-27: qty 50

## 2014-09-27 MED ORDER — INSULIN ASPART 100 UNIT/ML ~~LOC~~ SOLN
0.0000 [IU] | Freq: Three times a day (TID) | SUBCUTANEOUS | Status: DC
  Administered 2014-09-28 – 2014-10-03 (×10): 2 [IU] via SUBCUTANEOUS

## 2014-09-27 MED ORDER — SODIUM CHLORIDE 0.9 % IV SOLN
INTRAVENOUS | Status: DC | PRN
  Administered 2014-09-27: 20:00:00 via INTRAVENOUS

## 2014-09-27 MED ORDER — LABETALOL HCL 5 MG/ML IV SOLN
10.0000 mg | INTRAVENOUS | Status: AC | PRN
  Administered 2014-09-27 – 2014-10-02 (×4): 10 mg via INTRAVENOUS
  Filled 2014-09-27 (×3): qty 4

## 2014-09-27 MED ORDER — SENNOSIDES-DOCUSATE SODIUM 8.6-50 MG PO TABS
1.0000 | ORAL_TABLET | Freq: Every evening | ORAL | Status: DC | PRN
  Filled 2014-09-27: qty 1

## 2014-09-27 MED ORDER — PROTAMINE SULFATE 10 MG/ML IV SOLN
INTRAVENOUS | Status: DC | PRN
  Administered 2014-09-27 (×5): 10 mg via INTRAVENOUS

## 2014-09-27 MED ORDER — VITAMIN D (ERGOCALCIFEROL) 1.25 MG (50000 UNIT) PO CAPS: 50000.0000 [IU] | ORAL_CAPSULE | ORAL | Status: DC

## 2014-09-27 MED ORDER — FELODIPINE ER 10 MG PO TB24
10.0000 mg | ORAL_TABLET | Freq: Every day | ORAL | Status: DC
  Administered 2014-09-28 – 2014-09-30 (×3): 10 mg via ORAL
  Filled 2014-09-27 (×4): qty 1

## 2014-09-27 MED ORDER — POTASSIUM CHLORIDE CRYS ER 20 MEQ PO TBCR: 20.0000 meq | EXTENDED_RELEASE_TABLET | Freq: Once | ORAL | Status: DC

## 2014-09-27 MED ORDER — LACTATED RINGERS IV SOLN
INTRAVENOUS | Status: DC | PRN
  Administered 2014-09-27: 17:00:00 via INTRAVENOUS

## 2014-09-27 MED ORDER — GLYCOPYRROLATE 0.2 MG/ML IJ SOLN
INTRAMUSCULAR | Status: DC | PRN
  Administered 2014-09-27: .7 mg via INTRAVENOUS
  Administered 2014-09-27: 0.2 mg via INTRAVENOUS

## 2014-09-27 MED ORDER — OXYCODONE-ACETAMINOPHEN 5-325 MG PO TABS
1.0000 | ORAL_TABLET | ORAL | Status: DC | PRN
  Administered 2014-09-27 – 2014-09-28 (×2): 2 via ORAL
  Administered 2014-09-29 (×2): 1 via ORAL
  Administered 2014-09-30 (×4): 2 via ORAL
  Administered 2014-10-01: 1 via ORAL
  Filled 2014-09-27 (×2): qty 2
  Filled 2014-09-27: qty 1
  Filled 2014-09-27: qty 2
  Filled 2014-09-27 (×3): qty 1
  Filled 2014-09-27 (×3): qty 2

## 2014-09-27 MED ORDER — ONDANSETRON HCL 4 MG/2ML IJ SOLN: 4.0000 mg | Freq: Four times a day (QID) | INTRAMUSCULAR | Status: DC | PRN

## 2014-09-27 MED ORDER — ONDANSETRON HCL 4 MG/2ML IJ SOLN
INTRAMUSCULAR | Status: DC | PRN
  Administered 2014-09-27: 4 mg via INTRAVENOUS

## 2014-09-27 MED ORDER — SODIUM CHLORIDE 0.9 % IV SOLN: INTRAVENOUS | Status: DC

## 2014-09-27 SURGICAL SUPPLY — 78 items
ADH SKN CLS APL DERMABOND .7 (GAUZE/BANDAGES/DRESSINGS) ×1
BAG BANDED W/RUBBER/TAPE 36X54 (MISCELLANEOUS) ×6 IMPLANT
BAG EQP BAND 135X91 W/RBR TAPE (MISCELLANEOUS) ×2
BAG SNAP BAND KOVER 36X36 (MISCELLANEOUS) ×5 IMPLANT
BLADE SURG 10 STRL SS (BLADE) ×2 IMPLANT
CANISTER SUCTION 2500CC (MISCELLANEOUS) ×3 IMPLANT
CATH ACCU-VU SIZ PIG 5F 100CM (CATHETERS) ×2 IMPLANT
CATH ACCU-VU SIZ PIG 5F 70CM (CATHETERS) ×2 IMPLANT
CATH BALLN TRILOBE 26-42 (BALLOONS) ×2 IMPLANT
CATH BEACON 5.038 65CM KMP-01 (CATHETERS) ×2 IMPLANT
CLIP TI MEDIUM 24 (CLIP) IMPLANT
CLIP TI WIDE RED SMALL 24 (CLIP) IMPLANT
COVER DOME SNAP 22 D (MISCELLANEOUS) ×3 IMPLANT
COVER MAYO STAND STRL (DRAPES) ×3 IMPLANT
COVER PROBE W GEL 5X96 (DRAPES) ×3 IMPLANT
COVER SURGICAL LIGHT HANDLE (MISCELLANEOUS) ×3 IMPLANT
DERMABOND ADVANCED (GAUZE/BANDAGES/DRESSINGS) ×2
DERMABOND ADVANCED .7 DNX12 (GAUZE/BANDAGES/DRESSINGS) ×1 IMPLANT
DEVICE CLOSURE PERCLS PRGLD 6F (VASCULAR PRODUCTS) IMPLANT
DRAPE C-ARM 42X72 X-RAY (DRAPES) ×2 IMPLANT
DRAPE TABLE COVER HEAVY DUTY (DRAPES) ×5 IMPLANT
DRSG TEGADERM 2-3/8X2-3/4 SM (GAUZE/BANDAGES/DRESSINGS) ×3 IMPLANT
DRYSEAL FLEXSHEATH 22FR 33CM (SHEATH) ×2
ELECT REM PT RETURN 9FT ADLT (ELECTROSURGICAL) ×6
ELECTRODE REM PT RTRN 9FT ADLT (ELECTROSURGICAL) ×2 IMPLANT
ENDOPROSTHESIS THORAC 34X34X10 (Endovascular Graft) IMPLANT
ENDOPROSTHESIS THORAC 34X34X20 (Endovascular Graft) IMPLANT
ENDOPROTH THORACIC 34X34X10 (Endovascular Graft) ×3 IMPLANT
ENDOPROTH THORACIC 34X34X20 (Endovascular Graft) ×3 IMPLANT
GLOVE BIOGEL PI IND STRL 6.5 (GLOVE) IMPLANT
GLOVE BIOGEL PI IND STRL 7.0 (GLOVE) IMPLANT
GLOVE BIOGEL PI IND STRL 7.5 (GLOVE) ×1 IMPLANT
GLOVE BIOGEL PI INDICATOR 6.5 (GLOVE) ×6
GLOVE BIOGEL PI INDICATOR 7.0 (GLOVE) ×2
GLOVE BIOGEL PI INDICATOR 7.5 (GLOVE) ×2
GLOVE ECLIPSE 6.5 STRL STRAW (GLOVE) ×2 IMPLANT
GLOVE SURG SS PI 7.5 STRL IVOR (GLOVE) ×3 IMPLANT
GOWN STRL REUS W/ TWL LRG LVL3 (GOWN DISPOSABLE) ×2 IMPLANT
GOWN STRL REUS W/ TWL XL LVL3 (GOWN DISPOSABLE) ×1 IMPLANT
GOWN STRL REUS W/TWL LRG LVL3 (GOWN DISPOSABLE) ×12
GOWN STRL REUS W/TWL XL LVL3 (GOWN DISPOSABLE) ×3
GRAFT BALLN CATH 65CM (STENTS) IMPLANT
HEMOSTAT SNOW SURGICEL 2X4 (HEMOSTASIS) IMPLANT
KIT BASIN OR (CUSTOM PROCEDURE TRAY) ×3 IMPLANT
KIT ROOM TURNOVER OR (KITS) ×3 IMPLANT
NDL PERC 18GX7CM (NEEDLE) ×1 IMPLANT
NEEDLE PERC 18GX7CM (NEEDLE) ×3 IMPLANT
NS IRRIG 1000ML POUR BTL (IV SOLUTION) ×3 IMPLANT
PACK AORTA (CUSTOM PROCEDURE TRAY) ×3 IMPLANT
PAD ARMBOARD 7.5X6 YLW CONV (MISCELLANEOUS) ×6 IMPLANT
PAD ELECT DEFIB RADIOL ZOLL (MISCELLANEOUS) ×4 IMPLANT
PENCIL BUTTON HOLSTER BLD 10FT (ELECTRODE) IMPLANT
PERCLOSE PROGLIDE 6F (VASCULAR PRODUCTS) ×9
PROTECTION STATION PRESSURIZED (MISCELLANEOUS)
SHEATH AVANTI 11CM 8FR (MISCELLANEOUS) ×2 IMPLANT
SHEATH DRYSEAL FLEX 22FR 33CM (SHEATH) IMPLANT
SPONGE GAUZE 4X4 12PLY STER LF (GAUZE/BANDAGES/DRESSINGS) ×2 IMPLANT
STATION PROTECTION PRESSURIZED (MISCELLANEOUS) IMPLANT
STENT GRAFT BALLN CATH 65CM (STENTS)
STOPCOCK MORSE 400PSI 3WAY (MISCELLANEOUS) ×3 IMPLANT
SUT ETHILON 3 0 PS 1 (SUTURE) IMPLANT
SUT PROLENE 5 0 C 1 24 (SUTURE) IMPLANT
SUT VIC AB 2-0 CT1 27 (SUTURE)
SUT VIC AB 2-0 CT1 TAPERPNT 27 (SUTURE) IMPLANT
SUT VIC AB 3-0 SH 27 (SUTURE)
SUT VIC AB 3-0 SH 27X BRD (SUTURE) IMPLANT
SUT VICRYL 4-0 PS2 18IN ABS (SUTURE) ×4 IMPLANT
SYR 20CC LL (SYRINGE) ×6 IMPLANT
SYR 30ML LL (SYRINGE) IMPLANT
SYR 5ML LL (SYRINGE) IMPLANT
SYR MEDRAD MARK V 150ML (SYRINGE) ×3 IMPLANT
SYRINGE 10CC LL (SYRINGE) ×17 IMPLANT
TOWEL OR 17X24 6PK STRL BLUE (TOWEL DISPOSABLE) ×6 IMPLANT
TOWEL OR 17X26 10 PK STRL BLUE (TOWEL DISPOSABLE) ×6 IMPLANT
TRAY FOLEY CATH 16FRSI W/METER (SET/KITS/TRAYS/PACK) ×3 IMPLANT
TUBING HIGH PRESSURE 120CM (CONNECTOR) ×3 IMPLANT
WIRE BENTSON .035X145CM (WIRE) ×4 IMPLANT
WIRE STIFF LUNDERQUIST 260CM (WIRE) ×4 IMPLANT

## 2014-09-27 NOTE — Op Note (Signed)
Patient name: Jennifer MusselJanie B [name] MRN: [id number] DOB: 06/24/1925 Sex: female  09/27/2014 Pre-operative Diagnosis: Symptomatic Descending Thoracic Aortic Aneurysm Post-operative diagnosis:  Same Surgeon:  Jorge NyBRABHAM IV, V. WELLS Co-Surgeon:  Evelene CroonBryan Bartle Procedure:   #1:  Endovascular Repair of descending Thoracic aneurysm, without coverage of Left Subclavian Artey   #2:  Distal Extension x 1   #3:  Ulatrasound Guided Access, Left Common femoral Artery   #4  Thoracic Aortic angiogram   #5  Catheter in Aorta x2   Anesthesia:  General Blood Loss:  See anesthesia record Specimens:  None  Findings:  Complete exclusion Devices Used:  Proximal piece GORE CTAG 34x20, Distal Extension GORE CTAG 34x10  Indications:  This is an 80 year old female who initially presented about 1 month ago with chest and back pain.  She was found to have an IMH of her descending thoracic aorta associated with a 5.2cm aneurysm.  She was treated medically.  I saw her yesterday in the office and she was having back pain.  I repeated her CT scan which showed continued inprovement in her IMH, however her aneurysm had increased to 5.6 cm.  She was scheduled for elective repair this Thursday.   She called the office today with worsening back pain and SBP od 84.  She was brought into the hospital for urgent repair.  Procedure:  The patient was identified in the holding area and [facility]  The patient was then placed supine on the table. general anesthesia was administered.  The patient was prepped and draped in the usual sterile fashion.  A time out was called and antibiotics were administered.  A lumbar drain was placed because the patient has a history of open AAA repair.  The left femoral artery was evaluated with ultrasound and found to have mild plaque.  A digital picture was acquired.  A #11 blade was used to make a skin nick.  The left common femoral artery was canulated under ultrasound guidance with an 18 G needle.   A benson wire was inserted.  I used a KMP catheter to get the wire into the aorta.  A 102F dilator was used to dilate the subcutaneous tract.  Proglide devices were deployed at the 11 o'clock and 1 o'clock position for preclosure.  An 102F sheath was placed.  The patient was fully heparinized.  A pigtail catheter was advanced into the ascending aorta, followed by a Lunderquist wire.  A 22 F sheath was then advanced into the aorta.  The Proximal piece was prepared.  This was a GORE CTAG 34x20 device.  It was inserted through the sheath into the descending thoracic aorta.  A buddy wire was place throught the sheath, and the pigtail catheter was advanced into the ascending aorta.  A aortic arch angiogram was performed, locating the subclavian artery.  The device was then advanced past the subclavian artery and then pulled back into position.  The Lunderquist wire was advanced forward so that the graft was on the upper wall.  The device was then deployed.  Likely, because of the tortuosity within the descending thoracic aorta, but device kicked back.  At this point I felt extending proximally would be ideal.  The pigtail catheter was removed and a Gore CTAG 34 x 10 device was prepared in the back table and inserted.  Because the tortuosity within the descending thoracic aorta.  I had extreme difficulty advancing the device into the initial device.  Once  this was accomplished, it was impossible to get the 34 x 10 device extending out proximal to the 34 x 20 device.  We performed angiography and this showed no evidence of a type IA leak.  Rather than get into potential complications, I elected to deploy the 3410 device as a distal extension.  By doing this, it would enable the graft to go around the bend of her aorta.  After this was deployed, the device overlap was molded using a Tri-Lobe balloon.  A completion angiogram was performed which showed successful exclusion of aneurysm.  At this point catheters and wires were  removed.  The 22 French sheath was withdrawn and the arteriotomy was closed by securing the previously deployed program devices.  50 mg of protamine was administered.  The skin incision was closed with 4-0 Vicryl and Dermabond.  The patient was successfully extubated and found to be moving all 4 extremities.  She had good Doppler signals in her feet.  There is immediate complications.   Disposition:  To PACU in stable condition.   Juleen ChinaV. Wells Brabham, M.D. [facility] [place] Office: [phone] Pager:  [phone]

## 2014-09-27 NOTE — H&P (View-Only) (Signed)
   Patient name: Jennifer [name] MRN: [id number] DOB: 06/04/1925 Sex: female   Referred by: Dr.  Brackbill  Reason for referral:  Chief Complaint  Patient presents with  . TAA    REF- Dr. Brackbill, here to discuss possible stent graft     HISTORY OF PRESENT ILLNESS: The patient comes in today for further evaluation of her descending thoracic aortic aneurysm.  The patient was hospitalized from 08/28/2014-09/12/2014 with left-sided chest pain.  Imaging studies revealed, initially, acute intramural hemorrhage of the descending thoracic aorta with an associated 5 cm aneurysm.  Her aneurysm causes compression of the superior left pulmonary vein.  She was managed medically and was ultimately able to be discharged home.  She is back today to further discuss surgical repair.  She does state that she began having back pain earlier today.  Patient has a history of a open abdominal aortic aneurysm repair.  She suffers from diabetes.  Her most recent hemoglobin A1c was 6.8.  Her hypercholesterolemia is treated with a statin.  She has a history of acute on chronic diastolic heart failure.  She has a history of stage III renal disease.  Most recent creatinine was 1.1.  After discharge from the hospital recently, she went to [facility] for rehabilitation.  She has been discharged to home where she lives with her husband.  She is walking with a walker.  Past Medical History  Diagnosis Date  . Diabetes mellitus without complication   . Hypertension   . AAA (abdominal aortic aneurysm)-repaired   . Irregular heart beat   . Acute bronchitis   . Tachycardia     Unspecified    Past Surgical History  Procedure Laterality Date  . Abdominal hysterectomy    . Abdominal aortic aneurysm repair    . Breast lumpectomy Left     Benign Mass    History   Social History  . Marital Status: Married    Spouse Name: N/A    Number of Children: N/A  . Years of Education: N/A   Occupational History  . Not  on file.   Social History Main Topics  . Smoking status: Never Smoker   . Smokeless tobacco: Not on file  . Alcohol Use: No  . Drug Use: No  . Sexual Activity: Not on file   Other Topics Concern  . Not on file   Social History Narrative  . No narrative on file    History reviewed. No pertinent family history.  Allergies as of 09/26/2014  . (No Known Allergies)    Current Outpatient Prescriptions on File Prior to Visit  Medication Sig Dispense Refill  . atorvastatin (LIPITOR) 20 MG tablet Take 1 tablet (20 mg total) by mouth daily at 6 PM.  30 tablet  11  . bisacodyl (DULCOLAX) 5 MG EC tablet Take 2 tablets (10 mg total) by mouth daily as needed for mild constipation or moderate constipation.      . calcium carbonate (OS-CAL) 600 MG TABS tablet Take 600 mg by mouth 2 (two) times daily with a meal.      . docusate sodium 100 MG CAPS Take 100 mg by mouth daily.      . felodipine (PLENDIL) 10 MG 24 hr tablet Take 10 mg by mouth daily.       . gemfibrozil (LOPID) 600 MG tablet Take 600 mg by mouth 2 (two) times daily before a meal.       . HYDROcodone-acetaminophen (NORCO/VICODIN) 5-325 MG per   tablet Take 1 tablet by mouth every 6 (six) hours as needed for moderate pain.  20 tablet  0  . labetalol (NORMODYNE) 200 MG tablet Take 2 tablets (400 mg total) by mouth 3 (three) times daily.  180 tablet  11  . metFORMIN (GLUCOPHAGE) 500 MG tablet Take 500 mg by mouth 2 (two) times daily with a meal.       . Polyethyl Glycol-Propyl Glycol (SYSTANE OP) Place 1 drop into both eyes 2 (two) times daily.      . Vitamin D, Ergocalciferol, (DRISDOL) 50000 UNITS CAPS capsule Take 50,000 Units by mouth every 7 (seven) days. Every saturday       No current facility-administered medications on file prior to visit.     REVIEW OF SYSTEMS: Cardiovascular: No chest pain, chest pressure, palpitations, orthopnea, or dyspnea on exertion. No claudication or rest pain,  No history of DVT or  phlebitis. Pulmonary: No productive cough, asthma or wheezing. Neurologic: No weakness, paresthesias, aphasia, or amaurosis. No dizziness. Hematologic: No bleeding problems or clotting disorders. Musculoskeletal: Positive mid back pain  Gastrointestinal: No blood in stool or hematemesis Genitourinary: No dysuria or hematuria. Psychiatric:: No history of major depression. Integumentary: No rashes or ulcers. Constitutional: No fever or chills.  PHYSICAL EXAMINATION: General: The patient appears their stated age.  Vital signs are BP 97/74  Pulse 58  Temp(Src) 98.4 F (36.9 C) (Oral)  Resp 16  Ht 5\' 3"  (1.6 m)  Wt 143 lb (64.864 kg)  BMI 25.34 kg/m2  SpO2 97% HEENT:  No gross abnormalities Pulmonary: Respirations are non-labored Abdomen: Soft and non-tender  Musculoskeletal: There are no major deformities.   Neurologic: No focal weakness or paresthesias are detected, Skin: There are no ulcer or rashes noted. Psychiatric: The patient has normal affect. Cardiovascular: There is a regular rate and rhythm without significant murmur appreciated. no carotid bruits.  Palpable femoral pulses.   Diagnostic Studies  I have reviewed her CT angiogram which shows a 5.2 cm mid descending thoracic aortic aneurysm with intramural hematoma.    Assessment:   descending thoracic aortic aneurysm    Plan: the patient is having ongoing back pain, which she states got a little worse today.  I am going to repeat her CT scan today.  She will also need preoperative carotid Doppler studies as well as lower extremity evaluation.  I discussed with the patient and her husband that I feel that she is a reasonable candidate for endovascular repair.  We discussed the risks and benefits of the operation which include the risk of cardiopulmonary complications, bleeding, worsening renal dysfunction, and paralysis.  They wish to proceed.  Because of the patient's previous infrarenal repair, I feel that she will need  a spinal drain to minimize the risk of paraparesis/paralysis.  I will try to coordinate with his Dr. Laneta SimmersBartle in the immediate future  V. Charlena CrossWells Kristinia Leavy IV, M.D. [facility] [place] Office: [phone] Pager:  [phone]   Addendum:  I spoke with husband via phone after CTA.  This shows continued improvement of IMH with slight increase in size of aneurysm, now 5.6, previously 5.2.  I will coordinate with Dr. Laneta SimmersBartle getting this repaired ASAP  WB

## 2014-09-27 NOTE — Telephone Encounter (Signed)
Discussed pt's symptoms with Dr. Myra GianottiBrabham.  Recommended to admit pt. To the ICU at [facility] this afternoon; keep pt. NPO.  Notified pt's. husband of Dr. Estanislado SpireBrabham's recommendation.  Agrees with plan.  Will take pt. To hospital now.  Advised to keep pt. NPO; stated she just ate, but understood not to allow her to eat/ drink anything more.  [facility]: plan to admit pt. to hospital today.

## 2014-09-27 NOTE — Transfer of Care (Signed)
Immediate Anesthesia Transfer of Care Note  Patient: Jennifer [name]  Procedure(s) Performed: Procedure(s): THORACIC AORTIC ENDOVASCULAR STENT GRAFT (N/A)  Patient Location: PACU  Anesthesia Type:General  Level of Consciousness: awake and patient cooperative  Airway & Oxygen Therapy: Patient Spontanous Breathing and Patient connected to face mask oxygen  Post-op Assessment: Report given to PACU RN, Post -op Vital signs reviewed and stable and Patient moving all extremities X 4  Post vital signs: Reviewed and stable  Complications: No apparent anesthesia complications

## 2014-09-27 NOTE — Anesthesia Preprocedure Evaluation (Addendum)
Anesthesia Evaluation  Patient identified by MRN, date of birth, ID band Patient awake    Reviewed: Allergy & Precautions, H&P , NPO status , Patient's Chart, lab work & pertinent test results, reviewed documented beta blocker date and time   Airway Mallampati: III  TM Distance: >3 FB Neck ROM: Full    Dental no notable dental hx. (+) Teeth Intact, Dental Advisory Given   Pulmonary neg pulmonary ROS,  breath sounds clear to auscultation  Pulmonary exam normal       Cardiovascular hypertension, Pt. on medications and Pt. on home beta blockers + Peripheral Vascular Disease and +CHF Rhythm:Regular Rate:Normal     Neuro/Psych negative neurological ROS  negative psych ROS   GI/Hepatic negative GI ROS, Neg liver ROS,   Endo/Other  diabetes, Type 2, Oral Hypoglycemic Agents  Renal/GU negative Renal ROS  negative genitourinary   Musculoskeletal   Abdominal   Peds  Hematology negative hematology ROS (+)   Anesthesia Other Findings   Reproductive/Obstetrics negative OB ROS                         Anesthesia Physical Anesthesia Plan  ASA: III and emergent  Anesthesia Plan: General   Post-op Pain Management:    Induction: Intravenous, Rapid sequence and Cricoid pressure planned  Airway Management Planned: Oral ETT  Additional Equipment: Arterial line, CVP, Ultrasound Guidance Line Placement and Spinal Drain  Intra-op Plan:   Post-operative Plan: Possible Post-op intubation/ventilation  Informed Consent: I have reviewed the patients History and Physical, chart, labs and discussed the procedure including the risks, benefits and alternatives for the proposed anesthesia with the patient or authorized representative who has indicated his/her understanding and acceptance.   Dental advisory given  Plan Discussed with: CRNA  Anesthesia Plan Comments: (Per [facility], Dr. Myra GianottiBrabham requests lumbar drain  preoperatively.  Shonna ChockAllison Zelenak, PA-C)       Anesthesia Quick Evaluation

## 2014-09-27 NOTE — Anesthesia Postprocedure Evaluation (Signed)
Anesthesia Post Note  Patient: Jennifer [name]  Procedure(s) Performed: Procedure(s) (LRB): THORACIC AORTIC ENDOVASCULAR STENT GRAFT (N/A)  Anesthesia type: general  Patient location: PACU  Post pain: Pain level controlled  Post assessment: Patient's Cardiovascular Status Stable  Last Vitals:  Filed Vitals:   09/27/14 2245  BP: 173/66  Pulse: 73  Temp:   Resp: 18    Post vital signs: Reviewed and stable  Level of consciousness: sedated  Complications: No apparent anesthesia complications

## 2014-09-27 NOTE — Telephone Encounter (Signed)
Rec'd phone call from [facility] Physical Therapist that upon her evaluation today, pt. C/o worsening back pain and stated she hadn't slept very well.  Reported BP 84/58, P. 64.  Called pt. To check on her symptoms.  Spoke with pt's husband.  Per pt's husband, the pt. has c/o worsening back pain today; she took a Hydrocodone/ Acetaminophen 5/325 mg tablet, and now reports feeling better.  Advised husband if symptoms worsen ie: increased back pain, feeling faint or short of breath, she should go to the ER.  Husband verb. Understanding.  Will make Dr. Myra GianottiBrabham aware of pt's symptoms.

## 2014-09-27 NOTE — Interval H&P Note (Signed)
History and Physical Interval Note:  09/27/2014 5:00 PM  Jennifer [name]  has presented today for surgery, with the diagnosis of Thoracic Aortic Aneurysm  The various methods of treatment have been discussed with the patient and family. After consideration of risks, benefits and other options for treatment, the patient has consented to  Procedure(s): THORACIC AORTIC ENDOVASCULAR STENT GRAFT (N/A) as a surgical intervention .  The patient's history has been reviewed, patient examined, no change in status, stable for surgery.  I have reviewed the patient's chart and labs.  Questions were answered to the patient's satisfaction.     Jeanean Hollett IV, VAnner [place]. WELLS  Patient with worsening back pain and hypotension reported by PT.  I elected to bring her into the hospital with plans for repair urgently.  She ate at 1pm, but I don't want this to delay he case given her symptoms.  Durene CalWells Yussef Jorge

## 2014-09-27 NOTE — Anesthesia Procedure Notes (Addendum)
Procedure Name: Intubation Date/Time: 09/27/2014 5:52 PM Performed by: Gwenyth AllegraADAMI, Durant Scibilia Pre-anesthesia Checklist: Emergency Drugs available, Timeout performed, Patient identified, Suction available and Patient being monitored Patient Re-evaluated:Patient Re-evaluated prior to inductionOxygen Delivery Method: Circle system utilized Preoxygenation: Pre-oxygenation with 100% oxygen Intubation Type: IV induction Ventilation: Mask ventilation without difficulty Laryngoscope Size: Mac and 3 Grade View: Grade I Tube type: Subglottic suction tube Tube size: 7.5 mm Airway Equipment and Method: Stylet Placement Confirmation: ETT inserted through vocal cords under direct vision,  breath sounds checked- equal and bilateral and positive ETCO2 Secured at: 21 cm Tube secured with: Tape Dental Injury: Teeth and Oropharynx as per pre-operative assessment

## 2014-09-28 ENCOUNTER — Inpatient Hospital Stay (HOSPITAL_COMMUNITY): Admission: RE | Admit: 2014-09-28 | Payer: Medicare Other | Source: Ambulatory Visit

## 2014-09-28 ENCOUNTER — Encounter (HOSPITAL_COMMUNITY): Payer: Self-pay | Admitting: Surgery

## 2014-09-28 LAB — POCT I-STAT 4, (NA,K, GLUC, HGB,HCT)
Glucose, Bld: 100 mg/dL — ABNORMAL HIGH (ref 70–99)
HCT: 23 % — ABNORMAL LOW (ref 36.0–46.0)
HEMOGLOBIN: 7.8 g/dL — AB (ref 12.0–15.0)
Potassium: 3.9 mEq/L (ref 3.7–5.3)
SODIUM: 140 meq/L (ref 137–147)

## 2014-09-28 LAB — BASIC METABOLIC PANEL
Anion gap: 15 (ref 5–15)
BUN: 16 mg/dL (ref 6–23)
CHLORIDE: 105 meq/L (ref 96–112)
CO2: 22 mEq/L (ref 19–32)
Calcium: 9.3 mg/dL (ref 8.4–10.5)
Creatinine, Ser: 0.99 mg/dL (ref 0.50–1.10)
GFR calc non Af Amer: 50 mL/min — ABNORMAL LOW (ref >90)
GFR, EST AFRICAN AMERICAN: 58 mL/min — AB (ref >90)
GLUCOSE: 133 mg/dL — AB (ref 70–99)
POTASSIUM: 3.6 meq/L — AB (ref 3.7–5.3)
Sodium: 142 mEq/L (ref 137–147)

## 2014-09-28 LAB — CBC
HEMATOCRIT: 30.6 % — AB (ref 36.0–46.0)
HEMOGLOBIN: 9.8 g/dL — AB (ref 12.0–15.0)
MCH: 28.6 pg (ref 26.0–34.0)
MCHC: 32 g/dL (ref 30.0–36.0)
MCV: 89.2 fL (ref 78.0–100.0)
Platelets: 191 10*3/uL (ref 150–400)
RBC: 3.43 MIL/uL — ABNORMAL LOW (ref 3.87–5.11)
RDW: 16.2 % — ABNORMAL HIGH (ref 11.5–15.5)
WBC: 8 10*3/uL (ref 4.0–10.5)

## 2014-09-28 LAB — GLUCOSE, CAPILLARY
GLUCOSE-CAPILLARY: 113 mg/dL — AB (ref 70–99)
GLUCOSE-CAPILLARY: 126 mg/dL — AB (ref 70–99)
GLUCOSE-CAPILLARY: 115 mg/dL — AB (ref 70–99)
Glucose-Capillary: 135 mg/dL — ABNORMAL HIGH (ref 70–99)

## 2014-09-28 LAB — HEMOGLOBIN A1C
Hgb A1c MFr Bld: 6.4 % — ABNORMAL HIGH (ref <5.7)
Mean Plasma Glucose: 137 mg/dL — ABNORMAL HIGH (ref <117)

## 2014-09-28 MED ORDER — CETYLPYRIDINIUM CHLORIDE 0.05 % MT LIQD
7.0000 mL | Freq: Two times a day (BID) | OROMUCOSAL | Status: DC
  Administered 2014-09-28 – 2014-10-04 (×11): 7 mL via OROMUCOSAL

## 2014-09-28 NOTE — Progress Notes (Signed)
TCTS BRIEF SICU PROGRESS NOTE  1 Day Post-Op  S/P Procedure(s) (LRB): THORACIC AORTIC ENDOVASCULAR STENT GRAFT (N/A)   Stable day NSR w/ good BP control Adequate UOP  Plan: Continue current plan  Cina Klumpp H 09/28/2014 4:49 PM

## 2014-09-28 NOTE — Care Management Note (Addendum)
    Page 1 of 1   10/04/2014     2:47:15 PM CARE MANAGEMENT NOTE 10/04/2014  Patient:  ELLINA, SIVERTSEN   Account Number:  [id number]  Date Initiated:  09/28/2014  Documentation initiated by:  MAYO,HENRIETTA  Subjective/Objective Assessment:   s/p Endovascular Repair of descending Thoracic aneurysm; lives with spouse    PCP  Dorian Heckle     Action/Plan:   Anticipated DC Date:  10/04/2014   Anticipated DC Plan:  SKILLED NURSING FACILITY  In-house referral  Clinical Social Worker      DC Planning Services  CM consult      Choice offered to / List presented to:             Status of service:  Completed, signed off Medicare Important Message given?  YES (If response is "NO", the following Medicare IM given date fields will be blank) Date Medicare IM given:  10/03/2014 Medicare IM given by:  Jason Hauge Date Additional Medicare IM given:   Additional Medicare IM given by:    Discharge Disposition:  [place]  Per UR Regulation:  Reviewed for med. necessity/level of care/duration of stay  If discussed at [place] of Stay Meetings, dates discussed:   10/04/2014    Comments:  10/04/14 Ellan Lambert, RN, BSN [phone] Pt to dc to SNF today pending insurance auth, per CSW arrangements.  10/03/14 Ellan Lambert, RN, BSN [phone] MD/PA state pt medically ready for dc on 11/3.  Met with pt and spouse to finalize dc plans.  Husband states daughter is coming in from [place] on Thursday, and will be able to provide assistance for pt at home.  He is aware that pt cannot remain in hospital until daughter gets here, and feels that pt should go back to [facility], where she had a recent stay for rehab.  Pt is agreeable to this plan. CSW made aware of need for SNF at dc, and likely dc on 11/3.  Will follow progress.

## 2014-09-28 NOTE — Progress Notes (Addendum)
Pt BP currently 125/58 per nursing report from day shift RN Irving Burton(Emily),  Pt SBP are to be maintained between 140-150's. On call MD notified and aware of decrease in SBP. Pt continues to moves all lower ext., no complaints of numbness or tingling, will continue to monitor.

## 2014-09-28 NOTE — Progress Notes (Signed)
1 Day Post-Op Procedure(s) (LRB): THORACIC AORTIC ENDOVASCULAR STENT GRAFT (N/A) Subjective:  Still has some pain across upper back but mild. Some pain in left groin  Objective: Vital signs in last 24 hours: Temp:  [97.5 F (36.4 C)-98.3 F (36.8 C)] 98.3 F (36.8 C) (10/28 0700) Pulse Rate:  [58-91] 91 (10/28 0800) Cardiac Rhythm:  [-] Normal sinus rhythm (10/28 0600) Resp:  [11-28] 22 (10/28 0800) BP: (129-192)/(60-98) 158/61 mmHg (10/28 0800) SpO2:  [94 %-100 %] 95 % (10/28 0800) Arterial Line BP: (152-209)/(52-126) 186/64 mmHg (10/28 0700) Weight:  [64.864 kg (143 lb)-65.1 kg (143 lb 8.3 oz)] 65.1 kg (143 lb 8.3 oz) (10/28 0500)  ICP 11-14 CPP 70-90  Hemodynamic parameters for last 24 hours:    Intake/Output from previous day: 10/27 0701 - 10/28 0700 In: 3005 [P.O.:180; I.V.:2075; Blood:350; IV Piggyback:400] Out: 2480 [Urine:2475; Drains:5] Intake/Output this shift: Total I/O In: -  Out: 107 [Urine:100; Drains:7]  General appearance: alert and cooperative Neurologic: intact. Good strength in both legs, sensation intact Heart: regular rate and rhythm, S1, S2 normal, no murmur, click, rub or gallop Lungs: diminished breath sounds bibasilar Abdomen: soft, non-tender; bowel sounds normal; no masses,  no organomegaly Extremities: extremities normal, atraumatic, no cyanosis or edema Wound: left groin incision ok  Lab Results:  Recent Labs  09/27/14 2145 09/28/14 0500  WBC 6.1 8.0  HGB 9.4* 9.8*  HCT 29.5* 30.6*  PLT 187 191   BMET:  Recent Labs  09/27/14 2145 09/28/14 0500  NA 139 142  K 4.1 3.6*  CL 105 105  CO2 20 22  GLUCOSE 116* 133*  BUN 20 16  CREATININE 1.13* 0.99  CALCIUM 9.3 9.3    PT/INR:  Recent Labs  09/27/14 2145  LABPROT 15.7*  INR 1.24   ABG    Component Value Date/Time   PHART 7.382 09/01/2014 1622   HCO3 25.6* 09/01/2014 1622   TCO2 27 09/01/2014 1622   ACIDBASEDEF 1.5 07/30/2010 1230   O2SAT 99.0 09/01/2014 1622   CBG  (last 3)   Recent Labs  09/27/14 1654 09/27/14 2132  GLUCAP 113* 121*   Post-op CXR showed bibasilar atelectasis left greater than right.  Assessment/Plan: S/P Procedure(s) (LRB): THORACIC AORTIC ENDOVASCULAR STENT GRAFT (N/A)  She has done well overnight.  Minimal drainage from spinal drain and I agree with capping it today and following her neuro exam and plan to remove it in the am.  She has been hypertensive. Will aim for SBP 140's To maximize CPP but minimize risk of further intramural aortic hematoma or dissection.  OOB to chair.l   LOS: 1 day    BARTLE,BRYAN K 09/28/2014

## 2014-09-28 NOTE — Progress Notes (Addendum)
  [facility] Progress Note  09/28/2014 7:36 AM 1 Day Post-Op  Subjective:  Having soreness in middle back.   Tmax 98.2 BP cuff 120s-170s 02 95% 2L  Filed Vitals:   09/28/14 0700  BP: 149/83  Pulse: 87  Temp:   Resp: 16    Physical Exam: General: Alert and oriented, resting in bed in NAD Incisions:  Left groin soft without hematoma Extremities:  2+ radial pulses bilaterally. Feet are warm. Moving all extremities, 5/5 strength upper and lower extremities Cardiac: regular, nml s1 s2 Lungs: clear to auscultation anterior Abdomen: soft, no distension, mild tenderness of left lateral abdomen   CBC    Component Value Date/Time   WBC 8.0 09/28/2014 0500   RBC 3.43* 09/28/2014 0500   HGB 9.8* 09/28/2014 0500   HCT 30.6* 09/28/2014 0500   PLT 191 09/28/2014 0500   MCV 89.2 09/28/2014 0500   MCH 28.6 09/28/2014 0500   MCHC 32.0 09/28/2014 0500   RDW 16.2* 09/28/2014 0500   LYMPHSABS 1.6 08/28/2014 0844   MONOABS 0.6 08/28/2014 0844   EOSABS 0.0 08/28/2014 0844   BASOSABS 0.0 08/28/2014 0844    BMET    Component Value Date/Time   NA 142 09/28/2014 0500   K 3.6* 09/28/2014 0500   CL 105 09/28/2014 0500   CO2 22 09/28/2014 0500   GLUCOSE 133* 09/28/2014 0500   BUN 16 09/28/2014 0500   CREATININE 0.99 09/28/2014 0500   CALCIUM 9.3 09/28/2014 0500   GFRNONAA 50* 09/28/2014 0500   GFRAA 58* 09/28/2014 0500    INR    Component Value Date/Time   INR 1.24 09/27/2014 2145     Intake/Output Summary (Last 24 hours) at 09/28/14 0736 Last data filed at 09/28/14 0700  Gross per 24 hour  Intake   3005 ml  Output   2480 ml  Net    525 ml     Assessment:  80 y.o. female is s/p: endovascular stent graft repair of thoracic aortic aneurysm  1 Day Post-Op  Plan: - -BP systolic 120s-170s. Currently 149/83 right arm. 162/67 left arm.  Has been responding well to hydralazine. A-line has been running 30 points higher. D/C a-line.  -Neurologically intact.  Moving all extremities equally.  -Lumbar drain: 5 mL output. ICP mean 9-14.   Will discuss parameters and length of therapy with Dr. Myra GianottiBrabham.  -Good UOP -DVT prophylaxis:  Lovenox  -Dispo: will discuss with Dr. Vanna ScotlandBrabham.     Kimberly Trinh, PA-C [facility] Office: [phone] Pager: [phone] 09/28/2014 7:36 AM     I agree with the above.  POD#1, s/p TEVAR Spinal drain with minimal output.  Normal neuro exam.  Will cap drain today.  If she does well, will d./c tomorrow Left groin soft but tender.  Extremities with pulses OOB today Cr good today after IV dye for case Acute blood loss anemia.  Hct 30 after 2 u PRBC in OR  WElls Johnston Maddocks

## 2014-09-28 NOTE — Op Note (Signed)
Jennifer MusselJanie B [name]  [id number]  CARDIOVASCULAR SURGERY OPERATIVE NOTE   09/28/2014  Surgeon: Alleen BorneBryan K. Bartle, MD   Co-Surgeon: Juleen ChinaV. Wells Brabham IV   Preoperative Diagnosis: Symptomatic 5.6 cm descending thoracic aortic aneurysm  Postoperative Diagnosis: Same   Procedure:  1: Endovascular repair of descending thoracic aortic aneurysm. 2: Distal extension x1  3: Catheter in the thoracic aorta x2  4: Aortic arch angiogram  5: Thoracic aortogram   Anesthesia: General Endotracheal   Clinical History/Surgical Indication:   The patient is an 80 year old woman with diabetes, hypertension and prior AAA repair for a 5.8 cm AAA in 2011 who was admitted  08/28/2014 after she awoke  with new pain in her chest and back that did not improve. She had a CTA which showed an intramural hematoma in the descending aorta extending from distal to the left subclavian to the distal descending thoracic aorta. There were 2 areas of wall enhancement within the thickened hyperdense wall suspicious for ongoing intramural hemorrhage or area of small ulcerated plaque formation. There was also a fusiform atherosclerotic aneurysm in the mid descending aorta with a maximum diameter of 5.2 cm. She was hypertensive on presentation at 179/102. Her BP was controlled and her back pain resolved but she continued to have some intermittent chest pain. She decompensated a few days later on 10/1 with hypoxemia and shortness of breath and was found to have pulmonary edema on CXR. She was placed on BiPAP and diuresed with use of dopamine and then levophed to support her BP. She improved and a repeat CT of the chest was unchanged. It was not completely clear why she decompensated but it was felt to most likely be diastolic heart failure. She underwent of nuclear stress test that was a low risk study with a normal EF of 73%. I saw her shortly after admission and discussed her with Dr. Myra GianottiBrabham and we felt that she would benefit from TEVAR for her  thoracic aortic aneurysm but should wait a month or so for her aortic intramural hematoma to heal. She was discharged and seen back in the office on Monday by Dr. Myra GianottiBrabham reporting back pain. A repeat chest CT showed that there was continued improvement in the intramural hematoma but the aneurysm had increased to 5.6 cm. She was scheduled for elective repair this Thursday but called his office Tuesday with worsening back pain and a BP in the 80's. Therefore she was brought in for urgent repair.    Preparation:   The patient was seen in the preoperative holding area by Dr. Myra GianottiBrabham and the correct patient, correct operation were confirmed with the patient after reviewing. Preoperative antibiotics were given. A right internal jugular central line and right radial arterial line were placed by the anesthesia team. The patient was taken back to the operating room and positioned supine on the operating room table. After being placed under general endotracheal anesthesia by the anesthesia team a foley catheter was placed. A spinal drain was placed by anesthesia. The neck, chest, abdomen, and both legs were prepped with betadine soap and solution and draped in the usual sterile manner. A surgical time-out was taken and the correct patient and operative procedure were confirmed with the nursing and anesthesia staff.    Insertion of endovascular graft:    The left common femoral artery was visualized with ultrasound and cannulated with an 18-gauge needle followed by a Teena DunkBenson wire. Two ProGlide closure devices were placed at 11:00 and 1:00. An 8-F sheath was  placed and a pigtail catheter was advanced with the wire into the ascending aorta. The [facility] wire was exchanged for a double curved Lunderquist wire. A 24 F dry seal sheath was inserted over the wire into the aorta. The proximal graft was a Gore TAG 34 x 20 cm device. This was advanced into the descending thoracic aorta. Then a Teena DunkBenson wire was inserted through  the dry seal sheath beside the device and a pigtail catheter was inserted over the wire and advanced into the aorta. An aortic arch arteriogram was performed to localize the innominate and left common carotid arteries. The proximal graft was positioned and deployed in satisfactory position but the device retracted backwards slightly due to the tortuosity of the aorta. We discussed the proximal end and felt that a proximal extension would be best. The pigtail was removed. Then the proximal extension graft was prepared. This was a 34 x 10 Gore TAG. This was advanced inside the proximal graft with significant difficulty due to the tortuosity of her aorta. It was not possible to advance this extension proximal to the first device after multiple maneuvers. An aortogram showed no proximal type IA endoleak and therefore we decided that it would be best to leave the proximal end as is and use the extension distally. The device was pulled back into position distally with a 5 cm overlap and deployed. The overlap was slightly narrowed due to the bend in the aorta and therefore the overlap was molded with a Gore Tri-lobe balloon with a good result. A completion aortogram showed complete exclusion of the aneurysm with no endoleak.   The catheters were removed followed by the sheath, leaving the wire. The ProGlide closure devices were tied and there was good hemostasis. Protamine was given. The skin was closed with 4-0 Vicryl suture. Dermabond was applied. All sponge, needle, and instrument counts were reported correct at the end of the case. The patient was awakened, extubated and transported to the PACU in hemodynamically stable and satisfactory condition. She was moving all 4 extremities at the conclusion of surgery with doppler signals present in her feet.   Alleen BorneBryan K. Bartle, MD

## 2014-09-29 ENCOUNTER — Inpatient Hospital Stay (HOSPITAL_COMMUNITY): Payer: Medicare Other

## 2014-09-29 LAB — CBC
HEMATOCRIT: 27.5 % — AB (ref 36.0–46.0)
Hemoglobin: 8.7 g/dL — ABNORMAL LOW (ref 12.0–15.0)
MCH: 27.8 pg (ref 26.0–34.0)
MCHC: 31.6 g/dL (ref 30.0–36.0)
MCV: 87.9 fL (ref 78.0–100.0)
Platelets: 169 10*3/uL (ref 150–400)
RBC: 3.13 MIL/uL — ABNORMAL LOW (ref 3.87–5.11)
RDW: 16.8 % — ABNORMAL HIGH (ref 11.5–15.5)
WBC: 10.4 10*3/uL (ref 4.0–10.5)

## 2014-09-29 LAB — GLUCOSE, CAPILLARY
GLUCOSE-CAPILLARY: 104 mg/dL — AB (ref 70–99)
GLUCOSE-CAPILLARY: 140 mg/dL — AB (ref 70–99)
GLUCOSE-CAPILLARY: 97 mg/dL (ref 70–99)
Glucose-Capillary: 148 mg/dL — ABNORMAL HIGH (ref 70–99)
Glucose-Capillary: 134 mg/dL — ABNORMAL HIGH (ref 70–99)

## 2014-09-29 LAB — BASIC METABOLIC PANEL
Anion gap: 14 (ref 5–15)
BUN: 15 mg/dL (ref 6–23)
CALCIUM: 9.3 mg/dL (ref 8.4–10.5)
CO2: 21 mEq/L (ref 19–32)
Chloride: 101 mEq/L (ref 96–112)
Creatinine, Ser: 1.02 mg/dL (ref 0.50–1.10)
GFR, EST AFRICAN AMERICAN: 56 mL/min — AB (ref >90)
GFR, EST NON AFRICAN AMERICAN: 48 mL/min — AB (ref >90)
GLUCOSE: 133 mg/dL — AB (ref 70–99)
Potassium: 4 mEq/L (ref 3.7–5.3)
SODIUM: 136 meq/L — AB (ref 137–147)

## 2014-09-29 MED ORDER — IOHEXOL 350 MG/ML SOLN
100.0000 mL | Freq: Once | INTRAVENOUS | Status: AC | PRN
  Administered 2014-09-29: 100 mL via INTRAVENOUS

## 2014-09-29 MED ORDER — FUROSEMIDE 10 MG/ML IJ SOLN
40.0000 mg | Freq: Once | INTRAMUSCULAR | Status: AC
  Administered 2014-09-29: 40 mg via INTRAVENOUS
  Filled 2014-09-29: qty 4

## 2014-09-29 NOTE — Progress Notes (Signed)
2 Days Post-Op Procedure(s) (LRB): THORACIC AORTIC ENDOVASCULAR STENT GRAFT (N/A) Subjective:  No complaints. Says she has no back pain this am.  Objective: Vital signs in last 24 hours: Temp:  [98.3 F (36.8 C)-99.3 F (37.4 C)] 98.8 F (37.1 C) (10/29 0348) Pulse Rate:  [74-91] 78 (10/29 0700) Cardiac Rhythm:  [-] Normal sinus rhythm (10/29 0400) Resp:  [0-29] 28 (10/29 0700) BP: (125-169)/(53-77) 155/69 mmHg (10/29 0700) SpO2:  [91 %-96 %] 95 % (10/29 0700) Arterial Line BP: (181-199)/(64-67) 197/65 mmHg (10/28 1000)  Hemodynamic parameters for last 24 hours:    Intake/Output from previous day: 10/28 0701 - 10/29 0700 In: 1490 [P.O.:240; I.V.:1200; IV Piggyback:50] Out: 947 [Urine:940; Drains:7] Intake/Output this shift:    General appearance: alert and cooperative Neurologic: intact and strength in legs equal bilat Heart: regular rate and rhythm, S1, S2 normal, no murmur, click, rub or gallop Lungs: clear to auscultation bilaterally Abdomen: soft, non-tender; bowel sounds normal; no masses,  no organomegaly Extremities: extremities normal, atraumatic, no cyanosis or edema and feet warm Wound: left groin incision soft, nontender  Lab Results:  Recent Labs  09/28/14 0500 09/29/14 0357  WBC 8.0 10.4  HGB 9.8* 8.7*  HCT 30.6* 27.5*  PLT 191 169   BMET:  Recent Labs  09/28/14 0500 09/29/14 0029  NA 142 136*  K 3.6* 4.0  CL 105 101  CO2 22 21  GLUCOSE 133* 133*  BUN 16 15  CREATININE 0.99 1.02  CALCIUM 9.3 9.3    PT/INR:  Recent Labs  09/27/14 2145  LABPROT 15.7*  INR 1.24   ABG    Component Value Date/Time   PHART 7.382 09/01/2014 1622   HCO3 25.6* 09/01/2014 1622   TCO2 27 09/01/2014 1622   ACIDBASEDEF 1.5 07/30/2010 1230   O2SAT 99.0 09/01/2014 1622   CBG (last 3)   Recent Labs  09/28/14 1127 09/28/14 1641 09/28/14 2138  GLUCAP 115* 135* 148*    Assessment/Plan: S/P Procedure(s) (LRB): THORACIC AORTIC ENDOVASCULAR STENT GRAFT  (N/A)  She is hemodynamically stable with acceptable BP control at this time on her previous meds.  Remove spinal drain today.  CT chest, abd and pelvis today. If it is ok she can go to 2W.   LOS: 2 days    Tiyah Zelenak K 09/29/2014

## 2014-09-29 NOTE — Progress Notes (Addendum)
  [facility] Progress Note  09/29/2014 8:08 AM 2 Days Post-Op  Subjective:  Denies any back pain.   Tmax 99.3 BP sys 120s-160 02 95% 2L  Filed Vitals:   09/29/14 0757  BP:   Pulse:   Temp: 99.2 F (37.3 C)  Resp:     Physical Exam: Incisions:  Left groin soft without hematoma Extremities:  Moving all extremities equally. Feet are warm and well-perfused. Cardiac: regular rate and rhythm. No murmurs Lungs: clear to auscultation anterior Abdomen: + bowel sounds, soft, obese, minor tenderness to left middle quadrant.   CBC    Component Value Date/Time   WBC 10.4 09/29/2014 0357   RBC 3.13* 09/29/2014 0357   HGB 8.7* 09/29/2014 0357   HCT 27.5* 09/29/2014 0357   PLT 169 09/29/2014 0357   MCV 87.9 09/29/2014 0357   MCH 27.8 09/29/2014 0357   MCHC 31.6 09/29/2014 0357   RDW 16.8* 09/29/2014 0357   LYMPHSABS 1.6 08/28/2014 0844   MONOABS 0.6 08/28/2014 0844   EOSABS 0.0 08/28/2014 0844   BASOSABS 0.0 08/28/2014 0844    BMET    Component Value Date/Time   NA 136* 09/29/2014 0029   K 4.0 09/29/2014 0029   CL 101 09/29/2014 0029   CO2 21 09/29/2014 0029   GLUCOSE 133* 09/29/2014 0029   BUN 15 09/29/2014 0029   CREATININE 1.02 09/29/2014 0029   CALCIUM 9.3 09/29/2014 0029   GFRNONAA 48* 09/29/2014 0029   GFRAA 56* 09/29/2014 0029    INR    Component Value Date/Time   INR 1.24 09/27/2014 2145     Intake/Output Summary (Last 24 hours) at 09/29/14 0808 Last data filed at 09/29/14 0700  Gross per 24 hour  Intake   1440 ml  Output    840 ml  Net    600 ml     Assessment:  80 y.o. female is s/p: TEVAR 2 Days Post-Op  Plan: -Neurologically intact. BP has been stable. -Lumbar drain to be removed today.  -Acute blood loss anemia: H/H today 8.7/27/5. Stable. -Creatinine stable. -Will go down for CT chest/abd/pelvis today.  -DVT prophylaxis:  lovenox -Dispo: plan to transfer to 2W if CT scan is ok.    Maris BergerKimberly Trinh, PA-C [facility] Office: [phone] Pager: [phone] 09/29/2014 8:08 AM     Back pain better today Remove lumbar drain Neuro intact Start Lovenox after lumbar drain out I have reviewed her CTA.  The graft is in good position with exclusion of the aneurysm.  Pleural effusion   Wells Brabham

## 2014-09-29 NOTE — Progress Notes (Signed)
CT surgery p.m. Rounds  Patient rest comfortably Normal sinus rhythm, blood pressure 145/80 O2 sats marginal 92-94% CT scans done today show good postoperative result with no endoleak--patient does have a moderate left pleural effusion and will order follow-up chest x-ray in a.m. With low O2 sats will give 1 dose of Lasix this p.m., BUN creatinine normal today. Extremities warm

## 2014-09-30 ENCOUNTER — Inpatient Hospital Stay (HOSPITAL_COMMUNITY): Payer: Medicare Other

## 2014-09-30 LAB — CBC
HCT: 27.9 % — ABNORMAL LOW (ref 36.0–46.0)
HEMATOCRIT: 26.6 % — AB (ref 36.0–46.0)
HEMATOCRIT: 24.8 % — AB (ref 36.0–46.0)
HEMOGLOBIN: 8.4 g/dL — AB (ref 12.0–15.0)
HEMOGLOBIN: 7.8 g/dL — AB (ref 12.0–15.0)
Hemoglobin: 8.8 g/dL — ABNORMAL LOW (ref 12.0–15.0)
MCH: 27.8 pg (ref 26.0–34.0)
MCH: 27.9 pg (ref 26.0–34.0)
MCH: 27.8 pg (ref 26.0–34.0)
MCHC: 31.5 g/dL (ref 30.0–36.0)
MCHC: 31.6 g/dL (ref 30.0–36.0)
MCHC: 31.5 g/dL (ref 30.0–36.0)
MCV: 88.3 fL (ref 78.0–100.0)
MCV: 88.4 fL (ref 78.0–100.0)
MCV: 88.3 fL (ref 78.0–100.0)
PLATELETS: 170 10*3/uL (ref 150–400)
PLATELETS: 172 10*3/uL (ref 150–400)
Platelets: 172 10*3/uL (ref 150–400)
RBC: 3.16 MIL/uL — ABNORMAL LOW (ref 3.87–5.11)
RBC: 3.01 MIL/uL — AB (ref 3.87–5.11)
RBC: 2.81 MIL/uL — AB (ref 3.87–5.11)
RDW: 16.6 % — ABNORMAL HIGH (ref 11.5–15.5)
RDW: 16.4 % — ABNORMAL HIGH (ref 11.5–15.5)
RDW: 16.6 % — ABNORMAL HIGH (ref 11.5–15.5)
WBC: 10 10*3/uL (ref 4.0–10.5)
WBC: 10.9 10*3/uL — AB (ref 4.0–10.5)
WBC: 10.1 10*3/uL (ref 4.0–10.5)

## 2014-09-30 LAB — GLUCOSE, CAPILLARY
GLUCOSE-CAPILLARY: 116 mg/dL — AB (ref 70–99)
Glucose-Capillary: 131 mg/dL — ABNORMAL HIGH (ref 70–99)
Glucose-Capillary: 144 mg/dL — ABNORMAL HIGH (ref 70–99)
Glucose-Capillary: 129 mg/dL — ABNORMAL HIGH (ref 70–99)

## 2014-09-30 MED ORDER — LISINOPRIL 10 MG PO TABS
10.0000 mg | ORAL_TABLET | Freq: Every day | ORAL | Status: DC
  Administered 2014-09-30 – 2014-10-04 (×5): 10 mg via ORAL
  Filled 2014-09-30 (×5): qty 1

## 2014-09-30 MED ORDER — SODIUM CHLORIDE 0.9 % IV BOLUS (SEPSIS)
500.0000 mL | Freq: Once | INTRAVENOUS | Status: AC
  Administered 2014-09-30: 500 mL via INTRAVENOUS

## 2014-09-30 NOTE — Addendum Note (Signed)
Addendum created 09/30/14 1812 by Kennieth Radobert E Zola Runion, MD   Modules edited: Clinical Notes   Clinical Notes:  File: [id number]; Pend: [phone]

## 2014-09-30 NOTE — Progress Notes (Signed)
3 Days Post-Op Procedure(s) (LRB): THORACIC AORTIC ENDOVASCULAR STENT GRAFT (N/A) Subjective: Complains of lower abdominal pain when she drinks cold water.  Objective: Vital signs in last 24 hours: Temp:  [98.1 F (36.7 C)-99.2 F (37.3 C)] 98.3 F (36.8 C) (10/30 0751) Pulse Rate:  [76-87] 80 (10/30 0700) Cardiac Rhythm:  [-] Normal sinus rhythm (10/30 0400) Resp:  [21-28] 23 (10/30 0700) BP: (123-166)/(44-79) 160/44 mmHg (10/30 0700) SpO2:  [90 %-96 %] 96 % (10/30 0700)  Hemodynamic parameters for last 24 hours:    Intake/Output from previous day: 10/29 0701 - 10/30 0700 In: 1740 [P.O.:540; I.V.:1200] Out: 2610 [Urine:2610] Intake/Output this shift:    General appearance: alert and cooperative Neurologic: intact Heart: regular rate and rhythm, S1, S2 normal, no murmur, click, rub or gallop Lungs: clear to auscultation bilaterally Abdomen: soft, non-tender; bowel sounds normal; no masses,  no organomegaly Extremities: extremities normal, atraumatic, no cyanosis or edema Wound: left groin incision ok  Lab Results:  Recent Labs  09/29/14 0357 09/30/14 0500  WBC 10.4 10.0  HGB 8.7* 8.8*  HCT 27.5* 27.9*  PLT 169 172   BMET:  Recent Labs  09/28/14 0500 09/29/14 0029  NA 142 136*  K 3.6* 4.0  CL 105 101  CO2 22 21  GLUCOSE 133* 133*  BUN 16 15  CREATININE 0.99 1.02  CALCIUM 9.3 9.3    PT/INR:  Recent Labs  09/27/14 2145  LABPROT 15.7*  INR 1.24   ABG    Component Value Date/Time   PHART 7.382 09/01/2014 1622   HCO3 25.6* 09/01/2014 1622   TCO2 27 09/01/2014 1622   ACIDBASEDEF 1.5 07/30/2010 1230   O2SAT 99.0 09/01/2014 1622   CBG (last 3)   Recent Labs  09/29/14 1646 09/29/14 2154 09/30/14 0734  GLUCAP 140* 97 116*    Assessment/Plan: S/P Procedure(s) (LRB): THORACIC AORTIC ENDOVASCULAR STENT GRAFT (N/A)  She remains hemodynamically stable but BP still a little high. Will start lisinopril since her creat is normal.  CT reviewed  yesterday and the aneurysm is excluded with no endoleak. 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 There is a small to moderate left pleural effusion that is probably sympathetic due to the aortic intramural hematoma. This should gradually resolve.  We are still waiting for anesthesia to remove the spinal drain. This was requested from anesthesia multiple times yesterday and last night and no one ever saw the patient. I will discuss this with them again today.   Remove sleeve and mobilize once the drain is out. She can go to 2W.   LOS: 3 days    BARTLE,BRYAN K 09/30/2014

## 2014-09-30 NOTE — Progress Notes (Signed)
Brief progress note:  Pt seen at bedside for removal of lumbar drain. 12hrs since last dose of once daily lovenox 30mg , plts 170. No additional anticoagulation given during the day [facility] and verified by SICU RN. Lumbar drain catheter removed tip intact without complications. Pt able to move legs before and after catheter removal.   Marcene Duosobert Dera Vanaken, MD

## 2014-09-30 NOTE — Progress Notes (Signed)
Patient ID: Jennifer MusselJanie B Gilpatrick, female   DOB: 02/01/1925, 80 y.o.   MRN: [id number]  SICU Evening Rounds:  Her BP has drifted downward this afternoon from 160 this am to 84/39 at 2 pm. She was also complaining of 10/10 left lower abdominal pain that has since resolved with oxy IR. She also developed hypoxemia and is now on 55% VM with sats 90%. Repeat Hgb minimally lower to 8.4.  She is comfortable now. Lungs have decreased BS in both lower lobes Cardiac exam: RRR , No murmur. Abd exam: bowel sounds normal, soft and nontender Left groin access site soft with no hematoma.  I am not sure why her BP dropped so much. She is usually hypertensive. With her LLQ abdominal pain she could have had a retroperitoneal bleed but Hgb is about the same as this am and pain resolved. Will recheck tonight.  If she develops any more pain I would scan her abdomen.  Hypoxemia is probably due to atelectasis and left effusion.   She still has the spinal drain in place because she received low dose lovenox this am for DVT prophylaxis and anesthesia will not remove it until tonight.

## 2014-09-30 NOTE — Progress Notes (Signed)
  [facility] Progress Note  09/30/2014 7:40 AM 3 Days Post-Op  Subjective:  Denies back pain, chest pain and shortness of breath. Having abdominal pain when "drinking cold water."   Tmax 99.2 BP sys 120s-160s 02 96% 2L  Filed Vitals:   09/30/14 0700  BP: 160/44  Pulse: 80  Temp:   Resp: 23    Physical Exam: General: resting in bed in no acute distress Incisions:  Left groin soft. No hematoma Extremities:  Moving all extremities equally. Feet are warm and well perfused Cardiac: regular rate and rhythm. No murmurs Lungs: decreased breath sounds on right.  Abdomen: mild diffuse tenderness, soft, bowel sounds present, passing flatus CBC    Component Value Date/Time   WBC 10.0 09/30/2014 0500   RBC 3.16* 09/30/2014 0500   HGB 8.8* 09/30/2014 0500   HCT 27.9* 09/30/2014 0500   PLT 172 09/30/2014 0500   MCV 88.3 09/30/2014 0500   MCH 27.8 09/30/2014 0500   MCHC 31.5 09/30/2014 0500   RDW 16.6* 09/30/2014 0500   LYMPHSABS 1.6 08/28/2014 0844   MONOABS 0.6 08/28/2014 0844   EOSABS 0.0 08/28/2014 0844   BASOSABS 0.0 08/28/2014 0844    BMET    Component Value Date/Time   NA 136* 09/29/2014 0029   K 4.0 09/29/2014 0029   CL 101 09/29/2014 0029   CO2 21 09/29/2014 0029   GLUCOSE 133* 09/29/2014 0029   BUN 15 09/29/2014 0029   CREATININE 1.02 09/29/2014 0029   CALCIUM 9.3 09/29/2014 0029   GFRNONAA 48* 09/29/2014 0029   GFRAA 56* 09/29/2014 0029    INR    Component Value Date/Time   INR 1.24 09/27/2014 2145     Intake/Output Summary (Last 24 hours) at 09/30/14 0740 Last data filed at 09/30/14 0700  Gross per 24 hour  Intake   1740 ml  Output   2610 ml  Net   -870 ml     Assessment:  80 y.o. female is s/p: TEVAR 3 Days Post-Op  Plan: -Follow up CXR showing small left pleural effusion and minor effusion on right.  -02 sats improved after dose of lasix yesterday evening.  -BP systolic 160s. Started on lisinopril.  -Good UOP.  -Spinal  drain still in place. Will have anesthesia remove today.  -DVT prophylaxis: lovenox on hold, SCDs -Will transfer to 2W after spinal drain removed.    Maris BergerKimberly Dakari Stabler, PA-C [facility] Office: [phone] Pager: [phone] 09/30/2014 7:40 AM

## 2014-09-30 NOTE — Progress Notes (Signed)
Kim [facility] PA paged. Pt started having 10/10 left mid to lower abdominal pain. 2 Percocet was give at 1400. No relief from PRN. Pt was sitting in chair at time of pain starting. Once pain meds given pt was transferred back to the bed. Kim page when no relief was achieved. Pt Bp now 80s/40s, SR in 60s. sats dropped to upper 80s on 2L [place]. Pt placed on venti mask. Stat CBC ordered, and 500cc NS bolus. Will cont. To monitor and assess patient.

## 2014-10-01 ENCOUNTER — Inpatient Hospital Stay (HOSPITAL_COMMUNITY): Payer: Medicare Other

## 2014-10-01 LAB — CBC
HEMATOCRIT: 26.5 % — AB (ref 36.0–46.0)
HEMOGLOBIN: 8.3 g/dL — AB (ref 12.0–15.0)
MCH: 27.8 pg (ref 26.0–34.0)
MCHC: 31.3 g/dL (ref 30.0–36.0)
MCV: 88.6 fL (ref 78.0–100.0)
Platelets: 180 10*3/uL (ref 150–400)
RBC: 2.99 MIL/uL — ABNORMAL LOW (ref 3.87–5.11)
RDW: 16.5 % — ABNORMAL HIGH (ref 11.5–15.5)
WBC: 8.9 10*3/uL (ref 4.0–10.5)

## 2014-10-01 LAB — BASIC METABOLIC PANEL
Anion gap: 13 (ref 5–15)
BUN: 25 mg/dL — AB (ref 6–23)
CO2: 23 mEq/L (ref 19–32)
CREATININE: 0.9 mg/dL (ref 0.50–1.10)
Calcium: 9.3 mg/dL (ref 8.4–10.5)
Chloride: 105 mEq/L (ref 96–112)
GFR calc Af Amer: 65 mL/min — ABNORMAL LOW (ref >90)
GFR, EST NON AFRICAN AMERICAN: 56 mL/min — AB (ref >90)
GLUCOSE: 99 mg/dL (ref 70–99)
Potassium: 3.9 mEq/L (ref 3.7–5.3)
Sodium: 141 mEq/L (ref 137–147)

## 2014-10-01 LAB — GLUCOSE, CAPILLARY
GLUCOSE-CAPILLARY: 140 mg/dL — AB (ref 70–99)
Glucose-Capillary: 106 mg/dL — ABNORMAL HIGH (ref 70–99)
Glucose-Capillary: 125 mg/dL — ABNORMAL HIGH (ref 70–99)

## 2014-10-01 LAB — TYPE AND SCREEN
ABO/RH(D): O POS
Antibody Screen: NEGATIVE
UNIT DIVISION: 0
Unit division: 0

## 2014-10-01 MED ORDER — LABETALOL HCL 200 MG PO TABS
200.0000 mg | ORAL_TABLET | Freq: Three times a day (TID) | ORAL | Status: DC
  Administered 2014-10-01 (×3): 200 mg via ORAL
  Filled 2014-10-01 (×6): qty 1

## 2014-10-01 MED ORDER — SODIUM CHLORIDE 0.9 % IJ SOLN
3.0000 mL | Freq: Two times a day (BID) | INTRAMUSCULAR | Status: DC
  Administered 2014-10-02 – 2014-10-04 (×5): 3 mL via INTRAVENOUS

## 2014-10-01 MED ORDER — FERROUS GLUCONATE 324 (38 FE) MG PO TABS
324.0000 mg | ORAL_TABLET | Freq: Two times a day (BID) | ORAL | Status: DC
  Administered 2014-10-01 – 2014-10-04 (×8): 324 mg via ORAL
  Filled 2014-10-01 (×11): qty 1

## 2014-10-01 MED ORDER — SODIUM CHLORIDE 0.9 % IJ SOLN
3.0000 mL | INTRAMUSCULAR | Status: DC | PRN
  Administered 2014-10-02 – 2014-10-03 (×2): 3 mL via INTRAVENOUS
  Filled 2014-10-01 (×2): qty 3

## 2014-10-01 NOTE — Progress Notes (Signed)
4 Days Post-Op Procedure(s) (LRB): THORACIC AORTIC ENDOVASCULAR STENT GRAFT (N/A) Subjective:  No more abdominal pain  Up in chair eating breakfast. Max 2 person assist.  Objective: Vital signs in last 24 hours: Temp:  [98.1 F (36.7 C)-100.2 F (37.9 C)] 98.1 F (36.7 C) (10/31 0900) Pulse Rate:  [62-80] 73 (10/31 0800) Cardiac Rhythm:  [-] Normal sinus rhythm (10/31 0800) Resp:  [14-32] 22 (10/31 0800) BP: (77-153)/(28-68) 151/66 mmHg (10/31 0800) SpO2:  [87 %-97 %] 97 % (10/31 0800) FiO2 (%):  [40 %-55 %] 55 % (10/30 1645) Weight:  [63.141 kg (139 lb 3.2 oz)] 63.141 kg (139 lb 3.2 oz) (10/31 0630)  Hemodynamic parameters for last 24 hours:    Intake/Output from previous day: 10/30 0701 - 10/31 0700 In: 1540 [P.O.:400; I.V.:640; IV Piggyback:500] Out: 1180 [Urine:1180] Intake/Output this shift: Total I/O In: 40 [I.V.:40] Out: -   General appearance: alert and cooperative Neurologic: intact Heart: regular rate and rhythm, S1, S2 normal, no murmur, click, rub or gallop Lungs: diminished breath sounds LLL Abdomen: soft, non-tender; bowel sounds normal; no masses,  no organomegaly Extremities: extremities normal, atraumatic, no cyanosis or edema Wound: left groin site ok  Lab Results:  Recent Labs  09/30/14 2105 10/01/14 0405  WBC 10.1 8.9  HGB 7.8* 8.3*  HCT 24.8* 26.5*  PLT 172 180   BMET:  Recent Labs  09/29/14 0029 10/01/14 0405  NA 136* 141  [name] 4.0 3.9  CL 101 105  CO2 21 23  GLUCOSE 133* 99  BUN 15 25*  CREATININE 1.02 0.90  CALCIUM 9.3 9.3    PT/INR: No results found for this basename: LABPROT, INR,  in the last 72 hours ABG    Component Value Date/Time   PHART 7.382 09/01/2014 1622   HCO3 25.6* 09/01/2014 1622   TCO2 27 09/01/2014 1622   ACIDBASEDEF 1.5 07/30/2010 1230   O2SAT 99.0 09/01/2014 1622   CBG (last 3)   Recent Labs  09/30/14 1528 09/30/14 2153 10/01/14 0857  GLUCAP 144* 129* 106*   CLINICAL DATA: Pleural effusion.   EXAM:  PORTABLE CHEST - 1 VIEW  COMPARISON: 09/30/2014 and 09/27/2014 and CT scan dated 09/29/2014  FINDINGS:  Sheath remains in the upper superior vena cava.  Persistent slight atelectasis at the right lung base with a small  effusion.  Slight improvement in the atelectasis and effusion at the left base.  Overall heart size is normal. Vascularity is normal.  IMPRESSION:  Decreased atelectasis and effusion at the left base. No other  change.  Electronically Signed  By: Geanie CooleyJim Maxwell M.D.  On: 10/01/2014 06:04  Assessment/Plan: S/P Procedure(s) (LRB): THORACIC AORTIC ENDOVASCULAR STENT GRAFT (N/A)  Spinal drain taken out last pm  Her BP is back up today. I am not sure why it dropped yesterday afternoon. She was previously on a fair amount of medicine to keep her BP under control. I will stop the Plendil and reduce her labetalol, continue low dose lisinopril and follow.  Acute blood loss anemia with surgery: Hgb is fairly stable. Will start Fe2+  Deconditioning: will consult PT.  Small to moderate left pleural effusion and LLL atelectasis improved on CXR today. Continue IS.  LOS: 4 days    Jennifer [name] 10/01/2014

## 2014-10-01 NOTE — Progress Notes (Signed)
Patient ID: [name], female   DOB: 06/13/1925, 80 y.o.   MRN: [id number] Vascular Surgery Progress Note  Subjective: 4 days post TEVAR--severe pain in left inguinal area has resolved. Lumbar spinal drain removed last p.m. Patient sitting up in chair Soft blood pressures from yesterday afternoon have resolved and blood pressure now in the 120-50 systolic range-no specific complaints this a.m.  Objective:  Filed Vitals:   10/01/14 0800  BP: 151/66  Pulse: 73  Temp:   Resp: 22    General alert and oriented 3 sitting in chair Lungs no rhonchi or wheezing Abdomen soft and left inguinal area soft with no evidence of significant hematoma   Labs:  Recent Labs Lab 09/28/14 0500 09/29/14 0029 10/01/14 0405  CREATININE 0.99 1.02 0.90    Recent Labs Lab 09/28/14 0500 09/29/14 0029 10/01/14 0405  NA 142 136* 141  K 3.6* 4.0 3.9  CL 105 101 105  CO2 22 21 23   BUN 16 15 25*  CREATININE 0.99 1.02 0.90  GLUCOSE 133* 133* 99  CALCIUM 9.3 9.3 9.3    Recent Labs Lab 09/30/14 1520 09/30/14 2105 10/01/14 0405  WBC 10.9* 10.1 8.9  HGB 8.4* 7.8* 8.3*  HCT 26.6* 24.8* 26.5*  PLT 170 172 180    Recent Labs Lab 09/27/14 1630 09/27/14 2145  INR 1.20 1.24    I/O last 3 completed shifts: In: 2320 [P.O.:580; I.V.:1240; IV Piggyback:500] Out: 3105 [Urine:3105]  Imaging: Dg Chest Port 1 View  10/01/2014   CLINICAL DATA:  Pleural effusion.  EXAM: PORTABLE CHEST - 1 VIEW  COMPARISON:  09/30/2014 and 09/27/2014 and CT scan dated 09/29/2014  FINDINGS: Sheath remains in the upper superior vena cava.  Persistent slight atelectasis at the right lung base with a small effusion.  Slight improvement in the atelectasis and effusion at the left base. Overall heart size is normal. Vascularity is normal.  IMPRESSION: Decreased atelectasis and effusion at the left base. No other change.   Electronically Signed   By: Geanie CooleyJim  Maxwell M.D.   On: 10/01/2014 06:04   Dg Chest Port 1  View  09/30/2014   CLINICAL DATA:  Thoracic aortic aneurysm with endovascular stent graft placement  EXAM: PORTABLE CHEST - 1 VIEW  COMPARISON:  Chest radiograph September 27, 2014 and chest CT September 29, 2014  FINDINGS: Aortic stent graft is again noted. There is persistent left lower lobe consolidation with left effusion. There is a minimal right effusion with mild right base atelectasis. Heart is enlarged. The pulmonary vascularity is within normal limits. Cordis tip is in the superior vena cava. No pneumothorax. No bone lesions.  IMPRESSION: No change in cardiac silhouette. Persistent consolidation left lower lobe with small effusion on the left. Minimal right effusion with right base atelectatic change, stable. No pneumothorax.   Electronically Signed   By: Bretta BangWilliam  Woodruff M.D.   On: 09/30/2014 07:57   Ct Angio Chest Aortic Dissect W &/or W/o  09/29/2014   CLINICAL DATA:  Thoracic aortic aneurysm.  EXAM: CT ANGIOGRAPHY CHEST, ABDOMEN AND PELVIS  TECHNIQUE: Multidetector CT imaging through the chest, abdomen and pelvis was performed using the standard protocol during bolus administration of intravenous contrast. Multiplanar reconstructed images and MIPs were obtained and reviewed to evaluate the vascular anatomy.  CONTRAST:  100mL OMNIPAQUE IOHEXOL 350 MG/ML SOLN  COMPARISON:  CT scan of August 28, 2014.  FINDINGS: CTA CHEST FINDINGS  Moderate left pleural effusion is noted with atelectasis of the left lower lobe. Mild right pleural  effusion is noted with adjacent subsegmental atelectasis of right lower lobe. Stable 10 mm nodule is noted in right middle lobe. No pneumothorax is noted. There is no evidence of pulmonary embolus. Patient is status post endo graft placement of the thoracic aorta, with the proximal ingest distal to the origin of the left subclavian artery, and distal and extending to just above the aortic hiatus. No endoleak is noted. Old intramural hemorrhage is seen surrounding the endo  graft in the proximal portion of the descending thoracic aorta. No active extravasation is seen at this time. The aorta measures 6.5 x 5.9 cm at this level. Distally, the descending thoracic aorta measures 3.9 x 3.6 cm.  The origins of the great vessels are widely patent. 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 The left vertebral artery appears to arise from the true lumen. The dissection is seen to extend through the remaining portion of the left subclavian artery to the left axillary artery.  Review of the MIP images confirms the above findings.  CTA ABDOMEN AND PELVIS FINDINGS  No significant abnormality is noted in the liver, spleen or pancreas. No gallstones are noted. Adrenal glands appear normal. No hydronephrosis or renal obstruction is noted. Bilateral cortical scarring is seen involving both kidneys. Nonobstructive calculus is noted in lower pole collecting system of left kidney. Focal hyper enhancement is noted involving the cortex of the lower pole the right kidney which was not present on prior exam and is felt to represent artifact. Stool is noted in the right and transverse colon. Sigmoid diverticulosis is noted without inflammation. No abnormal fluid collection is noted in the abdomen or pelvis. Urinary bladder is decompressed secondary to Foley catheter.  There is no evidence of dissection involving the abdominal aorta. Status post surgical repair of abdominal aortic aneurysm. The aorta is widely patent without significant stenosis. No endoleak is noted. Maximum measured AP diameter of 3 cm is noted. Atherosclerotic calcifications are noted. 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 Superior mesenteric artery is widely patent. Inferior  mesenteric artery is not visualized and presumably is occluded. Both renal arteries are widely patent. Moderate calcified plaque is seen involving the origin of the left renal artery resulting in moderate stenosis.  Moderate focal stenosis is seen involving the midportion of the right external iliac artery. Severe stenosis is seen involving the left common femoral artery at its bifurcation.  Review of the MIP images confirms the above findings.  IMPRESSION: Status post endo graft repair of thoracic aortic aneurysm with intramural hemorrhage. No endoleak or active extravasation is seen at this time.  There is the interval development of dissection involving the left subclavian artery just beyond its origin which extends slightly into the origin of the left internal mammary artery, and is seen to extend through the remaining portion of the left subclavian artery and into the left axillary artery.  Moderate left pleural effusion is noted with associated atelectasis of the left lower lobe. Mild right pleural effusion is noted with adjacent subsegmental atelectasis of the right lower lobe.  Stable right middle lobe nodule is noted.  Status post surgical repair of abdominal aortic aneurysm. No dissection or hemorrhage is seen involving the abdominal aorta. 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 Superior mesenteric and renal arteries are patent, although moderate calcified plaque is noted at the origin of the left renal artery. Inferior mesenteric artery appears to be excluded at its origin.  Moderate focal stenosis is seen in the midportion of the right external iliac artery, as well as severe stenosis seen in the left common femoral artery at its bifurcation.  I was unable to contact physician to discuss these findings. These results will be called to the ordering clinician or representative by the Radiologist Assistant, and communication documented in the PACS or  zVision Dashboard.   Electronically Signed   By: Roque Lias M.D.   On: 09/29/2014 10:01   Ct Angio Abd/pel W/ And/or W/o  09/29/2014   CLINICAL DATA:  Thoracic aortic aneurysm.  EXAM: CT ANGIOGRAPHY CHEST, ABDOMEN AND PELVIS  TECHNIQUE: Multidetector CT imaging through the chest, abdomen and pelvis was performed using the standard protocol during bolus administration of intravenous contrast. Multiplanar reconstructed images and MIPs were obtained and reviewed to evaluate the vascular anatomy.  CONTRAST:  OMNIPAQUE IOHEXOL 350 MG/ML SOLN  COMPARISON:  CT scan of August 28, 2014.  FINDINGS: CTA CHEST FINDINGS  Moderate left pleural effusion is noted with atelectasis of the left lower lobe. Mild right pleural effusion is noted with adjacent subsegmental atelectasis of right lower lobe. Stable 10 mm nodule is noted in right middle lobe. No pneumothorax is noted. There is no evidence of pulmonary embolus. Patient is status post endo graft placement of the thoracic aorta, with the proximal ingest distal to the origin of the left subclavian artery, and distal and extending to just above the aortic hiatus. No endoleak is noted. Old intramural hemorrhage is seen surrounding the endo graft in the proximal portion of the descending thoracic aorta. No active extravasation is seen at this time. The aorta measures 6.5 x 5.9 cm at this level. Distally, the descending thoracic aorta measures 3.9 x 3.6 cm.  The origins of the great vessels are widely patent. 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 The left vertebral artery appears to arise from the true lumen. The dissection is seen to extend through the remaining portion of the left subclavian artery to the left axillary artery.  Review of the MIP images confirms the above findings.  CTA ABDOMEN AND PELVIS  FINDINGS  No significant abnormality is noted in the liver, spleen or pancreas. No gallstones are noted. Adrenal glands appear normal. No hydronephrosis or renal obstruction is noted. Bilateral cortical scarring is seen involving both kidneys. Nonobstructive calculus is noted in lower pole collecting system of left kidney. Focal hyper enhancement is noted involving the cortex of the lower pole the right kidney which was not present on prior exam and is felt to represent artifact. Stool is noted in the right and transverse colon. Sigmoid diverticulosis is noted without inflammation. No abnormal fluid collection is noted in the abdomen or pelvis. Urinary bladder is decompressed secondary to Foley catheter.  There is no evidence of dissection involving the abdominal aorta. Status post surgical repair of abdominal aortic aneurysm. The aorta is widely patent without significant stenosis. No endoleak is noted. Maximum measured AP diameter of 3 cm is noted. Atherosclerotic calcifications are noted. 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 Superior mesenteric artery is  widely patent. Inferior mesenteric artery is not visualized and presumably is occluded. Both renal arteries are widely patent. Moderate calcified plaque is seen involving the origin of the left renal artery resulting in moderate stenosis.  Moderate focal stenosis is seen involving the midportion of the right external iliac artery. Severe stenosis is seen involving the left common femoral artery at its bifurcation.  Review of the MIP images confirms the above findings.  IMPRESSION: Status post endo graft repair of thoracic aortic aneurysm with intramural hemorrhage. No endoleak or active extravasation is seen at this time.  There is the interval development of dissection involving the left subclavian artery just beyond its origin which extends slightly  into the origin of the left internal mammary artery, and is seen to extend through the remaining portion of the left subclavian artery and into the left axillary artery.  Moderate left pleural effusion is noted with associated atelectasis of the left lower lobe. Mild right pleural effusion is noted with adjacent subsegmental atelectasis of the right lower lobe.  Stable right middle lobe nodule is noted.  Status post surgical repair of abdominal aortic aneurysm. No dissection or hemorrhage is seen involving the abdominal aorta. 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 Superior mesenteric and renal arteries are patent, although moderate calcified plaque is noted at the origin of the left renal artery. Inferior mesenteric artery appears to be excluded at its origin.  Moderate focal stenosis is seen in the midportion of the right external iliac artery, as well as severe stenosis seen in the left common femoral artery at its bifurcation.  I was unable to contact physician to discuss these findings. These results will be called to the ordering clinician or representative by the Radiologist Assistant, and communication documented in the PACS or zVision Dashboard.   Electronically Signed   By: Roque LiasJames  Green M.D.   On: 09/29/2014 10:01    Assessment/Plan:  POD #4  LOS: 4 days  s/p Procedure(s): THORACIC AORTIC ENDOVASCULAR STENT GRAFT  Stable today post removal of spinal drain last p.m. with stable blood pressure and hematocrit 26.5%-no change  We'll keep patient in unit today to work on ambulation and transfer to 2 W. in a.m.   Josephina GipJames Ammaar [place], MD 10/01/2014 8:44 AM

## 2014-10-02 LAB — GLUCOSE, CAPILLARY
GLUCOSE-CAPILLARY: 145 mg/dL — AB (ref 70–99)
GLUCOSE-CAPILLARY: 113 mg/dL — AB (ref 70–99)
Glucose-Capillary: 127 mg/dL — ABNORMAL HIGH (ref 70–99)
Glucose-Capillary: 111 mg/dL — ABNORMAL HIGH (ref 70–99)
Glucose-Capillary: 126 mg/dL — ABNORMAL HIGH (ref 70–99)

## 2014-10-02 MED ORDER — OXYCODONE-ACETAMINOPHEN 5-325 MG PO TABS
1.0000 | ORAL_TABLET | Freq: Four times a day (QID) | ORAL | Status: DC | PRN
  Administered 2014-10-02 – 2014-10-04 (×6): 1 via ORAL
  Filled 2014-10-02 (×6): qty 1

## 2014-10-02 MED ORDER — LABETALOL HCL 300 MG PO TABS
300.0000 mg | ORAL_TABLET | Freq: Three times a day (TID) | ORAL | Status: DC
  Administered 2014-10-02 (×3): 300 mg via ORAL
  Filled 2014-10-02 (×8): qty 1

## 2014-10-02 MED ORDER — HYDRALAZINE HCL 20 MG/ML IJ SOLN: 10.0000 mg | Freq: Four times a day (QID) | INTRAMUSCULAR | Status: DC | PRN

## 2014-10-02 NOTE — Plan of Care (Signed)
Problem: Phase I Progression Outcomes Goal: Vascular site scale level 0 - I Vascular Site Scale Level 0: No bruising/bleeding/hematoma Level I (Mild): Bruising/Ecchymosis, minimal bleeding/ooozing, palpable hematoma < 3 cm Level II (Moderate): Bleeding not affecting hemodynamic parameters, pseudoaneurysm, palpable hematoma > 3 cm Level III (Severe) Bleeding which affects hemodynamic parameters or retroperitoneal hemorrhage  Outcome: Completed/Met Date Met:  10/02/14 Goal: Pain controlled with appropriate interventions Outcome: Completed/Met Date Met:  10/02/14 Goal: Activity progression as ordered Outcome: Not Met (add Reason) Pt very deconditioned requiring major 2 person assist to get OOB Goal: Voiding after catheter removal Outcome: Completed/Met Date Met:  10/02/14 Goal: If Diabetic, blood sugar < 150 Outcome: Completed/Met Date Met:  10/02/14

## 2014-10-02 NOTE — Progress Notes (Deleted)
Pt's O2 sats noted to be significantly decreased into the lower 80's range, coughnig exercises and repositiioning performed without notable increase of O2 sats. Lung sounds slightly coarse/ diminished throughout. Oxygen increased from [place] to 55% venturi mask, PRN HHN provided per RRT with gradual increase of O2 sats to 92-96%. Will continue to monitor and observe. 

## 2014-10-02 NOTE — Progress Notes (Signed)
Patient ID: Jennifer [name], female   DOB: 03/12/1925, 80 y.o.   MRN: [id number] Vascular Surgery Progress Note  Subjective: no complaints of nausea and vomiting today. Denies back pain. Blood pressure has been stable according to nursing staff. Patient hungry and wants increased diet  Objective:  Filed Vitals:   10/02/14 0800  BP: 100/65  Pulse: 77  Temp: 98 F (36.7 C)  Resp: 23    Abdomen soft nontender Left inguinal area soft with no hematoma and well perfused left lower extremity   Labs:  Recent Labs Lab 09/28/14 0500 09/29/14 0029 10/01/14 0405  CREATININE 0.99 1.02 0.90    Recent Labs Lab 09/28/14 0500 09/29/14 0029 10/01/14 0405  NA 142 136* 141  K 3.6* 4.0 3.9  CL 105 101 105  CO2 22 21 23   BUN 16 15 25*  CREATININE 0.99 1.02 0.90  GLUCOSE 133* 133* 99  CALCIUM 9.3 9.3 9.3    Recent Labs Lab 09/30/14 1520 09/30/14 2105 10/01/14 0405  WBC 10.9* 10.1 8.9  HGB 8.4* 7.8* 8.3*  HCT 26.6* 24.8* 26.5*  PLT 170 172 180    Recent Labs Lab 09/27/14 1630 09/27/14 2145  INR 1.20 1.24    I/O last 3 completed shifts: In: 1060 [P.O.:760; I.V.:300] Out: 2125 [Urine:2125]  Imaging: Dg Chest Port 1 View  10/01/2014   CLINICAL DATA:  Pleural effusion.  EXAM: PORTABLE CHEST - 1 VIEW  COMPARISON:  09/30/2014 and 09/27/2014 and CT scan dated 09/29/2014  FINDINGS: Sheath remains in the upper superior vena cava.  Persistent slight atelectasis at the right lung base with a small effusion.  Slight improvement in the atelectasis and effusion at the left base. Overall heart size is normal. Vascularity is normal.  IMPRESSION: Decreased atelectasis and effusion at the left base. No other change.   Electronically Signed   By: Geanie CooleyJim  Maxwell M.D.   On: 10/01/2014 06:04    Assessment/Plan:  POD #5   LOS: 5 days  s/p Procedure(s): THORACIC AORTIC ENDOVASCULAR STENT GRAFT  Patient blood pressure has been stable. We'll transfer to 2 W. Today and continue to increase out  of bed and ambulation We'll tentatively plan DC home in a.m. Per Dr. Estanislado SpireBrabham's evaluation tomorrow   Josephina GipJames Firmin [place], MD 10/02/2014 8:38 AM

## 2014-10-02 NOTE — Progress Notes (Signed)
5 Days Post-Op Procedure(s) (LRB): THORACIC AORTIC ENDOVASCULAR STENT GRAFT (N/A) Subjective: Hungry. Denies back or abdominal pain  Objective: Vital signs in last 24 hours: Temp:  [97.5 F (36.4 C)-98.4 F (36.9 C)] 98.4 F (36.9 C) (10/31 2000) Pulse Rate:  [70-84] 73 (11/01 0645) Cardiac Rhythm:  [-] Normal sinus rhythm (10/31 2000) Resp:  [16-30] 23 (11/01 0645) BP: (139-175)/(55-78) 172/75 mmHg (11/01 0645) SpO2:  [93 %-98 %] 96 % (11/01 0645)  Hemodynamic parameters for last 24 hours:    Intake/Output from previous day: 10/31 0701 - 11/01 0700 In: 740 [P.O.:660; I.V.:80] Out: 1350 [Urine:1350] Intake/Output this shift:    General appearance: alert and cooperative Neurologic: intact Heart: regular rate and rhythm, S1, S2 normal, no murmur, click, rub or gallop Lungs: diminished breath sounds LLL Abdomen: soft, non-tender; bowel sounds normal; no masses,  no organomegaly Extremities: extremities normal, atraumatic, no cyanosis or edema Wound: left groin wound ok, nontender.  Lab Results:  Recent Labs  09/30/14 2105 10/01/14 0405  WBC 10.1 8.9  HGB 7.8* 8.3*  HCT 24.8* 26.5*  PLT 172 180   BMET:  Recent Labs  10/01/14 0405  NA 141  K 3.9  CL 105  CO2 23  GLUCOSE 99  BUN 25*  CREATININE 0.90  CALCIUM 9.3    PT/INR: No results for input(s): LABPROT, INR in the last 72 hours. ABG    Component Value Date/Time   PHART 7.382 09/01/2014 1622   HCO3 25.6* 09/01/2014 1622   TCO2 27 09/01/2014 1622   ACIDBASEDEF 1.5 07/30/2010 1230   O2SAT 99.0 09/01/2014 1622   CBG (last 3)   Recent Labs  10/01/14 1208 10/01/14 1611 10/01/14 2158  GLUCAP 125* 140* 127*    Assessment/Plan: S/P Procedure(s) (LRB): THORACIC AORTIC ENDOVASCULAR STENT GRAFT (N/A)  She has been hemodynamically stable but on the hypertensive side 150-170's. Will increase the labetalol. Continue lisinopril. Transfer to 2W today. She is not ambulating more than a few steps so  far. PT has been consulted. She may need SNF. I don't know if her husband can take care of her right now at home if she can't ambulate.   LOS: 5 days    BARTLE,BRYAN K 10/02/2014

## 2014-10-02 NOTE — Evaluation (Signed)
Physical Therapy Evaluation Patient Details Name: Jennifer MusselJanie B Pafford MRN: [id number] DOB: 04/06/1925 Today's Date: 10/02/2014   History of Present Illness  Pt is an 80 yo female who presented to ED 9/27 with chest pain. She was admitted with suspicion for aortic aneurysm vs PE. A CT of the chest showed acute intramural hemorrhage of the descending thoracic aorta.   Clinical Impression  Patient demonstrates deficits in functional mobility as indicated below. Will benefit from continued skilled PT to address deficits and maximize function. Will see as indicated and progress as tolerated. Don note feel patient is safe for d/c home, currently requires +2 physical assist for mobility, however, patient is insistent on d/c home. Highly encourage ST SNF however, if patient continues to desire home, will require hand on physical assist 24/7 and HHPT.    Follow Up Recommendations SNF;Supervision for mobility/OOB (patient prefers home will need HHPT and 24/7 physical assist)    Equipment Recommendations  3in1 (PT);Rolling walker with 5" wheels    Recommendations for Other Services       Precautions / Restrictions Precautions Precautions: Fall Restrictions Weight Bearing Restrictions: No      Mobility  Bed Mobility               General bed mobility comments: received on BSC  Transfers Overall transfer level: Needs assistance Equipment used: Rolling walker (2 wheeled) Transfers: Sit to/from Stand Sit to Stand: Mod assist         General transfer comment: VCs for hand placement, increased assist for stability, poor ability to reach upright or maintain static balance in standing  Ambulation/Gait Ambulation/Gait assistance: Max assist;+2 physical assistance Ambulation Distance (Feet): 16 Feet Assistive device: Rolling walker (2 wheeled) Gait Pattern/deviations: Step-to pattern;Decreased stride length;Shuffle;Trunk flexed;Narrow base of support Gait velocity: decreased Gait velocity  interpretation: <1.8 ft/sec, indicative of risk for recurrent falls General Gait Details: Max cues for pacing and max assist for ambulation, patient unable to maintain upright to ambulate, significantly unsteady.  Stairs            Wheelchair Mobility    Modified Rankin (Stroke Patients Only)       Balance Overall balance assessment: Needs assistance   Sitting balance-Leahy Scale: Good       Standing balance-Leahy Scale: Poor Standing balance comment: max assist for stability in standing                             Pertinent Vitals/Pain Pain Assessment: No/denies pain    Home Living Family/patient expects to be discharged to:: Private residence Living Arrangements: Spouse/significant other Available Help at Discharge: Family;Available 24 hours/day Type of Home: House         Home Equipment: None      Prior Function Level of Independence: Independent               Hand Dominance   Dominant Hand: Right    Extremity/Trunk Assessment   Upper Extremity Assessment: Defer to OT evaluation           Lower Extremity Assessment: Generalized weakness (significant weakness and coordination deficits)         Communication   Communication: No difficulties  Cognition Arousal/Alertness: Awake/alert Behavior During Therapy: WFL for tasks assessed/performed Overall Cognitive Status: Within Functional Limits for tasks assessed                      General Comments  Exercises General Exercises - Lower Extremity Ankle Circles/Pumps: AROM;Seated;Both;20 reps Long Arc Quad: AROM;Seated;Both;20 reps Hip Flexion/Marching: AROM;Seated;Both;20 reps      Assessment/Plan    PT Assessment Patient needs continued PT services  PT Diagnosis Difficulty walking;Generalized weakness   PT Problem List Decreased strength;Decreased range of motion;Decreased activity tolerance;Decreased balance;Decreased mobility;Decreased knowledge of use  of DME;Decreased safety awareness;Decreased knowledge of precautions  PT Treatment Interventions DME instruction;Gait training;Stair training;Functional mobility training;Therapeutic activities;Therapeutic exercise;Neuromuscular re-education;Patient/family education   PT Goals (Current goals can be found in the Care Plan section) Acute Rehab PT Goals Patient Stated Goal: To return home with her husband independently PT Goal Formulation: With patient Time For Goal Achievement: 09/12/14 Potential to Achieve Goals: Good    Frequency Min 3X/week   Barriers to discharge        Co-evaluation               End of Session Equipment Utilized During Treatment: Gait belt Activity Tolerance: Patient tolerated treatment well Patient left: in chair;with call bell/phone within reach;with family/visitor present Nurse Communication: Mobility status         Time: 0922-0949 PT Time Calculation (min): 27 min   Charges:   PT Evaluation $Initial PT Evaluation Tier I: 1 Procedure PT Treatments $Gait Training: 8-22 mins $Therapeutic Activity: 8-22 mins   PT G CodesFabio Asa:          Alveena Taira J 10/02/2014, 11:25 AM Charlotte Crumbevon Chania Kochanski, PT DPT  [phone]

## 2014-10-02 NOTE — Plan of Care (Signed)
Problem: Phase I Progression Outcomes Goal: Activity progression as ordered Outcome: Progressing  Problem: Phase II Discharge Progression Outcomes Goal: Wound healing without s/s of infection Outcome: Progressing Goal: Peripheral pulses at baseline (AAA) Outcome: Progressing Goal: Ambulates without assistance Outcome: Not Progressing Slow with activity ; needs two people for transfers due to weakness Goal: O2 Saturation > or equal to 90% on room air Outcome: Not Progressing Requiring oxygern @ 1L/min  Goal: Pain controlled with appropriate interventions Outcome: Progressing Goal: Tolerating diet Outcome: Progressing

## 2014-10-03 LAB — GLUCOSE, CAPILLARY
GLUCOSE-CAPILLARY: 113 mg/dL — AB (ref 70–99)
Glucose-Capillary: 125 mg/dL — ABNORMAL HIGH (ref 70–99)
Glucose-Capillary: 112 mg/dL — ABNORMAL HIGH (ref 70–99)
Glucose-Capillary: 108 mg/dL — ABNORMAL HIGH (ref 70–99)

## 2014-10-03 MED ORDER — LABETALOL HCL 200 MG PO TABS
400.0000 mg | ORAL_TABLET | Freq: Three times a day (TID) | ORAL | Status: DC
  Administered 2014-10-03 – 2014-10-04 (×4): 400 mg via ORAL
  Filled 2014-10-03 (×9): qty 2

## 2014-10-03 MED ORDER — HYDROCODONE-ACETAMINOPHEN 5-325 MG PO TABS: 1.0000 | ORAL_TABLET | Freq: Four times a day (QID) | ORAL | Status: DC | PRN

## 2014-10-03 NOTE — Progress Notes (Signed)
6 Days Post-Op Procedure(s) (LRB): THORACIC AORTIC ENDOVASCULAR STENT GRAFT (N/A) Subjective:  No complaints. Had 2 BM's yesterday  Objective: Vital signs in last 24 hours: Temp:  [98 F (36.7 C)-98.9 F (37.2 C)] 98 F (36.7 C) (11/02 0429) Pulse Rate:  [71-75] 75 (11/02 0429) Cardiac Rhythm:  [-] Normal sinus rhythm (11/02 0227) Resp:  [18-30] 18 (11/02 0429) BP: (143-178)/(54-77) 178/77 mmHg (11/02 0429) SpO2:  [93 %-96 %] 95 % (11/02 0429)  Hemodynamic parameters for last 24 hours:    Intake/Output from previous day: 11/01 0701 - 11/02 0700 In: 460 [P.O.:460] Out: 325 [Urine:325] Intake/Output this shift:    General appearance: alert and cooperative Neurologic: intact Heart: regular rate and rhythm, S1, S2 normal, no murmur, click, rub or gallop Lungs: diminished breath sounds bibasilar Abdomen: soft, non-tender; bowel sounds normal; no masses,  no organomegaly Extremities: extremities normal, atraumatic, no cyanosis or edema Wound: left groin incision ok  Lab Results:  Recent Labs  09/30/14 2105 10/01/14 0405  WBC 10.1 8.9  HGB 7.8* 8.3*  HCT 24.8* 26.5*  PLT 172 180   BMET:  Recent Labs  10/01/14 0405  NA 141  K 3.9  CL 105  CO2 23  GLUCOSE 99  BUN 25*  CREATININE 0.90  CALCIUM 9.3    PT/INR: No results for input(s): LABPROT, INR in the last 72 hours. ABG    Component Value Date/Time   PHART 7.382 09/01/2014 1622   HCO3 25.6* 09/01/2014 1622   TCO2 27 09/01/2014 1622   ACIDBASEDEF 1.5 07/30/2010 1230   O2SAT 99.0 09/01/2014 1622   CBG (last 3)   Recent Labs  10/02/14 1603 10/02/14 2135 10/03/14 0615  GLUCAP 113* 126* 125*    Assessment/Plan: S/P Procedure(s) (LRB): THORACIC AORTIC ENDOVASCULAR STENT GRAFT (N/A)  She is making slow progress as expected but looks better daily. She is still hypertensive so will increase labetalol back to previous dose 400 tid.  She is a 2 person assist and PT recommends SNF but patient really  wants to go home. Her daughter is a Engineer, civil (consulting)nurse and is coming from [place] tomorrow to stay with her for a while with her husband so she wants to talk to her about it before making any discharge decision. If the daughter and husband are with her at home she will probably be ok with home health PT.   LOS: 6 days    Jennifer [name] K 10/03/2014

## 2014-10-03 NOTE — Progress Notes (Signed)
    Subjective  - POD #6, s/p TEVAR  States that her left groin pain is gone.  She still has some back pain but it is better than before surgery.   Physical Exam:  Palpable femoral pulse Abdomen is soft and nontender Neurologically intact with good lower extremity strength   Assessment/Plan:  POD #6  Acute blood loss anemia: Hemoglobin remained stable. Disposition: Hopefully, the patient will be able to be discharged to home.  There is a question of about skilled nursing facility which will need to be resolved with physical therapy.  Hopefully she can go home with home PT.  Yocelin Vanlue IV, V. WELLS 10/03/2014 10:30 AM --  Filed Vitals:   10/03/14 0429  BP: 178/77  Pulse: 75  Temp: 98 F (36.7 C)  Resp: 18    Intake/Output Summary (Last 24 hours) at 10/03/14 1030 Last data filed at 10/03/14 0730  Gross per 24 hour  Intake    600 ml  Output    175 ml  Net    425 ml     Laboratory CBC    Component Value Date/Time   WBC 8.9 10/01/2014 0405   HGB 8.3* 10/01/2014 0405   HCT 26.5* 10/01/2014 0405   PLT 180 10/01/2014 0405    BMET    Component Value Date/Time   NA 141 10/01/2014 0405   K 3.9 10/01/2014 0405   CL 105 10/01/2014 0405   CO2 23 10/01/2014 0405   GLUCOSE 99 10/01/2014 0405   BUN 25* 10/01/2014 0405   CREATININE 0.90 10/01/2014 0405   CALCIUM 9.3 10/01/2014 0405   GFRNONAA 56* 10/01/2014 0405   GFRAA 65* 10/01/2014 0405    COAG Lab Results  Component Value Date   INR 1.24 09/27/2014   INR 1.20 09/27/2014   INR 1.03 08/28/2014   No results found for: PTT  Antibiotics Anti-infectives    Start     Dose/Rate Route Frequency Ordered Stop   09/28/14 0300  cefUROXime (ZINACEF) 1.5 g in dextrose 5 % 50 mL IVPB     1.5 g100 mL/hr over 30 Minutes Intravenous Every 12 hours 09/27/14 2244 09/28/14 1450   09/27/14 1830  cefUROXime (ZINACEF) 1.5 g in dextrose 5 % 50 mL IVPB     1.5 g100 mL/hr over 30 Minutes Intravenous To Surgery 09/27/14 1812  09/28/14 1830   09/27/14 1615  cefUROXime (ZINACEF) 1.5 g in dextrose 5 % 50 mL IVPB     1.5 g100 mL/hr over 30 Minutes Intravenous 30 min pre-op 09/27/14 1555 09/27/14 1820       V. Charlena CrossWells Winda Summerall IV, M.D. [facility] [place] Office: [phone] Pager:  [phone]

## 2014-10-03 NOTE — Progress Notes (Signed)
Agree with student nurse assessment. Husband updated on plan of d/c cnx and new meds started. Will continue to monitor pt.

## 2014-10-03 NOTE — Progress Notes (Signed)
Physical Therapy Treatment Patient Details Name: Jennifer [name] MRN: [id number] DOB: 12/02/1925 Today's Date: 10/03/2014    History of Present Illness Pt is an 80 yo female who presented to ED 9/27 with chest pain. She was admitted with suspicion for aortic aneurysm vs PE. A CT of the chest showed acute intramural hemorrhage of the descending thoracic aorta.     PT Comments    Pt progressing with gait but continues to have significant posterior lean, assist for all transfers and gait and limited activity tolerance. Pt today acknowledging that she needs assist with mobility and spouse would not be able to provide significant assist for D/C home with current agreement for sT-SNF. Pt encouraged to mobilize with nursing and continue HEP. Will follow.    Follow Up Recommendations  SNF;Supervision for mobility/OOB     Equipment Recommendations  3in1 (PT);Rolling walker with 5" wheels    Recommendations for Other Services       Precautions / Restrictions Precautions Precautions: Fall Precaution Comments: posterior lean Restrictions Weight Bearing Restrictions: No    Mobility  Bed Mobility Overal bed mobility: Needs Assistance Bed Mobility: Rolling;Sidelying to Sit Rolling: Supervision Sidelying to sit: Min assist       General bed mobility comments: cues for sequence with assist to elevate trunk from surface, scoot forward and maintain balance EOB due to posterior lean  Transfers Overall transfer level: Needs assistance   Transfers: Sit to/from Stand Sit to Stand: Mod assist         General transfer comment: cues for hand placement, anterior translation, and elevation from surface with posterior bias  Ambulation/Gait Ambulation/Gait assistance: Mod assist Ambulation Distance (Feet): 30 Feet Assistive device: Rolling walker (2 wheeled) Gait Pattern/deviations: Step-through pattern;Decreased stride length;Shuffle;Trunk flexed   Gait velocity interpretation: <1.8  ft/sec, indicative of risk for recurrent falls General Gait Details: max cues for posture, position in RW, trunk extension and anterior translation as pt with tendency for crouched posture with posterior lean   Stairs            Wheelchair Mobility    Modified Rankin (Stroke Patients Only)       Balance Overall balance assessment: Needs assistance   Sitting balance-Leahy Scale: Poor   Postural control: Posterior lean   Standing balance-Leahy Scale: Zero                      Cognition Arousal/Alertness: Awake/alert Behavior During Therapy: Flat affect Overall Cognitive Status: Impaired/Different from baseline Area of Impairment: Safety/judgement         Safety/Judgement: Decreased awareness of deficits;Decreased awareness of safety          Exercises General Exercises - Lower Extremity Long Arc Quad: AROM;Both;Seated;20 reps Hip ABduction/ADduction: AAROM;Both;15 reps;Seated Hip Flexion/Marching: AROM;Both;20 reps;Seated    General Comments        Pertinent Vitals/Pain Pain Assessment: No/denies pain  sats 92-94% on RA throughout    Home Living                      Prior Function            PT Goals (current goals can now be found in the care plan section) Progress towards PT goals: Progressing toward goals    Frequency  Min 3X/week    PT Plan Current plan remains appropriate    Co-evaluation             End of Session Equipment Utilized During Treatment: Gait belt  Activity Tolerance: Patient tolerated treatment well Patient left: in chair;with call bell/phone within reach     Time: 0955-1021 PT Time Calculation (min): 26 min  Charges:  $Gait Training: 8-22 mins $Therapeutic Exercise: 8-22 mins                    G Codes:      Jennifer [name], Jennifer [name] 10/03/2014, 11:25 AM Jennifer [name], PT [phone]

## 2014-10-03 NOTE — Progress Notes (Signed)
Pt noted to have foul-smelling urine this evening and looking back it has been present for a few days.  Pt also has frequency and urgency, but had no U/A on file.  MD notified.

## 2014-10-03 NOTE — Clinical Documentation Improvement (Signed)
Dr. Laneta SimmersBartle progress note 09/30/14 - "CT reviewed yesterday and the aneurysm is excluded with no endoleak. 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

## 2014-10-04 ENCOUNTER — Other Ambulatory Visit: Payer: Self-pay | Admitting: *Deleted

## 2014-10-04 DIAGNOSIS — I712 Thoracic aortic aneurysm, without rupture, unspecified: Secondary | ICD-10-CM

## 2014-10-04 DIAGNOSIS — Z48812 Encounter for surgical aftercare following surgery on the circulatory system: Secondary | ICD-10-CM

## 2014-10-04 LAB — GLUCOSE, CAPILLARY
GLUCOSE-CAPILLARY: 92 mg/dL (ref 70–99)
Glucose-Capillary: 103 mg/dL — ABNORMAL HIGH (ref 70–99)
Glucose-Capillary: 107 mg/dL — ABNORMAL HIGH (ref 70–99)

## 2014-10-04 MED ORDER — ASPIRIN EC 81 MG PO TBEC: 81.0000 mg | DELAYED_RELEASE_TABLET | Freq: Every day | ORAL | Status: DC

## 2014-10-04 MED ORDER — HYDROCODONE-ACETAMINOPHEN 5-325 MG PO TABS: 1.0000 | ORAL_TABLET | Freq: Four times a day (QID) | ORAL | Status: DC | PRN

## 2014-10-04 NOTE — Progress Notes (Addendum)
       [address]       [place] [id number]             [phone]          7 Days Post-Op Procedure(s) (LRB): THORACIC AORTIC ENDOVASCULAR STENT GRAFT (N/A)  Subjective: Not feeling as well this am. No specific complaints, but frustrated with progress. "I feel like I should be doing better than I am."   Objective: Vital signs in last 24 hours: Patient Vitals for the past 24 hrs:  BP Temp Temp src Pulse Resp SpO2  10/04/14 0300 (!) 162/72 mmHg 98.8 F (37.1 C) Oral 73 18 90 %  10/03/14 2050 (!) 142/66 mmHg 98.1 F (36.7 C) Oral 72 18 92 %  10/03/14 1340 (!) 156/68 mmHg 98 F (36.7 C) Oral 72 18 94 %  10/03/14 1033 (!) 149/59 mmHg - - - - -   Current Weight  10/01/14 139 lb 3.2 oz (63.141 kg)     Intake/Output from previous day: 11/02 0701 - 11/03 0700 In: 720 [P.O.:720] Out: -     PHYSICAL EXAM:  Heart: RRR Lungs: Clear Wound: Groin clean and dry, no hematoma Extremities: Warm, perfused, no edema    Lab Results: CBC:No results for input(s): WBC, HGB, HCT, PLT in the last 72 hours. BMET: No results for input(s): NA, K, CL, CO2, GLUCOSE, BUN, CREATININE, CALCIUM in the last 72 hours.  PT/INR: No results for input(s): LABPROT, INR in the last 72 hours.    Assessment/Plan: S/P Procedure(s) (LRB): THORACIC AORTIC ENDOVASCULAR STENT GRAFT (N/A) BPs remain elevated, back on home dose of labetalol. Continue to watch. DM- sugars stable on home meds. Disp- SNF vs home with family soon.   LOS: 7 days    COLLINS,GINA H 10/04/2014   Chart reviewed, patient examined, agree with above. She is stable. Her BP is 140's most of the time which I think is ok at 80 years old. I am more concerned about dropping it too low. Her daughter is suppose to be here today to discuss discharge plans.

## 2014-10-04 NOTE — Progress Notes (Signed)
Report called to Sherri at [facility], Annamary Rummagelivia G, RN

## 2014-10-04 NOTE — Discharge Summary (Signed)
[facility] Discharge Summary  Jennifer [name] [birth year] 80 y.o. female  [id number]  Admission Date: 09/27/2014  Discharge Date: 10/04/2014  Physician: Nada Libman, MD  Admission Diagnosis: Thoracic Aortic Aneurysm   HPI:   This is a 80 y.o. female who was was hospitalized from 08/28/2014-09/12/2014 with left-sided chest pain. Imaging studies revealed, initially, acute intramural hemorrhage of the descending thoracic aorta with an associated 5 cm aneurysm. Her aneurysm causes compression of the superior left pulmonary vein. She was managed medically and was ultimately able to be discharged home. She is back today to further discuss surgical repair. She does state that she began having back pain earlier today.  Patient has a history of a open abdominal aortic aneurysm repair. She suffers from diabetes. Her most recent hemoglobin A1c was 6.8. Her hypercholesterolemia is treated with a statin. She has a history of acute on chronic diastolic heart failure. She has a history of stage III renal disease. Most recent creatinine was 1.1.  After discharge from the hospital recently, she went to [facility] for rehabilitation. She has been discharged to home where she lives with her husband. She is walking with a walker.  Hospital Course:  The patient was admitted to the hospital and taken to the operating room on 09/27/2014 and underwent:  1: Endovascular repair of descending thoracic aortic aneurysm. 2: Distal extension x1  3: Catheter in the thoracic aorta x2  4: Aortic arch angiogram  5: Thoracic aortogram   The patient tolerated the procedure well and was transported to the PACU in stable condition. She had a lumbar drain. She was transferred to the surgical ICU.   On POD 1, she complained of some mild soreness in the her back. She was neurologically intact. She had minimal output from her lumbar drain and it was capped. She was hypertensive  requiring hydralazine for systolic 140s.  Her creatinine was stable. Her left groin was soft without hematoma. Her hemoglobin was stable.   On POD 2-3, she continued to do well and was neurologically intact. Her CT scan revealed the graft to be in good position with no endoleak. She did have a moderate left pleural effusion but was asymptomatic. 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 However, she was asymptomatic.  Her spinal drain was removed late POD 3 due to the patient receiving lovenox earlier in the day. She was started on lisinopril because her blood pressure remained in the 160s.   Later afternoon on POD 3, she complained of 10/10 left lower abdominal pain which resolved with oxycodone IR. She developed hypoxemia attributed to atelectasis and left effusion. She was placed on a 55% VM with sats 90%.  She had blood pressure drop to 84/39, previously 160 in the morning. Her repeat hemoglobin was minimally lower. She denied any further pain.   Her soft blood pressures resolved on POD 4 and ranged between 120-150 systolic. Her hematocrit showed no change. She denied any abdominal pain or shortness of breath. She was kept in the ICU to work on ambulation.   She was transferred to the floor on POD 5. Her blood pressure was elevated and restarted on home dose of labetalol. She was making slow progress with physical therapy and was recommended skilled nursing placement.   On POD 6, she remained neurologically intact but felt "weak." She was agreeable to continued physical therapy. Her blood pressure was stable in 140s systolic.  She denied any pain. She was discharged to SNF on POD 6 in good condition.     CBC    Component Value Date/Time   WBC 8.9 10/01/2014 0405   RBC 2.99* 10/01/2014 0405   HGB 8.3* 10/01/2014 0405   HCT 26.5* 10/01/2014 0405   PLT 180 10/01/2014 0405   MCV 88.6 10/01/2014 0405   MCH 27.8 10/01/2014 0405    MCHC 31.3 10/01/2014 0405   RDW 16.5* 10/01/2014 0405   LYMPHSABS 1.6 08/28/2014 0844   MONOABS 0.6 08/28/2014 0844   EOSABS 0.0 08/28/2014 0844   BASOSABS 0.0 08/28/2014 0844    BMET    Component Value Date/Time   NA 141 10/01/2014 0405   K 3.9 10/01/2014 0405   CL 105 10/01/2014 0405   CO2 23 10/01/2014 0405   GLUCOSE 99 10/01/2014 0405   BUN 25* 10/01/2014 0405   CREATININE 0.90 10/01/2014 0405   CALCIUM 9.3 10/01/2014 0405   GFRNONAA 56* 10/01/2014 0405   GFRAA 65* 10/01/2014 0405     Discharge Instructions:   The patient is discharged to SNF with extensive instructions on wound care and progressive ambulation.  They are instructed not to drive or perform any heavy lifting until returning to see the physician in his office.  Discharge Instructions    ABDOMINAL PROCEDURE/ANEURYSM REPAIR/AORTO-BIFEMORAL BYPASS:  Call MD for increased abdominal pain; cramping diarrhea; nausea/vomiting    Complete by:  As directed      Call MD for:  redness, tenderness, or signs of infection (pain, swelling, bleeding, redness, odor or green/yellow discharge around incision site)    Complete by:  As directed      Call MD for:  severe or increased pain, loss or decreased feeling  in affected limb(s)    Complete by:  As directed      Call MD for:  temperature >100.5    Complete by:  As directed      Discharge wound care:    Complete by:  As directed   Shower daily with soap and water starting 10/03/14     Driving Restrictions    Complete by:  As directed   No driving for 4 weeks     Lifting restrictions    Complete by:  As directed   No lifting for 4 weeks     Resume previous diet    Complete by:  As directed            Discharge Diagnosis:  Thoracic Aortic Aneurysm  Secondary Diagnosis: Patient Active Problem List   Diagnosis Date Noted  . Thoracic aortic aneurysm 09/27/2014  . Dissecting aortic aneurysm (any part), thoracic 09/27/2014  . Aneurysm, thoracic aortic 09/26/2014    . Hyperlipidemia 09/16/2014  . Diabetes 09/13/2014  . CHF (congestive heart failure) 09/13/2014  . HTN (hypertension) 09/13/2014  . Physical deconditioning 09/12/2014  . Descending thoracic aortic aneurysm 09/08/2014  . Acute on chronic diastolic CHF (congestive heart failure), NYHA class 3 09/08/2014  . Left sided chest pain 09/08/2014  . CKD (chronic kidney disease), stage III 09/08/2014  . Acute respiratory failure with hypoxia 09/01/2014  . Acute pulmonary edema 09/01/2014  . Cardiogenic shock 09/01/2014  . Intramural aortic hematoma 08/28/2014   Past Medical History  Diagnosis Date  . Diabetes mellitus without complication   . Hypertension   . AAA (abdominal aortic aneurysm)-repaired   . Irregular heart beat   . Acute bronchitis   . Tachycardia     Unspecified  Medication List    TAKE these medications        atorvastatin 20 MG tablet  Commonly known as:  LIPITOR  Take 1 tablet (20 mg total) by mouth daily at 6 PM.     bisacodyl 5 MG EC tablet  Commonly known as:  DULCOLAX  Take 2 tablets (10 mg total) by mouth daily as needed for mild constipation or moderate constipation.     calcium carbonate 600 MG Tabs tablet  Commonly known as:  OS-CAL  Take 600 mg by mouth 2 (two) times daily with a meal.     DSS 100 MG Caps  Take 100 mg by mouth daily.     felodipine 10 MG 24 hr tablet  Commonly known as:  PLENDIL  Take 10 mg by mouth daily.     gemfibrozil 600 MG tablet  Commonly known as:  LOPID  Take 600 mg by mouth 2 (two) times daily before a meal.     HYDROcodone-acetaminophen 5-325 MG per tablet  Commonly known as:  NORCO/VICODIN  Take 1 tablet by mouth every 6 (six) hours as needed for moderate pain.     labetalol 200 MG tablet  Commonly known as:  NORMODYNE  Take 2 tablets (400 mg total) by mouth 3 (three) times daily.     metFORMIN 500 MG tablet  Commonly known as:  GLUCOPHAGE  Take 500 mg by mouth 2 (two) times daily with a meal.      SYSTANE OP  Place 1 drop into both eyes 2 (two) times daily.     Vitamin D (Ergocalciferol) 50000 UNITS Caps capsule  Commonly known as:  DRISDOL  Take 50,000 Units by mouth every 7 (seven) days. Every saturday        Percocet #20 No Refill  Disposition: SNF  Patient's condition: is Good  Follow up: 1. Dr. Myra GianottiBrabham in 4 weeks with CTA chest   Maris BergerKimberly Eliaz Fout, PA-C [facility] [phone] 10/04/2014  1:14 PM   - For VQI Registry use --- Instructions: Press F2 to tab through selections.  Delete question if not applicable.   Post-op:  Time to Extubation: [x ] In OR, [ ]  < 12 hrs, [ ]  12-24 hrs, [ ]  >=24 hrs Vasopressors Req. Post-op: No MI: No., [ ]  Troponin only, [ ]  EKG or Clinical New Arrhythmia: No CHF: No ICU Stay: 4 days Transfusion: Yes, 1 unit PRBCs    Complications: Resp failure: No., [ ]  Pneumonia, [ ]  Ventilator Chg in renal function: No., [ ]  Inc. Cr > 0.5, [ ]  Temp. Dialysis, [ ]  Permanent dialysis Leg ischemia: No., no Surgery needed, [ ]  Yes, Surgery needed, [ ]  Amputation Bowel ischemia: No., [ ]  Medical Rx, [ ]  Surgical Rx Wound complication: No., [ ]  Superficial separation/infection, [ ]  Return to OR Return to OR: No  Return to OR for bleeding: No Stroke: No., [ ]  Minor, [ ]  Major  Discharge medications: Statin use:  Yes If No: [ ]  For Medical reasons, [ ]  Non-compliant, [ ]  Not-indicated ASA use:  Yes  If No: [ ]  For Medical reasons, [ ]  Non-compliant, [ ]  Not-indicated Plavix use:  No If No: [ ]  For Medical reasons, [ ]  Non-compliant, [x]  Not-indicated Beta blocker use:  Yes If No: [ ]  For Medical reasons, [ ]  Non-compliant, [ ]  Not-indicated

## 2014-10-04 NOTE — Clinical Social Work Placement (Signed)
Clinical Social Work Department CLINICAL SOCIAL WORK PLACEMENT NOTE 10/04/2014  Patient:  Jennifer [name],Jennifer [name]  Account Number:  [id number] Admit date:  09/27/2014  Clinical Social Worker:  Katherina Wimer, LCSWA  Date/time:  10/03/2014 05:35 PM  Clinical Social Work is seeking post-discharge placement for this patient at the following level of care:   SKILLED NURSING   (*CSW will update this form in Epic as items are completed)   10/03/2014  Patient/family provided with [facility] Department of Clinical Social Work's list of facilities offering this level of care within the geographic area requested by the patient (or if unable, by the patient's family).  10/03/2014  Patient/family informed of their freedom to choose among providers that offer the needed level of care, that participate in Medicare, Medicaid or managed care program needed by the patient, have an available bed and are willing to accept the patient.  10/03/2014  Patient/family informed of [facility]' ownership interest in [facility], as well as of the fact that they are under no obligation to receive care at this facility.  PASARR submitted to EDS on 10/03/2014 PASARR number received on 10/03/2014  FL2 transmitted to all facilities in geographic area requested by pt/family on  10/03/2014 FL2 transmitted to all facilities within larger geographic area on 10/03/2014  Patient informed that his/her managed care company has contracts with or will negotiate with  certain facilities, including the following:     Patient/family informed of bed offers received:  10/03/2014 [facility] Physician recommends and patient chooses bed at    [facility] be [facility] on  10/04/2014 Patient to be transferred to facility by [facility] Patient and family notified of transfer on 10/04/2014 Name of family member notified:  Husband Jennifer [name]  The following physician request were entered in  Epic:   Additional Comments:  Jennifer [name], MSW, Theresia MajorsLCSWA [phone] 10/04/2014 4:34 PM

## 2014-10-04 NOTE — Clinical Social Work Note (Signed)
[facility] to [facility].  Patient and family agreeable to plans will transport via ems RN to call report.  Spoke to husband Marita [place], who is aware that she is discharging to [facility] as soon as EMS is able to transport patient  Windell Mouldingric Basil Blakesley, MSW, [employer]CSWA [phone]

## 2014-10-04 NOTE — Progress Notes (Signed)
  [facility] Progress Note  10/04/2014 1:05 PM 7 Days Post-Op  Subjective:  Feels that "legs are weak" and is unable to ambulate well with walker. Feels frustrated that she is not progressing more. Daughter will be here Thursday. Says she is ready for SNF today.   Filed Vitals:   10/04/14 0954  BP: 144/67  Pulse: 64  Temp:   Resp:     Physical Exam: Incisions:  Groin soft without hematoma Extremities:  5/5 strength lower extremities bilaterally. Feet are warm and well perfused.  Cardiac: regular rate and rhythm Lungs: clear to auscultation bilaterally  Abdomen: soft, mild tenderness to left lateral abdomen  CBC    Component Value Date/Time   WBC 8.9 10/01/2014 0405   RBC 2.99* 10/01/2014 0405   HGB 8.3* 10/01/2014 0405   HCT 26.5* 10/01/2014 0405   PLT 180 10/01/2014 0405   MCV 88.6 10/01/2014 0405   MCH 27.8 10/01/2014 0405   MCHC 31.3 10/01/2014 0405   RDW 16.5* 10/01/2014 0405   LYMPHSABS 1.6 08/28/2014 0844   MONOABS 0.6 08/28/2014 0844   EOSABS 0.0 08/28/2014 0844   BASOSABS 0.0 08/28/2014 0844    BMET    Component Value Date/Time   NA 141 10/01/2014 0405   K 3.9 10/01/2014 0405   CL 105 10/01/2014 0405   CO2 23 10/01/2014 0405   GLUCOSE 99 10/01/2014 0405   BUN 25* 10/01/2014 0405   CREATININE 0.90 10/01/2014 0405   CALCIUM 9.3 10/01/2014 0405   GFRNONAA 56* 10/01/2014 0405   GFRAA 65* 10/01/2014 0405    INR    Component Value Date/Time   INR 1.24 09/27/2014 2145     Intake/Output Summary (Last 24 hours) at 10/04/14 1305 Last data filed at 10/03/14 1630  Gross per 24 hour  Intake    240 ml  Output      0 ml  Net    240 ml     Assessment:  80 y.o. female is s/p: TEVAR 7 Days Post-Op  Plan: -Acute blood loss anemia: hemoglobin has been stable -Blood pressure stable. Systolic 140s.  -Neurologically intact with good lower extremity strength.  Her lower extremity "weakness" is likely due to deconditioning.  -DVT  prophylaxis:  SCDs -Dispo: D/C to SNF today -Follow up in 4 weeks with CTA chest    Maris BergerKimberly Birl Lobello, PA-C [facility] Office: [phone] Pager: [phone] 10/04/2014 1:05 PM

## 2014-10-04 NOTE — Progress Notes (Signed)
Physical Therapy Treatment Patient Details Name: Jennifer [name] MRN: [id number] DOB: 08/06/1925 Today's Date: 10/04/2014    History of Present Illness Pt is an 80 yo female who presented to ED 9/27 with chest pain. She was admitted with suspicion for aortic aneurysm vs PE. A CT of the chest showed acute intramural hemorrhage of the descending thoracic aorta s/p graft    PT Comments    Pt with decreased ambulation and activity tolerance today. Pt continues to have poor balance with posterior lean, assist needed for all mobility. Pt educated for HEP and encouraged to continue along with stand pivot transfers with nursing. Continue to recommend SNF. Will follow.    Follow Up Recommendations  SNF;Supervision for mobility/OOB     Equipment Recommendations       Recommendations for Other Services       Precautions / Restrictions Precautions Precautions: Fall Precaution Comments: posterior lean Restrictions Weight Bearing Restrictions: No    Mobility  Bed Mobility Overal bed mobility: Needs Assistance Bed Mobility: Rolling;Sidelying to Sit Rolling: Supervision Sidelying to sit: Min assist       General bed mobility comments: cues for sequence with very slight assist to elevate trunk from surface with only cues for reciprocal scooting today  Transfers Overall transfer level: Needs assistance Equipment used: Rolling walker (2 wheeled)   Sit to Stand: Mod assist Stand pivot transfers: Mod assist       General transfer comment: cues for hand placement, anterior translation, and elevation from surface with posterior bias  Ambulation/Gait Ambulation/Gait assistance: Mod assist Ambulation Distance (Feet): 10 Feet (x 2 trials with seated rest ) Assistive device: Rolling walker (2 wheeled) Gait Pattern/deviations: Step-through pattern;Decreased stride length;Trunk flexed     General Gait Details: max cues for posture, position in RW, trunk extension and anterior translation  as pt with tendency for crouched posture with posterior lean. After 10' pt starting to collapse in standing and had to have chair pulled to her   Stairs            Wheelchair Mobility    Modified Rankin (Stroke Patients Only)       Balance Overall balance assessment: Needs assistance   Sitting balance-Leahy Scale: Fair       Standing balance-Leahy Scale: Poor                      Cognition Arousal/Alertness: Awake/alert Behavior During Therapy: Flat affect Overall Cognitive Status: Impaired/Different from baseline Area of Impairment: Safety/judgement;Problem solving         Safety/Judgement: Decreased awareness of deficits;Decreased awareness of safety   Problem Solving: Slow processing;Decreased initiation;Difficulty sequencing;Requires verbal cues;Requires tactile cues      Exercises General Exercises - Lower Extremity Long Arc Quad: AROM;Both;Seated;20 reps Hip Flexion/Marching: AROM;Both;20 reps;Seated    General Comments        Pertinent Vitals/Pain Pain Assessment: No/denies pain  HR 69 sats 92% on RA throughout    Home Living                      Prior Function            PT Goals (current goals can now be found in the care plan section) Progress towards PT goals: Progressing toward goals (very slowly with decreased ambulation tolerance today)    Frequency       PT Plan Current plan remains appropriate    Co-evaluation  End of Session Equipment Utilized During Treatment: Gait belt Activity Tolerance: Patient tolerated treatment well Patient left: in chair;with call bell/phone within reach     Time: 0726-0750 PT Time Calculation (min): 24 min  Charges:  $Gait Training: 8-22 mins $Therapeutic Exercise: 8-22 mins                    G Codes:      [name], [name] 10/04/2014, 9:08 AM [name], PT [phone]

## 2014-10-04 NOTE — Clinical Social Work Psychosocial (Signed)
Clinical Social Work Department BRIEF PSYCHOSOCIAL ASSESSMENT 10/04/2014  Patient:  [name],Reema B     Account Number:  [id number]     Admit date:  09/27/2014  Clinical Social Worker:  Elouise MunroeANTERHAUS,Jacob Cicero, LCSWA  Date/Time:  10/03/2014 04:35 PM  Referred by:  Physician  Date Referred:  10/03/2014 Referred for  SNF Placement   Other Referral:   Interview type:  Patient Other interview type:    PSYCHOSOCIAL DATA Living Status:  HUSBAND Admitted from facility:   Level of care:   Primary support name:  Malachi ProVernon Fickle Primary support relationship to patient:  SPOUSE Degree of support available:   Strong support network with husband and daughter.    CURRENT CONCERNS  Other Concerns:    SOCIAL WORK ASSESSMENT / PLAN Patient is a 80 year old female who was living with her husband.  Patient is alert and oriented x3.  Patient is pleasant and talkative.  patient was originally going to go home with home health, but decided that she needs some PT to get her strength back up.  Patient and husband agreeable to placement in a SNF for short term rehab.  Patient would like to go to [facility] if a bed is available once patient is medically ready and discharge orders are received.   Assessment/plan status:   Other assessment/ plan:   Information/referral to community resources:    PATIENT'S/FAMILY'S RESPONSE TO PLAN OF CARE: Patient and husband agreeable to SNF for short term rehab.   Ervin KnackEric R. Dazhane Villagomez, MSW, Theresia MajorsLCSWA [phone] 10/04/2014 4:40 PM

## 2014-10-04 NOTE — Progress Notes (Signed)
Family notified of pending transfer to SNF, tele removed Archie BalboaStein, Laurie Penado G, RN

## 2014-10-05 ENCOUNTER — Encounter: Payer: Self-pay | Admitting: Adult Health

## 2014-10-05 ENCOUNTER — Ambulatory Visit: Payer: Medicare Other | Admitting: Surgery

## 2014-10-05 ENCOUNTER — Non-Acute Institutional Stay (SKILLED_NURSING_FACILITY): Payer: Medicare Other | Admitting: Adult Health

## 2014-10-05 DIAGNOSIS — I150 Renovascular hypertension: Secondary | ICD-10-CM

## 2014-10-05 DIAGNOSIS — E1122 Type 2 diabetes mellitus with diabetic chronic kidney disease: Secondary | ICD-10-CM

## 2014-10-05 DIAGNOSIS — N183 Chronic kidney disease, stage 3 unspecified: Secondary | ICD-10-CM

## 2014-10-05 DIAGNOSIS — N189 Chronic kidney disease, unspecified: Secondary | ICD-10-CM

## 2014-10-05 DIAGNOSIS — E785 Hyperlipidemia, unspecified: Secondary | ICD-10-CM

## 2014-10-05 DIAGNOSIS — I712 Thoracic aortic aneurysm, without rupture, unspecified: Secondary | ICD-10-CM

## 2014-10-05 DIAGNOSIS — D62 Acute posthemorrhagic anemia: Secondary | ICD-10-CM | POA: Insufficient documentation

## 2014-10-05 NOTE — Clinical Social Work Note (Signed)
Received authorization code for ambulance transport [facility] 10[facility]04/2014 [facility].  Authorization number is [id number].  Ervin KnackEric R. Anderia Lorenzo, MSW, Theresia MajorsLCSWA [phone] 10/05/2014 8:50 AM

## 2014-10-05 NOTE — Progress Notes (Signed)
Patient ID: Jennifer [name], female   DOB: 03/22/1925, 80 y.o.   MRN: [id number]   10/05/2014  Facility:  Nursing Home Location:  [facility] Room Number: 703-P LEVEL OF CARE:  SNF ([id number])   Chief Complaint  Patient presents with  . Hospitalization Follow-up    Thoracic Aneurysm S/P Repair and Thoracic Aortic Endovascular Stent Graft, Hyperlipidemia, Diabetes Mellitus, Hypertension, CKD, stage III and Anemia due to acute blood loss    HISTORY OF PRESENT ILLNESS:  This is an 80 year old female who has been admitted to [facility] on 10/04/14 from [facility] with Thoracic Aortic Aneurysm S/P Repair and Thoracic Aortic Endovascular Stent Graft. She has been admitted for a short-term rehabilitation.  REASSESSMENT OF ONGOING PROBLEMS:  HTN: Pt 's HTN remains stable.  Denies CP, sob, DOE, pedal edema, headaches, dizziness or visual disturbances.  No complications from the medications currently being used.  Last BP : 120/66  DM:pt's DM remains stable.  Pt denies polyuria, polydipsia, polyphagia, changes in vision or hypoglycemic episodes.  No complications noted from the medication presently being used.   10/15 hemoglobin A1c is: 6.8  ANEMIA: The anemia has been stable. The patient denies fatigue, melena or hematochezia. No complications from the medications currently being used. 10/15 hgb 8.3  PAST MEDICAL HISTORY:  Past Medical History  Diagnosis Date  . Diabetes mellitus without complication   . Hypertension   . AAA (abdominal aortic aneurysm)-repaired   . Irregular heart beat   . Acute bronchitis   . Tachycardia     Unspecified    CURRENT MEDICATIONS: Reviewed per MAR/see medication list  No Known Allergies   REVIEW OF SYSTEMS:  GENERAL: no change in appetite, no fatigue, no weight changes, no fever, chills or weakness RESPIRATORY: no cough, SOB, DOE, wheezing, hemoptysis CARDIAC: no chest pain, edema or palpitations GI: no abdominal  pain, diarrhea, constipation, heart burn, nausea or vomiting  PHYSICAL EXAMINATION  GENERAL: no acute distress, normal body habitus SKIN:  Left groin incision is dry, no erythema EYES: conjunctivae normal, sclerae normal, normal eye lids NECK: supple, trachea midline, no neck masses, no thyroid tenderness, no thyromegaly LYMPHATICS: no LAN in the neck, no supraclavicular LAN RESPIRATORY: breathing is even & unlabored, BS CTAB CARDIAC: RRR, no murmur,no extra heart sounds, no edema GI: abdomen soft, normal BS, no masses, no tenderness, no hepatomegaly, no splenomegaly EXTREMITIES: able to move all 4 extremities PSYCHIATRIC: the patient is alert & oriented to person, affect & behavior appropriate  LABS/RADIOLOGY: Labs reviewed: Basic Metabolic Panel:  Recent Labs  78/29/56 1935  09/02/14 0341  09/27/14 2145 09/28/14 0500 09/29/14 0029 10/01/14 0405  NA  --   < > 139  < > 139 142 136* 141  K  --   < > 4.2  < > 4.1 3.6* 4.0 3.9  CL  --   < > 100  < > 105 105 101 105  CO2  --   < > 26  < > 20 22 21 23   GLUCOSE  --   < > 163*  < > 116* 133* 133* 99  BUN  --   < > 26*  < > 20 16 15  25*  CREATININE  --   < > 1.13*  < > 1.13* 0.99 1.02 0.90  CALCIUM  --   < > 9.2  < > 9.3 9.3 9.3 9.3  MG 1.7  --  2.1  --  1.6  --   --   --  PHOS  --   --  4.0  --   --   --   --   --   < > = values in this interval not displayed. Liver Function Tests:  Recent Labs  08/28/14 0844 09/27/14 1630  AST 13 10  ALT 11 8  ALKPHOS 50 62  BILITOT 0.2* 0.3  PROT 8.0 7.4  ALBUMIN 4.2 3.4*    Recent Labs  08/28/14 0844  LIPASE 42   CBC:  Recent Labs  08/28/14 0844  09/30/14 1520 09/30/14 2105 10/01/14 0405  WBC 12.1*  < > 10.9* 10.1 8.9  NEUTROABS 9.9*  --   --   --   --   HGB 12.0  < > 8.4* 7.8* 8.3*  HCT 37.6  < > 26.6* 24.8* 26.5*  MCV 91.9  < > 88.4 88.3 88.6  PLT 230  < > 170 172 180  < > = values in this interval not displayed.  Lipid Panel:  Recent Labs  08/29/14 0025    HDL 33*   Cardiac Enzymes:  Recent Labs  09/01/14 1541 09/01/14 2141 09/02/14 0341  TROPONINI <0.30 <0.30 <0.30   CBG:  Recent Labs  10/04/14 0703 10/04/14 1113 10/04/14 1616  GLUCAP 103* 107* 92   Dg Chest 1 View  09/27/2014   CLINICAL DATA:  Aortic aneurysm repair.  EXAM: DG C-ARM GT 120 MIN; CHEST - 1 VIEW  TECHNIQUE: Intraoperative aortogram spot images obtained for review.  FLUOROSCOPY TIME:  29 min 16 seconds.  COMPARISON:  CT 09/26/2014.  FINDINGS: The aortic stent graft is noted traversing the descending aorta for repair of the previously identified large descending thoracic aortic aneurysm. The aortic stent graft is widely patent. The great vessels are widely patent. No definite complicating features are noted.  IMPRESSION: Aortic stent graft repair of descending thoracic aortic aneurysm .   Electronically Signed   By: Maisie Fus  Register   On: 09/27/2014 21:11   Dg Chest 2 View  09/06/2014   CLINICAL DATA:  Pleural effusions, history diabetes mellitus without complication, hypertension, abdominal aortic aneurysm  EXAM: CHEST  2 VIEW  COMPARISON:  09/02/2014  FINDINGS: LEFT jugular central venous catheter with tip projecting over confluence of the SVC and LEFT brachiocephalic vein.  Enlargement of cardiac silhouette.  Atherosclerotic calcification aorta.  Mediastinal contours and pulmonary vascularity normal.  Slightly improved atelectasis versus consolidation in LEFT lower lobe.  Minimal RIGHT base atelectasis.  Upper lungs clear.  No pneumothorax.  Minimal LEFT pleural effusion.  IMPRESSION: Improving aeration LEFT lower lobe.  Enlargement of cardiac silhouette with minimal RIGHT base atelectasis.   Electronically Signed   By: Ulyses Southward M.D.   On: 09/06/2014 14:18   Nm Myocar Multi W/spect W/wall Motion / Ef  09/08/2014   CLINICAL DATA:  Chest pain  EXAM: MYOCARDIAL IMAGING WITH SPECT (REST AND PHARMACOLOGIC-STRESS)  GATED LEFT VENTRICULAR WALL MOTION STUDY  LEFT VENTRICULAR  EJECTION FRACTION  TECHNIQUE: Standard myocardial SPECT imaging was performed after resting intravenous injection of 10 mCi Tc-2m sestamibi. Subsequently, intravenous infusion of Lexiscan was performed under the supervision of the Cardiology staff. At peak effect of the drug, 30 mCi Tc-34m sestamibi was injected intravenously and standard myocardial SPECT imaging was performed. Quantitative gated imaging was also performed to evaluate left ventricular wall motion, and estimate left ventricular ejection fraction.  COMPARISON:  None.  FINDINGS: Baseline EKG:  NSR with RBBB  EKG during Lexiscan infusion: NSR with RBBB with no ST changes. The patient had  chest pain during the infusion as well as SOB  RAW images deomnstrated increased gut uptake below the diaphragm and mild diaphragmatic attenuation  Perfusion: On rest images there is a small in size, mild intensity defect in the apical inferoseptum . There is small in size, mild in intensity defect in the mid and basal inferolateral wall. Stress images show small in size and mild in intensity defects in the apical inferoseptum and mid and basal inferolateral wall. These defects appear fixed with no ischemia.  Wall Motion: Normal left ventricular wall motion. No left ventricular dilation.  Left Ventricular Ejection Fraction: 73 %  End diastolic volume 79 ml  End systolic volume 22 ml  IMPRESSION: 1. Small sized, mild in intensity fixed defects noted in the apical inferoseptum and mid and basal inferolateral walls consistent with diaphragmatic attenuation. No reversible ischemia or infarction.  2. Normal left ventricular wall motion.  3. Left ventricular ejection fraction 73%  4. Low-risk stress test findings.  *2012 Appropriate Use Criteria for Coronary Revascularization Focused Update: J Am Coll Cardiol. 2012;59(9):857-881. http://content.dementiazones.comonlinejacc.org/article.aspx?articleid=1201161   Electronically Signed   By: Armanda Magicraci  Turner   On: 09/08/2014 16:48   Dg Chest Port 1  View  10/01/2014   CLINICAL DATA:  Pleural effusion.  EXAM: PORTABLE CHEST - 1 VIEW  COMPARISON:  09/30/2014 and 09/27/2014 and CT scan dated 09/29/2014  FINDINGS: Sheath remains in the upper superior vena cava.  Persistent slight atelectasis at the right lung base with a small effusion.  Slight improvement in the atelectasis and effusion at the left base. Overall heart size is normal. Vascularity is normal.  IMPRESSION: Decreased atelectasis and effusion at the left base. No other change.   Electronically Signed   By: Geanie CooleyJim  Maxwell M.D.   On: 10/01/2014 06:04   Dg Chest Port 1 View  09/30/2014   CLINICAL DATA:  Thoracic aortic aneurysm with endovascular stent graft placement  EXAM: PORTABLE CHEST - 1 VIEW  COMPARISON:  Chest radiograph September 27, 2014 and chest CT September 29, 2014  FINDINGS: Aortic stent graft is again noted. There is persistent left lower lobe consolidation with left effusion. There is a minimal right effusion with mild right base atelectasis. Heart is enlarged. The pulmonary vascularity is within normal limits. Cordis tip is in the superior vena cava. No pneumothorax. No bone lesions.  IMPRESSION: No change in cardiac silhouette. Persistent consolidation left lower lobe with small effusion on the left. Minimal right effusion with right base atelectatic change, stable. No pneumothorax.   Electronically Signed   By: Bretta BangWilliam  Woodruff M.D.   On: 09/30/2014 07:57   Dg Chest Port 1 View  09/27/2014   CLINICAL DATA:  Aortic stent graft.  EXAM: PORTABLE CHEST - 1 VIEW  COMPARISON:  CT 09/26/2014 .  Chest x-ray 09/06/2014.  FINDINGS: Right IJ sheath in good anatomic position. Left IJ with line noted with its tip in the right atrium. A inferior vena cava line may be present with its tip in the right atrium. Clinical correlation is suggested. Aortic stent graft noted. Cardiomegaly with normal pulmonary vascularity. Bibasilar atelectasis. left perihilar and left lower lobe infiltrate cannot be  excluded. No pneumothorax. No acute bony abnormality.  IMPRESSION: 1. Aortic stent graft. 2. Right IJ sheath in good anatomic position. Left IJ line noted with tip projected over the right atrium. A inferior vena caval line appears to be present with its tip in the right atrium. 3. Bibasilar atelectasis. Left perihilar left lower lobe infiltrate cannot be excluded.  Electronically Signed   By: Maisie Fushomas  Register   On: 09/27/2014 21:51   Dg C-arm Gt 120 Min  09/27/2014   CLINICAL DATA:  Aortic aneurysm repair.  EXAM: DG C-ARM GT 120 MIN; CHEST - 1 VIEW  TECHNIQUE: Intraoperative aortogram spot images obtained for review.  FLUOROSCOPY TIME:  29 min 16 seconds.  COMPARISON:  CT 09/26/2014.  FINDINGS: The aortic stent graft is noted traversing the descending aorta for repair of the previously identified large descending thoracic aortic aneurysm. The aortic stent graft is widely patent. The great vessels are widely patent. No definite complicating features are noted.  IMPRESSION: Aortic stent graft repair of descending thoracic aortic aneurysm .   Electronically Signed   By: Maisie Fushomas  Register   On: 09/27/2014 21:11   Ct Angio Chest Aortic Dissect W &/or W/o  09/29/2014   CLINICAL DATA:  Thoracic aortic aneurysm.  EXAM: CT ANGIOGRAPHY CHEST, ABDOMEN AND PELVIS  TECHNIQUE: Multidetector CT imaging through the chest, abdomen and pelvis was performed using the standard protocol during bolus administration of intravenous contrast. Multiplanar reconstructed images and MIPs were obtained and reviewed to evaluate the vascular anatomy.  CONTRAST:  100mL OMNIPAQUE IOHEXOL 350 MG/ML SOLN  COMPARISON:  CT scan of August 28, 2014.  FINDINGS: CTA CHEST FINDINGS  Moderate left pleural effusion is noted with atelectasis of the left lower lobe. Mild right pleural effusion is noted with adjacent subsegmental atelectasis of right lower lobe. Stable 10 mm nodule is noted in right middle lobe. No pneumothorax is noted. There is no  evidence of pulmonary embolus. Patient is status post endo graft placement of the thoracic aorta, with the proximal ingest distal to the origin of the left subclavian artery, and distal and extending to just above the aortic hiatus. No endoleak is noted. Old intramural hemorrhage is seen surrounding the endo graft in the proximal portion of the descending thoracic aorta. No active extravasation is seen at this time. The aorta measures 6.5 x 5.9 cm at this level. Distally, the descending thoracic aorta measures 3.9 x 3.6 cm.  The origins of the great vessels are widely patent. 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 The left vertebral artery appears to arise from the true lumen. The dissection is seen to extend through the remaining portion of the left subclavian artery to the left axillary artery.  Review of the MIP images confirms the above findings.  CTA ABDOMEN AND PELVIS FINDINGS  No significant abnormality is noted in the liver, spleen or pancreas. No gallstones are noted. Adrenal glands appear normal. No hydronephrosis or renal obstruction is noted. Bilateral cortical scarring is seen involving both kidneys. Nonobstructive calculus is noted in lower pole collecting system of left kidney. Focal hyper enhancement is noted involving the cortex of the lower pole the right kidney which was not present on prior exam and is felt to represent artifact. Stool is noted in the right and transverse colon. Sigmoid diverticulosis is noted without inflammation. No abnormal fluid collection is noted in the abdomen or pelvis. Urinary bladder is decompressed secondary to Foley catheter.  There is no evidence of dissection involving the abdominal aorta. Status post surgical repair of abdominal aortic aneurysm. The aorta is widely patent without significant stenosis. No  endoleak is noted. Maximum measured AP diameter of 3 cm is noted. Atherosclerotic calcifications are noted. Severe stenosis is seen  involving the origin of the celiac artery with poststenotic dilatation. 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 Superior mesenteric artery is widely patent. Inferior mesenteric artery is not visualized and presumably is occluded. Both renal arteries are widely patent. Moderate calcified plaque is seen involving the origin of the left renal artery resulting in moderate stenosis.  Moderate focal stenosis is seen involving the midportion of the right external iliac artery. Severe stenosis is seen involving the left common femoral artery at its bifurcation.  Review of the MIP images confirms the above findings.  IMPRESSION: Status post endo graft repair of thoracic aortic aneurysm with intramural hemorrhage. No endoleak or active extravasation is seen at this time.  There is the interval development of dissection involving the left subclavian artery just beyond its origin which extends slightly into the origin of the left internal mammary artery, and is seen to extend through the remaining portion of the left subclavian artery and into the left axillary artery.  Moderate left pleural effusion is noted with associated atelectasis of the left lower lobe. Mild right pleural effusion is noted with adjacent subsegmental atelectasis of the right lower lobe.  Stable right middle lobe nodule is noted.  Status post surgical repair of abdominal aortic aneurysm. No dissection or hemorrhage is seen involving the abdominal aorta. 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 Superior mesenteric and renal arteries are patent, although moderate calcified plaque is noted at the origin of the left renal artery. Inferior mesenteric artery appears to be excluded at its origin.  Moderate focal stenosis is seen in the midportion of the right  external iliac artery, as well as severe stenosis seen in the left common femoral artery at its bifurcation.  I was unable to contact physician to discuss these findings. These results will be called to the ordering clinician or representative by the Radiologist Assistant, and communication documented in the PACS or zVision Dashboard.   Electronically Signed   By: Roque Lias M.D.   On: 09/29/2014 10:01   Ct Angio Chest Aorta W/cm &/or Wo/cm  09/26/2014   CLINICAL DATA:  Evaluate known thoracic aortic aneurysm. Patient was complaining of upper back pain earlier. Now without pain or symptoms.  EXAM: CT ANGIOGRAPHY CHEST WITH CONTRAST  TECHNIQUE: Multidetector CT imaging of the chest was performed using the standard protocol during bolus administration of intravenous contrast. Multiplanar CT image reconstructions and MIPs were obtained to evaluate the vascular anatomy.  CONTRAST:  80mL OMNIPAQUE IOHEXOL 350 MG/ML SOLN  COMPARISON:  09/01/2014.  FINDINGS: There is a fusiform aneurysm of the descending thoracic aorta measuring 7 cm in length and 5.5 cm in greatest anterior-posterior dimension. This was measured on the sagittal reconstructed image. In the same location on the prior study, the aneurysm measured 4.9 cm anterior posterior. Transversely, at the level of the right pulmonary artery aneurysm previously measured 5.2 cm x 5.1 cm, currently 5.6 cm x 5.3 cm.  No aortic dissection. The ascending aorta and aortic arch are normal in caliber. The distal thoracic aorta is tortuous, the coming normal in caliber below the aneurysm and at the aortic hiatus. There is mild atherosclerotic plaque and calcification throughout the thoracic aorta. Some mural thrombus is noted along the aneurysm. This is stable.  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  Heart is mildly enlarged. There are dense coronary artery calcifications. Pulmonary arteries are normal in caliber. Multiple low-density areas are noted in  the  thyroid gland consistent with multiple hypo attenuating nodules. These stable. No neck base or axillary adenopathy.  No mediastinal or hilar masses or adenopathy.  10 mm x 9 mm oval circumscribed nodule in the right middle lobe just below the minor fissure, stable. No other lung nodules. Mild subsegmental atelectasis at the lung bases. Lungs otherwise clear. Pleural effusions and more significant atelectasis noted on the prior study have resolved. No pneumothorax.  Partial visualization of the upper abdomen reveals a small hypo attenuating lesion at the dome of the right lobe of the liver measuring 11 mm, stable. Remainder the visualized liver unremarkable. Spleen is unremarkable. Visualized portions of the pancreas and gallbladder are unremarkable. No bile duct dilation. Stable, indeterminate, 16 mm right adrenal mass. Low-density lesion arising from the anterior midpole of the left kidney, likely cysts, also stable. Bilateral renal cortical thinning  Bony thorax is demineralized. There are mild degenerative changes of the thoracic spine.  Review of the MIP images confirms the above findings.  IMPRESSION: 1. Fusiform, descending thoracic aortic aneurysm. This has increased in size from the prior study currently measuring 5.6 cm x 4.3 cm where it had measured 5.2 cm x 5.1 cm. 2. Area of mural hematoma noted on the prior study is less hyper attenuating, with only a small focus of mild hyperattenuation persisting. This is consistent with normal evolution of the mural hematoma. As noted previously, intimal calcifications are displaced to a from the wall prior the area mural hematoma. 3. Bilateral effusions and significant compressive atelectasis noted on the prior study have resolved. There is minor lung base subsegmental atelectasis. A stable of mm nodule lies in the right middle lobe. 4. 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 5. Other chronic findings as detailed, unchanged from the recent prior  exam.   Electronically Signed   By: Amie Portland M.D.   On: 09/26/2014 16:52   Ct Angio Abd/pel W/ And/or W/o  09/29/2014   CLINICAL DATA:  Thoracic aortic aneurysm.  EXAM: CT ANGIOGRAPHY CHEST, ABDOMEN AND PELVIS  TECHNIQUE: Multidetector CT imaging through the chest, abdomen and pelvis was performed using the standard protocol during bolus administration of intravenous contrast. Multiplanar reconstructed images and MIPs were obtained and reviewed to evaluate the vascular anatomy.  CONTRAST:  OMNIPAQUE IOHEXOL 350 MG/ML SOLN  COMPARISON:  CT scan of August 28, 2014.  FINDINGS: CTA CHEST FINDINGS  Moderate left pleural effusion is noted with atelectasis of the left lower lobe. Mild right pleural effusion is noted with adjacent subsegmental atelectasis of right lower lobe. Stable 10 mm nodule is noted in right middle lobe. No pneumothorax is noted. There is no evidence of pulmonary embolus. Patient is status post endo graft placement of the thoracic aorta, with the proximal ingest distal to the origin of the left subclavian artery, and distal and extending to just above the aortic hiatus. No endoleak is noted. Old intramural hemorrhage is seen surrounding the endo graft in the proximal portion of the descending thoracic aorta. No active extravasation is seen at this time. The aorta measures 6.5 x 5.9 cm at this level. Distally, the descending thoracic aorta measures 3.9 x 3.6 cm.  The origins of the great vessels are widely patent. 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 The left vertebral artery appears to arise from the true lumen. The dissection is  seen to extend through the remaining portion of the left subclavian artery to the left axillary artery.  Review of the MIP images confirms the above findings.  CTA ABDOMEN AND PELVIS FINDINGS  No  significant abnormality is noted in the liver, spleen or pancreas. No gallstones are noted. Adrenal glands appear normal. No hydronephrosis or renal obstruction is noted. Bilateral cortical scarring is seen involving both kidneys. Nonobstructive calculus is noted in lower pole collecting system of left kidney. Focal hyper enhancement is noted involving the cortex of the lower pole the right kidney which was not present on prior exam and is felt to represent artifact. Stool is noted in the right and transverse colon. Sigmoid diverticulosis is noted without inflammation. No abnormal fluid collection is noted in the abdomen or pelvis. Urinary bladder is decompressed secondary to Foley catheter.  There is no evidence of dissection involving the abdominal aorta. Status post surgical repair of abdominal aortic aneurysm. The aorta is widely patent without significant stenosis. No endoleak is noted. Maximum measured AP diameter of 3 cm is noted. Atherosclerotic calcifications are noted. 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 Superior mesenteric artery is widely patent. Inferior mesenteric artery is not visualized and presumably is occluded. Both renal arteries are widely patent. Moderate calcified plaque is seen involving the origin of the left renal artery resulting in moderate stenosis.  Moderate focal stenosis is seen involving the midportion of the right external iliac artery. Severe stenosis is seen involving the left common femoral artery at its bifurcation.  Review of the MIP images confirms the above findings.  IMPRESSION: Status post endo graft repair of thoracic aortic aneurysm with intramural hemorrhage. No endoleak or active extravasation is seen at this time.  There is the interval development of dissection involving the left subclavian artery just beyond its origin which extends slightly into the  origin of the left internal mammary artery, and is seen to extend through the remaining portion of the left subclavian artery and into the left axillary artery.  Moderate left pleural effusion is noted with associated atelectasis of the left lower lobe. Mild right pleural effusion is noted with adjacent subsegmental atelectasis of the right lower lobe.  Stable right middle lobe nodule is noted.  Status post surgical repair of abdominal aortic aneurysm. No dissection or hemorrhage is seen involving the abdominal aorta. 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 Superior mesenteric and renal arteries are patent, although moderate calcified plaque is noted at the origin of the left renal artery. Inferior mesenteric artery appears to be excluded at its origin.  Moderate focal stenosis is seen in the midportion of the right external iliac artery, as well as severe stenosis seen in the left common femoral artery at its bifurcation.  I was unable to contact physician to discuss these findings. These results will be called to the ordering clinician or representative by the Radiologist Assistant, and communication documented in the PACS or zVision Dashboard.   Electronically Signed   By: Roque Lias M.D.   On: 09/29/2014 10:01    ASSESSMENT/PLAN:  Thoracic aortic aneurysm status post repair/horacic aortic endovascular stent graft - for rehabilitation Hyperlipidemia - continue Lipitor and Lopid Diabetes mellitus, type II - well controlled; continue Glucophage Hypertension - continue Plendil and Normodyne; BP every shift 1 week Chronic kidney disease, stage III - check CMP  Anemia, acute blood loss -  check CBC   Spent 50 minutes in patient care.    [facility], NP [employer] [phone]

## 2014-10-06 ENCOUNTER — Telehealth: Payer: Self-pay | Admitting: Surgery

## 2014-10-06 ENCOUNTER — Encounter: Payer: Self-pay | Admitting: Internal Medicine

## 2014-10-06 ENCOUNTER — Non-Acute Institutional Stay (SKILLED_NURSING_FACILITY): Payer: Medicare Other | Admitting: Internal Medicine

## 2014-10-06 DIAGNOSIS — D62 Acute posthemorrhagic anemia: Secondary | ICD-10-CM

## 2014-10-06 DIAGNOSIS — E1122 Type 2 diabetes mellitus with diabetic chronic kidney disease: Secondary | ICD-10-CM

## 2014-10-06 DIAGNOSIS — N189 Chronic kidney disease, unspecified: Secondary | ICD-10-CM

## 2014-10-06 DIAGNOSIS — I150 Renovascular hypertension: Secondary | ICD-10-CM

## 2014-10-06 DIAGNOSIS — E785 Hyperlipidemia, unspecified: Secondary | ICD-10-CM

## 2014-10-06 DIAGNOSIS — I7123 Aneurysm of the descending thoracic aorta, without rupture: Secondary | ICD-10-CM

## 2014-10-06 DIAGNOSIS — K5900 Constipation, unspecified: Secondary | ICD-10-CM

## 2014-10-06 DIAGNOSIS — R531 Weakness: Secondary | ICD-10-CM

## 2014-10-06 DIAGNOSIS — H04123 Dry eye syndrome of bilateral lacrimal glands: Secondary | ICD-10-CM

## 2014-10-06 DIAGNOSIS — I712 Thoracic aortic aneurysm, without rupture: Secondary | ICD-10-CM

## 2014-10-06 NOTE — Telephone Encounter (Addendum)
-----   Message from Sharee PimpleMarilyn K McChesney, RN sent at 10/04/2014  4:41 PM EST ----- Regarding: Schedule   ----- Message -----    From: Raymond GurneyKimberly A Trinh, PA-C    Sent: 10/04/2014   3:51 PM      To: [facility] Charge Pool  S/p TEVAR 09/27/14  F/u in 4 weeks with Dr Myra GianottiBrabham with [facility] chest.   Thanks, Kim   10/06/14: patient does not have a voicemail. I have tried to reach her multiple times. I have mailed the CT letter. dpm

## 2014-10-06 NOTE — Progress Notes (Addendum)
Patient ID: Jennifer [name], female   DOB: 07/15/1925, 80 y.o.   MRN: [id number]     [facility]  PCP: [name], KAREN, MD  Code Status: full code  No Known Allergies  Chief Complaint: new admit  HPI:  80 y/o female patient is here for STR after hospital admission from 09/27/14-10/04/14 with thoracic aortic aneurysm and back pain. She underwent endovascular repair of descending thoracic aortic aneurysm, had 1 u prbc transfused, bp meds were adjusted and was then transferred to SNF for rehabilitation due to her weakness.  Of note she was in the hospital from 08/28/2014-09/12/2014 with left-sided chest pain and was noted to have acute intramural hemorrhage of the descending thoracic aorta with an associated 5 cm aneurysm. She was managed medically and sent home. was ultimately able to be discharged home.   She has PMH of open abdominal aortic aneurysm repair, DM, HLD, diastolic CHF, ckd stage 3 She is seen in her room today. She is aaox 3. She feels tired. She mentions getting her hair done this am. Denies any other concern. No concern from staff  Review of Systems:  Constitutional: Negative for fever, chills, diaphoresis.  HENT: Negative for congestion Eyes: Negative for eye pain, blurred vision, double vision and discharge.  Respiratory: Negative for cough, sputum production, shortness of breath and wheezing.   Cardiovascular: Negative for palpitations, orthopnea and leg swelling. has occasional chest pain, denies radiation and the pain is central. No pain at present Gastrointestinal: Negative for heartburn, nausea, vomiting, abdominal pain, diarrhea and constipation.  Genitourinary: Negative for dysuria Musculoskeletal: Negative for falls Skin: Negative for itching and rash.  Neurological: Negative for dizziness and headaches.  Psychiatric/Behavioral: Negative for depression and memory loss.     Past Medical History  Diagnosis Date  . Diabetes  mellitus without complication   . Hypertension   . AAA (abdominal aortic aneurysm)-repaired   . Irregular heart beat   . Acute bronchitis   . Tachycardia     Unspecified   Past Surgical History  Procedure Laterality Date  . Abdominal hysterectomy    . Abdominal aortic aneurysm repair    . Breast lumpectomy Left     Benign Mass  . Thoracic aortic endovascular stent graft N/A 09/27/2014    Procedure: THORACIC AORTIC ENDOVASCULAR STENT GRAFT;  Surgeon: Nada LibmanVance W Brabham, MD;  [facility];  Service: Vascular;  Laterality: N/A;   Social History:   reports that she has never smoked. She does not have any smokeless tobacco history on file. She reports that she does not drink alcohol or use illicit drugs.  History reviewed. No pertinent family history.  Medications: Patient's Medications  New Prescriptions   No medications on file  Previous Medications   ASPIRIN EC 81 MG TABLET    Take 1 tablet (81 mg total) by mouth daily.   ATORVASTATIN (LIPITOR) 20 MG TABLET    Take 1 tablet (20 mg total) by mouth daily at 6 PM.   BISACODYL (DULCOLAX) 5 MG EC TABLET    Take 2 tablets (10 mg total) by mouth daily as needed for mild constipation or moderate constipation.   CALCIUM CARBONATE (OS-CAL) 600 MG TABS TABLET    Take 600 mg by mouth 2 (two) times daily with a meal.   DOCUSATE SODIUM 100 MG CAPS    Take 100 mg by mouth daily.   FELODIPINE (PLENDIL) 10 MG 24 HR TABLET    Take 10 mg by mouth daily.  GEMFIBROZIL (LOPID) 600 MG TABLET    Take 600 mg by mouth 2 (two) times daily before a meal.    HYDROCODONE-ACETAMINOPHEN (NORCO/VICODIN) 5-325 MG PER TABLET    Take 1 tablet by mouth every 6 (six) hours as needed for moderate pain.   LABETALOL (NORMODYNE) 200 MG TABLET    Take 2 tablets (400 mg total) by mouth 3 (three) times daily.   METFORMIN (GLUCOPHAGE) 500 MG TABLET    Take 500 mg by mouth 2 (two) times daily with a meal.    POLYETHYL GLYCOL-PROPYL GLYCOL (SYSTANE OP)    Place 1 drop into  both eyes 2 (two) times daily.   VITAMIN D, ERGOCALCIFEROL, (DRISDOL) 50000 UNITS CAPS CAPSULE    Take 50,000 Units by mouth every 7 (seven) days. Every saturday  Modified Medications   No medications on file  Discontinued Medications   No medications on file     Physical Exam: Filed Vitals:   10/06/14 0940  BP: 144/86  Pulse: 67  Temp: 98.5 F (36.9 C)  Resp: 16  SpO2: 97%   General- elderly female in no acute distress Head- atraumatic, normocephalic Eyes- PERRLA, EOMI, no pallor, no icterus, no discharge Neck- no lymphadenopathy Cardiovascular- normal s1,s2, no murmurs Respiratory- bilateral clear to auscultation Abdomen- bowel sounds present, soft, non tender, surgical scars Musculoskeletal- able to move all 4 extremities, on wheelchair and needs assistance with transfers, no leg edema Neurological- no focal deficit,aaox 3 Skin- warm and dry, band aid on left groin area with incision area clean and dry, healed well Psychiatry- normal mood and affect    Labs reviewed: Basic Metabolic Panel:  Recent Labs  96/04/54 1935  09/02/14 0341  09/27/14 2145 09/28/14 0500 09/29/14 0029 10/01/14 0405  NA  --   < > 139  < > 139 142 136* 141  K  --   < > 4.2  < > 4.1 3.6* 4.0 3.9  CL  --   < > 100  < > 105 105 101 105  CO2  --   < > 26  < > 20 22 21 23   GLUCOSE  --   < > 163*  < > 116* 133* 133* 99  BUN  --   < > 26*  < > 20 16 15  25*  CREATININE  --   < > 1.13*  < > 1.13* 0.99 1.02 0.90  CALCIUM  --   < > 9.2  < > 9.3 9.3 9.3 9.3  MG 1.7  --  2.1  --  1.6  --   --   --   PHOS  --   --  4.0  --   --   --   --   --   < > = values in this interval not displayed. Liver Function Tests:  Recent Labs  08/28/14 0844 09/27/14 1630  AST 13 10  ALT 11 8  ALKPHOS 50 62  BILITOT 0.2* 0.3  PROT 8.0 7.4  ALBUMIN 4.2 3.4*    Recent Labs  08/28/14 0844  LIPASE 42   No results for input(s): AMMONIA in the last 8760 hours. CBC:  Recent Labs  08/28/14 0844   09/30/14 1520 09/30/14 2105 10/01/14 0405  WBC 12.1*  < > 10.9* 10.1 8.9  NEUTROABS 9.9*  --   --   --   --   HGB 12.0  < > 8.4* 7.8* 8.3*  HCT 37.6  < > 26.6* 24.8* 26.5*  MCV 91.9  < >  88.4 88.3 88.6  PLT 230  < > 170 172 180  < > = values in this interval not displayed. Cardiac Enzymes:  Recent Labs  09/01/14 1541 09/01/14 2141 09/02/14 0341  TROPONINI <0.30 <0.30 <0.30   BNP: Invalid input(s): POCBNP CBG:  Recent Labs  10/04/14 0703 10/04/14 1113 10/04/14 1616  GLUCAP 103* 107* 92    Assessment/Plan  Generalized weakness In setting of physical deconditioning. Will have her work with physical therapy and occupational therapy team to help with gait training and muscle strengthening exercises.fall precautions. Skin care. Encourage to be out of bed. Continue extra protective barrier cream to the sacrum area  Thoracic aneurysm S/p repair. Pain controlled. Continue norco 5-325 q6h prn pain. No chest pain at present. monitor  HLD Continue lipitor 20 mg daily and lopid  Constipation Stable. Continue colace 100 mg daily and prn dulcolax 2 tab   HTN Controlled. Continue Labetalol 400 mg tid, felodipine 10 mg daily. Monitor bp  Dry eyes systane eye drops  DM Continue glucophage 500 mg bid, cbg monitor  Anemia S/p transfusion. Check cbc   Family/ staff Communication: reviewed care plan with patient and nursing supervisor   Goals of care: short term rehabilitation    Labs/tests ordered- cbc, cmp    Oneal GroutMAHIMA Aaiden Depoy, MD  [facility] [phone] (Monday-Friday 8 am - 5 pm) [phone] (afterhours)   Addendum:  To make appointment with dr Myra Gianottibrabham office in 4 weeks for post up follow up with [facility] chest

## 2014-10-25 ENCOUNTER — Ambulatory Visit: Payer: Medicare Other | Admitting: Interventional Cardiology

## 2014-10-26 ENCOUNTER — Encounter: Payer: Self-pay | Admitting: Surgery

## 2014-10-31 ENCOUNTER — Ambulatory Visit
Admission: RE | Admit: 2014-10-31 | Discharge: 2014-10-31 | Disposition: A | Discharge status: Home / Self Care | Payer: Medicare Other | Source: Ambulatory Visit | Attending: Surgery | Admitting: Surgery

## 2014-10-31 ENCOUNTER — Encounter: Payer: Self-pay | Admitting: Surgery

## 2014-10-31 ENCOUNTER — Other Ambulatory Visit: Payer: Medicare Other

## 2014-10-31 ENCOUNTER — Ambulatory Visit (INDEPENDENT_AMBULATORY_CARE_PROVIDER_SITE_OTHER): Payer: Self-pay | Admitting: Surgery

## 2014-10-31 VITALS — BP 170/78 | HR 68 | Temp 97.9°F | Resp 16 | Ht 63.0 in | Wt 133.0 lb

## 2014-10-31 DIAGNOSIS — Z48812 Encounter for surgical aftercare following surgery on the circulatory system: Secondary | ICD-10-CM

## 2014-10-31 DIAGNOSIS — I712 Thoracic aortic aneurysm, without rupture, unspecified: Secondary | ICD-10-CM

## 2014-10-31 MED ORDER — IOHEXOL 350 MG/ML SOLN
75.0000 mL | Freq: Once | INTRAVENOUS | Status: AC | PRN
  Administered 2014-10-31: 75 mL via INTRAVENOUS

## 2014-10-31 NOTE — Progress Notes (Signed)
The patient comes in today for her first postoperative follow-up.  She initially presented with left-sided chest pain.  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  She was treated medically for this and discharged to home with follow-up in my office.  When I evaluated her, she was having persistent back pain which had gotten worse on the day that I saw her.  I repeated her CT scan which did not show any significant changes.  She was scheduled for expedited endovascular repair of her aneurysm.  On 09/27/2014 she underwent endovascular repair of her aneurysm using a Gore CTAG device.  She had a distal extension placed as well.  This was done through left common femoral artery access.  Following her procedure she did have persistent pain.  A CT scan was obtained which showed that the stent graft was in good position and that the aneurysm was completely excluded.  She did have a new finding of a subclavian artery dissection as well as narrowing of her left distal external / common femoral artery.  She was ultimately discharged to a nursing home.  She comes in today for follow-up.  She is eager to get back to her home and leave the nursing facility.  She is able to walk on her own although she spends a lot of time in a wheelchair.  She gets occasional pain in her back which she attributes to musculoskeletal issues.  On examination her groin has healed completely.  She has a briskly palpable left dorsalis pedis pulse.  I have reviewed her CT scan from today.  This shows that the stent graft is in good position.  The aneurysm has been successfully excluded.  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  I have discussed the CT scan findings with the patient.  She is completely asymptomatic from her left subclavian dissection.  She has no arm weakness and no neurologic issues.  I suspect that the finding in the left groin on the  initial postoperative CT scan was related to her closure device.  She does not have any limitations from this and she has a palpable pedal pulse.  When she follows up in 6 months I will get a CT scan of the chest abdomen and pelvis to further evaluate both of these areas.  The patient is very tearful today and wants to go home.  From my perspective she appears to be stable enough to be discharged from her nursing home, however I will then make that decision.  Should she have symptoms of claudication which she does not at this time she would need an ultrasound of her left groin.  In 6 months

## 2014-11-02 ENCOUNTER — Telehealth: Payer: Self-pay

## 2014-11-02 NOTE — Telephone Encounter (Signed)
left message for Shanda BumpsJessica at nursing home to fax records

## 2014-11-04 ENCOUNTER — Other Ambulatory Visit: Payer: Medicare Other

## 2014-11-04 ENCOUNTER — Ambulatory Visit (INDEPENDENT_AMBULATORY_CARE_PROVIDER_SITE_OTHER): Payer: Medicare Other | Admitting: Cardiology

## 2014-11-04 ENCOUNTER — Encounter: Payer: Self-pay | Admitting: Cardiology

## 2014-11-04 VITALS — BP 173/84 | HR 88 | Ht 63.0 in | Wt 132.8 lb

## 2014-11-04 DIAGNOSIS — I10 Essential (primary) hypertension: Secondary | ICD-10-CM

## 2014-11-04 NOTE — Progress Notes (Signed)
11/04/2014 Gershon MusselJanie B Hardenbrook   08/10/1925  [id number]  Primary Physician [name], KAREN, MD Primary Cardiologist: Dr. Eldridge DaceVaranasi  HPI:  Mrs. Petersen presents to clinic today regarding issues with her blood pressure. She is a patient of Dr. Hoyle BarrVaranasi's. She was recently hospitalized from 08/28/14-09/12/14 with left-sided chest pain and was found to have an intramural aortic hematoma. She was also found to have descending thoracic aortic aneurysm. She also had acute pulmonary edema and cardiogenic shock. Additional PMH includes chronic diastolic heart failure and history of chronic kidney disease stage III.  During her hospitalization, she was evaluated by vascular surgery and it was decided to treat medically, at that time, with close outpatient follow-up with Dr. Myra GianottiBrabham. Once stablized, she was discharged to a SNF for temporary rehab. She was seen by Dr. Myra GianottiBrabham for post hospital follow-up and endorsed persistent back pain, which was reportedly worse since the time she was discharged. A repeat CT scan was performed which did not show any significant changes. However, she was scheduled for expedited endovascular repair of her aneurysm. On 09/27/2014, she underwent endovascular repair of her aneurysm using a Gore CTAG device. She had a distal extension placed as well. This was done through left common femoral artery access. Following her procedure, she did have persistent pain. A CT scan was obtained which showed that the stent graft was in good position and that the aneurysm was completely excluded. She did have a new finding of a subclavian artery dissection as well as narrowing of her left distal external / common femoral artery, however she remained completely asymptomatic. She was ultimately discharged back to a nursing home.    She was seen, once again, by Dr. Myra GianottiBrabham for post surgical follow-up on 10/31/2014 and was felt to be stable from a cardiovascular standpoint. He recommended reevaluation  in 6 months with a repeat CT scan of the chest, abdomen and pelvis.  She presents to clinic today for follow-up, as it was [facility]. There was concern regaring orthostatic hypotension. Per records sent from SNF, she was noted to have orthostatic BP from 10/19/14-10/21/14. SBPs were recorded in the 70-90s. Vital signs were recorded daily and she had no recurrent issues beyond 10/21/14. From that time, SBPs remained stable in the 130s-150s. She denied any dizziness, syncope/ near syncope or falls [facility]. It should be noted that in the daily notes from the SNF, she was noted to have decreased appeitie and poor PO intake during that time, which may have been contributing to her othostasis.   Today, she states that she has been doing well. She denies any symptoms with positional changes. No recurrent back pain. No chest or abominal pain. No dyspnea, dizziness, syncope/ near syncope. She was [facility] yesterday and states that she did not take her BP meds today, because she was confused as to what medications to take. Her intial BP today in clinic was 173/84. Orthostatic vitals were check and she is not orthostatic. Vitals checked in various positions revealed appropriate response in systolic pressure, diastolic pressure and heart rate (see in extended vitals section). Her BP was rechecked prior to the end of appt and SBPwas 158.    Current Outpatient Prescriptions  Medication Sig Dispense Refill  . atorvastatin (LIPITOR) 20 MG tablet Take 1 tablet (20 mg total) by mouth daily at 6 PM. 30 tablet 11  . bisacodyl (DULCOLAX) 5 MG EC tablet Take 2 tablets (10 mg total) by mouth  daily as needed for mild constipation or moderate constipation.    . calcium carbonate (OS-CAL) 600 MG TABS tablet Take 600 mg by mouth 2 (two) times daily with a meal.    . docusate sodium 100 MG CAPS Take 100 mg by mouth daily.    . felodipine (PLENDIL) 10 MG 24 hr tablet  Take 10 mg by mouth daily.     Marland Kitchen gemfibrozil (LOPID) 600 MG tablet Take 600 mg by mouth 2 (two) times daily before a meal.     . HYDROcodone-acetaminophen (NORCO/VICODIN) 5-325 MG per tablet Take 1 tablet by mouth every 6 (six) hours as needed for moderate pain. 20 tablet 0  . labetalol (NORMODYNE) 200 MG tablet Take 2 tablets (400 mg total) by mouth 3 (three) times daily. 180 tablet 11  . metFORMIN (GLUCOPHAGE) 500 MG tablet Take 500 mg by mouth 2 (two) times daily with a meal.     . Polyethyl Glycol-Propyl Glycol (SYSTANE OP) Place 1 drop into both eyes 2 (two) times daily.    . Vitamin D, Ergocalciferol, (DRISDOL) 50000 UNITS CAPS capsule Take 50,000 Units by mouth every 7 (seven) days. Every saturday     No current facility-administered medications for this visit.    No Known Allergies  History   Social History  . Marital Status: Married    Spouse Name: N/A    Number of Children: N/A  . Years of Education: N/A   Occupational History  . Not on file.   Social History Main Topics  . Smoking status: Never Smoker   . Smokeless tobacco: Not on file  . Alcohol Use: No  . Drug Use: No  . Sexual Activity: Not on file   Other Topics Concern  . Not on file   Social History Narrative     Review of Systems: General: negative for chills, fever, night sweats or weight changes.  Cardiovascular: negative for chest pain, dyspnea on exertion, edema, orthopnea, palpitations, paroxysmal nocturnal dyspnea or shortness of breath Dermatological: negative for rash Respiratory: negative for cough or wheezing Urologic: negative for hematuria Abdominal: negative for nausea, vomiting, diarrhea, bright red blood per rectum, melena, or hematemesis Neurologic: negative for visual changes, syncope, or dizziness All other systems reviewed and are otherwise negative except as noted above.    Blood pressure 173/84, pulse 88, height 5\' 3"  (1.6 m), weight 132 lb 12.8 oz (60.238 kg).  General  appearance: alert, cooperative and no distress Neck: no carotid bruit and no JVD Lungs: clear to auscultation bilaterally Heart: regular rate and rhythm, S1, S2 normal, no murmur, click, rub or gallop Extremities: no LEE Pulses: 2+ and symmetric Skin: warm and dry Neurologic: Grossly normal  EKG Not performed  ASSESSMENT AND PLAN:   1. Descending thoracic aortic aneurysm: s/p repair by Dr. Myra Gianotti. Postoperative CT scan revealed the graft to be in good position with no endoleak. She denies any recurrent pain. Per Dr. Myra Gianotti, F/U CT in 6 months.  2. Orthostatic hypotension: Per daily notes sent from her skilled nursing facility, this occurred for limited period of time from November 18 through November 20. Vital signs were since that time on half remained stable. Decreased PO intake during that time may have been contributing. Her appetite has increased as well as her PO intake. She denies any recent symptoms with positional changes. Orthostatic vitals were check in clinic today and she is not orthostatic. Vitals checked in various positions revealed appropriate response in systolic pressure, diastolic pressure and heart rate (see  in extended vitals section).   3. Hypertension: Patient is hypertensive in clinic today but she admits that she did not take her medications this morning because she was confused by the discharge instructions she was given from the skilled nursing facility when she was discharged yesterday. We have reviewed her medications. Per discharge summary and office notes from Dr. Myra GianottiBrabham, she is to take 10 mg of felodipine daily and 400 mg of labetalol 3 times daily. She has been ordered to have home physical therapy. Her first session will be in 2 days. I have recommended that her therapists monitor her blood pressure closely, including orthostatics and have also recommended that she monitor her blood pressure closely at home. She is to report any issues with either hypotension  and/or hypertension.  PLAN  patient appears stable from a cardiovascular standpoint. We will make no changes in medical therapy at this time. She is to notify our office if she has any further issues with her blood pressure. If she remains stable, she has been instructed to follow-up with Dr.  Eldridge DaceVaranasi in 3 months.  Adynn Caseres, BRITTAINYPA-C 11/04/2014 2:46 PM

## 2014-11-04 NOTE — Patient Instructions (Signed)
Your physician recommends that you continue on your current medications as directed. Please refer to the Current Medication list given to you today.   Your physician recommends that you schedule a follow-up appointment in:  [facility]

## 2014-11-18 DIAGNOSIS — R2689 Other abnormalities of gait and mobility: Secondary | ICD-10-CM

## 2014-11-18 DIAGNOSIS — I5032 Chronic diastolic (congestive) heart failure: Secondary | ICD-10-CM

## 2014-11-18 DIAGNOSIS — M6281 Muscle weakness (generalized): Secondary | ICD-10-CM

## 2014-11-18 DIAGNOSIS — E119 Type 2 diabetes mellitus without complications: Secondary | ICD-10-CM

## 2014-11-18 DIAGNOSIS — N183 Chronic kidney disease, stage 3 (moderate): Secondary | ICD-10-CM

## 2014-11-18 DIAGNOSIS — I129 Hypertensive chronic kidney disease with stage 1 through stage 4 chronic kidney disease, or unspecified chronic kidney disease: Secondary | ICD-10-CM

## 2015-02-14 ENCOUNTER — Ambulatory Visit (INDEPENDENT_AMBULATORY_CARE_PROVIDER_SITE_OTHER): Payer: Medicare Other | Admitting: Interventional Cardiology

## 2015-02-14 ENCOUNTER — Encounter: Payer: Self-pay | Admitting: Interventional Cardiology

## 2015-02-14 VITALS — BP 136/58 | HR 60 | Ht 63.0 in | Wt 130.4 lb

## 2015-02-14 DIAGNOSIS — I150 Renovascular hypertension: Secondary | ICD-10-CM

## 2015-02-14 DIAGNOSIS — E785 Hyperlipidemia, unspecified: Secondary | ICD-10-CM

## 2015-02-14 DIAGNOSIS — I7101 Dissection of thoracic aorta: Secondary | ICD-10-CM

## 2015-02-14 DIAGNOSIS — I712 Thoracic aortic aneurysm, without rupture, unspecified: Secondary | ICD-10-CM

## 2015-02-14 NOTE — Progress Notes (Signed)
Patient ID: Jennifer [name], female   DOB: October 15, 1925, 81 y.o.   MRN: [id number]     Cardiology Office Note   Date:  02/14/2015   ID:  Jennifer [name], DOB [birth year], MRN [id number]  PCP:  Kristie Cowman, MD  Cardiologist:   Corky Crafts., MD   No chief complaint on file. h/o AAA    History of Present Illness: Jennifer [name] is a 81 y.o. female who presents for f/u of AAA.  SHe had an aneurysm repair in 2012.  She required stent placement to the aortia in 2015.  She has done well.  She still has occasional back pain.  She had a negative stress test in 10/15.  She had a complicated course with a rehab stay at [facility] place for weakness.  She has improved.  She has tried to eat more and gain a little weight since then.  Her stomach is more sensitive these.   BP readings at home are better controlled.  130-160s typically.  Average is better since her felodipine was increased. Overall, readings are better.       Past Medical History  Diagnosis Date  . Diabetes mellitus without complication   . Hypertension   . AAA (abdominal aortic aneurysm)-repaired   . Irregular heart beat   . Acute bronchitis   . Tachycardia     Unspecified    Past Surgical History  Procedure Laterality Date  . Abdominal hysterectomy    . Abdominal aortic aneurysm repair    . Breast lumpectomy Left     Benign Mass  . Thoracic aortic endovascular stent graft N/A 09/27/2014    Procedure: THORACIC AORTIC ENDOVASCULAR STENT GRAFT;  Surgeon: Nada Libman, MD;  Location: [facility] OR;  Service: Vascular;  Laterality: N/A;     Current Outpatient Prescriptions  Medication Sig Dispense Refill  . atorvastatin (LIPITOR) 20 MG tablet Take 1 tablet (20 mg total) by mouth daily at 6 PM. 30 tablet 11  . bisacodyl (DULCOLAX) 5 MG EC tablet Take 2 tablets (10 mg total) by mouth daily as needed for mild constipation or moderate constipation.    . calcium carbonate (OS-CAL) 600 MG TABS tablet Take 600 mg by mouth 2 (two)  times daily with a meal.    . docusate sodium 100 MG CAPS Take 100 mg by mouth daily.    . felodipine (PLENDIL) 10 MG 24 hr tablet Take 10 mg by mouth daily.     Marland Kitchen gemfibrozil (LOPID) 600 MG tablet Take 600 mg by mouth 2 (two) times daily before a meal.     . HYDROcodone-acetaminophen (NORCO/VICODIN) 5-325 MG per tablet Take 1 tablet by mouth every 6 (six) hours as needed for moderate pain. 20 tablet 0  . labetalol (NORMODYNE) 200 MG tablet Take 2 tablets (400 mg total) by mouth 3 (three) times daily. 180 tablet 11  . metFORMIN (GLUCOPHAGE) 500 MG tablet Take 500 mg by mouth 2 (two) times daily with a meal.     . Polyethyl Glycol-Propyl Glycol (SYSTANE OP) Place 1 drop into both eyes 2 (two) times daily.    . Vitamin D, Ergocalciferol, (DRISDOL) 50000 UNITS CAPS capsule Take 50,000 Units by mouth every 7 (seven) days. Every saturday     No current facility-administered medications for this visit.    Allergies:   Review of patient's allergies indicates no known allergies.    Social History:  The patient  reports that she has never smoked. She does not have any smokeless tobacco  history on file. She reports that she does not drink alcohol or use illicit drugs.   Family History:  The patient's family history includes Heart disease in her sister.    ROS:  Please see the history of present illness.   Otherwise, review of systems are positive for weakness-improving.   All other systems are reviewed and negative.    PHYSICAL EXAM: VS:  BP 136/58 mmHg  Pulse 60  Ht 5\' 3"  (1.6 m)  Wt 130 lb 6.4 oz (59.149 kg)  BMI 23.11 kg/m2  SpO2 98% , BMI Body mass index is 23.11 kg/(m^2). GEN: Well nourished, well developed, in no acute distress HEENT: normal Neck: no JVD, carotid bruits, or masses Cardiac: RRR; no murmurs, rubs, or gallops,no edema  Respiratory:  clear to auscultation bilaterally, normal work of breathing GI: soft, nontender, nondistended, + BS MS: no deformity or atrophy Skin: warm  and dry, no rash Neuro:  Strength and sensation are intact Psych: euthymic mood, full affect   EKG:  EKG is not ordered today.    Recent Labs: 08/28/2014: TSH 1.280 09/27/2014: ALT 8; Magnesium 1.6 10/01/2014: BUN 25*; Creatinine 0.90; Hemoglobin 8.3*; Platelets 180; Potassium 3.9; Sodium 141    Lipid Panel    Component Value Date/Time   CHOL 199 08/29/2014 0025   TRIG 336* 08/29/2014 0025   HDL 33* 08/29/2014 0025   CHOLHDL 6.0 08/29/2014 0025   VLDL 67* 08/29/2014 0025   LDLCALC 99 08/29/2014 0025      Wt Readings from Last 3 Encounters:  02/14/15 130 lb 6.4 oz (59.149 kg)  11/04/14 132 lb 12.8 oz (60.238 kg)  10/31/14 133 lb (60.328 kg)      Other studies Reviewed: Additional studies/ records that were reviewed today include: records from Dr. Myra GianottiBrabham Review of the above records demonstrates: endovascular stent placement   ASSESSMENT AND PLAN:  1.  AAA: s/p repair 2. HTN: Controlled. Continue current meds. 3. Hyperlipidemia: LDL target < 100.  She will need lipids checked.   Current medicines are reviewed at length with the patient today.  The patient does not have concerns regarding medicines.  The following changes have been made:  no change  Labs/ tests ordered today include: will check CBC., CMet, lipids   Encourage more exercise. She should try to get at least 150 minutes of walking per week. She will have to build up to that level.  Disposition:   FU with me in 1 year   Delorise JacksonSigned, Heaven Meeker S., MD  02/14/2015 11:27 AM    [facility] [address], [place], [place]  [address] Phone: [phone]; Fax: [phone]

## 2015-02-14 NOTE — Patient Instructions (Signed)
Your physician recommends that you continue on your current medications as directed. Please refer to the Current Medication list given to you today.  Your physician recommends that you return for lab work when fasting (CMET, CBC, Lipids)  Your physician wants you to follow-up in: 1 year with Dr. Eldridge DaceVaranasi.  You will receive a reminder letter in the mail two months in advance. If you don't receive a letter, please call our office to schedule the follow-up appointment.

## 2015-02-20 ENCOUNTER — Other Ambulatory Visit (INDEPENDENT_AMBULATORY_CARE_PROVIDER_SITE_OTHER): Payer: Medicare Other | Admitting: *Deleted

## 2015-02-20 DIAGNOSIS — E785 Hyperlipidemia, unspecified: Secondary | ICD-10-CM

## 2015-02-20 DIAGNOSIS — I150 Renovascular hypertension: Secondary | ICD-10-CM

## 2015-02-20 LAB — LIPID PANEL
CHOLESTEROL: 138 mg/dL (ref 0–200)
HDL: 43.6 mg/dL (ref >39.00)
LDL CALC: 71 mg/dL (ref 0–99)
NonHDL: 94.4
Total CHOL/HDL Ratio: 3
Triglycerides: 117 mg/dL (ref 0.0–149.0)
VLDL: 23.4 mg/dL (ref 0.0–40.0)

## 2015-02-20 LAB — COMPREHENSIVE METABOLIC PANEL
ALK PHOS: 54 U/L (ref 39–117)
ALT: 8 U/L (ref 0–35)
AST: 13 U/L (ref 0–37)
Albumin: 3.7 g/dL (ref 3.5–5.2)
BUN: 18 mg/dL (ref 6–23)
CO2: 25 mEq/L (ref 19–32)
Calcium: 10 mg/dL (ref 8.4–10.5)
Chloride: 104 mEq/L (ref 96–112)
Creatinine, Ser: 1.07 mg/dL (ref 0.40–1.20)
GFR: 51.5 mL/min — ABNORMAL LOW (ref >60.00)
Glucose, Bld: 109 mg/dL — ABNORMAL HIGH (ref 70–99)
Potassium: 3.7 mEq/L (ref 3.5–5.1)
SODIUM: 139 meq/L (ref 135–145)
TOTAL PROTEIN: 7.2 g/dL (ref 6.0–8.3)
Total Bilirubin: 0.4 mg/dL (ref 0.2–1.2)

## 2015-02-20 LAB — CBC
HEMATOCRIT: 28.7 % — AB (ref 36.0–46.0)
Hemoglobin: 9.5 g/dL — ABNORMAL LOW (ref 12.0–15.0)
MCHC: 33.2 g/dL (ref 30.0–36.0)
MCV: 86.5 fl (ref 78.0–100.0)
PLATELETS: 263 10*3/uL (ref 150.0–400.0)
RBC: 3.32 Mil/uL — AB (ref 3.87–5.11)
RDW: 16 % — ABNORMAL HIGH (ref 11.5–15.5)
WBC: 11.9 10*3/uL — AB (ref 4.0–10.5)

## 2015-02-20 NOTE — Addendum Note (Signed)
Addended by: Tonita PhoenixBOWDEN, ROBIN K on: 02/20/2015 09:33 AM   Modules accepted: Orders

## 2015-02-28 ENCOUNTER — Encounter (HOSPITAL_COMMUNITY): Payer: Self-pay | Admitting: Emergency Medicine

## 2015-02-28 ENCOUNTER — Emergency Department (HOSPITAL_COMMUNITY): Payer: Medicare Other

## 2015-02-28 ENCOUNTER — Inpatient Hospital Stay (HOSPITAL_COMMUNITY): Payer: Medicare Other | Admitting: Certified Registered"

## 2015-02-28 ENCOUNTER — Encounter (HOSPITAL_COMMUNITY)
Admission: EM | Disposition: A | Discharge status: Home / Self Care | Payer: Self-pay | Source: Home / Self Care | Attending: Internal Medicine

## 2015-02-28 ENCOUNTER — Inpatient Hospital Stay (HOSPITAL_COMMUNITY)
Admission: EM | Admit: 2015-02-28 | Discharge: 2015-03-04 | DRG: 580 | Disposition: A | Discharge status: Home / Self Care | Payer: Medicare Other | Attending: Internal Medicine | Admitting: Internal Medicine

## 2015-02-28 DIAGNOSIS — B9689 Other specified bacterial agents as the cause of diseases classified elsewhere: Secondary | ICD-10-CM | POA: Diagnosis not present

## 2015-02-28 DIAGNOSIS — T8149XA Infection following a procedure, other surgical site, initial encounter: Secondary | ICD-10-CM | POA: Diagnosis present

## 2015-02-28 DIAGNOSIS — Z8679 Personal history of other diseases of the circulatory system: Secondary | ICD-10-CM

## 2015-02-28 DIAGNOSIS — I712 Thoracic aortic aneurysm, without rupture, unspecified: Secondary | ICD-10-CM | POA: Diagnosis present

## 2015-02-28 DIAGNOSIS — L02211 Cutaneous abscess of abdominal wall: Principal | ICD-10-CM | POA: Diagnosis present

## 2015-02-28 DIAGNOSIS — D509 Iron deficiency anemia, unspecified: Secondary | ICD-10-CM | POA: Diagnosis not present

## 2015-02-28 DIAGNOSIS — E1122 Type 2 diabetes mellitus with diabetic chronic kidney disease: Secondary | ICD-10-CM

## 2015-02-28 DIAGNOSIS — L03311 Cellulitis of abdominal wall: Secondary | ICD-10-CM | POA: Diagnosis not present

## 2015-02-28 DIAGNOSIS — N3090 Cystitis, unspecified without hematuria: Secondary | ICD-10-CM | POA: Diagnosis not present

## 2015-02-28 DIAGNOSIS — Z90710 Acquired absence of both cervix and uterus: Secondary | ICD-10-CM | POA: Diagnosis not present

## 2015-02-28 DIAGNOSIS — I10 Essential (primary) hypertension: Secondary | ICD-10-CM | POA: Diagnosis present

## 2015-02-28 DIAGNOSIS — E119 Type 2 diabetes mellitus without complications: Secondary | ICD-10-CM | POA: Diagnosis present

## 2015-02-28 DIAGNOSIS — N189 Chronic kidney disease, unspecified: Secondary | ICD-10-CM

## 2015-02-28 DIAGNOSIS — N390 Urinary tract infection, site not specified: Secondary | ICD-10-CM | POA: Diagnosis present

## 2015-02-28 DIAGNOSIS — I2510 Atherosclerotic heart disease of native coronary artery without angina pectoris: Secondary | ICD-10-CM | POA: Diagnosis present

## 2015-02-28 DIAGNOSIS — E785 Hyperlipidemia, unspecified: Secondary | ICD-10-CM | POA: Diagnosis present

## 2015-02-28 DIAGNOSIS — B961 Klebsiella pneumoniae [K. pneumoniae] as the cause of diseases classified elsewhere: Secondary | ICD-10-CM | POA: Diagnosis present

## 2015-02-28 DIAGNOSIS — IMO0002 Reserved for concepts with insufficient information to code with codable children: Secondary | ICD-10-CM | POA: Diagnosis present

## 2015-02-28 DIAGNOSIS — K651 Peritoneal abscess: Secondary | ICD-10-CM | POA: Diagnosis not present

## 2015-02-28 DIAGNOSIS — R109 Unspecified abdominal pain: Secondary | ICD-10-CM | POA: Diagnosis present

## 2015-02-28 DIAGNOSIS — Z905 Acquired absence of kidney: Secondary | ICD-10-CM | POA: Diagnosis present

## 2015-02-28 HISTORY — PX: INCISION AND DRAINAGE ABSCESS: SHX5864

## 2015-02-28 LAB — CBC WITH DIFFERENTIAL/PLATELET
Basophils Absolute: 0 10*3/uL (ref 0.0–0.1)
Basophils Relative: 0 % (ref 0–1)
Eosinophils Absolute: 0.1 10*3/uL (ref 0.0–0.7)
Eosinophils Relative: 1 % (ref 0–5)
HCT: 25.6 % — ABNORMAL LOW (ref 36.0–46.0)
Hemoglobin: 8.1 g/dL — ABNORMAL LOW (ref 12.0–15.0)
Lymphocytes Relative: 12 % (ref 12–46)
Lymphs Abs: 1.9 10*3/uL (ref 0.7–4.0)
MCH: 28.4 pg (ref 26.0–34.0)
MCHC: 31.6 g/dL (ref 30.0–36.0)
MCV: 89.8 fL (ref 78.0–100.0)
Monocytes Absolute: 1.6 10*3/uL — ABNORMAL HIGH (ref 0.1–1.0)
Monocytes Relative: 10 % (ref 3–12)
Neutro Abs: 12.6 10*3/uL — ABNORMAL HIGH (ref 1.7–7.7)
Neutrophils Relative %: 77 % (ref 43–77)
Platelets: 351 10*3/uL (ref 150–400)
RBC: 2.85 MIL/uL — ABNORMAL LOW (ref 3.87–5.11)
RDW: 15.4 % (ref 11.5–15.5)
WBC: 16.1 10*3/uL — ABNORMAL HIGH (ref 4.0–10.5)

## 2015-02-28 LAB — BASIC METABOLIC PANEL
Anion gap: 9 (ref 5–15)
BUN: 18 mg/dL (ref 6–23)
CO2: 24 mmol/L (ref 19–32)
Calcium: 9.6 mg/dL (ref 8.4–10.5)
Chloride: 106 mmol/L (ref 96–112)
Creatinine, Ser: 1.04 mg/dL (ref 0.50–1.10)
GFR calc Af Amer: 54 mL/min — ABNORMAL LOW (ref >90)
GFR calc non Af Amer: 47 mL/min — ABNORMAL LOW (ref >90)
Glucose, Bld: 113 mg/dL — ABNORMAL HIGH (ref 70–99)
Potassium: 3.5 mmol/L (ref 3.5–5.1)
Sodium: 139 mmol/L (ref 135–145)

## 2015-02-28 LAB — GLUCOSE, CAPILLARY
Glucose-Capillary: 93 mg/dL (ref 70–99)
Glucose-Capillary: 90 mg/dL (ref 70–99)

## 2015-02-28 LAB — I-STAT CG4 LACTIC ACID, ED: Lactic Acid, Venous: 1.88 mmol/L (ref 0.5–2.0)

## 2015-02-28 SURGERY — INCISION AND DRAINAGE, ABSCESS
Anesthesia: General | Site: Abdomen

## 2015-02-28 MED ORDER — OXYCODONE HCL 5 MG/5ML PO SOLN: 5.0000 mg | Freq: Once | ORAL | Status: DC | PRN

## 2015-02-28 MED ORDER — INSULIN ASPART 100 UNIT/ML ~~LOC~~ SOLN: 0.0000 [IU] | SUBCUTANEOUS | Status: DC

## 2015-02-28 MED ORDER — SUCCINYLCHOLINE CHLORIDE 20 MG/ML IJ SOLN
INTRAMUSCULAR | Status: AC
  Filled 2015-02-28: qty 1

## 2015-02-28 MED ORDER — OXYCODONE HCL 5 MG PO TABS: 5.0000 mg | ORAL_TABLET | Freq: Once | ORAL | Status: DC | PRN

## 2015-02-28 MED ORDER — ROCURONIUM BROMIDE 100 MG/10ML IV SOLN
INTRAVENOUS | Status: DC | PRN
  Administered 2015-02-28: 30 mg via INTRAVENOUS

## 2015-02-28 MED ORDER — PIPERACILLIN-TAZOBACTAM 3.375 G IVPB
3.3750 g | Freq: Three times a day (TID) | INTRAVENOUS | Status: DC
  Administered 2015-03-01 – 2015-03-03 (×9): 3.375 g via INTRAVENOUS
  Filled 2015-02-28 (×13): qty 50

## 2015-02-28 MED ORDER — HYDROCODONE-ACETAMINOPHEN 5-325 MG PO TABS: 1.0000 | ORAL_TABLET | ORAL | Status: DC | PRN

## 2015-02-28 MED ORDER — POTASSIUM CHLORIDE IN NACL 20-0.9 MEQ/L-% IV SOLN
INTRAVENOUS | Status: DC
  Administered 2015-02-28 – 2015-03-03 (×6): via INTRAVENOUS
  Filled 2015-02-28 (×8): qty 1000

## 2015-02-28 MED ORDER — FENTANYL CITRATE 0.05 MG/ML IJ SOLN
INTRAMUSCULAR | Status: DC | PRN
  Administered 2015-02-28: 100 ug via INTRAVENOUS

## 2015-02-28 MED ORDER — LACTATED RINGERS IV SOLN: INTRAVENOUS | Status: DC | PRN

## 2015-02-28 MED ORDER — MORPHINE SULFATE 2 MG/ML IJ SOLN: 1.0000 mg | INTRAMUSCULAR | Status: DC | PRN

## 2015-02-28 MED ORDER — DOCUSATE SODIUM 100 MG PO CAPS
100.0000 mg | ORAL_CAPSULE | Freq: Every day | ORAL | Status: DC
  Administered 2015-02-28 – 2015-03-02 (×3): 100 mg via ORAL
  Filled 2015-02-28 (×5): qty 1

## 2015-02-28 MED ORDER — ONDANSETRON HCL 4 MG/2ML IJ SOLN: 4.0000 mg | Freq: Four times a day (QID) | INTRAMUSCULAR | Status: DC | PRN

## 2015-02-28 MED ORDER — LABETALOL HCL 100 MG PO TABS
400.0000 mg | ORAL_TABLET | Freq: Three times a day (TID) | ORAL | Status: DC
  Administered 2015-03-01 – 2015-03-04 (×8): 400 mg via ORAL
  Filled 2015-02-28 (×10): qty 4

## 2015-02-28 MED ORDER — HYDROMORPHONE HCL 1 MG/ML IJ SOLN: 0.2500 mg | INTRAMUSCULAR | Status: DC | PRN

## 2015-02-28 MED ORDER — GLYCOPYRROLATE 0.2 MG/ML IJ SOLN
INTRAMUSCULAR | Status: DC | PRN
  Administered 2015-02-28: 0.4 mg via INTRAVENOUS

## 2015-02-28 MED ORDER — FENTANYL CITRATE 0.05 MG/ML IJ SOLN
INTRAMUSCULAR | Status: AC
  Filled 2015-02-28: qty 5

## 2015-02-28 MED ORDER — MORPHINE SULFATE 2 MG/ML IJ SOLN
2.0000 mg | INTRAMUSCULAR | Status: DC | PRN
  Administered 2015-03-01 (×2): 2 mg via INTRAVENOUS
  Filled 2015-02-28 (×2): qty 1

## 2015-02-28 MED ORDER — BISACODYL 5 MG PO TBEC: 10.0000 mg | DELAYED_RELEASE_TABLET | Freq: Every day | ORAL | Status: DC | PRN

## 2015-02-28 MED ORDER — ONDANSETRON HCL 4 MG/2ML IJ SOLN: 4.0000 mg | Freq: Once | INTRAMUSCULAR | Status: DC | PRN

## 2015-02-28 MED ORDER — PROPOFOL 10 MG/ML IV BOLUS
INTRAVENOUS | Status: AC
  Filled 2015-02-28: qty 20

## 2015-02-28 MED ORDER — LACTATED RINGERS IV SOLN
Freq: Once | INTRAVENOUS | Status: AC
  Administered 2015-02-28: 16:00:00 via INTRAVENOUS

## 2015-02-28 MED ORDER — CALCIUM CARBONATE 1250 (500 CA) MG PO TABS
1250.0000 mg | ORAL_TABLET | Freq: Two times a day (BID) | ORAL | Status: DC
  Administered 2015-03-01 – 2015-03-04 (×7): 1250 mg via ORAL
  Filled 2015-02-28: qty 1
  Filled 2015-02-28: qty 3
  Filled 2015-02-28: qty 1
  Filled 2015-02-28 (×6): qty 3

## 2015-02-28 MED ORDER — ONDANSETRON HCL 4 MG/2ML IJ SOLN
INTRAMUSCULAR | Status: AC
  Filled 2015-02-28: qty 4

## 2015-02-28 MED ORDER — ACETAMINOPHEN 650 MG RE SUPP: 650.0000 mg | Freq: Four times a day (QID) | RECTAL | Status: DC | PRN

## 2015-02-28 MED ORDER — ROCURONIUM BROMIDE 50 MG/5ML IV SOLN
INTRAVENOUS | Status: AC
  Filled 2015-02-28: qty 1

## 2015-02-28 MED ORDER — PROPOFOL 10 MG/ML IV BOLUS
INTRAVENOUS | Status: DC | PRN
  Administered 2015-02-28: 50 mg via INTRAVENOUS

## 2015-02-28 MED ORDER — GEMFIBROZIL 600 MG PO TABS
600.0000 mg | ORAL_TABLET | Freq: Two times a day (BID) | ORAL | Status: DC
  Administered 2015-03-01 – 2015-03-03 (×6): 600 mg via ORAL
  Filled 2015-02-28 (×8): qty 1

## 2015-02-28 MED ORDER — ONDANSETRON HCL 4 MG PO TABS: 4.0000 mg | ORAL_TABLET | Freq: Four times a day (QID) | ORAL | Status: DC | PRN

## 2015-02-28 MED ORDER — POTASSIUM CHLORIDE IN NACL 20-0.9 MEQ/L-% IV SOLN: INTRAVENOUS | Status: DC

## 2015-02-28 MED ORDER — LIDOCAINE HCL (CARDIAC) 20 MG/ML IV SOLN
INTRAVENOUS | Status: DC | PRN
  Administered 2015-02-28: 60 mg via INTRAVENOUS

## 2015-02-28 MED ORDER — VANCOMYCIN HCL IN DEXTROSE 750-5 MG/150ML-% IV SOLN
750.0000 mg | INTRAVENOUS | Status: DC
  Administered 2015-03-01 – 2015-03-02 (×2): 750 mg via INTRAVENOUS
  Filled 2015-02-28 (×3): qty 150

## 2015-02-28 MED ORDER — MORPHINE SULFATE 4 MG/ML IJ SOLN
4.0000 mg | Freq: Once | INTRAMUSCULAR | Status: AC
  Administered 2015-02-28: 4 mg via INTRAVENOUS
  Filled 2015-02-28: qty 1

## 2015-02-28 MED ORDER — GLYCOPYRROLATE 0.2 MG/ML IJ SOLN
INTRAMUSCULAR | Status: AC
  Filled 2015-02-28: qty 2

## 2015-02-28 MED ORDER — HYDROMORPHONE HCL 1 MG/ML IJ SOLN: 0.5000 mg | INTRAMUSCULAR | Status: DC | PRN

## 2015-02-28 MED ORDER — LACTATED RINGERS IV SOLN
INTRAVENOUS | Status: DC | PRN
  Administered 2015-02-28: 17:00:00 via INTRAVENOUS

## 2015-02-28 MED ORDER — NEOSTIGMINE METHYLSULFATE 10 MG/10ML IV SOLN
INTRAVENOUS | Status: AC
  Filled 2015-02-28: qty 1

## 2015-02-28 MED ORDER — IOHEXOL 300 MG/ML  SOLN: 25.0000 mL | Freq: Once | INTRAMUSCULAR | Status: DC | PRN

## 2015-02-28 MED ORDER — ACETAMINOPHEN 325 MG PO TABS: 650.0000 mg | ORAL_TABLET | Freq: Four times a day (QID) | ORAL | Status: DC | PRN

## 2015-02-28 MED ORDER — ONDANSETRON HCL 4 MG/2ML IJ SOLN
INTRAMUSCULAR | Status: DC | PRN
  Administered 2015-02-28: 4 mg via INTRAVENOUS

## 2015-02-28 MED ORDER — VANCOMYCIN HCL 10 G IV SOLR
1250.0000 mg | Freq: Once | INTRAVENOUS | Status: AC
  Administered 2015-02-28: 1250 mg via INTRAVENOUS
  Filled 2015-02-28: qty 1250

## 2015-02-28 MED ORDER — VITAMIN D (ERGOCALCIFEROL) 1.25 MG (50000 UNIT) PO CAPS: 50000.0000 [IU] | ORAL_CAPSULE | ORAL | Status: DC

## 2015-02-28 MED ORDER — PIPERACILLIN-TAZOBACTAM 3.375 G IVPB 30 MIN
3.3750 g | Freq: Once | INTRAVENOUS | Status: AC
  Administered 2015-02-28: 3.375 g via INTRAVENOUS
  Filled 2015-02-28: qty 50

## 2015-02-28 MED ORDER — NEOSTIGMINE METHYLSULFATE 10 MG/10ML IV SOLN
INTRAVENOUS | Status: DC | PRN
Start: 2015-02-28 — End: 2015-02-28
  Administered 2015-02-28: 3 mg via INTRAVENOUS

## 2015-02-28 MED ORDER — ATORVASTATIN CALCIUM 20 MG PO TABS
20.0000 mg | ORAL_TABLET | Freq: Every day | ORAL | Status: DC
  Administered 2015-03-01 – 2015-03-03 (×3): 20 mg via ORAL
  Filled 2015-02-28 (×3): qty 1

## 2015-02-28 MED ORDER — POLYETHYL GLYCOL-PROPYL GLYCOL 0.4-0.3 % OP SOLN: 1.0000 [drp] | Freq: Two times a day (BID) | OPHTHALMIC | Status: DC

## 2015-02-28 MED ORDER — ENOXAPARIN SODIUM 30 MG/0.3ML ~~LOC~~ SOLN
30.0000 mg | SUBCUTANEOUS | Status: DC
  Administered 2015-03-01 – 2015-03-03 (×3): 30 mg via SUBCUTANEOUS
  Filled 2015-02-28 (×2): qty 0.3

## 2015-02-28 MED ORDER — FELODIPINE ER 10 MG PO TB24
10.0000 mg | ORAL_TABLET | Freq: Every day | ORAL | Status: DC
  Administered 2015-03-01 – 2015-03-04 (×4): 10 mg via ORAL
  Filled 2015-02-28 (×5): qty 1

## 2015-02-28 MED ORDER — HYDROCODONE-ACETAMINOPHEN 5-325 MG PO TABS
1.0000 | ORAL_TABLET | ORAL | Status: DC | PRN
  Administered 2015-02-28 – 2015-03-01 (×2): 1 via ORAL
  Administered 2015-03-01: 2 via ORAL
  Administered 2015-03-02: 1 via ORAL
  Administered 2015-03-02: 2 via ORAL
  Administered 2015-03-03: 1 via ORAL
  Filled 2015-02-28 (×2): qty 2
  Filled 2015-02-28 (×2): qty 1
  Filled 2015-02-28: qty 2
  Filled 2015-02-28: qty 1

## 2015-02-28 MED ORDER — SODIUM CHLORIDE 0.9 % IV SOLN: INTRAVENOUS | Status: DC

## 2015-02-28 SURGICAL SUPPLY — 29 items
BLADE SURG ROTATE 9660 (MISCELLANEOUS) ×2 IMPLANT
BNDG GAUZE ELAST 4 BULKY (GAUZE/BANDAGES/DRESSINGS) ×2 IMPLANT
CANISTER SUCTION 2500CC (MISCELLANEOUS) ×1 IMPLANT
CHLORAPREP W/TINT 26ML (MISCELLANEOUS) ×2 IMPLANT
COVER SURGICAL LIGHT HANDLE (MISCELLANEOUS) ×3 IMPLANT
DRAPE LAPAROSCOPIC ABDOMINAL (DRAPES) ×3 IMPLANT
DRAPE UTILITY XL STRL (DRAPES) ×2 IMPLANT
ELECT CAUTERY BLADE 6.4 (BLADE) ×3 IMPLANT
ELECT REM PT RETURN 9FT ADLT (ELECTROSURGICAL) ×3
ELECTRODE REM PT RTRN 9FT ADLT (ELECTROSURGICAL) ×1 IMPLANT
GAUZE SPONGE 4X4 12PLY STRL (GAUZE/BANDAGES/DRESSINGS) ×3 IMPLANT
GLOVE EUDERMIC 7 POWDERFREE (GLOVE) ×3 IMPLANT
GOWN STRL REUS W/ TWL LRG LVL3 (GOWN DISPOSABLE) ×1 IMPLANT
GOWN STRL REUS W/ TWL XL LVL3 (GOWN DISPOSABLE) ×1 IMPLANT
GOWN STRL REUS W/TWL LRG LVL3 (GOWN DISPOSABLE) ×3
GOWN STRL REUS W/TWL XL LVL3 (GOWN DISPOSABLE) ×3
KIT BASIN OR (CUSTOM PROCEDURE TRAY) ×3 IMPLANT
KIT ROOM TURNOVER OR (KITS) ×3 IMPLANT
NS IRRIG 1000ML POUR BTL (IV SOLUTION) ×3 IMPLANT
PACK GENERAL/GYN (CUSTOM PROCEDURE TRAY) ×3 IMPLANT
PAD ABD 8X10 STRL (GAUZE/BANDAGES/DRESSINGS) ×2 IMPLANT
PAD ARMBOARD 7.5X6 YLW CONV (MISCELLANEOUS) ×6 IMPLANT
SPONGE GAUZE 4X4 12PLY STER LF (GAUZE/BANDAGES/DRESSINGS) ×2 IMPLANT
SWAB COLLECTION DEVICE MRSA (MISCELLANEOUS) ×3 IMPLANT
TAPE CLOTH SURG 6X10 WHT LF (GAUZE/BANDAGES/DRESSINGS) ×2 IMPLANT
TOWEL OR 17X24 6PK STRL BLUE (TOWEL DISPOSABLE) ×3 IMPLANT
TOWEL OR 17X26 10 PK STRL BLUE (TOWEL DISPOSABLE) ×3 IMPLANT
TUBE ANAEROBIC SPECIMEN COL (MISCELLANEOUS) ×3 IMPLANT
WATER STERILE IRR 1000ML POUR (IV SOLUTION) ×1 IMPLANT

## 2015-02-28 NOTE — Transfer of Care (Signed)
Immediate Anesthesia Transfer of Care Note  Patient: Jennifer MusselJanie B Nordgren  Procedure(s) Performed: Procedure(s): INCISION AND DRAINAGE ABSCESS ABDOMINAL WALL (N/A)  Patient Location: PACU  Anesthesia Type:General  Level of Consciousness: awake and oriented  Airway & Oxygen Therapy: Patient Spontanous Breathing and Patient connected to nasal cannula oxygen  Post-op Assessment: Report given to RN, Post -op Vital signs reviewed and stable and Patient moving all extremities  Post vital signs: Reviewed and stable  Last Vitals:  Filed Vitals:   02/28/15 1530  BP: 131/58  Pulse: 77  Temp:   Resp: 15    Complications: No apparent anesthesia complications

## 2015-02-28 NOTE — ED Notes (Signed)
Admitting at bedside 

## 2015-02-28 NOTE — H&P (Addendum)
[facility] History and Physical  Jennifer [name] ZOX:[id number] DOB: 1925-03-12 DOA: 02/28/2015  Referring physician: Dr. Jerelyn Scott, EDP PCP: Kristie Cowman, MD  Specialists: Dr. Jim Like   Chief Complaint: Abdominal pain  HPI: Jennifer [name] is a 81 y.o. female  With a history of coronary artery disease, diabetes mellitus, hypertension, history of AAA with repair, that presented to the emergency department with complaints of abdominal pain.  Patient states that she recently went to see her primary care doctor approximately one week ago at which point she was having abdominal pain and was found to have a urinary tract infection. She then presented again here and hold to come to the emergency department. Her redness and pain had increased and worsened. Patient states her pain is 10 out of 10, is unable to have any relief. At the time of admission, patient denied chest pain shortness of breath, nausea, vomiting. She denied any recent travel or ill contacts. In the emergency department CT of the abdomen and pelvis was conducted and did have what appears to be a lobulated hyperdense area. General surgery was called and will see the patient in consult. TR[facility] asked to admit.   Review of Systems:  Constitutional: Denies fever, chills, diaphoresis, appetite change and fatigue.  HEENT: Denies photophobia, eye pain, redness, hearing loss, ear pain, congestion, sore throat, rhinorrhea, sneezing, mouth sores, trouble swallowing, neck pain, neck stiffness and tinnitus.   Respiratory: Denies SOB, DOE, cough, chest tightness,  and wheezing.   Cardiovascular: Denies chest pain, palpitations and leg swelling.  Gastrointestinal: Abdominal pain.  Genitourinary: Denies dysuria, urgency, frequency, hematuria, flank pain and difficulty urinating.  Musculoskeletal: Denies myalgias, back pain, joint swelling, arthralgias and gait problem.  Skin: Denies pallor, rash and wound.  Neurological: Denies dizziness,  seizures, syncope, weakness, light-headedness, numbness and headaches.  Hematological: Denies adenopathy. Easy bruising, personal or family bleeding history  Psychiatric/Behavioral: Denies suicidal ideation, mood changes, confusion, nervousness, sleep disturbance and agitation  Past Medical History  Diagnosis Date  . Diabetes mellitus without complication   . Hypertension   . AAA (abdominal aortic aneurysm)-repaired   . Irregular heart beat   . Acute bronchitis   . Tachycardia     Unspecified   Past Surgical History  Procedure Laterality Date  . Abdominal hysterectomy    . Abdominal aortic aneurysm repair    . Breast lumpectomy Left     Benign Mass  . Thoracic aortic endovascular stent graft N/A 09/27/2014    Procedure: THORACIC AORTIC ENDOVASCULAR STENT GRAFT;  Surgeon: Nada Libman, MD;  Location: [facility] OR;  Service: Vascular;  Laterality: N/A;   Social History:  reports that she has never smoked. She does not have any smokeless tobacco history on file. She reports that she does not drink alcohol or use illicit drugs.   No Known Allergies  Family History  Problem Relation Age of Onset  . Heart disease Sister     Prior to Admission medications   Medication Sig Start Date End Date Taking? Authorizing Provider  calcium carbonate (OS-CAL) 600 MG TABS tablet Take 600 mg by mouth 2 (two) times daily with a meal.   Yes Historical Provider, MD  cephALEXin (KEFLEX) 250 MG capsule Take 250 mg by mouth daily.   Yes Historical Provider, MD  felodipine (PLENDIL) 10 MG 24 hr tablet Take 10 mg by mouth daily.  08/07/14  Yes Historical Provider, MD  gemfibrozil (LOPID) 600 MG tablet Take 600 mg by mouth 2 (two) times daily before  a meal.  07/28/14  Yes Historical Provider, MD  hydrocodone-acetaminophen (LORCET-HD) 5-500 MG per capsule Take 1 capsule by mouth every 6 (six) hours as needed for pain.   Yes Historical Provider, MD  metFORMIN (GLUCOPHAGE) 500 MG tablet Take 500 mg by mouth 2 (two)  times daily with a meal.  07/28/14  Yes Historical Provider, MD  Vitamin D, Ergocalciferol, (DRISDOL) 50000 UNITS CAPS capsule Take 50,000 Units by mouth every 7 (seven) days. Every saturday   Yes Historical Provider, MD  atorvastatin (LIPITOR) 20 MG tablet Take 1 tablet (20 mg total) by mouth daily at 6 PM. 09/12/14   Rhonda G Barrett, PA-C  bisacodyl (DULCOLAX) 5 MG EC tablet Take 2 tablets (10 mg total) by mouth daily as needed for mild constipation or moderate constipation. 09/12/14   Rhonda G Barrett, PA-C  docusate sodium 100 MG CAPS Take 100 mg by mouth daily. 09/12/14   Rhonda G Barrett, PA-C  HYDROcodone-acetaminophen (NORCO/VICODIN) 5-325 MG per tablet Take 1 tablet by mouth every 6 (six) hours as needed for moderate pain. 10/04/14   Raymond Gurney, PA-C  labetalol (NORMODYNE) 200 MG tablet Take 2 tablets (400 mg total) by mouth 3 (three) times daily. 09/12/14   Rhonda G Barrett, PA-C  Polyethyl Glycol-Propyl Glycol (SYSTANE OP) Place 1 drop into both eyes 2 (two) times daily.    Historical Provider, MD   Physical Exam: Filed Vitals:   02/28/15 1349  BP: 120/63  Pulse: 79  Temp:   Resp: 20     General: Well developed, well nourished, NAD, appears stated age  HEENT: NCAT, PERRLA, EOMI, Anicteic Sclera, mucous membranes moist.   Neck: Supple, no JVD, no masses  Cardiovascular: S1 S2 auscultated, no rubs, murmurs or gallops. Regular rate and rhythm.  Respiratory: Clear to auscultation bilaterally with equal chest rise  Abdomen: Soft, Lower abd tenderness to palpation, nondistended, + bowel sounds, area of erythema and firmness suprapubic  Extremities: warm dry without cyanosis clubbing or edema  Neuro: AAOx3, cranial nerves grossly intact. Strength equal and bilat eral upper and lower extremities   Skin: Without rashes exudates or nodules, seborrheic keratosis  Psych: Normal affect and demeanor with intact judgement and insight  Labs on Admission:  Basic Metabolic  Panel:  Recent Labs Lab 02/28/15 1240  NA 139  K 3.5  CL 106  CO2 24  GLUCOSE 113*  BUN 18  CREATININE 1.04  CALCIUM 9.6   Liver Function Tests: No results for input(s): AST, ALT, ALKPHOS, BILITOT, PROT, ALBUMIN in the last 168 hours. No results for input(s): LIPASE, AMYLASE in the last 168 hours. No results for input(s): AMMONIA in the last 168 hours. CBC:  Recent Labs Lab 02/28/15 1240  WBC 16.1*  NEUTROABS 12.6*  HGB 8.1*  HCT 25.6*  MCV 89.8  PLT 351   Cardiac Enzymes: No results for input(s): CKTOTAL, CKMB, CKMBINDEX, TROPONINI in the last 168 hours.  BNP (last 3 results) No results for input(s): BNP in the last 8760 hours.  ProBNP (last 3 results) No results for input(s): PROBNP in the last 8760 hours.  CBG: No results for input(s): GLUCAP in the last 168 hours.  Radiological Exams on Admission: Ct Abdomen Pelvis W Contrast  02/28/2015   CLINICAL DATA:  81 year old female with abdominal pain for 1 week. Cellulitis. Initial encounter.  EXAM: CT ABDOMEN AND PELVIS WITH CONTRAST  TECHNIQUE: Multidetector CT imaging of the abdomen and pelvis was performed using the standard protocol following bolus administration of intravenous contrast.  CONTRAST:  80 mL Omnipaque 300.  COMPARISON:  Chest abdomen and pelvis CTA 09/29/2014.  FINDINGS: Since 2015, distal thoracic aorta endograft re- identified. It appears the native thoracic aortic aneurysm sac has regressed since that time. Largely resolved bibasilar atelectasis and pleural effusions. Stable cardiomegaly. No pericardial effusion.  Scoliosis and degenerative changes in the spine. No acute osseous abnormality identified.  No pelvic free fluid.  Increased stool in the rectum.  New since the prior study is an abnormal appearance of the inferior rectus abdominal muscles, more so the right. There is also abnormal density in the overlying suprapubic region abdominal wall subcutaneous fat. Lobulated low to intermediate density  masslike enlargement of the soft tissues here with peripheral and internal septal type hyper enhancement. The abnormal mass bulges into the ventral peritoneal cavity, and there is inflammation in the space of Retzius, also with 1 or 2 finger type projections directed toward the dome of the bladder (see sagittal image 54). The rectus abnormality encompasses 42 x 81 x 85 mm (AP by transverse by CC). The overlying abnormal subcutaneous fat then encompasses another 68 cm diameter.  There is mild thickening of the anterior dome of the bladder nearest the abnormality. There is a trace amount of gas within the bladder (series 2, image 71). There is a left lateral bladder diverticulum versus simple fluid density left adnexal cyst adjacent to the bladder measuring 2.5 cm on series 2, image 68.  Uterus and adnexa otherwise appear surgically absent. Diverticulosis of the sigmoid colon which does not appear involved in the above abnormality. Diverticulosis continues to the splenic flexure with no associated active inflammation. There is diverticulosis at the hepatic flexure and involving the right colon. Appendix not identified. Negative terminal ileum. No dilated or inflamed small bowel loops are identified. Oral contrast has not yet reached the distal small bowel. Negative stomach and duodenum.  No abdominal free fluid or free air. Stable and negative liver, gallbladder (decompressed), spleen, pancreas, and adrenal glands. Both kidneys appear stable with symmetric renal enhancement and contrast excretion.  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 No lymphadenopathy.  IMPRESSION: 1. Severely abnormal lower rectus abdominal muscles right greater than left with lobulated intermediate density and hyperenhancement encompassing 4.2 x 8.1 x 8.5 cm. This appearance is new since October and is favored to reflect superinfected spontaneous  rectus muscle hematomas. Intramuscular abscess suspected. Confluent overlying subcutaneous fat inflammation/edema. 2. Secondary inflammation of the space of Retzius and anterior bladder wall. Trace gas within the bladder might be infectious in nature. 3. Improved appearance of the distal thoracic aortic aneurysm status post endograft placement. Improved bibasilar ventilation.   Electronically Signed   By: Odessa FlemingH  Hall M.D.   On: 02/28/2015 14:16    EKG: None  Assessment/Plan  Celluitis/Abscess of abdominal wall -Patient will be admitted to medical floor. -Patient had worsening of abdominal cellulitis with what appears to be affect -CT abdomen/pelvis: Severely abnormal lower rectus abdominal muscles, right greater than left, lobulated intermediate density with hyperenhancement 4.2 x 8.1 x 8.5 cm -Gen. surgery consulted and appreciated -Will make patient nothing by mouth -Will place patient on vancomycin and Zosyn -Given patient age, history of coronary artery disease/AAA, patient is at moderate risk -Echocardiogram in October 2015 shows an EF 65% -Patient recently fell her cardiologist, Dr. Jim LikeVarnasi on 02/14/2015, was found to be stable at that time, no changes were made to her medications, follow up in one  year   Chronic normocytic anemia -Baseline hemoglobin appears to be approximately the 9 -Will obtain anemia panel and monitor CBC -Transfuse if less than 7  History of coronary artery disease -Patient currently chest pain-free, will continue statin, labetalol  Diabetes mellitus, type II -Metformin currently held, will place patient on insulin sliding scale CBG monitoring (q4hrs as patient currently NPO) -Last HbA1c in 09/2014: 6.4  Hyperlipidemia -Continue statin and Lopid  History of AAA status post repair -Stable  Hypertension -Continue plendil, labetolol  DVT prophylaxis: SCDs  Code Status: Full  Condition: Guarded  Family Communication: Husband at bedside.  Admission,  patients condition and plan of care including tests being ordered have been discussed with the patient and husband who indicate understanding and agree with the plan and Code Status.  Disposition Plan: Admitted   Time spent: 60 minutes  Thurman Sarver D.O. [facility] Pager [phone]  If 7PM-7AM, please contact night-coverage [email or web address] Password [facility] 02/28/2015, 3:39 PM

## 2015-02-28 NOTE — Progress Notes (Signed)
Results of abdominal abcess culture paged to K. Craige CottaKirby.

## 2015-02-28 NOTE — Anesthesia Procedure Notes (Signed)
Procedure Name: Intubation Date/Time: 02/28/2015 4:43 PM Performed by: Charm BargesBUTLER, Jennifer Burdine R Pre-anesthesia Checklist: Patient identified, Emergency Drugs available, Suction available, Patient being monitored and Timeout performed Patient Re-evaluated:Patient Re-evaluated prior to inductionOxygen Delivery Method: Circle system utilized Preoxygenation: Pre-oxygenation with 100% oxygen Intubation Type: IV induction Ventilation: Mask ventilation without difficulty Laryngoscope Size: Mac and 3 Grade View: Grade III Tube type: Oral Tube size: 7.5 mm Number of attempts: 1 Airway Equipment and Method: Stylet Placement Confirmation: ETT inserted through vocal cords under direct vision and breath sounds checked- equal and bilateral Secured at: 21 cm Tube secured with: Tape Dental Injury: Teeth and Oropharynx as per pre-operative assessment

## 2015-02-28 NOTE — ED Notes (Signed)
General surgery at bedside. 

## 2015-02-28 NOTE — ED Provider Notes (Signed)
CSN: [id number]     Arrival date & time 02/28/15  1121 History   First MD Initiated Contact with Patient 02/28/15 1157     Chief Complaint  Patient presents with  . Cellulitis     (Consider location/radiation/quality/duration/timing/severity/associated sxs/prior Treatment) HPI Patient presents to the emergency department with tenderness and swelling on the lower part of her abdomen.  The patient states she was seen by her primary care doctor.  They felt she may have a urinary tract infection.  She followed up today with family Center in for further evaluation due to the fact she had swelling and redness of the abdominal wall.  Patient states that movement and palpation make the pain worse.  The patient denies fever, nausea, vomiting, weakness, dizziness, headache, blurred vision, back pain, dysuria, or syncope.  The patient states that she did not take any medications prior to arrival Past Medical History  Diagnosis Date  . Diabetes mellitus without complication   . Hypertension   . AAA (abdominal aortic aneurysm)-repaired   . Irregular heart beat   . Acute bronchitis   . Tachycardia     Unspecified   Past Surgical History  Procedure Laterality Date  . Abdominal hysterectomy    . Abdominal aortic aneurysm repair    . Breast lumpectomy Left     Benign Mass  . Thoracic aortic endovascular stent graft N/A 09/27/2014    Procedure: THORACIC AORTIC ENDOVASCULAR STENT GRAFT;  Surgeon: Nada LibmanVance W Brabham, MD;  [facility];  Service: Vascular;  Laterality: N/A;   Family History  Problem Relation Age of Onset  . Heart disease Sister    History  Substance Use Topics  . Smoking status: Never Smoker   . Smokeless tobacco: Not on file  . Alcohol Use: No   OB History    No data available     Review of Systems  All other systems negative except as documented in the HPI. All pertinent positives and negatives as reviewed in the HPI.  Allergies  Review of patient's allergies indicates  no known allergies.  Home Medications   Prior to Admission medications   Medication Sig Start Date End Date Taking? Authorizing Provider  atorvastatin (LIPITOR) 20 MG tablet Take 1 tablet (20 mg total) by mouth daily at 6 PM. 09/12/14   Rhonda G Barrett, PA-C  bisacodyl (DULCOLAX) 5 MG EC tablet Take 2 tablets (10 mg total) by mouth daily as needed for mild constipation or moderate constipation. 09/12/14   Rhonda G Barrett, PA-C  calcium carbonate (OS-CAL) 600 MG TABS tablet Take 600 mg by mouth 2 (two) times daily with a meal.    Historical Provider, MD  docusate sodium 100 MG CAPS Take 100 mg by mouth daily. 09/12/14   Rhonda G Barrett, PA-C  felodipine (PLENDIL) 10 MG 24 hr tablet Take 10 mg by mouth daily.  08/07/14   Historical Provider, MD  gemfibrozil (LOPID) 600 MG tablet Take 600 mg by mouth 2 (two) times daily before a meal.  07/28/14   Historical Provider, MD  HYDROcodone-acetaminophen (NORCO/VICODIN) 5-325 MG per tablet Take 1 tablet by mouth every 6 (six) hours as needed for moderate pain. 10/04/14   Raymond GurneyKimberly A Trinh, PA-C  labetalol (NORMODYNE) 200 MG tablet Take 2 tablets (400 mg total) by mouth 3 (three) times daily. 09/12/14   Rhonda G Barrett, PA-C  metFORMIN (GLUCOPHAGE) 500 MG tablet Take 500 mg by mouth 2 (two) times daily with a meal.  07/28/14   Historical Provider, MD  Polyethyl Glycol-Propyl Glycol (SYSTANE OP) Place 1 drop into both eyes 2 (two) times daily.    Historical Provider, MD  Vitamin D, Ergocalciferol, (DRISDOL) 50000 UNITS CAPS capsule Take 50,000 Units by mouth every 7 (seven) days. Every saturday    Historical Provider, MD   BP 120/63 mmHg  Pulse 79  Temp(Src) 98.4 F (36.9 C)  Resp 20  SpO2 96% Physical Exam  Constitutional: She is oriented to person, place, and time. She appears well-developed and well-nourished. No distress.  HENT:  Head: Normocephalic and atraumatic.  Mouth/Throat: Oropharynx is clear and moist.  Eyes: Pupils are equal, round, and  reactive to light.  Neck: Normal range of motion. Neck supple.  Cardiovascular: Normal rate, regular rhythm and normal heart sounds.  Exam reveals no gallop and no friction rub.   No murmur heard. Pulmonary/Chest: Effort normal and breath sounds normal. No respiratory distress. She has no wheezes.  Abdominal: Bowel sounds are normal. There is tenderness.    Neurological: She is alert and oriented to person, place, and time. She exhibits normal muscle tone. Coordination normal.  Skin: Skin is warm and dry. No rash noted. No erythema.  Nursing note and vitals reviewed.   ED Course  Procedures (including critical care time) Labs Review Labs Reviewed  BASIC METABOLIC PANEL - Abnormal; Notable for the following:    Glucose, Bld 113 (*)    GFR calc non Af Amer 47 (*)    GFR calc Af Amer 54 (*)    All other components within normal limits  CBC WITH DIFFERENTIAL/PLATELET - Abnormal; Notable for the following:    WBC 16.1 (*)    RBC 2.85 (*)    Hemoglobin 8.1 (*)    HCT 25.6 (*)    Neutro Abs 12.6 (*)    Monocytes Absolute 1.6 (*)    All other components within normal limits  URINE CULTURE  URINALYSIS, ROUTINE W REFLEX MICROSCOPIC  I-STAT CG4 LACTIC ACID, ED    Imaging Review Ct Abdomen Pelvis W Contrast  02/28/2015   CLINICAL DATA:  81 year old female with abdominal pain for 1 week. Cellulitis. Initial encounter.  EXAM: CT ABDOMEN AND PELVIS WITH CONTRAST  TECHNIQUE: Multidetector CT imaging of the abdomen and pelvis was performed using the standard protocol following bolus administration of intravenous contrast.  CONTRAST:  80 mL Omnipaque 300.  COMPARISON:  Chest abdomen and pelvis CTA 09/29/2014.  FINDINGS: Since 2015, distal thoracic aorta endograft re- identified. It appears the native thoracic aortic aneurysm sac has regressed since that time. Largely resolved bibasilar atelectasis and pleural effusions. Stable cardiomegaly. No pericardial effusion.  Scoliosis and degenerative  changes in the spine. No acute osseous abnormality identified.  No pelvic free fluid.  Increased stool in the rectum.  New since the prior study is an abnormal appearance of the inferior rectus abdominal muscles, more so the right. There is also abnormal density in the overlying suprapubic region abdominal wall subcutaneous fat. Lobulated low to intermediate density masslike enlargement of the soft tissues here with peripheral and internal septal type hyper enhancement. The abnormal mass bulges into the ventral peritoneal cavity, and there is inflammation in the space of Retzius, also with 1 or 2 finger type projections directed toward the dome of the bladder (see sagittal image 54). The rectus abnormality encompasses 42 x 81 x 85 mm (AP by transverse by CC). The overlying abnormal subcutaneous fat then encompasses another 68 cm diameter.  There is mild thickening of the anterior dome of the bladder nearest  the abnormality. There is a trace amount of gas within the bladder (series 2, image 71). There is a left lateral bladder diverticulum versus simple fluid density left adnexal cyst adjacent to the bladder measuring 2.5 cm on series 2, image 68.  Uterus and adnexa otherwise appear surgically absent. Diverticulosis of the sigmoid colon which does not appear involved in the above abnormality. Diverticulosis continues to the splenic flexure with no associated active inflammation. There is diverticulosis at the hepatic flexure and involving the right colon. Appendix not identified. Negative terminal ileum. No dilated or inflamed small bowel loops are identified. Oral contrast has not yet reached the distal small bowel. Negative stomach and duodenum.  No abdominal free fluid or free air. Stable and negative liver, gallbladder (decompressed), spleen, pancreas, and adrenal glands. Both kidneys appear stable with symmetric renal enhancement and contrast excretion.  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 No lymphadenopathy.  IMPRESSION: 1. Severely abnormal lower rectus abdominal muscles right greater than left with lobulated intermediate density and hyperenhancement encompassing 4.2 x 8.1 x 8.5 cm. This appearance is new since October and is favored to reflect superinfected spontaneous rectus muscle hematomas. Intramuscular abscess suspected. Confluent overlying subcutaneous fat inflammation/edema. 2. Secondary inflammation of the space of Retzius and anterior bladder wall. Trace gas within the bladder might be infectious in nature. 3. Improved appearance of the distal thoracic aortic aneurysm status post endograft placement. Improved bibasilar ventilation.   Electronically Signed   By: Odessa Fleming M.D.   On: 02/28/2015 14:16     I spoke with general surgery about the patient and the [facility] they will be down to evaluate her, I started vancomycin and Zosyn.  Patient was explained the results of her testing and all questions were answered   Charlestine Night, PA-C 02/28/15 1555  Jerelyn Scott, MD 02/28/15 [phone]

## 2015-02-28 NOTE — ED Notes (Signed)
Pt returned from CT °

## 2015-02-28 NOTE — Progress Notes (Signed)
Gen. surgery:  History: Very pleasant but acutely ill [age] year old Caucasian female. She presents with a 1 day history of swelling and redness and pain related to her midline incision. She says she's had some pain longer, but it only recently became red and swollen. Denies trauma. She does not take any anticoagulants. Past history significant for open abdominal aortic aneurysm repair. In October, 2015 she underwent endovascular repair of descending thoracic aortic aneurysm by Dr. Lavinia SharpsBartel and Dr. Anner CreteWells. Apparently did well with that. In the remote past she underwent a nephrectomy, located by small bowel obstruction 1 week later requiring reexploration.  Exam reveals a pleasant woman in some distress. Her husband is with her. Abdominal exam reveals that her abdomen is generally soft and not distended. She has a long midline incision. There is a bilobed abdominal wall mass with erythema and tenderness. This feels somewhat fluctuant. CT scan shows an 8.5 x 4.2 cm lower right rectus muscle fluid collection consistent with abscess. This was new since last October. Secondarily infected spontaneous hematoma is suggested. There is a secondary infiltrate inflammation of the space of right serous an anterior bladder wall. There was one dot of air near the bladder wall thought to possibly be infectious. Descending thoracic aortic aneurysm showed improvement in appearance.  Hemoglobin 8.1. WBC 16,100. Potassium 3.5. Creatinine 1.04. Glucose 113.  Assessment: Abdominal wall abscess, theoretically due to spontaneous rectus sheath hematoma with secondary infection Remote history nephrectomy Remote history laparotomy for small bowel obstruction, one week following nephrectomy remote history of open abdominal aortic aneurysm repair Diabetes mellitus without complication Hypertension History stage III renal disease  Plan: Patient will be taken to the operating room for exploration of her abdominal wall abscess IV  Zosyn and vancomycin have been ordered Foley catheter and urinalysis I discussed the indications, details, techniques, and numerous risk of the surgery with the patient and her husband. She is aware of the risk of bleeding, recurrent infection, injury to adjacent organs such as the bladder or intestine with major reconstructive surgery, possibility that infection may have, from deep within the abdomen, but felt to be unlikely. Cardiac, pulmonary, and thromboembolic problems. She understands all these issues. At this time all of her questions are answered. She fully agrees with this plan.   Angelia MouldHaywood M. Derrell LollingIngram, M.D., [facility]ACS [facility] General and Minimally invasive Surgery Breast and Colorectal Surgery Office:   [phone] Pager:   [phone]

## 2015-02-28 NOTE — Op Note (Signed)
Patient Name:           Jennifer [name]   Date of Surgery:        02/28/2015  Pre op Gershon MusselDiagnosis:      Abdominal wall abscess  Post op Diagnosis:    same  Procedure:                 Incision and drainage of complex, multi-chambered abdominal wall abscess, debridement of necrotic fascia   Surgeon:                     Angelia MouldHaywood M. Derrell LollingIngram, M.D., FACS  Assistant:                      OR staff   Operative Indications:   This is an 81 year old female who presents with a several day history of abdominal pain. On exam she was found to have cellulitis of her abdominal wall and a bilobed fluctuant, tender, erythematous mass in the midline below the umbilicus. CT scan showed this to be a multichambered fluid collection in the subcutaneous tissue and below the midline fascia.  There was no evidence of acute intra-abdominal disease, although the dome of the bladder looked slightly inflamed. Past history of abdominal aortic aneurysm repair, open technique. More recent history 6 months ago of endovascular stent placemen of descending thoracic aortic aneurysm.  Operative Findings:       I found a multilobed abdominal wall abscess which contained purulent fluid that was tannish brown in color and very foul-smelling, suggesting Escherichia coli. There were a couple of braided sutures in the wound. There was an abscess to the right of the midline in the deep subcutaneous tissue, abscess to left the midline in the subcutaneous tissue, and abscess deep to the fascia which we opened up. Cultures were taken. There was no evidence of any enteric drainage or communication with the GI tract or the bladder. It is quite possible that the most posterior extent of the abscess was the pre-peritoneal space or the posterior rectus sheath.  Procedure in Detail:          Following the induction of general endotracheal anesthesia the patient's abdomen was prepped and draped in a sterile fashion. A Foley catheter had been placed immediately  prior to the procedure and urinalysis and urine culture sent. Surgical timeout was performed. Incision was made in the midline and I entered an abscess cavity in the subcutaneous tissue. Cultures were taken. I opened the incision up widely above and below through the full extent of the abscess cavity. There is still drainage coming from a deeper spot and I opened up the fascia a little bit entered another abscess cavity was probably about 4 cm in diameter. All this fluid was evacuated and irrigated. Some of the necrotic fascia was debrided. The entire and abscess cavity was packed with saline moistened Kerlix. Cover bandages were placed. Hemostasis excellent. EBL 25 mL. Counts correct. Complications none.     Angelia MouldHaywood M. Derrell LollingIngram, M.D., FACS General and Minimally Invasive Surgery Breast and Colorectal Surgery  02/28/2015 5:20 PM

## 2015-02-28 NOTE — Anesthesia Preprocedure Evaluation (Signed)
Anesthesia Evaluation  Patient identified by MRN, date of birth, ID band Patient awake    Reviewed: Allergy & Precautions, NPO status , Patient's Chart, lab work & pertinent test results  Airway Mallampati: II  TM Distance: >3 FB Neck ROM: Full  Mouth opening: Limited Mouth Opening  Dental  (+) Teeth Intact, Dental Advisory Given   Pulmonary  breath sounds clear to auscultation        Cardiovascular hypertension, Pt. on medications and Pt. on home beta blockers Rhythm:Regular Rate:Normal     Neuro/Psych    GI/Hepatic   Endo/Other  diabetes, Well Controlled, Type 2  Renal/GU      Musculoskeletal   Abdominal   Peds  Hematology   Anesthesia Other Findings   Reproductive/Obstetrics                             Anesthesia Physical Anesthesia Plan  ASA: III and emergent  Anesthesia Plan: General   Post-op Pain Management:    Induction: Intravenous, Rapid sequence and Cricoid pressure planned  Airway Management Planned: Oral ETT  Additional Equipment:   Intra-op Plan:   Post-operative Plan: Extubation in OR  Informed Consent: I have reviewed the patients History and Physical, chart, labs and discussed the procedure including the risks, benefits and alternatives for the proposed anesthesia with the patient or authorized representative who has indicated his/her understanding and acceptance.   Dental advisory given  Plan Discussed with: CRNA, Anesthesiologist and Surgeon  Anesthesia Plan Comments:         Anesthesia Quick Evaluation

## 2015-02-28 NOTE — Progress Notes (Signed)
ANTIBIOTIC CONSULT NOTE - INITIAL  Pharmacy Consult for Vancomycin, Zosyn  Indication: Cellulitis   No Known Allergies  Patient Measurements:   Actual Body Weight: 59.1kg   Vital Signs: Temp: 98.4 F (36.9 C) (03/29 1136) BP: 120/63 mmHg (03/29 1349) Pulse Rate: 79 (03/29 1349) Intake/Output from previous day:   Intake/Output from this shift:    Labs:  Recent Labs  02/28/15 1240  WBC 16.1*  HGB 8.1*  PLT 351  CREATININE 1.04   CrCl cannot be calculated (Unknown ideal weight.). No results for input(s): VANCOTROUGH, VANCOPEAK, VANCORANDOM, GENTTROUGH, GENTPEAK, GENTRANDOM, TOBRATROUGH, TOBRAPEAK, TOBRARND, AMIKACINPEAK, AMIKACINTROU, AMIKACIN in the last 72 hours.   Microbiology: No results found for this or any previous visit (from the past 720 hour(s)).  Medical History: Past Medical History  Diagnosis Date  . Diabetes mellitus without complication   . Hypertension   . AAA (abdominal aortic aneurysm)-repaired   . Irregular heart beat   . Acute bronchitis   . Tachycardia     Unspecified    Medications:   (Not in a hospital admission) Assessment: [age] YOF with abdominal wall cellulitis. Abdominal CT scan shows an lower right rectus muscle fluid collection consistent with abscess Pharmacy consulted to start empiric Vancomycin and Zosyn. WBC elevated at 16.1. Afebrile. CrCl ~ 30-35 mL/min  3/29 Urine Cx >>   Goal of Therapy:  Vancomycin trough level 15-20 mcg/ml  Plan:  -Give Vancomycin 1250 mg IV load followed by Vancomycin 750 mg IV Q 24 hours -Start Zosyn 3.375 gm IV Q 8 hours -Monitor CBC, renal fx, cultures and clinical progress [facility]  Vinnie LevelBenjamin Isair Inabinet, PharmD., BCPS Clinical Pharmacist Pager [phone]

## 2015-02-28 NOTE — Consult Note (Signed)
[facility] Surgery Consult Note  Jennifer [name] [birth year]  [id number].    Requesting MD: Dr. Canary Brim Chief Complaint/Reason for Consult: Abdominal wall abscess  HPI:  81 y/o white female with PMH CAD, diabetes mellitus, hypertension, history of AAA with repair who presents to [facility] with abdominal pain, redness, and swelling for 2.5 weeks.  She went to her PCP 1 week ago for the pain and they found her to have a UTI.  She has Keflex prescribed however.  Pain is 10/10 and radiates across her abdomen.  Pt c/o night sweats, chills, fatigue, weakness and diarrhea worse over the last few days.  Denies any CP/SOB, fever, dysuria, N/V.  She's been having flatus and BM's regularly.  She fell a few weeks ago after slipping on her bathroom rug and fell on her left side.  She didn't mention the pain in her abdomen because "I'm always complaining about something hurting".  The pain has not subsided, and was intermittent, but now constant and severe.  No recent travel or sick contacts.  No h/o hernia.  Has had AAA repair and reportedly b/l oophorectomy and ?hysterectomy in chart.  Patient recently saw her cardiologist, Dr. Christ Kick on 02/14/2015, was found to be stable at that time, no changes were made to her medications, follow up in one year.  Echocardiogram in October 2015 shows an EF 65%  In the ED a CT scan shows rectus/abdominal wall abscess.  WBC is 16.1, hgb is 8.1.  [facility] hospitalist have admitted and we will consult.    ROS: All systems reviewed and otherwise negative except for as above  Family History  Problem Relation Age of Onset  . Heart disease Sister     Past Medical History  Diagnosis Date  . Diabetes mellitus without complication   . Hypertension   . AAA (abdominal aortic aneurysm)-repaired   . Irregular heart beat   . Acute bronchitis   . Tachycardia     Unspecified    Past Surgical History  Procedure Laterality Date  . Abdominal hysterectomy    . Abdominal aortic  aneurysm repair    . Breast lumpectomy Left     Benign Mass  . Thoracic aortic endovascular stent graft N/A 09/27/2014    Procedure: THORACIC AORTIC ENDOVASCULAR STENT GRAFT;  Surgeon: Serafina Mitchell, MD;  Location: [facility] OR;  Service: Vascular;  Laterality: N/A;    Social History:  reports that she has never smoked. She does not have any smokeless tobacco history on file. She reports that she does not drink alcohol or use illicit drugs.  Allergies: No Known Allergies   (Not in a hospital admission)  Blood pressure 120/63, pulse 79, temperature 98.4 F (36.9 C), resp. rate 20, weight 59.1 kg (130 lb 4.7 oz), SpO2 96 %. Physical Exam: General: pleasant, WD/WN white female who is laying in bed in NAD, but moderate pain HEENT: head is normocephalic, atraumatic.  Sclera are noninjected.  PERRL.  Ears and nose without any masses or lesions.  Mouth is pink and moist Heart: regular, rate, and rhythm.  No obvious murmurs, gallops, or rubs noted.  Palpable pedal pulses bilaterally Lungs: CTAB, no wheezes, rhonchi, or rales noted.  Respiratory effort nonlabored Abd: soft, mildly distended, exquisitely tender in the lower abdomen over the area of erythema, induration, and fluctuance, +BS, large midline incision which is well healed except for tiny area of dark skin in the center.  Erythema spreads out laterally from midline incision. MS: all 4 extremities are symmetrical  with no cyanosis, clubbing, or edema. Skin: warm and dry with no masses, lesions, or rashes Psych: A&Ox3 with an appropriate affect.   Results for orders placed or performed during the hospital encounter of 02/28/15 (from the past 48 hour(s))  Basic metabolic panel     Status: Abnormal   Collection Time: 02/28/15 12:40 PM  Result Value Ref Range   Sodium 139 135 - 145 mmol/L   Potassium 3.5 3.5 - 5.1 mmol/L   Chloride 106 96 - 112 mmol/L   CO2 24 19 - 32 mmol/L   Glucose, Bld 113 (H) 70 - 99 mg/dL   BUN 18 6 - 23 mg/dL    Creatinine, Ser 1.04 0.50 - 1.10 mg/dL   Calcium 9.6 8.4 - 10.5 mg/dL   GFR calc non Af Amer 47 (L) >90 mL/min   GFR calc Af Amer 54 (L) >90 mL/min    Comment: (NOTE) The eGFR has been calculated using the CKD EPI equation. This calculation has not been validated in all clinical situations. eGFR's persistently <90 mL/min signify possible Chronic Kidney Disease.    Anion gap 9 5 - 15  CBC with Differential     Status: Abnormal   Collection Time: 02/28/15 12:40 PM  Result Value Ref Range   WBC 16.1 (H) 4.0 - 10.5 K/uL   RBC 2.85 (L) 3.87 - 5.11 MIL/uL   Hemoglobin 8.1 (L) 12.0 - 15.0 g/dL   HCT 25.6 (L) 36.0 - 46.0 %   MCV 89.8 78.0 - 100.0 fL   MCH 28.4 26.0 - 34.0 pg   MCHC 31.6 30.0 - 36.0 g/dL   RDW 15.4 11.5 - 15.5 %   Platelets 351 150 - 400 K/uL   Neutrophils Relative % 77 43 - 77 %   Neutro Abs 12.6 (H) 1.7 - 7.7 K/uL   Lymphocytes Relative 12 12 - 46 %   Lymphs Abs 1.9 0.7 - 4.0 K/uL   Monocytes Relative 10 3 - 12 %   Monocytes Absolute 1.6 (H) 0.1 - 1.0 K/uL   Eosinophils Relative 1 0 - 5 %   Eosinophils Absolute 0.1 0.0 - 0.7 K/uL   Basophils Relative 0 0 - 1 %   Basophils Absolute 0.0 0.0 - 0.1 K/uL  I-Stat CG4 Lactic Acid, ED     Status: None   Collection Time: 02/28/15 12:50 PM  Result Value Ref Range   Lactic Acid, Venous 1.88 0.5 - 2.0 mmol/L   Ct Abdomen Pelvis W Contrast  02/28/2015   CLINICAL DATA:  81 year old female with abdominal pain for 1 week. Cellulitis. Initial encounter.  EXAM: CT ABDOMEN AND PELVIS WITH CONTRAST  TECHNIQUE: Multidetector CT imaging of the abdomen and pelvis was performed using the standard protocol following bolus administration of intravenous contrast.  CONTRAST:  80 mL Omnipaque 300.  COMPARISON:  Chest abdomen and pelvis CTA 09/29/2014.  FINDINGS: Since 2015, distal thoracic aorta endograft re- identified. It appears the native thoracic aortic aneurysm sac has regressed since that time. Largely resolved bibasilar atelectasis and  pleural effusions. Stable cardiomegaly. No pericardial effusion.  Scoliosis and degenerative changes in the spine. No acute osseous abnormality identified.  No pelvic free fluid.  Increased stool in the rectum.  New since the prior study is an abnormal appearance of the inferior rectus abdominal muscles, more so the right. There is also abnormal density in the overlying suprapubic region abdominal wall subcutaneous fat. Lobulated low to intermediate density masslike enlargement of the soft tissues here with peripheral and  internal septal type hyper enhancement. The abnormal mass bulges into the ventral peritoneal cavity, and there is inflammation in the space of Retzius, also with 1 or 2 finger type projections directed toward the dome of the bladder (see sagittal image 54). The rectus abnormality encompasses 42 x 81 x 85 mm (AP by transverse by CC). The overlying abnormal subcutaneous fat then encompasses another 68 cm diameter.  There is mild thickening of the anterior dome of the bladder nearest the abnormality. There is a trace amount of gas within the bladder (series 2, image 71). There is a left lateral bladder diverticulum versus simple fluid density left adnexal cyst adjacent to the bladder measuring 2.5 cm on series 2, image 68.  Uterus and adnexa otherwise appear surgically absent. Diverticulosis of the sigmoid colon which does not appear involved in the above abnormality. Diverticulosis continues to the splenic flexure with no associated active inflammation. There is diverticulosis at the hepatic flexure and involving the right colon. Appendix not identified. Negative terminal ileum. No dilated or inflamed small bowel loops are identified. Oral contrast has not yet reached the distal small bowel. Negative stomach and duodenum.  No abdominal free fluid or free air. Stable and negative liver, gallbladder (decompressed), spleen, pancreas, and adrenal glands. Both kidneys appear stable with symmetric renal  enhancement and contrast excretion.  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 No lymphadenopathy.  IMPRESSION: 1. Severely abnormal lower rectus abdominal muscles right greater than left with lobulated intermediate density and hyperenhancement encompassing 4.2 x 8.1 x 8.5 cm. This appearance is new since October and is favored to reflect superinfected spontaneous rectus muscle hematomas. Intramuscular abscess suspected. Confluent overlying subcutaneous fat inflammation/edema. 2. Secondary inflammation of the space of Retzius and anterior bladder wall. Trace gas within the bladder might be infectious in nature. 3. Improved appearance of the distal thoracic aortic aneurysm status post endograft placement. Improved bibasilar ventilation.   Electronically Signed   By: Genevie Ann M.D.   On: 02/28/2015 14:16      Assessment/Plan Rectus/abdominal wall abscess and cellulitis 4.2 x 8.1 x 8.5 cm Leukocytosis -Admit to [facility], we will consult -NPO, IVF, pain control, antiemetics, antibiotics (vanc and zosyn Day #1) -Urgently to OR for I&D of abdominal wall/rectus abscess with Dr. Dalbert Batman  Chronic normocytic anemia H/o CAD DM Type II AAA s/p repair HTN  Coralie Keens, [facility] Surgery 02/28/2015, 3:43 PM Pager: [phone]

## 2015-02-28 NOTE — ED Notes (Signed)
Pt states she went to see her doctor and her doctor called and said she had a "bladder wall infection". Pt has large area on abdomen that's red and hot to the touch. Pt states its burning. Denies urinary symptoms.

## 2015-03-01 ENCOUNTER — Encounter (HOSPITAL_COMMUNITY): Payer: Self-pay | Admitting: General Surgery

## 2015-03-01 DIAGNOSIS — L03311 Cellulitis of abdominal wall: Secondary | ICD-10-CM

## 2015-03-01 LAB — CBC
HEMATOCRIT: 23.9 % — AB (ref 36.0–46.0)
HEMOGLOBIN: 7.5 g/dL — AB (ref 12.0–15.0)
MCH: 28.3 pg (ref 26.0–34.0)
MCHC: 31.4 g/dL (ref 30.0–36.0)
MCV: 90.2 fL (ref 78.0–100.0)
Platelets: 320 10*3/uL (ref 150–400)
RBC: 2.65 MIL/uL — ABNORMAL LOW (ref 3.87–5.11)
RDW: 15.8 % — ABNORMAL HIGH (ref 11.5–15.5)
WBC: 11.8 10*3/uL — ABNORMAL HIGH (ref 4.0–10.5)

## 2015-03-01 LAB — GLUCOSE, CAPILLARY
GLUCOSE-CAPILLARY: 105 mg/dL — AB (ref 70–99)
GLUCOSE-CAPILLARY: 109 mg/dL — AB (ref 70–99)
Glucose-Capillary: 80 mg/dL (ref 70–99)
Glucose-Capillary: 93 mg/dL (ref 70–99)
Glucose-Capillary: 87 mg/dL (ref 70–99)
Glucose-Capillary: 85 mg/dL (ref 70–99)
Glucose-Capillary: 122 mg/dL — ABNORMAL HIGH (ref 70–99)

## 2015-03-01 LAB — RETICULOCYTES
RBC.: 2.65 MIL/uL — ABNORMAL LOW (ref 3.87–5.11)
Retic Count, Absolute: 23.9 10*3/uL (ref 19.0–186.0)
Retic Ct Pct: 0.9 % (ref 0.4–3.1)

## 2015-03-01 LAB — VITAMIN B12: Vitamin B-12: 299 pg/mL (ref 211–911)

## 2015-03-01 LAB — BASIC METABOLIC PANEL
Anion gap: 6 (ref 5–15)
BUN: 12 mg/dL (ref 6–23)
CO2: 26 mmol/L (ref 19–32)
Calcium: 9.3 mg/dL (ref 8.4–10.5)
Chloride: 108 mmol/L (ref 96–112)
Creatinine, Ser: 1.03 mg/dL (ref 0.50–1.10)
GFR, EST AFRICAN AMERICAN: 55 mL/min — AB (ref >90)
GFR, EST NON AFRICAN AMERICAN: 47 mL/min — AB (ref >90)
GLUCOSE: 88 mg/dL (ref 70–99)
Potassium: 3.8 mmol/L (ref 3.5–5.1)
Sodium: 140 mmol/L (ref 135–145)

## 2015-03-01 LAB — IRON AND TIBC
Iron: 16 ug/dL — ABNORMAL LOW (ref 42–145)
Saturation Ratios: 10 % — ABNORMAL LOW (ref 20–55)
TIBC: 156 ug/dL — AB (ref 250–470)
UIBC: 140 ug/dL (ref 125–400)

## 2015-03-01 LAB — FOLATE: Folate: 15.4 ng/mL

## 2015-03-01 LAB — FERRITIN: FERRITIN: 408 ng/mL — AB (ref 10–291)

## 2015-03-01 MED ORDER — BLISTEX MEDICATED EX OINT
TOPICAL_OINTMENT | CUTANEOUS | Status: DC | PRN
  Filled 2015-03-01: qty 10

## 2015-03-01 MED ORDER — INSULIN ASPART 100 UNIT/ML ~~LOC~~ SOLN
0.0000 [IU] | Freq: Three times a day (TID) | SUBCUTANEOUS | Status: DC
  Administered 2015-03-01 – 2015-03-04 (×5): 1 [IU] via SUBCUTANEOUS

## 2015-03-01 MED ORDER — POLYETHYLENE GLYCOL 3350 17 G PO PACK
17.0000 g | PACK | Freq: Once | ORAL | Status: AC
  Administered 2015-03-01: 17 g via ORAL

## 2015-03-01 MED ORDER — LIP MEDEX EX OINT: TOPICAL_OINTMENT | CUTANEOUS | Status: DC | PRN

## 2015-03-01 MED ORDER — METHOCARBAMOL 750 MG PO TABS: 750.0000 mg | ORAL_TABLET | Freq: Three times a day (TID) | ORAL | Status: DC | PRN

## 2015-03-01 NOTE — Anesthesia Postprocedure Evaluation (Signed)
  Anesthesia Post-op Note  Patient: [name]  Procedure(s) Performed: Procedure(s): INCISION AND DRAINAGE ABSCESS ABDOMINAL WALL (N/A)  Patient Location: PACU  Anesthesia Type: General   Level of Consciousness: awake, alert  and oriented  Airway and Oxygen Therapy: Patient Spontanous Breathing  Post-op Pain: mild  Post-op Assessment: Post-op Vital signs reviewed  Post-op Vital Signs: Reviewed  Last Vitals:  Filed Vitals:   03/01/15 1415  BP: 116/49  Pulse: 70  Temp: 36.7 C  Resp: 16    Complications: No apparent anesthesia complications

## 2015-03-01 NOTE — Progress Notes (Signed)
PT Cancellation Note  Patient Details Name: Jennifer [name] MRN: [id number] DOB: 10/15/1925   Cancelled Treatment:    Reason Eval/Treat Not Completed: Patient declined, no reason specified Pt declined participating in PT evaluation. Explained importance of mobility and getting out of bed however pt continued to refuse. Will follow up next available time as pt allows/is willing to participate.   Alvie HeidelbergFolan, [name] 03/01/2015, 4:30 PM Alvie HeidelbergShauna Folan, PT, DPT [phone]

## 2015-03-01 NOTE — Progress Notes (Signed)
[facility] Progress Note  1 Day Post-Op  Subjective: Pt in some pain, but significantly improved.  No N/V, tolerating diet well.  Hungry/thirsty.  Not OOB yet.   Foley in place.  Some flatus, no BM yet.    Objective: Vital signs in last 24 hours: Temp:  [98.1 F (36.7 C)-98.9 F (37.2 C)] 98.4 F (36.9 C) (03/30 0410) Pulse Rate:  [71-80] 71 (03/30 0410) Resp:  [15-25] 16 (03/30 0410) BP: (105-135)/(49-63) 124/55 mmHg (03/30 0410) SpO2:  [91 %-98 %] 91 % (03/30 0410) Weight:  [59.1 kg (130 lb 4.7 oz)-64.9 kg (143 lb 1.3 oz)] 64.9 kg (143 lb 1.3 oz) (03/30 0410) Last BM Date: 02/27/15  Intake/Output from previous day: 03/29 0701 - 03/30 0700 In: 1475 [P.O.:480; I.V.:995] Out: 966 [Urine:966] Intake/Output this shift:    PE: Gen:  Alert, NAD, pleasant Abd: Soft, ND, tender over incision site/open wound, +BS, no HSM, midline wound cavity with foul smelling purulent drainage, some yellow slough at wound base.   Lab Results:   Recent Labs  02/28/15 1240 03/01/15 0359  WBC 16.1* 11.8*  HGB 8.1* 7.5*  HCT 25.6* 23.9*  PLT 351 320   BMET  Recent Labs  02/28/15 1240 03/01/15 0359  NA 139 140  K 3.5 3.8  CL 106 108  CO2 24 26  GLUCOSE 113* 88  BUN 18 12  CREATININE 1.04 1.03  CALCIUM 9.6 9.3   PT/INR No results for input(s): LABPROT, INR in the last 72 hours. CMP     Component Value Date/Time   NA 140 03/01/2015 0359   K 3.8 03/01/2015 0359   CL 108 03/01/2015 0359   CO2 26 03/01/2015 0359   GLUCOSE 88 03/01/2015 0359   BUN 12 03/01/2015 0359   CREATININE 1.03 03/01/2015 0359   CALCIUM 9.3 03/01/2015 0359   PROT 7.2 02/20/2015 0933   ALBUMIN 3.7 02/20/2015 0933   AST 13 02/20/2015 0933   ALT 8 02/20/2015 0933   ALKPHOS 54 02/20/2015 0933   BILITOT 0.4 02/20/2015 0933   GFRNONAA 47* 03/01/2015 0359   GFRAA 55* 03/01/2015 0359   Lipase     Component Value Date/Time   LIPASE 42 08/28/2014 0844       Studies/Results: Ct Abdomen  Pelvis W Contrast  02/28/2015   CLINICAL DATA:  81 year old female with abdominal pain for 1 week. Cellulitis. Initial encounter.  EXAM: CT ABDOMEN AND PELVIS WITH CONTRAST  TECHNIQUE: Multidetector CT imaging of the abdomen and pelvis was performed using the standard protocol following bolus administration of intravenous contrast.  CONTRAST:  80 mL Omnipaque 300.  COMPARISON:  Chest abdomen and pelvis CTA 09/29/2014.  FINDINGS: Since 2015, distal thoracic aorta endograft re- identified. It appears the native thoracic aortic aneurysm sac has regressed since that time. Largely resolved bibasilar atelectasis and pleural effusions. Stable cardiomegaly. No pericardial effusion.  Scoliosis and degenerative changes in the spine. No acute osseous abnormality identified.  No pelvic free fluid.  Increased stool in the rectum.  New since the prior study is an abnormal appearance of the inferior rectus abdominal muscles, more so the right. There is also abnormal density in the overlying suprapubic region abdominal wall subcutaneous fat. Lobulated low to intermediate density masslike enlargement of the soft tissues here with peripheral and internal septal type hyper enhancement. The abnormal mass bulges into the ventral peritoneal cavity, and there is inflammation in the space of Retzius, also with 1 or 2 finger type projections directed toward the dome of  the bladder (see sagittal image 54). The rectus abnormality encompasses 42 x 81 x 85 mm (AP by transverse by CC). The overlying abnormal subcutaneous fat then encompasses another 68 cm diameter.  There is mild thickening of the anterior dome of the bladder nearest the abnormality. There is a trace amount of gas within the bladder (series 2, image 71). There is a left lateral bladder diverticulum versus simple fluid density left adnexal cyst adjacent to the bladder measuring 2.5 cm on series 2, image 68.  Uterus and adnexa otherwise appear surgically absent. Diverticulosis of  the sigmoid colon which does not appear involved in the above abnormality. Diverticulosis continues to the splenic flexure with no associated active inflammation. There is diverticulosis at the hepatic flexure and involving the right colon. Appendix not identified. Negative terminal ileum. No dilated or inflamed small bowel loops are identified. Oral contrast has not yet reached the distal small bowel. Negative stomach and duodenum.  No abdominal free fluid or free air. Stable and negative liver, gallbladder (decompressed), spleen, pancreas, and adrenal glands. Both kidneys appear stable with symmetric renal enhancement and contrast excretion.  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 No lymphadenopathy.  IMPRESSION: 1. Severely abnormal lower rectus abdominal muscles right greater than left with lobulated intermediate density and hyperenhancement encompassing 4.2 x 8.1 x 8.5 cm. This appearance is new since October and is favored to reflect superinfected spontaneous rectus muscle hematomas. Intramuscular abscess suspected. Confluent overlying subcutaneous fat inflammation/edema. 2. Secondary inflammation of the space of Retzius and anterior bladder wall. Trace gas within the bladder might be infectious in nature. 3. Improved appearance of the distal thoracic aortic aneurysm status post endograft placement. Improved bibasilar ventilation.   Electronically Signed   By: Odessa Fleming M.D.   On: 02/28/2015 14:16    Anti-infectives: Anti-infectives    Start     Dose/Rate Route Frequency Ordered Stop   03/01/15 1700  vancomycin (VANCOCIN) IVPB 750 mg/150 ml premix     750 mg 150 mL/hr over 60 Minutes Intravenous Every 24 hours 02/28/15 1631     03/01/15 0000  piperacillin-tazobactam (ZOSYN) IVPB 3.375 g     3.375 g 12.5 mL/hr over 240 Minutes Intravenous Every 8 hours 02/28/15 1631     02/28/15 1530  vancomycin (VANCOCIN)  1,250 mg in sodium chloride 0.9 % 250 mL IVPB     1,250 mg 166.7 mL/hr over 90 Minutes Intravenous  Once 02/28/15 1523 02/28/15 1736   02/28/15 1530  piperacillin-tazobactam (ZOSYN) IVPB 3.375 g     3.375 g 100 mL/hr over 30 Minutes Intravenous  Once 02/28/15 1524 02/28/15 1626       Assessment/Plan Rectus/abdominal wall abscess and cellulitis 4.2 x 8.1 x 8.5 cm - POD #1 s/p I&D of complex, multi-chambered abdominal wall abscess, debridement of necrotic fascia (Dr. Derrell Lolling) Leukocytosis - down to 11.8 -HH diet -Pain control, antiemetics, antibiotics (vanc and zosyn Day #2) -PT/OT, Ambulate and IS -SCD's and lovenox started -On home meds -WD dressing changes BID to midline wound cavity -D/c foley tomorrow  Chronic normocytic anemia H/o CAD DM Type II AAA s/p repair HTN    LOS: 1 day    Jennifer [name], Jennifer [name] 03/01/2015, 7:21 AM Pager: [phone]

## 2015-03-01 NOTE — Addendum Note (Signed)
Addendum  created 03/01/15 1934 by Sheldon Silvanavid Ketan Renz, MD   Modules edited: Notes Section   Notes Section:  File: [id number]

## 2015-03-01 NOTE — Anesthesia Postprocedure Evaluation (Signed)
Anesthesia H&P Update: History and Physical Exam reviewed; patient is OK for planned anesthetic and procedure. ? ?

## 2015-03-01 NOTE — Progress Notes (Signed)
[facility] PROGRESS NOTE  DYNASTI KERMAN ZOX:[id number] DOB: 04-05-1925 DOA: 02/28/2015 PCP: Kristie Cowman, MD  Assessment/Plan:  Principal Problem:   Abdominal wall abscess -Patient is growing gram-positive cocci in chains. Plan is to continue Zosyn and vancomycin until ID and sensitivities are available. - Patient is postop day #14 rectus/abdominal wall abscess and cellulitis and IND of complex multi chambered abdominal wall abscess and debridement of necrotic fascia by Dr. Derrell Lolling. - Gen. surgery on board as listed above and assisting with management -Agree with PT OT evaluation  Active Problems:   Diabetes - Once able to advance diet would advance to diabetic diet - Continue sliding scale insulin    HTN (hypertension) - Stable continue labetalol    Hyperlipidemia - Stable continue atorvastatin    Aneurysm, thoracic aortic - Stable   Code Status: full Family Communication: none at bedside Disposition Plan: pending continued improvement in condition.   Consultants:  General surgery  Procedures:  See above  Antibiotics:  Vanco and zosyn  HPI/Subjective: Pt feels better. Has no new complaints.  Objective: Filed Vitals:   03/01/15 1415  BP: 116/49  Pulse: 70  Temp: 98.1 F (36.7 C)  Resp: 16    Intake/Output Summary (Last 24 hours) at 03/01/15 1803 Last data filed at 03/01/15 1646  Gross per 24 hour  Intake   1625 ml  Output   1550 ml  Net     75 ml   Filed Weights   02/28/15 1517 03/01/15 0410  Weight: 59.1 kg (130 lb 4.7 oz) 64.9 kg (143 lb 1.3 oz)    Exam:   General:  Pt in nad, alert and awake  Cardiovascular: rrr, nomrg  Respiratory: cta bl, no wheezes  Abdomen: ND, no guarding,   Musculoskeletal: no cyanosis or clubbing   Data Reviewed: Basic Metabolic Panel:  Recent Labs Lab 02/28/15 1240 03/01/15 0359  NA 139 140  K 3.5 3.8  CL 106 108  CO2 24 26  GLUCOSE 113* 88  BUN 18 12  CREATININE 1.04 1.03  CALCIUM  9.6 9.3   Liver Function Tests: No results for input(s): AST, ALT, ALKPHOS, BILITOT, PROT, ALBUMIN in the last 168 hours. No results for input(s): LIPASE, AMYLASE in the last 168 hours. No results for input(s): AMMONIA in the last 168 hours. CBC:  Recent Labs Lab 02/28/15 1240 03/01/15 0359  WBC 16.1* 11.8*  NEUTROABS 12.6*  --   HGB 8.1* 7.5*  HCT 25.6* 23.9*  MCV 89.8 90.2  PLT 351 320   Cardiac Enzymes: No results for input(s): CKTOTAL, CKMB, CKMBINDEX, TROPONINI in the last 168 hours. BNP (last 3 results) No results for input(s): BNP in the last 8760 hours.  ProBNP (last 3 results) No results for input(s): PROBNP in the last 8760 hours.  CBG:  Recent Labs Lab 02/28/15 1944 02/28/15 2346 03/01/15 0359 03/01/15 0730 03/01/15 1228  GLUCAP 90 105* 93 87 85    Recent Results (from the past 240 hour(s))  Urine culture     Status: None (Preliminary result)   Collection Time: 02/28/15  4:43 PM  Result Value Ref Range Status   Specimen Description URINE, CATHETERIZED  Final   Special Requests PATIENT ON FOLLOWING VANCOMYCIN ZOSYN  Final   Colony Count   Final    >=100,000 COLONIES/ML Performed at [facility]    Culture   Final    GRAM NEGATIVE RODS Performed at [facility]    Report Status PENDING  Incomplete  Anaerobic culture  Status: None (Preliminary result)   Collection Time: 02/28/15  5:02 PM  Result Value Ref Range Status   Specimen Description ABSCESS ABDOMEN  Final   Special Requests PATIENT ON FOLLOWING VANCOMYCIN, ZOSYN  Final   Gram Stain   Final    ABUNDANT WBC PRESENT,BOTH PMN AND MONONUCLEAR NO SQUAMOUS EPITHELIAL CELLS SEEN ABUNDANT GRAM POSITIVE COCCI IN CHAINS Performed at [facility]    Culture   Final    NO ANAEROBES ISOLATED; CULTURE IN PROGRESS FOR 5 DAYS Performed at [facility]    Report Status PENDING  Incomplete  Culture, routine-abscess     Status: None (Preliminary result)   Collection  Time: 02/28/15  5:02 PM  Result Value Ref Range Status   Specimen Description ABSCESS ABDOMEN  Final   Special Requests PATIENT ON FOLLOWING ZOSYN, VANCOMYCIN  Final   Gram Stain   Final    ABUNDANT WBC PRESENT,BOTH PMN AND MONONUCLEAR NO SQUAMOUS EPITHELIAL CELLS SEEN ABUNDANT GRAM POSITIVE COCCI IN CHAINS Performed at [facility]    Culture PENDING  Incomplete   Report Status PENDING  Incomplete     Studies: Ct Abdomen Pelvis W Contrast  02/28/2015   CLINICAL DATA:  81 year old female with abdominal pain for 1 week. Cellulitis. Initial encounter.  EXAM: CT ABDOMEN AND PELVIS WITH CONTRAST  TECHNIQUE: Multidetector CT imaging of the abdomen and pelvis was performed using the standard protocol following bolus administration of intravenous contrast.  CONTRAST:  80 mL Omnipaque 300.  COMPARISON:  Chest abdomen and pelvis CTA 09/29/2014.  FINDINGS: Since 2015, distal thoracic aorta endograft re- identified. It appears the native thoracic aortic aneurysm sac has regressed since that time. Largely resolved bibasilar atelectasis and pleural effusions. Stable cardiomegaly. No pericardial effusion.  Scoliosis and degenerative changes in the spine. No acute osseous abnormality identified.  No pelvic free fluid.  Increased stool in the rectum.  New since the prior study is an abnormal appearance of the inferior rectus abdominal muscles, more so the right. There is also abnormal density in the overlying suprapubic region abdominal wall subcutaneous fat. Lobulated low to intermediate density masslike enlargement of the soft tissues here with peripheral and internal septal type hyper enhancement. The abnormal mass bulges into the ventral peritoneal cavity, and there is inflammation in the space of Retzius, also with 1 or 2 finger type projections directed toward the dome of the bladder (see sagittal image 54). The rectus abnormality encompasses 42 x 81 x 85 mm (AP by transverse by CC). The overlying  abnormal subcutaneous fat then encompasses another 68 cm diameter.  There is mild thickening of the anterior dome of the bladder nearest the abnormality. There is a trace amount of gas within the bladder (series 2, image 71). There is a left lateral bladder diverticulum versus simple fluid density left adnexal cyst adjacent to the bladder measuring 2.5 cm on series 2, image 68.  Uterus and adnexa otherwise appear surgically absent. Diverticulosis of the sigmoid colon which does not appear involved in the above abnormality. Diverticulosis continues to the splenic flexure with no associated active inflammation. There is diverticulosis at the hepatic flexure and involving the right colon. Appendix not identified. Negative terminal ileum. No dilated or inflamed small bowel loops are identified. Oral contrast has not yet reached the distal small bowel. Negative stomach and duodenum.  No abdominal free fluid or free air. Stable and negative liver, gallbladder (decompressed), spleen, pancreas, and adrenal glands. Both kidneys appear stable with symmetric renal enhancement and  contrast excretion.  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 No lymphadenopathy.  IMPRESSION: 1. Severely abnormal lower rectus abdominal muscles right greater than left with lobulated intermediate density and hyperenhancement encompassing 4.2 x 8.1 x 8.5 cm. This appearance is new since October and is favored to reflect superinfected spontaneous rectus muscle hematomas. Intramuscular abscess suspected. Confluent overlying subcutaneous fat inflammation/edema. 2. Secondary inflammation of the space of Retzius and anterior bladder wall. Trace gas within the bladder might be infectious in nature. 3. Improved appearance of the distal thoracic aortic aneurysm status post endograft placement. Improved bibasilar ventilation.   Electronically Signed   By: Odessa FlemingH  Hall  M.D.   On: 02/28/2015 14:16    Scheduled Meds: . atorvastatin  20 mg Oral q1800  . calcium carbonate  1,250 mg Oral BID WC  . docusate sodium  100 mg Oral Daily  . enoxaparin (LOVENOX) injection  30 mg Subcutaneous Q24H  . felodipine  10 mg Oral Daily  . gemfibrozil  600 mg Oral BID AC  . insulin aspart  0-9 Units Subcutaneous TID WC & HS  . labetalol  400 mg Oral TID  . piperacillin-tazobactam (ZOSYN)  IV  3.375 g Intravenous Q8H  . vancomycin  750 mg Intravenous Q24H  . [START ON 03/06/2015] Vitamin D (Ergocalciferol)  50,000 Units Oral Q7 days   Continuous Infusions: . 0.9 % NaCl with KCl 20 mEq / L 100 mL/hr at 03/01/15 0859      Time spent: > 35 minutes    Penny PiaVEGA, Mone Commisso  [facility] Pager [phone] If 7PM-7AM, please contact night-coverage at [email or web address], password [email or web address] 03/01/2015, 6:03 PM  LOS: 1 day

## 2015-03-02 DIAGNOSIS — B9689 Other specified bacterial agents as the cause of diseases classified elsewhere: Secondary | ICD-10-CM

## 2015-03-02 DIAGNOSIS — N3090 Cystitis, unspecified without hematuria: Secondary | ICD-10-CM

## 2015-03-02 DIAGNOSIS — D509 Iron deficiency anemia, unspecified: Secondary | ICD-10-CM

## 2015-03-02 LAB — GLUCOSE, CAPILLARY
Glucose-Capillary: 119 mg/dL — ABNORMAL HIGH (ref 70–99)
Glucose-Capillary: 135 mg/dL — ABNORMAL HIGH (ref 70–99)
Glucose-Capillary: 135 mg/dL — ABNORMAL HIGH (ref 70–99)
Glucose-Capillary: 110 mg/dL — ABNORMAL HIGH (ref 70–99)

## 2015-03-02 LAB — URINE CULTURE

## 2015-03-02 MED ORDER — POLYETHYLENE GLYCOL 3350 17 G PO PACK
17.0000 g | PACK | Freq: Every day | ORAL | Status: DC
  Administered 2015-03-02: 17 g via ORAL
  Filled 2015-03-02 (×2): qty 1

## 2015-03-02 MED ORDER — FERROUS SULFATE 325 (65 FE) MG PO TABS
325.0000 mg | ORAL_TABLET | Freq: Two times a day (BID) | ORAL | Status: DC
  Administered 2015-03-02 – 2015-03-04 (×4): 325 mg via ORAL
  Filled 2015-03-02 (×4): qty 1

## 2015-03-02 MED ORDER — MORPHINE SULFATE 2 MG/ML IJ SOLN: 1.0000 mg | INTRAMUSCULAR | Status: DC | PRN

## 2015-03-02 MED ORDER — PHENAZOPYRIDINE HCL 100 MG PO TABS
100.0000 mg | ORAL_TABLET | Freq: Three times a day (TID) | ORAL | Status: DC
Start: 2015-03-02 — End: 2015-03-04
  Administered 2015-03-02 – 2015-03-04 (×8): 100 mg via ORAL
  Filled 2015-03-02 (×9): qty 1

## 2015-03-02 NOTE — Evaluation (Signed)
Physical Therapy Evaluation Patient Details Name: Jennifer MusselJanie B Koepp MRN: [id number] DOB: 03/28/1925 Today's Date: 03/02/2015   History of Present Illness  Patient is a 81 y/o female POD 2 s/p I&D of complex multi chambered abdominal wall abscess and debridement of necrotic fascia secondary to Rectus/abdominal wall abscess and cellulitis. PMH of DM, HTN and AAA s/p repair.     Clinical Impression  Patient presents with generalized weakness, balance deficits and possible cognitive deficits impacting safe mobility. Pt with 1 major LOB when exiting bathroom resulting in need for Max A to prevent fall. Pt adamant about not going to rehab however concerned about pt's safety at home as pt very unsteady during gait training. If pt continues to refuse ST SNF, recommend 24/7 S and hands on assist for safety. Will continue to follow to improve transfers, gait, balance and safety awareness while in hospital.    Follow Up Recommendations Home health PT;Supervision/Assistance - 24 hour Would benefit from ST SNF    Equipment Recommendations  None recommended by PT    Recommendations for Other Services OT consult     Precautions / Restrictions Precautions Precautions: Fall Restrictions Weight Bearing Restrictions: No      Mobility  Bed Mobility Overal bed mobility: Needs Assistance Bed Mobility: Sit to Sidelying;Rolling Rolling: Min guard       Sit to sidelying: Min guard;HOB elevated General bed mobility comments: Use of rails for support. Cues for log roll technique.  Transfers Overall transfer level: Needs assistance Equipment used: Rolling walker (2 wheeled) Transfers: Sit to/from Stand Sit to Stand: Min guard         General transfer comment: Min guard for safety. Stood from Newell Rubbermaidchair x1, from toilet x2. Unsteady.  Ambulation/Gait Ambulation/Gait assistance: Max assist;Min assist Ambulation Distance (Feet): 150 Feet Assistive device: Rolling walker (2 wheeled) Gait  Pattern/deviations: Step-through pattern;Decreased stride length;Drifts right/left;Staggering left;Staggering right   Gait velocity interpretation: Below normal speed for age/gender General Gait Details: Pt veering to right/left and difficulty maintaining CoM within RW. Very unsteady. 1 major LOB when exiting bathroom requiring Max A to prevent fall.   Stairs            Wheelchair Mobility    Modified Rankin (Stroke Patients Only)       Balance Overall balance assessment: Needs assistance Sitting-balance support: Feet supported;No upper extremity supported Sitting balance-Leahy Scale: Fair     Standing balance support: During functional activity Standing balance-Leahy Scale: Poor                               Pertinent Vitals/Pain Pain Assessment: No/denies pain    Home Living Family/patient expects to be discharged to:: Private residence Living Arrangements: Spouse/significant other Available Help at Discharge: Family;Available 24 hours/day Type of Home: House Home Access: Stairs to enter Entrance Stairs-Rails: None Entrance Stairs-Number of Steps: 3 Home Layout: One level Home Equipment: Environmental consultantWalker - 2 wheels      Prior Function Level of Independence: Independent               Hand Dominance   Dominant Hand: Right    Extremity/Trunk Assessment   Upper Extremity Assessment: Defer to OT evaluation           Lower Extremity Assessment: Generalized weakness         Communication   Communication: No difficulties  Cognition Arousal/Alertness: Awake/alert Behavior During Therapy: WFL for tasks assessed/performed Overall Cognitive Status: No family/caregiver present to  determine baseline cognitive functioning (Tangential speech noted at times, poor historian and difficult to follow.)                      General Comments General comments (skin integrity, edema, etc.): Family not present during session. Pt adamant about not  going to SNF or having follow up therapy services.     Exercises        Assessment/Plan    PT Assessment Patient needs continued PT services  PT Diagnosis Generalized weakness;Difficulty walking   PT Problem List Decreased strength;Decreased cognition;Decreased activity tolerance;Decreased balance;Decreased mobility;Decreased safety awareness  PT Treatment Interventions Balance training;Gait training;Stair training;Patient/family education;Functional mobility training;Therapeutic activities;Therapeutic exercise   PT Goals (Current goals can be found in the Care Plan section) Acute Rehab PT Goals Patient Stated Goal: to return home PT Goal Formulation: With patient Time For Goal Achievement: 03/16/15 Potential to Achieve Goals: Fair    Frequency Min 3X/week   Barriers to discharge        Co-evaluation               End of Session Equipment Utilized During Treatment: Gait belt Activity Tolerance: Patient limited by fatigue Patient left: in bed;with call bell/phone within reach;with bed alarm set Nurse Communication: Mobility status         Time: 1610-9604 PT Time Calculation (min) (ACUTE ONLY): 25 min   Charges:   PT Evaluation $Initial PT Evaluation Tier I: 1 Procedure PT Treatments $Gait Training: 8-22 mins   PT G CodesAlvie Heidelberg A March 27, 2015, 3:13 PM Alvie Heidelberg, PT, DPT [phone]

## 2015-03-02 NOTE — Progress Notes (Signed)
OT Cancellation Note  Patient Details Name: [name] MRN: [id number] DOB: 12/29/1924   Cancelled Treatment:    Reason Eval/Treat Not Completed: Fatigue/lethargy limiting ability to participate. Pt reports that she got up this AM with PT and walked down the hallway and got very wobbly as well as she got up numerus times during the night trying to urinate without success and she is just worn out. Will try eval tomorrow.  Evette GeorgesLeonard, Ignacio Lowder Eva [phone] 03/02/2015, 3:00 PM

## 2015-03-02 NOTE — Progress Notes (Signed)
2 Days Post-Op  Subjective: Alert and stable. Frustrated because of urinary urgency and frequency. Voiding normal amounts per RN. Bladder scan not done. No nausea or vomiting. Pain better. Dressings being changed twice daily.  Urine culture growing gram-negative rods. Wound culture growing gram-positive cocci in chains. On vancomycin and Zosyn pending culture results   Objective: Vital signs in last 24 hours: Temp:  [98.1 F (36.7 C)-98.4 F (36.9 C)] 98.2 F (36.8 C) (03/31 0535) Pulse Rate:  [64-70] 66 (03/31 0535) Resp:  [16-18] 16 (03/31 0535) BP: (99-117)/(49-60) 99/49 mmHg (03/31 0535) SpO2:  [91 %-92 %] 92 % (03/31 0535) Last BM Date: 02/27/15  Intake/Output from previous day: 03/30 0701 - 03/31 0700 In: 840 [P.O.:840] Out: 1150 [Urine:1150] Intake/Output this shift: Total I/O In: -  Out: 200 [Urine:200]  General appearance: Alert, oriented, cooperative. A little frustrated and agitated primarily due to urinary symptoms. Skin warm and dry GI: Abdomen soft and nontender. A little bit distended. Midline wound redressed. Less purulence. Less odor. Still a little necrotic tissue that should clean up with dressing changes. Repacked.  Lab Results:  Results for orders placed or performed during the hospital encounter of 02/28/15 (from the past 24 hour(s))  Glucose, capillary     Status: None   Collection Time: 03/01/15  7:30 AM  Result Value Ref Range   Glucose-Capillary 87 70 - 99 mg/dL  Glucose, capillary     Status: None   Collection Time: 03/01/15 12:28 PM  Result Value Ref Range   Glucose-Capillary 85 70 - 99 mg/dL  Glucose, capillary     Status: Abnormal   Collection Time: 03/01/15  6:06 PM  Result Value Ref Range   Glucose-Capillary 122 (H) 70 - 99 mg/dL  Glucose, capillary     Status: Abnormal   Collection Time: 03/01/15 10:18 PM  Result Value Ref Range   Glucose-Capillary 109 (H) 70 - 99 mg/dL     Studies/Results: No results found.  Marland Kitchen. atorvastatin   20 mg Oral q1800  . calcium carbonate  1,250 mg Oral BID WC  . docusate sodium  100 mg Oral Daily  . enoxaparin (LOVENOX) injection  30 mg Subcutaneous Q24H  . felodipine  10 mg Oral Daily  . gemfibrozil  600 mg Oral BID AC  . insulin aspart  0-9 Units Subcutaneous TID WC & HS  . labetalol  400 mg Oral TID  . piperacillin-tazobactam (ZOSYN)  IV  3.375 g Intravenous Q8H  . polyethylene glycol  17 g Oral Daily  . vancomycin  750 mg Intravenous Q24H  . [START ON 03/06/2015] Vitamin D (Ergocalciferol)  50,000 Units Oral Q7 days     Assessment/Plan: s/p Procedure(s): INCISION AND DRAINAGE ABSCESS ABDOMINAL WALL  Rectus/abdominal wall abscess and cellulitis 4.2 x 8.1 x 8.5 cm - POD #2 s/p I&D of complex, multi-chambered abdominal wall abscess, debridement of necrotic fascia (Dr. Derrell LollingIngram)  UTI-gram-negative rods. Sensitivities pending. Continue Zosyn  Anemia-hemoglobin 7.5 yesterday. Was 8.1 on admission. No active bleeding. Recheck tomorrow. Tolerating. Transfuse for hemoglobin less than 7.  Leukocytosis - down to 11.8 -HH diet -Pain control, antiemetics, antibiotics (vanc and zosyn Day #2) -PT/OT, Ambulate and IS -SCD's and lovenox started -On home meds   Chronic normocytic anemia H/o CAD DM Type II AAA s/p repair HTN   @PROBHOSP @  LOS: 2 days    Jennifer [name] M 03/02/2015  . .prob

## 2015-03-02 NOTE — Progress Notes (Addendum)
[facility] PROGRESS NOTE  Jennifer [name] EAV:[id number] DOB: 01/16/1925 DOA: 02/28/2015 PCP: Kristie Cowman, MD  Brief narrative 81 year old female with history of coronary disease, type 2 diabetes mellitus, hypertension, history of AAA repair, presented to the ED with abdominal pain for almost 1 week. A CT scan in the ED showed a abnormal lower rectus abdominis muscle with low related intermediate density suggestive of cellulitis versus abscess. Admitted to hospitalist service and surgery consulted. Patient underwent I&D of the complex multichambered abdominal wall abscess with debridement of necrotic fascia on 3/29.  Assessment/Plan: Complex abdominal wall abscess Status post I&D  with debridement of the necrotic fascia on 3/29. Wound Culture growing GPC in chains Continue vancomycin and Zosyn until sensitivities available.  Afebrile and leukocytosis improved. -when necessary pain medications. -Surgery following.   Klebsiella UTI Pansensitive. Continue empiric Zosyn.  Iron deficiency anemia Baseline hemoglobin of 8-9. Will start on iron supplements.  Coronary Artery disease Stable. Continue labetalol and statin  Type 2 diabetes mellitus Last A1c of 6.4. Metformin held. Monitor on sliding scale insulin  Dyslipidemia Continue statin  Essential hypertension Continue home blood pressure medications. Stable  History of AAA repair Stable  Code Status: Full code Family Communication: Husband and son at bedside Disposition Plan: Pending PT eval and final culture results.. Possibly home in next 48 hrs   Consultants:  [place] [facility]  Procedures:  I&D of abdominal wall abscess  Antibiotics:  IV vancomycin and Zosyn since 3/29  HPI/Subjective: Patient seen and examined. Complains of lower abdominal pain. Afebrile  Objective: Filed Vitals:   03/02/15 1300  BP:   Pulse: 45  Temp: 97.5 F (36.4 C)  Resp: 12    Intake/Output Summary (Last 24 hours) at  03/02/15 1456 Last data filed at 03/02/15 0900  Gross per 24 hour  Intake 3286.67 ml  Output   1150 ml  Net 2136.67 ml   Filed Weights   02/28/15 1517 03/01/15 0410  Weight: 59.1 kg (130 lb 4.7 oz) 64.9 kg (143 lb 1.3 oz)    Exam:   General: Elderly female in no acute distress  HEENT: Pallor present, moist oral mucosa, neck supple  Chest: Clear to auscultation bilaterally, no added sounds  CVS: Normal S1 and S2, no murmurs rub or gallop  GI: Dressing over midline abdomen with minimal purulence with packing done. Tender to palpation, bowel sounds present  Musculoskeletal: Warm, no edema  CNS: Alert and oriented  Data Reviewed: Basic Metabolic Panel:  Recent Labs Lab 02/28/15 1240 03/01/15 0359  NA 139 140  K 3.5 3.8  CL 106 108  CO2 24 26  GLUCOSE 113* 88  BUN 18 12  CREATININE 1.04 1.03  CALCIUM 9.6 9.3   Liver Function Tests: No results for input(s): AST, ALT, ALKPHOS, BILITOT, PROT, ALBUMIN in the last 168 hours. No results for input(s): LIPASE, AMYLASE in the last 168 hours. No results for input(s): AMMONIA in the last 168 hours. CBC:  Recent Labs Lab 02/28/15 1240 03/01/15 0359  WBC 16.1* 11.8*  NEUTROABS 12.6*  --   HGB 8.1* 7.5*  HCT 25.6* 23.9*  MCV 89.8 90.2  PLT 351 320   Cardiac Enzymes: No results for input(s): CKTOTAL, CKMB, CKMBINDEX, TROPONINI in the last 168 hours. BNP (last 3 results) No results for input(s): BNP in the last 8760 hours.  ProBNP (last 3 results) No results for input(s): PROBNP in the last 8760 hours.  CBG:  Recent Labs Lab 03/01/15 1228 03/01/15 1806 03/01/15 2218 03/02/15 0805 03/02/15  1229  GLUCAP 85 122* 109* 119* 135*    Recent Results (from the past 240 hour(s))  Urine culture     Status: None   Collection Time: 02/28/15  4:43 PM  Result Value Ref Range Status   Specimen Description URINE, CATHETERIZED  Final   Special Requests PATIENT ON FOLLOWING VANCOMYCIN ZOSYN  Final   Colony Count    Final    >=100,000 COLONIES/ML Performed at [facility]    Culture   Final    KLEBSIELLA PNEUMONIAE Performed at [facility]    Report Status 03/02/2015 FINAL  Final   Organism ID, Bacteria KLEBSIELLA PNEUMONIAE  Final      Susceptibility   Klebsiella pneumoniae - MIC*    AMPICILLIN RESISTANT      CEFAZOLIN <=4 SENSITIVE Sensitive     CEFTRIAXONE <=1 SENSITIVE Sensitive     CIPROFLOXACIN <=0.25 SENSITIVE Sensitive     GENTAMICIN <=1 SENSITIVE Sensitive     LEVOFLOXACIN <=0.12 SENSITIVE Sensitive     NITROFURANTOIN 64 INTERMEDIATE Intermediate     TOBRAMYCIN <=1 SENSITIVE Sensitive     TRIMETH/SULFA <=20 SENSITIVE Sensitive     PIP/TAZO <=4 SENSITIVE Sensitive     * KLEBSIELLA PNEUMONIAE  Anaerobic culture     Status: None (Preliminary result)   Collection Time: 02/28/15  5:02 PM  Result Value Ref Range Status   Specimen Description ABSCESS ABDOMEN  Final   Special Requests PATIENT ON FOLLOWING VANCOMYCIN, ZOSYN  Final   Gram Stain   Final    ABUNDANT WBC PRESENT,BOTH PMN AND MONONUCLEAR NO SQUAMOUS EPITHELIAL CELLS SEEN ABUNDANT GRAM POSITIVE COCCI IN CHAINS Performed at [facility]    Culture   Final    NO ANAEROBES ISOLATED; CULTURE IN PROGRESS FOR 5 DAYS Performed at [facility]    Report Status PENDING  Incomplete  Culture, routine-abscess     Status: None (Preliminary result)   Collection Time: 02/28/15  5:02 PM  Result Value Ref Range Status   Specimen Description ABSCESS ABDOMEN  Final   Special Requests PATIENT ON FOLLOWING ZOSYN, VANCOMYCIN  Final   Gram Stain   Final    ABUNDANT WBC PRESENT,BOTH PMN AND MONONUCLEAR NO SQUAMOUS EPITHELIAL CELLS SEEN ABUNDANT GRAM POSITIVE COCCI IN CHAINS Performed at [facility]    Culture   Final    Culture reincubated for better growth Performed at [facility]    Report Status PENDING  Incomplete     Studies: No results found.  Scheduled Meds: . atorvastatin   20 mg Oral q1800  . calcium carbonate  1,250 mg Oral BID WC  . docusate sodium  100 mg Oral Daily  . enoxaparin (LOVENOX) injection  30 mg Subcutaneous Q24H  . felodipine  10 mg Oral Daily  . gemfibrozil  600 mg Oral BID AC  . insulin aspart  0-9 Units Subcutaneous TID WC & HS  . labetalol  400 mg Oral TID  . phenazopyridine  100 mg Oral TID WC  . piperacillin-tazobactam (ZOSYN)  IV  3.375 g Intravenous Q8H  . polyethylene glycol  17 g Oral Daily  . vancomycin  750 mg Intravenous Q24H  . [START ON 03/06/2015] Vitamin D (Ergocalciferol)  50,000 Units Oral Q7 days   Continuous Infusions: . 0.9 % NaCl with KCl 20 mEq / L 50 mL/hr at 03/02/15 0800      Time spent: 25 minutes    Tesa Meadors  [facility] Pager [phone]. If 7PM-7AM, please contact  night-coverage at [email or web address], password [facility] 03/02/2015, 2:56 PM  LOS: 2 days

## 2015-03-03 LAB — BASIC METABOLIC PANEL
ANION GAP: 5 (ref 5–15)
BUN: 10 mg/dL (ref 6–23)
CALCIUM: 9.2 mg/dL (ref 8.4–10.5)
CO2: 24 mmol/L (ref 19–32)
CREATININE: 1.02 mg/dL (ref 0.50–1.10)
Chloride: 112 mmol/L (ref 96–112)
GFR calc Af Amer: 56 mL/min — ABNORMAL LOW (ref >90)
GFR, EST NON AFRICAN AMERICAN: 48 mL/min — AB (ref >90)
GLUCOSE: 100 mg/dL — AB (ref 70–99)
Potassium: 4.2 mmol/L (ref 3.5–5.1)
SODIUM: 141 mmol/L (ref 135–145)

## 2015-03-03 LAB — URINALYSIS, ROUTINE W REFLEX MICROSCOPIC
Bilirubin Urine: NEGATIVE
Glucose, UA: NEGATIVE mg/dL
Hgb urine dipstick: NEGATIVE
Ketones, ur: NEGATIVE mg/dL
LEUKOCYTES UA: NEGATIVE
Nitrite: POSITIVE — AB
Protein, ur: NEGATIVE mg/dL
SPECIFIC GRAVITY, URINE: 1.008 (ref 1.005–1.030)
Urobilinogen, UA: 0.2 mg/dL (ref 0.0–1.0)
pH: 6 (ref 5.0–8.0)

## 2015-03-03 LAB — CBC
HCT: 25.7 % — ABNORMAL LOW (ref 36.0–46.0)
HEMOGLOBIN: 8 g/dL — AB (ref 12.0–15.0)
MCH: 28.5 pg (ref 26.0–34.0)
MCHC: 31.1 g/dL (ref 30.0–36.0)
MCV: 91.5 fL (ref 78.0–100.0)
Platelets: 328 10*3/uL (ref 150–400)
RBC: 2.81 MIL/uL — ABNORMAL LOW (ref 3.87–5.11)
RDW: 16 % — AB (ref 11.5–15.5)
WBC: 9.1 10*3/uL (ref 4.0–10.5)

## 2015-03-03 LAB — GLUCOSE, CAPILLARY
GLUCOSE-CAPILLARY: 133 mg/dL — AB (ref 70–99)
Glucose-Capillary: 104 mg/dL — ABNORMAL HIGH (ref 70–99)
Glucose-Capillary: 89 mg/dL (ref 70–99)
Glucose-Capillary: 102 mg/dL — ABNORMAL HIGH (ref 70–99)

## 2015-03-03 LAB — URINE MICROSCOPIC-ADD ON

## 2015-03-03 MED ORDER — CEPHALEXIN 500 MG PO CAPS
500.0000 mg | ORAL_CAPSULE | Freq: Three times a day (TID) | ORAL | Status: DC
  Administered 2015-03-03 – 2015-03-04 (×2): 500 mg via ORAL
  Filled 2015-03-03 (×2): qty 1

## 2015-03-03 MED ORDER — METRONIDAZOLE 500 MG PO TABS
500.0000 mg | ORAL_TABLET | Freq: Three times a day (TID) | ORAL | Status: DC
  Administered 2015-03-03 – 2015-03-04 (×2): 500 mg via ORAL
  Filled 2015-03-03 (×2): qty 1

## 2015-03-03 MED ORDER — AMOXICILLIN-POT CLAVULANATE 875-125 MG PO TABS: 1.0000 | ORAL_TABLET | Freq: Two times a day (BID) | ORAL | Status: DC

## 2015-03-03 MED ORDER — FENOFIBRATE 160 MG PO TABS
160.0000 mg | ORAL_TABLET | Freq: Every day | ORAL | Status: DC
  Administered 2015-03-04: 160 mg via ORAL
  Filled 2015-03-03: qty 1

## 2015-03-03 MED ORDER — SULFAMETHOXAZOLE-TRIMETHOPRIM 800-160 MG PO TABS: 1.0000 | ORAL_TABLET | Freq: Every day | ORAL | Status: DC

## 2015-03-03 NOTE — Care Management Note (Signed)
    Page 1 of 1   03/04/2015     12:08:38 PM CARE MANAGEMENT NOTE 03/04/2015  Patient:  Jennifer [name]   Account Number:  [id number]  Date Initiated:  03/03/2015  Documentation initiated by:  Ronny FlurryWILE,HEATHER  Subjective/Objective Assessment:     Action/Plan:   Anticipated DC Date:  03/05/2015   Anticipated DC Plan:  HOME W HOME HEALTH SERVICES         Choice offered to / List presented to:  C-1 Patient        HH arranged  HH-1 RN  HH-2 PT  HH-3 OT  HH-4 NURSE'S AIDE      [facility]   Status of service:  Completed, signed off Medicare Important Message given?  YES (If response is "NO", the following Medicare IM given date fields will be blank) Date Medicare IM given:  03/03/2015 Medicare IM given by:  Ronny FlurryWILE,HEATHER Date Additional Medicare IM given:   Additional Medicare IM given by:    Discharge Disposition:  HOME W HOME HEALTH SERVICES  Per UR Regulation:  Reviewed for med. necessity/level of care/duration of stay  If discussed at Long Length of Stay Meetings, dates discussed:    Comments:  03/04/15 12:05 [facility], Vira Blancoona to notify of pt discharge and [facility]/Aide/SW.  No other Cm needs were communicated.  Freddy JakschSarah Juanpablo Ciresi, BSN, CM [phone].  03-03-15 Spoke with patient and husband at bedside . Confirmed face sheet  information . Patient already has walker at home , offered to order bedside commode , patient and husband both do not believe patient needs bedside commode at this time . Ronny FlurryHeather wile RN BSN

## 2015-03-03 NOTE — Progress Notes (Signed)
Patient ID: Jennifer [name], female   DOB: 1925-06-27, 81 y.o.   MRN: [id number] 3 Days Post-Op  Subjective: Pt with no complaints today.  Does not want to go to SNF.  Wants to go home.  Objective: Vital signs in last 24 hours: Temp:  [97.5 F (36.4 C)-98.1 F (36.7 C)] 98.1 F (36.7 C) (04/01 0930) Pulse Rate:  [45-63] 63 (04/01 0930) Resp:  [12-16] 16 (04/01 0930) BP: (129-150)/(55-67) 150/60 mmHg (04/01 0930) SpO2:  [94 %-98 %] 98 % (04/01 0930) Weight:  [66.225 kg (146 lb)] 66.225 kg (146 lb) (04/01 0500) Last BM Date: 03/03/15  Intake/Output from previous day: 03/31 0701 - 04/01 0700 In: 1540 [P.O.:440; I.V.:800; IV Piggyback:300] Out: 225 [Urine:225] Intake/Output this shift: Total I/O In: 360 [P.O.:360] Out: -   PE: Abd: soft, wound is clean and packed, +BS, ND  Lab Results:   Recent Labs  03/01/15 0359 03/03/15 0415  WBC 11.8* 9.1  HGB 7.5* 8.0*  HCT 23.9* 25.7*  PLT 320 328   BMET  Recent Labs  03/01/15 0359 03/03/15 0415  NA 140 141  K 3.8 4.2  CL 108 112  CO2 26 24  GLUCOSE 88 100*  BUN 12 10  CREATININE 1.03 1.02  CALCIUM 9.3 9.2   PT/INR No results for input(s): LABPROT, INR in the last 72 hours. CMP     Component Value Date/Time   NA 141 03/03/2015 0415   K 4.2 03/03/2015 0415   CL 112 03/03/2015 0415   CO2 24 03/03/2015 0415   GLUCOSE 100* 03/03/2015 0415   BUN 10 03/03/2015 0415   CREATININE 1.02 03/03/2015 0415   CALCIUM 9.2 03/03/2015 0415   PROT 7.2 02/20/2015 0933   ALBUMIN 3.7 02/20/2015 0933   AST 13 02/20/2015 0933   ALT 8 02/20/2015 0933   ALKPHOS 54 02/20/2015 0933   BILITOT 0.4 02/20/2015 0933   GFRNONAA 48* 03/03/2015 0415   GFRAA 56* 03/03/2015 0415   Lipase     Component Value Date/Time   LIPASE 42 08/28/2014 0844       Studies/Results: No results found.  Anti-infectives: Anti-infectives    Start     Dose/Rate Route Frequency Ordered Stop   03/01/15 1700  vancomycin (VANCOCIN) IVPB 750 mg/150 ml  premix     750 mg 150 mL/hr over 60 Minutes Intravenous Every 24 hours 02/28/15 1631     03/01/15 0000  piperacillin-tazobactam (ZOSYN) IVPB 3.375 g     3.375 g 12.5 mL/hr over 240 Minutes Intravenous Every 8 hours 02/28/15 1631     02/28/15 1530  vancomycin (VANCOCIN) 1,250 mg in sodium chloride 0.9 % 250 mL IVPB     1,250 mg 166.7 mL/hr over 90 Minutes Intravenous  Once 02/28/15 1523 02/28/15 1736   02/28/15 1530  piperacillin-tazobactam (ZOSYN) IVPB 3.375 g     3.375 g 100 mL/hr over 30 Minutes Intravenous  Once 02/28/15 1524 02/28/15 1626       Assessment/Plan   Rectus/abdominal wall abscess and cellulitis 4.2 x 8.1 x 8.5 cm - POD #2 s/p I&D of complex, multi-chambered abdominal wall abscess, debridement of necrotic fascia (Dr. Derrell Lolling) -wound is clean and packed -HH arranged -CX shows microaerophilic strep, which should be sensitive to bactrim as well -patient surgically stable for DC home.  Likely needs a totally of 14 days abx therapy. -follow up with Dr. Derrell Lolling in 2 weeks in the office UTI- -Klebsiella, sensitive to bactrim Anemia Leukocytosis - down to 9.1 -HH diet -Pain control,  antiemetics, antibiotics (vanc and zosyn Day #3) -PT/OT, Ambulate and IS -SCD's and lovenox started -On home meds Chronic normocytic anemia H/o CAD DM Type II AAA s/p repair HTN  LOS: 3 days    Jennifer [name] E 03/03/2015, 11:29 AM Pager: [phone]

## 2015-03-03 NOTE — Progress Notes (Signed)
ANTIBIOTIC CONSULT NOTE - INITIAL  Pharmacy Consult:  Vancomycin / Zosyn  Indication: Cellulitis   No Known Allergies  Patient Measurements: Height: 5\' 3"  (160 cm) Weight: 146 lb (66.225 kg) IBW/kg (Calculated) : 52.4 Actual Body Weight: 59.1kg   Vital Signs: Temp: 98.1 F (36.7 C) (04/01 0930) Temp Source: Oral (04/01 0930) BP: 154/67 mmHg (04/01 1223) Pulse Rate: 63 (04/01 1223) Intake/Output from previous day: 03/31 0701 - 04/01 0700 In: 1540 [P.O.:440; I.V.:800; IV Piggyback:300] Out: 225 [Urine:225] Intake/Output from this shift: Total I/O In: 360 [P.O.:360] Out: -   Labs:  Recent Labs  03/01/15 0359 03/03/15 0415  WBC 11.8* 9.1  HGB 7.5* 8.0*  PLT 320 328  CREATININE 1.03 1.02   Estimated Creatinine Clearance: 35.5 mL/min (by C-G formula based on Cr of 1.02). No results for input(s): VANCOTROUGH, VANCOPEAK, VANCORANDOM, GENTTROUGH, GENTPEAK, GENTRANDOM, TOBRATROUGH, TOBRAPEAK, TOBRARND, AMIKACINPEAK, AMIKACINTROU, AMIKACIN in the last 72 hours.   Microbiology: Recent Results (from the past 720 hour(s))  Urine culture     Status: None   Collection Time: 02/28/15  4:43 PM  Result Value Ref Range Status   Specimen Description URINE, CATHETERIZED  Final   Special Requests PATIENT ON FOLLOWING VANCOMYCIN ZOSYN  Final   Colony Count   Final    >=100,000 COLONIES/ML Performed at [facility]    Culture   Final    KLEBSIELLA PNEUMONIAE Performed at [facility]    Report Status 03/02/2015 FINAL  Final   Organism ID, Bacteria KLEBSIELLA PNEUMONIAE  Final      Susceptibility   Klebsiella pneumoniae - MIC*    AMPICILLIN RESISTANT      CEFAZOLIN <=4 SENSITIVE Sensitive     CEFTRIAXONE <=1 SENSITIVE Sensitive     CIPROFLOXACIN <=0.25 SENSITIVE Sensitive     GENTAMICIN <=1 SENSITIVE Sensitive     LEVOFLOXACIN <=0.12 SENSITIVE Sensitive     NITROFURANTOIN 64 INTERMEDIATE Intermediate     TOBRAMYCIN <=1 SENSITIVE Sensitive     TRIMETH/SULFA  <=20 SENSITIVE Sensitive     PIP/TAZO <=4 SENSITIVE Sensitive     * KLEBSIELLA PNEUMONIAE  Anaerobic culture     Status: None (Preliminary result)   Collection Time: 02/28/15  5:02 PM  Result Value Ref Range Status   Specimen Description ABSCESS ABDOMEN  Final   Special Requests PATIENT ON FOLLOWING VANCOMYCIN, ZOSYN  Final   Gram Stain   Final    ABUNDANT WBC PRESENT,BOTH PMN AND MONONUCLEAR NO SQUAMOUS EPITHELIAL CELLS SEEN ABUNDANT GRAM POSITIVE COCCI IN CHAINS Performed at [facility]    Culture   Final    NO ANAEROBES ISOLATED; CULTURE IN PROGRESS FOR 5 DAYS Performed at [facility]    Report Status PENDING  Incomplete  Culture, routine-abscess     Status: None (Preliminary result)   Collection Time: 02/28/15  5:02 PM  Result Value Ref Range Status   Specimen Description ABSCESS ABDOMEN  Final   Special Requests PATIENT ON FOLLOWING ZOSYN, VANCOMYCIN  Final   Gram Stain   Final    ABUNDANT WBC PRESENT,BOTH PMN AND MONONUCLEAR NO SQUAMOUS EPITHELIAL CELLS SEEN ABUNDANT GRAM POSITIVE COCCI IN CHAINS Performed at [facility]    Culture   Final    FEW MICROAEROPHILIC STREPTOCOCCI Note: Standardized susceptibility testing for this organism is not available. Performed at [facility]    Report Status PENDING  Incomplete      Assessment: [age] YOF with abdominal wall cellulitis and abscess to continue on vancomycin and  Zosyn.  Patient's cultures have resulted.  Her renal function has been stable.  Zosyn 3/29 >> Vanc 3/29 >>  3/29 UCx - Kleb pneumo. sens to all x nitrofurantion 3/29 abscess abd - microaerophilic streptococci 4/1 UCx -    Goal of Therapy:  Vancomycin trough level ~15 mcg/ml   Plan:  - Vanc  IV Q24H - Zosyn 3.375gm IV Q8H, 4 hr infusion - Monitor renal fxn, cultures and clinical progress - Vanc trough soon if abx not yet de-escalated    Maysoon Lozada D. Laney Potash, PharmD, BCPS Pager:  [phone] 03/03/2015, 1:46  PM

## 2015-03-03 NOTE — Evaluation (Signed)
Occupational Therapy Evaluation Patient Details Name: Jennifer MusselJanie B Turri MRN: [id number] DOB: 08/05/1925 Today's Date: 03/03/2015    History of Present Illness Patient is a 81 y/o female POD 2 s/p I&D of complex multi chambered abdominal wall abscess and debridement of necrotic fascia secondary to Rectus/abdominal wall abscess and cellulitis. PMH of DM, HTN and AAA s/p repair.    Clinical Impression   This 81 yo female admitted with above presents to acute OT with decreased balance, increased anxiety, decreased mobility all affecting her ability to perform BADLs at a S level as she was pta, she will benefit from continued OT on acute with follow up at SNF to get back to a S level.    Follow Up Recommendations  SNF;Supervision/Assistance - 24 hour (if refuses SNF then will need HHOT and someone with her anytime she is up on her feet as well as increased A for BADLs)    Equipment Recommendations  3 in 1 bedside comode       Precautions / Restrictions Precautions Precautions: Fall Restrictions Weight Bearing Restrictions: No      Mobility Bed Mobility               General bed mobility comments: Pt up on BSC upon my arrival  Transfers Overall transfer level: Needs assistance Equipment used: Rolling walker (2 wheeled) Transfers: Sit to/from Stand Sit to Stand: Min assist              Balance Overall balance assessment: Needs assistance         Standing balance support: During functional activity Standing balance-Leahy Scale: Poor Standing balance comment: On LOB that I had to help correct                            ADL Overall ADL's : Needs assistance/impaired Eating/Feeding: Independent;Sitting   Grooming: Set up;Sitting   Upper Body Bathing: Set up;Sitting   Lower Body Bathing: Moderate assistance (with min guard A sit<>stand)   Upper Body Dressing : Minimal assistance;Sitting   Lower Body Dressing: Moderate assistance (with min guard A  sit<>stand)   Toilet Transfer: Minimal assistance;Stand-pivot;RW;BSC   Toileting- Clothing Manipulation and Hygiene: Moderate assistance (with min guard A sit<>stand)                         Pertinent Vitals/Pain Pain Assessment: No/denies pain     Hand Dominance Right   Extremity/Trunk Assessment Upper Extremity Assessment Upper Extremity Assessment: Overall WFL for tasks assessed           Communication Communication Communication: No difficulties   Cognition Arousal/Alertness: Awake/alert Behavior During Therapy: Anxious Overall Cognitive Status: No family/caregiver present to determine baseline cognitive functioning                                Home Living Family/patient expects to be discharged to:: Skilled nursing facility Living Arrangements: Spouse/significant other Available Help at Discharge: Family;Available 24 hours/day Type of Home: House Home Access: Stairs to enter Entergy CorporationEntrance Stairs-Number of Steps: 3 Entrance Stairs-Rails: None Home Layout: One level     Bathroom Shower/Tub: Tub/shower unit         Home Equipment: Environmental consultantWalker - 2 wheels          Prior Functioning/Environment Level of Independence: Independent             OT Diagnosis: Generalized  weakness;Cognitive deficits   OT Problem List: Decreased activity tolerance;Impaired balance (sitting and/or standing);Decreased cognition;Decreased knowledge of use of DME or AE   OT Treatment/Interventions: Self-care/ADL training;Patient/family education;Balance training;DME and/or AE instruction;Therapeutic activities    OT Goals(Current goals can be found in he care plan section) Acute Rehab OT Goals Patient Stated Goal: to be able to go home OT Goal Formulation: With patient Time For Goal Achievement: 03/10/15 Potential to Achieve Goals: Good  OT Frequency: Min 2X/week              End of Session Equipment Utilized During Treatment: Gait belt;Rolling  walker  Activity Tolerance:  (limited by anxiousness) Patient left: in chair;with call bell/phone within reach   Time: 0900-0926 OT Time Calculation (min): 26 min Charges:  OT General Charges $OT Visit: 1 Procedure OT Evaluation $Initial OT Evaluation Tier I: 1 Procedure OT Treatments $Self Care/Home Management : 8-22 mins  Evette Georges [phone] 03/03/2015, 9:50 AM

## 2015-03-03 NOTE — Discharge Instructions (Signed)
Dressing Change 2 times a day A dressing is a material placed over wounds. It keeps the wound clean, dry, and protected from further injury. This provides an environment that favors wound healing.  BEFORE YOU BEGIN  Get your supplies together. Things you may need include:  Saline solution.  Flexible gauze dressing.  Medicated cream.  Tape.  Gloves.  Abdominal dressing pads.  Gauze squares.  Plastic bags.  Take pain medicine 30 minutes before the dressing change if you need it.  Take a shower before you do the first dressing change of the day. Use plastic wrap or a plastic bag to prevent the dressing from getting wet. REMOVING YOUR OLD DRESSING   Wash your hands with soap and water. Dry your hands with a clean towel.  Put on your gloves.  Remove any tape.  Carefully remove the old dressing. If the dressing sticks, you may dampen it with warm water to loosen it, or follow your caregiver's specific directions.  Remove any gauze or packing tape that is in your wound.  Take off your gloves.  Put the gloves, tape, gauze, or any packing tape into a plastic bag. CHANGING YOUR DRESSING  Open the supplies.  Take the cap off the saline solution.  Open the gauze package so that the gauze remains on the inside of the package.  Put on your gloves.  Clean your wound as told by your caregiver.  If you have been told to keep your wound dry, follow those instructions.  Your caregiver may tell you to do one or more of the following:  Pick up the gauze. Pour the saline solution over the gauze. Squeeze out the extra saline solution.  Put medicated cream or other medicine on your wound if you have been told to do so.  Put the solution soaked gauze only in your wound, not on the skin around it.  Pack your wound loosely or as told by your caregiver.  Put dry gauze on your wound.  Put abdominal dressing pads over the dry gauze if your wet gauze soaks through.  Tape the  abdominal dressing pads in place so they will not fall off. Do not wrap the tape completely around the affected part (arm, leg, abdomen).  Wrap the dressing pads with a flexible gauze dressing to secure it in place.  Take off your gloves. Put them in the plastic bag with the old dressing. Tie the bag shut and throw it away.  Keep the dressing clean and dry until your next dressing change.  Wash your hands. SEEK MEDICAL CARE IF:  Your skin around the wound looks red.  Your wound feels more tender or sore.  You see pus in the wound.  Your wound smells bad.  You have a fever.  Your skin around the wound has a rash that itches and burns.  You see black or yellow skin in your wound that was not there before.  You feel nauseous, throw up, and feel very tired. Document Released: 12/26/2004 Document Revised: 02/10/2012 Document Reviewed: 09/30/2011 [facility] Patient Information 2015 [employer] [place]. This information is not intended to replace advice given to you by your health care provider. Make sure you discuss any questions you have with your health care provider.

## 2015-03-03 NOTE — Progress Notes (Addendum)
PROGRESS NOTE  Jennifer [name] ZOX:[id number] DOB: 03-Apr-1925 DOA: 02/28/2015 PCP: Kristie Cowman, MD  Brief narrative 81 year old female with history of coronary disease, type 2 diabetes mellitus, hypertension, history of AAA repair, presented to the ED with abdominal pain for almost 1 week. A CT scan in the ED showed a abnormal lower rectus abdominis muscle with low related intermediate density suggestive of cellulitis versus abscess. Admitted to hospitalist service and surgery consulted. Patient underwent I&D of the complex multichambered abdominal wall abscess with debridement of necrotic fascia on 3/29.  Assessment/Plan: Complex abdominal wall abscess Status post I&D with debridement of the necrotic fascia on 3/29.  -Wound Culture growing GPC in chains--microaerophilic strep--likely strept millieri group -d/c Zosyn -start cephalexin and flagyl -d/c vancomycin -Afebrile and leukocytosis improved. -when necessary pain medications. -Appreciate Surgery following.   Klebsiella bacteruria -pt was symptomatic  -plan to d/c on cephalexin -pt had 3 full days of zosyn  Iron deficiency anemia Baseline hemoglobin of 8-9.  -start on iron supplements.  Coronary Artery disease Stable. Continue labetalol and statin  Type 2 diabetes mellitus Last A1c of 6.4. Metformin held. Monitor on sliding scale insulin  Dyslipidemia Continue statin -d/c lopid due to risk of rhabdo with statin -start tricor  Essential hypertension Continue home blood pressure medications.  -Stable and controlled  History of AAA repair Stable  Code Status: Full code Family Communication: pt only today Disposition Plan: possible d/c 4/2    Procedures/Studies: Ct Abdomen Pelvis W Contrast  02/28/2015   CLINICAL DATA:  81 year old female with abdominal pain for 1 week. Cellulitis. Initial encounter.  EXAM: CT ABDOMEN AND PELVIS WITH CONTRAST  TECHNIQUE: Multidetector CT imaging of the abdomen and  pelvis was performed using the standard protocol following bolus administration of intravenous contrast.  CONTRAST:  80 mL Omnipaque 300.  COMPARISON:  Chest abdomen and pelvis CTA 09/29/2014.  FINDINGS: Since 2015, distal thoracic aorta endograft re- identified. It appears the native thoracic aortic aneurysm sac has regressed since that time. Largely resolved bibasilar atelectasis and pleural effusions. Stable cardiomegaly. No pericardial effusion.  Scoliosis and degenerative changes in the spine. No acute osseous abnormality identified.  No pelvic free fluid.  Increased stool in the rectum.  New since the prior study is an abnormal appearance of the inferior rectus abdominal muscles, more so the right. There is also abnormal density in the overlying suprapubic region abdominal wall subcutaneous fat. Lobulated low to intermediate density masslike enlargement of the soft tissues here with peripheral and internal septal type hyper enhancement. The abnormal mass bulges into the ventral peritoneal cavity, and there is inflammation in the space of Retzius, also with 1 or 2 finger type projections directed toward the dome of the bladder (see sagittal image 54). The rectus abnormality encompasses 42 x 81 x 85 mm (AP by transverse by CC). The overlying abnormal subcutaneous fat then encompasses another 68 cm diameter.  There is mild thickening of the anterior dome of the bladder nearest the abnormality. There is a trace amount of gas within the bladder (series 2, image 71). There is a left lateral bladder diverticulum versus simple fluid density left adnexal cyst adjacent to the bladder measuring 2.5 cm on series 2, image 68.  Uterus and adnexa otherwise appear surgically absent. Diverticulosis of the sigmoid colon which does not appear involved in the above abnormality. Diverticulosis continues to the splenic flexure with no associated active inflammation. There is diverticulosis at the hepatic flexure and  involving the  right colon. Appendix not identified. Negative terminal ileum. No dilated or inflamed small bowel loops are identified. Oral contrast has not yet reached the distal small bowel. Negative stomach and duodenum.  No abdominal free fluid or free air. Stable and negative liver, gallbladder (decompressed), spleen, pancreas, and adrenal glands. Both kidneys appear stable with symmetric renal enhancement and contrast excretion.  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 No lymphadenopathy.  IMPRESSION: 1. Severely abnormal lower rectus abdominal muscles right greater than left with lobulated intermediate density and hyperenhancement encompassing 4.2 x 8.1 x 8.5 cm. This appearance is new since October and is favored to reflect superinfected spontaneous rectus muscle hematomas. Intramuscular abscess suspected. Confluent overlying subcutaneous fat inflammation/edema. 2. Secondary inflammation of the space of Retzius and anterior bladder wall. Trace gas within the bladder might be infectious in nature. 3. Improved appearance of the distal thoracic aortic aneurysm status post endograft placement. Improved bibasilar ventilation.   Electronically Signed   By: Odessa Fleming M.D.   On: 02/28/2015 14:16        Subjective: Patient complains of pain at the surgical site. Otherwise, she denies any fevers, chills, chest pain, shortness breath, nausea, vomiting, diarrhea, dysuria, hematuria.  Objective: Filed Vitals:   03/03/15 0517 03/03/15 0930 03/03/15 1223 03/03/15 1500  BP: 146/67 150/60 154/67 107/47  Pulse: 63 63 63 62  Temp: 98 F (36.7 C) 98.1 F (36.7 C)  98.6 F (37 C)  TempSrc: Oral Oral  Oral  Resp: Height:      Weight:      SpO2: 95% 98%  98%    Intake/Output Summary (Last 24 hours) at 03/03/15 1750 Last data filed at 03/03/15 1530  Gross per 24 hour  Intake   1760 ml  Output    225 ml  Net   1535  ml   Weight change:  Exam:   General:  Pt is alert, follows commands appropriately, not in acute distress  HEENT: No icterus, No thrush, [place]/AT  Cardiovascular: RRR, S1/S2, no rubs, no gallops  Respiratory: CTA bilaterally, no wheezing, no crackles, no rhonchi  Abdomen: Soft/+BS,non distended, no guarding-periumbilical wound with good granulation. No cellulitis or necrosis. No purulent drainage.  Extremities: 1+LE edema, No lymphangitis, No petechiae, No rashes, no synovitis  Data Reviewed: Basic Metabolic Panel:  Recent Labs Lab 02/28/15 1240 03/01/15 0359 03/03/15 0415  NA 139 140 141  K 3.5 3.8 4.2  CL 106 108 112  CO2 GLUCOSE 113* 88 100*  BUN CREATININE 1.04 1.03 1.02  CALCIUM 9.6 9.3 9.2   Liver Function Tests: No results for input(s): AST, ALT, ALKPHOS, BILITOT, PROT, ALBUMIN in the last 168 hours. No results for input(s): LIPASE, AMYLASE in the last 168 hours. No results for input(s): AMMONIA in the last 168 hours. CBC:  Recent Labs Lab 02/28/15 1240 03/01/15 0359 03/03/15 0415  WBC 16.1* 11.8* 9.1  NEUTROABS 12.6*  --   --   HGB 8.1* 7.5* 8.0*  HCT 25.6* 23.9* 25.7*  MCV 89.8 90.2 91.5  PLT 351 320 328   Cardiac Enzymes: No results for input(s): CKTOTAL, CKMB, CKMBINDEX, TROPONINI in the last 168 hours. BNP: Invalid input(s): POCBNP CBG:  Recent Labs Lab 03/02/15 1713 03/02/15 2131 03/03/15 0744 03/03/15 1212 03/03/15 1723  GLUCAP 135* 110* 104* 133* 89    Recent Results (from  the past 240 hour(s))  Urine culture     Status: None   Collection Time: 02/28/15  4:43 PM  Result Value Ref Range Status   Specimen Description URINE, CATHETERIZED  Final   Special Requests PATIENT ON FOLLOWING VANCOMYCIN ZOSYN  Final   Colony Count   Final    >=100,000 COLONIES/ML Performed at [facility]    Culture   Final    KLEBSIELLA PNEUMONIAE Performed at [facility]    Report Status 03/02/2015 FINAL  Final     Organism ID, Bacteria KLEBSIELLA PNEUMONIAE  Final      Susceptibility   Klebsiella pneumoniae - MIC*    AMPICILLIN RESISTANT      CEFAZOLIN <=4 SENSITIVE Sensitive     CEFTRIAXONE <=1 SENSITIVE Sensitive     CIPROFLOXACIN <=0.25 SENSITIVE Sensitive     GENTAMICIN <=1 SENSITIVE Sensitive     LEVOFLOXACIN <=0.12 SENSITIVE Sensitive     NITROFURANTOIN 64 INTERMEDIATE Intermediate     TOBRAMYCIN <=1 SENSITIVE Sensitive     TRIMETH/SULFA <=20 SENSITIVE Sensitive     PIP/TAZO <=4 SENSITIVE Sensitive     * KLEBSIELLA PNEUMONIAE  Anaerobic culture     Status: None (Preliminary result)   Collection Time: 02/28/15  5:02 PM  Result Value Ref Range Status   Specimen Description ABSCESS ABDOMEN  Final   Special Requests PATIENT ON FOLLOWING VANCOMYCIN, ZOSYN  Final   Gram Stain   Final    ABUNDANT WBC PRESENT,BOTH PMN AND MONONUCLEAR NO SQUAMOUS EPITHELIAL CELLS SEEN ABUNDANT GRAM POSITIVE COCCI IN CHAINS Performed at [facility]    Culture   Final    NO ANAEROBES ISOLATED; CULTURE IN PROGRESS FOR 5 DAYS Performed at [facility]    Report Status PENDING  Incomplete  Culture, routine-abscess     Status: None (Preliminary result)   Collection Time: 02/28/15  5:02 PM  Result Value Ref Range Status   Specimen Description ABSCESS ABDOMEN  Final   Special Requests PATIENT ON FOLLOWING ZOSYN, VANCOMYCIN  Final   Gram Stain   Final    ABUNDANT WBC PRESENT,BOTH PMN AND MONONUCLEAR NO SQUAMOUS EPITHELIAL CELLS SEEN ABUNDANT GRAM POSITIVE COCCI IN CHAINS Performed at [facility]    Culture   Final    FEW MICROAEROPHILIC STREPTOCOCCI Note: Standardized susceptibility testing for this organism is not available. Performed at [facility]    Report Status PENDING  Incomplete     Scheduled Meds: . atorvastatin  20 mg Oral q1800  . calcium carbonate  1,250 mg Oral BID WC  . docusate sodium  100 mg Oral Daily  . enoxaparin (LOVENOX) injection  30 mg  Subcutaneous Q24H  . felodipine  10 mg Oral Daily  . [START ON 03/04/2015] fenofibrate  160 mg Oral Daily  . ferrous sulfate  325 mg Oral BID WC  . insulin aspart  0-9 Units Subcutaneous TID WC & HS  . labetalol  400 mg Oral TID  . phenazopyridine  100 mg Oral TID WC  . piperacillin-tazobactam (ZOSYN)  IV  3.375 g Intravenous Q8H  . polyethylene glycol  17 g Oral Daily  . vancomycin  750 mg Intravenous Q24H  . [START ON 03/06/2015] Vitamin D (Ergocalciferol)  50,000 Units Oral Q7 days   Continuous Infusions: . 0.9 % NaCl with KCl 20 mEq / L 50 mL/hr at 03/03/15 0004     Elon Lomeli, DO  [facility] Pager [phone]  If 7PM-7AM, please contact night-coverage [email or web address] Password [email or web address]  03/03/2015, 5:50 PM   LOS: 3 days

## 2015-03-04 LAB — URINE CULTURE
COLONY COUNT: NO GROWTH
Culture: NO GROWTH

## 2015-03-04 LAB — CBC
HCT: 25.3 % — ABNORMAL LOW (ref 36.0–46.0)
Hemoglobin: 8 g/dL — ABNORMAL LOW (ref 12.0–15.0)
MCH: 29 pg (ref 26.0–34.0)
MCHC: 31.6 g/dL (ref 30.0–36.0)
MCV: 91.7 fL (ref 78.0–100.0)
Platelets: 328 10*3/uL (ref 150–400)
RBC: 2.76 MIL/uL — AB (ref 3.87–5.11)
RDW: 15.9 % — ABNORMAL HIGH (ref 11.5–15.5)
WBC: 9.3 10*3/uL (ref 4.0–10.5)

## 2015-03-04 LAB — CULTURE, ROUTINE-ABSCESS

## 2015-03-04 LAB — BASIC METABOLIC PANEL
Anion gap: 5 (ref 5–15)
BUN: 7 mg/dL (ref 6–23)
CALCIUM: 9.1 mg/dL (ref 8.4–10.5)
CHLORIDE: 111 mmol/L (ref 96–112)
CO2: 25 mmol/L (ref 19–32)
Creatinine, Ser: 0.95 mg/dL (ref 0.50–1.10)
GFR calc Af Amer: 61 mL/min — ABNORMAL LOW (ref >90)
GFR, EST NON AFRICAN AMERICAN: 52 mL/min — AB (ref >90)
GLUCOSE: 93 mg/dL (ref 70–99)
POTASSIUM: 3.7 mmol/L (ref 3.5–5.1)
SODIUM: 141 mmol/L (ref 135–145)

## 2015-03-04 LAB — GLUCOSE, CAPILLARY
GLUCOSE-CAPILLARY: 94 mg/dL (ref 70–99)
Glucose-Capillary: 122 mg/dL — ABNORMAL HIGH (ref 70–99)

## 2015-03-04 MED ORDER — METRONIDAZOLE 500 MG PO TABS: 500.0000 mg | ORAL_TABLET | Freq: Three times a day (TID) | ORAL | Status: DC

## 2015-03-04 MED ORDER — FENOFIBRATE 160 MG PO TABS: 160.0000 mg | ORAL_TABLET | Freq: Every day | ORAL | Status: DC

## 2015-03-04 MED ORDER — FERROUS SULFATE 325 (65 FE) MG PO TABS: 325.0000 mg | ORAL_TABLET | Freq: Two times a day (BID) | ORAL | Status: DC

## 2015-03-04 MED ORDER — CEPHALEXIN 500 MG PO CAPS: 500.0000 mg | ORAL_CAPSULE | Freq: Three times a day (TID) | ORAL | Status: DC

## 2015-03-04 MED ORDER — HYDROCODONE-ACETAMINOPHEN 5-325 MG PO TABS: 1.0000 | ORAL_TABLET | ORAL | Status: DC | PRN

## 2015-03-04 NOTE — Progress Notes (Signed)
Spoke with pt about any needs at home [facility](BSC?) she states that her bathroom is very near to her bed and [facility]  She did not want [facility] at this time.  Called Jennifer JakschSarah [name], Case Manager, about DC home today, she will make sure [facility] is aware of discharge for therapies, PT/OT, HHRN and aide.  Dr. Arbutus Leasat reviewed with pt while we were in room their recommendation for SNF but pt states she is not going to SNF.

## 2015-03-04 NOTE — Progress Notes (Signed)
Spoke with son, Jennifer [name] about pt's discharge to home.  He states he has obtained a hand held shower and shower chair and that her bathroom is about 3 steps from the bed.  Reviewed wound care with son and he states that husband will be able to learn how to do one of the twice a day dressing changes at home.

## 2015-03-04 NOTE — Progress Notes (Signed)
Pt ready for discharge.  DC instructions reviewed and copy given.  rx given for Vicodin and explained.  FU appts all reviewed.  Husband here and ready to take pt home.

## 2015-03-04 NOTE — Progress Notes (Signed)
4 Days Post-Op  Subjective: Doing better. Tolerating diet. Having bowel movements. Ambulatory. Still with urinary frequency but less incontinence. She wants to go home today Home health nursing is arranged for twice a day dressing changes  Objective: Vital signs in last 24 hours: Temp:  [97.8 F (36.6 C)-98.6 F (37 C)] 98.1 F (36.7 C) (04/02 0500) Pulse Rate:  [62-76] 76 (04/02 0500) Resp:  [16] 16 (04/02 0500) BP: (107-154)/(47-72) 138/72 mmHg (04/02 0500) SpO2:  [93 %-98 %] 94 % (04/02 0500) Weight:  [65.635 kg (144 lb 11.2 oz)] 65.635 kg (144 lb 11.2 oz) (04/02 0500) Last BM Date: 03/03/15  Intake/Output from previous day: 04/01 0701 - 04/02 0700 In: 1650 [P.O.:1200; I.V.:450] Out: 200 [Urine:200] Intake/Output this shift:    General appearance: Alert. In no distress. Still very talkative but much less agitated. No distress. Abdomen: Soft and nontender. Midline wound repacked. It is cleaning up and beginning to granulate. No odor or purulence.  Lab Results:  Results for orders placed or performed during the hospital encounter of 02/28/15 (from the past 24 hour(s))  Glucose, capillary     Status: Abnormal   Collection Time: 03/03/15 12:12 PM  Result Value Ref Range   Glucose-Capillary 133 (H) 70 - 99 mg/dL  Glucose, capillary     Status: None   Collection Time: 03/03/15  5:23 PM  Result Value Ref Range   Glucose-Capillary 89 70 - 99 mg/dL  Glucose, capillary     Status: Abnormal   Collection Time: 03/03/15  9:30 PM  Result Value Ref Range   Glucose-Capillary 102 (H) 70 - 99 mg/dL  Basic metabolic panel     Status: Abnormal   Collection Time: 03/04/15  3:45 AM  Result Value Ref Range   Sodium 141 135 - 145 mmol/L   Potassium 3.7 3.5 - 5.1 mmol/L   Chloride 111 96 - 112 mmol/L   CO2 25 19 - 32 mmol/L   Glucose, Bld 93 70 - 99 mg/dL   BUN 7 6 - 23 mg/dL   Creatinine, Ser 1.610.95 0.50 - 1.10 mg/dL   Calcium 9.1 8.4 - 09.610.5 mg/dL   GFR calc non Af Amer 52 (L) >90  mL/min   GFR calc Af Amer 61 (L) >90 mL/min   Anion gap 5 5 - 15  CBC     Status: Abnormal   Collection Time: 03/04/15  3:45 AM  Result Value Ref Range   WBC 9.3 4.0 - 10.5 K/uL   RBC 2.76 (L) 3.87 - 5.11 MIL/uL   Hemoglobin 8.0 (L) 12.0 - 15.0 g/dL   HCT 04.525.3 (L) 40.936.0 - 81.146.0 %   MCV 91.7 78.0 - 100.0 fL   MCH 29.0 26.0 - 34.0 pg   MCHC 31.6 30.0 - 36.0 g/dL   RDW 91.415.9 (H) 78.211.5 - 95.615.5 %   Platelets 328 150 - 400 K/uL     Studies/Results: No results found.  Marland Kitchen. atorvastatin  20 mg Oral q1800  . calcium carbonate  1,250 mg Oral BID WC  . cephALEXin  500 mg Oral 3 times per day  . docusate sodium  100 mg Oral Daily  . enoxaparin (LOVENOX) injection  30 mg Subcutaneous Q24H  . felodipine  10 mg Oral Daily  . fenofibrate  160 mg Oral Daily  . ferrous sulfate  325 mg Oral BID WC  . insulin aspart  0-9 Units Subcutaneous TID WC & HS  . labetalol  400 mg Oral TID  . metroNIDAZOLE  500  mg Oral 3 times per day  . phenazopyridine  100 mg Oral TID WC  . polyethylene glycol  17 g Oral Daily  . [START ON 03/06/2015] Vitamin D (Ergocalciferol)  50,000 Units Oral Q7 days     Assessment/Plan: s/p Procedure(s): INCISION AND DRAINAGE ABSCESS ABDOMINAL WALL  Rectus/abdominal wall abscess and cellulitis 4.2 x 8.1 x 8.5 cm - POD #4 s/p I&D of complex, multi-chambered abdominal wall abscess, debridement of necrotic fascia (Dr. Derrell Lolling) -wound is clean and packed -HH arranged -patient surgically stable for DC home. Likely needs a totally of 7 days abx therapy. -follow up with Dr. Derrell Lolling in 2 weeks in the office I have advised her to follow-up with her PCP, Dr. Bosie Clos, in the next week or 2 for medical evaluation I have advised her to reschedule her appointment with her urologist, Dr. Gaynelle Arabian, because of chronic bladder problems. Discharge per [facility].  UTI- -Klebsiella, sensitive to bactrim, cephalosporins, quinolones.  Anemia Stable Rec. Discharge on iron   Chronic  normocytic anemia  See above H/o CAD DM Type II AAA s/p repair HTN   @  LOS: 4 days    Aleisha Paone M 03/04/2015  . .prob

## 2015-03-04 NOTE — Discharge Summary (Signed)
Physician Discharge Summary  Jennifer [name] ZOX:[id number] DOB: 01/15/1925 DOA: 02/28/2015  PCP: Kristie Cowman, MD  Admit date: 02/28/2015 Discharge date: 03/04/2015  Recommendations for Outpatient Follow-up:  1. Pt will need to follow up with PCP in 2 weeks post discharge Please obtain BMP and CBC in 1-2 weeks Discharge Diagnoses:  Complex abdominal wall abscess -Status post I&D with debridement of the necrotic fascia on 3/29--Dr. Derrell Lolling.  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 -when necessary pain medications. -Appreciate Surgery following.   Klebsiella bacteruria -pt was symptomatic  -plan to d/c on cephalexin -pt had 3 full days of zosyn  Iron deficiency anemia Baseline hemoglobin of 8-9.  -start on iron supplements--home with ferrous sulfate  bid  Coronary Artery disease Stable. Continue labetalol and statin  Type 2 diabetes mellitus Last A1c of 6.4. Metformin held. Monitor on sliding scale insulin  Dyslipidemia Continue statin -d/c lopid due to risk of rhabdo with statin -start tricor  Essential hypertension Continue home blood pressure medications.  -Stable and controlled  History of AAA repair Stable  Deconditioning -PT recommended SNF -Pt refused SNF, so HH was set up for PT and wound care  Code Status: Full code   Discharge Condition: stable  Disposition: home Follow-up Information    Follow up with Ernestene Mention, MD. Schedule an appointment as soon as possible for a visit in 2 weeks.   Specialty:  General Surgery   Contact information:   [address] [place] [address] [phone]       Follow up with Ernestene Mention, MD. Schedule an appointment as soon as possible for a visit in 2 weeks.   Specialty:  General  Surgery   Contact information:   [address] [place] [address] [phone]       Follow up with [facility].   Why:  home health physical and occupational therapy, social worker and aide   Contact information:   [address] [place] [address] [phone]       Follow up with Kristie Cowman, MD In 1 week.   Specialty:  Internal Medicine   Contact information:   [address] [place] [address] [phone]       Diet:regular Wt Readings from Last 3 Encounters:  03/04/15 65.635 kg (144 lb 11.2 oz)  02/14/15 59.149 kg (130 lb 6.4 oz)  11/04/14 60.238 kg (132 lb 12.8 oz)    History of present illness:  81 year old female with history of coronary disease, type 2 diabetes mellitus, hypertension, history of AAA repair, presented to the ED with abdominal pain for almost 1 week. A CT scan in the ED showed a abnormal lower rectus abdominis muscle with low related intermediate density suggestive of cellulitis versus abscess. Admitted to hospitalist service and surgery consulted. Patient underwent I&D of the complex multichambered abdominal wall abscess with debridement of necrotic fascia on 3/29.  Consultants: General surgery--Dr. Derrell Lolling  Discharge Exam: Filed Vitals:   03/04/15 0500  BP: 138/72  Pulse: 76  Temp: 98.1 F (36.7 C)  Resp: 16   Filed Vitals:   03/03/15 1500 03/03/15 2100 03/03/15 2137 03/04/15 0500  BP: 107/47 144/69 144/69 138/72  Pulse: 62 71 71 76  Temp: 98.6 F (37 C) 97.8 F (36.6 C)  98.1 F (36.7 C)  TempSrc: Oral Oral  Oral  Resp: 16 16  16   Height:      Weight:    65.635 kg (144 lb 11.2 oz)  SpO2: 98% 93%  94%   General: A&O x 3, NAD, pleasant, cooperative Cardiovascular: RRR, no rub, no gallop, no S3 Respiratory: CTAB, no wheeze, no rhonchi Abdomen:soft, nontender, nondistended, positive bowel sounds; abd wound with good granulation without lymphangitis or necrosis Extremities: No  edema, No lymphangitis, no petechiae  Discharge Instructions      Discharge Instructions    Diet - low sodium heart healthy    Complete by:  As directed      Increase activity slowly    Complete by:  As directed             Medication List    STOP taking these medications        bisacodyl 5 MG EC tablet  Commonly known as:  DULCOLAX     DSS 100 MG Caps     gemfibrozil 600 MG tablet  Commonly known as:  LOPID     naproxen sodium 220 MG tablet  Commonly known as:  ANAPROX      TAKE these medications        atorvastatin 20 MG tablet  Commonly known as:  LIPITOR  Take 1 tablet (20 mg total) by mouth daily at 6 PM.     calcium carbonate 600 MG Tabs tablet  Commonly known as:  OS-CAL  Take 600 mg by mouth 2 (two) times daily with a meal.     cephALEXin 500 MG capsule  Commonly known as:  KEFLEX  Take 1 capsule (500 mg total) by mouth every 8 (eight) hours.     felodipine 10 MG 24 hr tablet  Commonly known as:  PLENDIL  Take 10 mg by mouth daily.     fenofibrate 160 MG tablet  Take 1 tablet (160 mg total) by mouth daily.     ferrous sulfate 325 (65 FE) MG tablet  Take 1 tablet (325 mg total) by mouth 2 (two) times daily with a meal.     HYDROcodone-acetaminophen 5-325 MG per tablet  Commonly known as:  NORCO/VICODIN  Take 1-2 tablets by mouth every 4 (four) hours as needed for moderate pain.     labetalol 200 MG tablet  Commonly known as:  NORMODYNE  Take 2 tablets (400 mg total) by mouth 3 (three) times daily.     metFORMIN 500 MG tablet  Commonly known as:  GLUCOPHAGE  Take 500 mg by mouth 2 (two) times daily with a meal.     metroNIDAZOLE 500 MG tablet  Commonly known as:  FLAGYL  Take 1 tablet (500 mg total) by mouth every 8 (eight) hours.     SYSTANE OP  Place 1 drop into both eyes 2 (two) times daily.     Vitamin D (Ergocalciferol) 50000 UNITS Caps capsule  Commonly known as:  DRISDOL  Take 50,000 Units by mouth every 7 (seven) days. Every  saturday         The results of significant diagnostics from this hospitalization (including imaging, microbiology, ancillary and laboratory) are listed below for reference.    Significant Diagnostic Studies: Ct Abdomen Pelvis W Contrast  02/28/2015   CLINICAL DATA:  81 year old female with abdominal pain for 1 week. Cellulitis. Initial encounter.  EXAM: CT ABDOMEN AND PELVIS WITH CONTRAST  TECHNIQUE: Multidetector CT imaging of the abdomen and pelvis was performed using the standard protocol following  bolus administration of intravenous contrast.  CONTRAST:  80 mL Omnipaque 300.  COMPARISON:  Chest abdomen and pelvis CTA 09/29/2014.  FINDINGS: Since 2015, distal thoracic aorta endograft re- identified. It appears the native thoracic aortic aneurysm sac has regressed since that time. Largely resolved bibasilar atelectasis and pleural effusions. Stable cardiomegaly. No pericardial effusion.  Scoliosis and degenerative changes in the spine. No acute osseous abnormality identified.  No pelvic free fluid.  Increased stool in the rectum.  New since the prior study is an abnormal appearance of the inferior rectus abdominal muscles, more so the right. There is also abnormal density in the overlying suprapubic region abdominal wall subcutaneous fat. Lobulated low to intermediate density masslike enlargement of the soft tissues here with peripheral and internal septal type hyper enhancement. The abnormal mass bulges into the ventral peritoneal cavity, and there is inflammation in the space of Retzius, also with 1 or 2 finger type projections directed toward the dome of the bladder (see sagittal image 54). The rectus abnormality encompasses 42 x 81 x 85 mm (AP by transverse by CC). The overlying abnormal subcutaneous fat then encompasses another 68 cm diameter.  There is mild thickening of the anterior dome of the bladder nearest the abnormality. There is a trace amount of gas within the bladder (series 2, image  71). There is a left lateral bladder diverticulum versus simple fluid density left adnexal cyst adjacent to the bladder measuring 2.5 cm on series 2, image 68.  Uterus and adnexa otherwise appear surgically absent. Diverticulosis of the sigmoid colon which does not appear involved in the above abnormality. Diverticulosis continues to the splenic flexure with no associated active inflammation. There is diverticulosis at the hepatic flexure and involving the right colon. Appendix not identified. Negative terminal ileum. No dilated or inflamed small bowel loops are identified. Oral contrast has not yet reached the distal small bowel. Negative stomach and duodenum.  No abdominal free fluid or free air. Stable and negative liver, gallbladder (decompressed), spleen, pancreas, and adrenal glands. Both kidneys appear stable with symmetric renal enhancement and contrast excretion.  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 No lymphadenopathy.  IMPRESSION: 1. Severely abnormal lower rectus abdominal muscles right greater than left with lobulated intermediate density and hyperenhancement encompassing 4.2 x 8.1 x 8.5 cm. This appearance is new since October and is favored to reflect superinfected spontaneous rectus muscle hematomas. Intramuscular abscess suspected. Confluent overlying subcutaneous fat inflammation/edema. 2. Secondary inflammation of the space of Retzius and anterior bladder wall. Trace gas within the bladder might be infectious in nature. 3. Improved appearance of the distal thoracic aortic aneurysm status post endograft placement. Improved bibasilar ventilation.   Electronically Signed   By: Odessa Fleming M.D.   On: 02/28/2015 14:16     Microbiology: Recent Results (from the past 240 hour(s))  Urine culture     Status: None   Collection Time: 02/28/15  4:43 PM  Result Value Ref Range Status   Specimen Description  URINE, CATHETERIZED  Final   Special Requests PATIENT ON FOLLOWING VANCOMYCIN ZOSYN  Final   Colony Count   Final    >=100,000 COLONIES/ML Performed at [facility]    Culture   Final    KLEBSIELLA PNEUMONIAE Performed at [facility]    Report Status 03/02/2015 FINAL  Final   Organism ID, Bacteria KLEBSIELLA PNEUMONIAE  Final      Susceptibility   Klebsiella  pneumoniae - MIC*    AMPICILLIN RESISTANT      CEFAZOLIN <=4 SENSITIVE Sensitive     CEFTRIAXONE <=1 SENSITIVE Sensitive     CIPROFLOXACIN <=0.25 SENSITIVE Sensitive     GENTAMICIN <=1 SENSITIVE Sensitive     LEVOFLOXACIN <=0.12 SENSITIVE Sensitive     NITROFURANTOIN 64 INTERMEDIATE Intermediate     TOBRAMYCIN <=1 SENSITIVE Sensitive     TRIMETH/SULFA <=20 SENSITIVE Sensitive     PIP/TAZO <=4 SENSITIVE Sensitive     * KLEBSIELLA PNEUMONIAE  Anaerobic culture     Status: None (Preliminary result)   Collection Time: 02/28/15  5:02 PM  Result Value Ref Range Status   Specimen Description ABSCESS ABDOMEN  Final   Special Requests PATIENT ON FOLLOWING VANCOMYCIN, ZOSYN  Final   Gram Stain   Final    ABUNDANT WBC PRESENT,BOTH PMN AND MONONUCLEAR NO SQUAMOUS EPITHELIAL CELLS SEEN ABUNDANT GRAM POSITIVE COCCI IN CHAINS Performed at [facility]    Culture   Final    NO ANAEROBES ISOLATED; CULTURE IN PROGRESS FOR 5 DAYS Performed at [facility]    Report Status PENDING  Incomplete  Culture, routine-abscess     Status: None   Collection Time: 02/28/15  5:02 PM  Result Value Ref Range Status   Specimen Description ABSCESS ABDOMEN  Final   Special Requests PATIENT ON FOLLOWING ZOSYN, VANCOMYCIN  Final   Gram Stain   Final    ABUNDANT WBC PRESENT,BOTH PMN AND MONONUCLEAR NO SQUAMOUS EPITHELIAL CELLS SEEN ABUNDANT GRAM POSITIVE COCCI IN CHAINS Performed at [facility]    Culture   Final    FEW MICROAEROPHILIC STREPTOCOCCI Note: Standardized susceptibility testing for this  organism is not available. Performed at [facility]    Report Status 03/04/2015 FINAL  Final  Urine culture     Status: None   Collection Time: 03/03/15  2:00 AM  Result Value Ref Range Status   Specimen Description URINE, CLEAN CATCH  Final   Special Requests NONE  Final   Colony Count NO GROWTH Performed at [facility]   Final   Culture NO GROWTH Performed at [facility]   Final   Report Status 03/04/2015 FINAL  Final     Labs: Basic Metabolic Panel:  Recent Labs Lab 02/28/15 1240 03/01/15 0359 03/03/15 0415 03/04/15 0345  NA 139 140 141 141  K 3.5 3.8 4.2 3.7  CL 106 108 112 111  CO2 24 26 24 25   GLUCOSE 113* 88 100* 93  BUN 18 12 10 7   CREATININE 1.04 1.03 1.02 0.95  CALCIUM 9.6 9.3 9.2 9.1   Liver Function Tests: No results for input(s): AST, ALT, ALKPHOS, BILITOT, PROT, ALBUMIN in the last 168 hours. No results for input(s): LIPASE, AMYLASE in the last 168 hours. No results for input(s): AMMONIA in the last 168 hours. CBC:  Recent Labs Lab 02/28/15 1240 03/01/15 0359 03/03/15 0415 03/04/15 0345  WBC 16.1* 11.8* 9.1 9.3  NEUTROABS 12.6*  --   --   --   HGB 8.1* 7.5* 8.0* 8.0*  HCT 25.6* 23.9* 25.7* 25.3*  MCV 89.8 90.2 91.5 91.7  PLT 351 320 328 328   Cardiac Enzymes: No results for input(s): CKTOTAL, CKMB, CKMBINDEX, TROPONINI in the last 168 hours. BNP: Invalid input(s): POCBNP CBG:  Recent Labs Lab 03/03/15 1212 03/03/15 1723 03/03/15 2130 03/04/15 0826 03/04/15 1157  GLUCAP 133* 89 102* 94 122*    Time coordinating discharge:  Greater than 30 minutes  Signed:  Deontrae Drinkard, DO [facility] Pager: [phone] 03/04/2015, 12:14 PM

## 2015-03-04 NOTE — Progress Notes (Signed)
Occupational Therapy Treatment Patient Details Name: Jennifer [name] MRN: [id number] DOB: [birth year] Today's Date: 03/04/2015    History of present illness Patient is a 81 y/o female POD 2 s/p I&D of complex multi chambered abdominal wall abscess and debridement of necrotic fascia secondary to Rectus/abdominal wall abscess and cellulitis. PMH of DM, HTN and AAA s/p repair.    OT comments  Pt requiring min to min guard assist for transfers with RW.  Eager to go home.   Follow Up Recommendations  Home health OT;Supervision/Assistance - 24 hour (close supervision)    Equipment Recommendations       Recommendations for Other Services      Precautions / Restrictions Precautions Precautions: Fall Precaution Comments: abdominal wound       Mobility Bed Mobility               General bed mobility comments: pt up in chair, returned to chair  Transfers Overall transfer level: Needs assistance Equipment used: Rolling walker (2 wheeled) Transfers: Sit to/from [employer] Sit to Stand: Min assist;Min guard Stand pivot transfers: Min guard       General transfer comment: unsteady, assisted to rise from chair, but able to stand from 3 in 1 with min guard assist    Balance                                   ADL Overall ADL's : Needs assistance/impaired     Grooming: Set up;Sitting;Brushing hair;Wash/dry hands                   Toilet Transfer: Stand-pivot;RW;BSC;Min guard   Toileting- Clothing Manipulation and Hygiene: Minimal assistance;Sit to/from stand         General ADL Comments: Pt declined ambulation to bathroom rather than use of 3 in 1.      Vision                     Perception     Praxis      Cognition   Behavior During Therapy: Anxious Overall Cognitive Status: No family/caregiver present to determine baseline cognitive functioning                       Extremity/Trunk Assessment               Exercises     Shoulder Instructions       General Comments      Pertinent Vitals/ Pain       Pain Assessment: Faces Faces Pain Scale: Hurts a little bit Pain Location: abdominal incision Pain Descriptors / Indicators: Burning Pain Intervention(s): Monitored during session;Repositioned  Home Living                                          Prior Functioning/Environment              Frequency Min 2X/week     Progress Toward Goals  OT Goals(current goals can now be found in the care plan section)  Progress towards OT goals: Progressing toward goals  Acute Rehab OT Goals Patient Stated Goal: to be able to go home  Plan Discharge plan needs to be updated    Co-evaluation  End of Session Equipment Utilized During Treatment: Gait belt;Rolling walker   Activity Tolerance Patient tolerated treatment well (self limiting)   Patient Left in chair;with call bell/phone within reach   Nurse Communication          Time: [id number] OT Time Calculation (min): 12 min  Charges: OT General Charges $OT Visit: 1 Procedure OT Treatments $Self Care/Home Management : 8-22 mins  Evern BioMayberry, Susane Bey Lynn 03/04/2015, 1:51 PM

## 2015-03-05 LAB — ANAEROBIC CULTURE

## 2015-03-29 ENCOUNTER — Other Ambulatory Visit: Payer: Self-pay | Admitting: Physician Assistant

## 2015-03-30 NOTE — Telephone Encounter (Signed)
This was previously refilled by Dr Tat. Ok to refill under Dr Eldridge DaceVaranasi? Please advise. Thanks, MI

## 2015-03-30 NOTE — Telephone Encounter (Signed)
Dr. Eldridge DaceVaranasi-  You seen this pt 02/14/15 and it looks like this pt was not on this medication at that time. Pt was discharged from the hospital on 03/04/15 and the Fenofibrate was in the discharge med list. Are you ok with refilling this or does it need to be filled by Dr. Arbutus Leasat?

## 2015-03-31 ENCOUNTER — Encounter (HOSPITAL_BASED_OUTPATIENT_CLINIC_OR_DEPARTMENT_OTHER): Payer: Self-pay

## 2015-03-31 ENCOUNTER — Inpatient Hospital Stay (HOSPITAL_BASED_OUTPATIENT_CLINIC_OR_DEPARTMENT_OTHER)
Admission: EM | Admit: 2015-03-31 | Discharge: 2015-04-04 | DRG: 291 | Disposition: A | Discharge status: Home / Self Care | Payer: Medicare Other | Attending: Family Medicine | Admitting: Family Medicine

## 2015-03-31 ENCOUNTER — Other Ambulatory Visit (HOSPITAL_BASED_OUTPATIENT_CLINIC_OR_DEPARTMENT_OTHER): Payer: Self-pay

## 2015-03-31 ENCOUNTER — Emergency Department (HOSPITAL_BASED_OUTPATIENT_CLINIC_OR_DEPARTMENT_OTHER): Payer: Medicare Other

## 2015-03-31 DIAGNOSIS — J9601 Acute respiratory failure with hypoxia: Secondary | ICD-10-CM | POA: Diagnosis present

## 2015-03-31 DIAGNOSIS — Z79899 Other long term (current) drug therapy: Secondary | ICD-10-CM

## 2015-03-31 DIAGNOSIS — N183 Chronic kidney disease, stage 3 unspecified: Secondary | ICD-10-CM | POA: Diagnosis present

## 2015-03-31 DIAGNOSIS — I129 Hypertensive chronic kidney disease with stage 1 through stage 4 chronic kidney disease, or unspecified chronic kidney disease: Secondary | ICD-10-CM | POA: Diagnosis present

## 2015-03-31 DIAGNOSIS — J80 Acute respiratory distress syndrome: Secondary | ICD-10-CM

## 2015-03-31 DIAGNOSIS — K651 Peritoneal abscess: Secondary | ICD-10-CM | POA: Diagnosis not present

## 2015-03-31 DIAGNOSIS — I509 Heart failure, unspecified: Secondary | ICD-10-CM | POA: Diagnosis not present

## 2015-03-31 DIAGNOSIS — R0603 Acute respiratory distress: Secondary | ICD-10-CM | POA: Diagnosis present

## 2015-03-31 DIAGNOSIS — R0600 Dyspnea, unspecified: Secondary | ICD-10-CM | POA: Diagnosis present

## 2015-03-31 DIAGNOSIS — I160 Hypertensive urgency: Secondary | ICD-10-CM | POA: Diagnosis present

## 2015-03-31 DIAGNOSIS — E119 Type 2 diabetes mellitus without complications: Secondary | ICD-10-CM

## 2015-03-31 DIAGNOSIS — E876 Hypokalemia: Secondary | ICD-10-CM | POA: Diagnosis present

## 2015-03-31 DIAGNOSIS — Z8249 Family history of ischemic heart disease and other diseases of the circulatory system: Secondary | ICD-10-CM

## 2015-03-31 DIAGNOSIS — D509 Iron deficiency anemia, unspecified: Secondary | ICD-10-CM | POA: Diagnosis present

## 2015-03-31 DIAGNOSIS — I10 Essential (primary) hypertension: Secondary | ICD-10-CM

## 2015-03-31 DIAGNOSIS — IMO0002 Reserved for concepts with insufficient information to code with codable children: Secondary | ICD-10-CM | POA: Diagnosis present

## 2015-03-31 DIAGNOSIS — I5033 Acute on chronic diastolic (congestive) heart failure: Secondary | ICD-10-CM | POA: Diagnosis not present

## 2015-03-31 HISTORY — DX: Reserved for inherently not codable concepts without codable children: IMO0001

## 2015-03-31 HISTORY — DX: Gastro-esophageal reflux disease without esophagitis: K21.9

## 2015-03-31 LAB — CBC
HEMATOCRIT: 27.8 % — AB (ref 36.0–46.0)
HEMOGLOBIN: 8.5 g/dL — AB (ref 12.0–15.0)
MCH: 28.3 pg (ref 26.0–34.0)
MCHC: 30.6 g/dL (ref 30.0–36.0)
MCV: 92.7 fL (ref 78.0–100.0)
PLATELETS: 256 10*3/uL (ref 150–400)
RBC: 3 MIL/uL — ABNORMAL LOW (ref 3.87–5.11)
RDW: 17.2 % — AB (ref 11.5–15.5)
WBC: 6.5 10*3/uL (ref 4.0–10.5)

## 2015-03-31 LAB — COMPREHENSIVE METABOLIC PANEL
ALT: 11 U/L (ref 0–35)
ANION GAP: 5 (ref 5–15)
AST: 14 U/L (ref 0–37)
Albumin: 3.5 g/dL (ref 3.5–5.2)
Alkaline Phosphatase: 43 U/L (ref 39–117)
BUN: 21 mg/dL (ref 6–23)
CHLORIDE: 109 mmol/L (ref 96–112)
CO2: 24 mmol/L (ref 19–32)
CREATININE: 1.44 mg/dL — AB (ref 0.50–1.10)
Calcium: 9.3 mg/dL (ref 8.4–10.5)
GFR calc non Af Amer: 32 mL/min — ABNORMAL LOW (ref >90)
GFR, EST AFRICAN AMERICAN: 37 mL/min — AB (ref >90)
GLUCOSE: 124 mg/dL — AB (ref 70–99)
POTASSIUM: 4.3 mmol/L (ref 3.5–5.1)
Sodium: 138 mmol/L (ref 135–145)
TOTAL PROTEIN: 7 g/dL (ref 6.0–8.3)
Total Bilirubin: 0.4 mg/dL (ref 0.3–1.2)

## 2015-03-31 LAB — CBC WITH DIFFERENTIAL/PLATELET
BASOS PCT: 1 % (ref 0–1)
Basophils Absolute: 0 10*3/uL (ref 0.0–0.1)
EOS ABS: 0.1 10*3/uL (ref 0.0–0.7)
Eosinophils Relative: 2 % (ref 0–5)
HEMATOCRIT: 29.9 % — AB (ref 36.0–46.0)
HEMOGLOBIN: 9.1 g/dL — AB (ref 12.0–15.0)
LYMPHS ABS: 1.6 10*3/uL (ref 0.7–4.0)
LYMPHS PCT: 23 % (ref 12–46)
MCH: 29.3 pg (ref 26.0–34.0)
MCHC: 30.4 g/dL (ref 30.0–36.0)
MCV: 96.1 fL (ref 78.0–100.0)
MONO ABS: 0.5 10*3/uL (ref 0.1–1.0)
Monocytes Relative: 7 % (ref 3–12)
NEUTROS PCT: 67 % (ref 43–77)
Neutro Abs: 4.8 10*3/uL (ref 1.7–7.7)
PLATELETS: 258 10*3/uL (ref 150–400)
RBC: 3.11 MIL/uL — ABNORMAL LOW (ref 3.87–5.11)
RDW: 17.2 % — AB (ref 11.5–15.5)
WBC: 7 10*3/uL (ref 4.0–10.5)

## 2015-03-31 LAB — TROPONIN I: Troponin I: <0.03 ng/mL (ref <0.031)

## 2015-03-31 LAB — BRAIN NATRIURETIC PEPTIDE: B Natriuretic Peptide: 915.6 pg/mL — ABNORMAL HIGH (ref 0.0–100.0)

## 2015-03-31 LAB — PHOSPHORUS: PHOSPHORUS: 4.2 mg/dL (ref 2.3–4.6)

## 2015-03-31 LAB — CREATININE, SERUM
Creatinine, Ser: 1.56 mg/dL — ABNORMAL HIGH (ref 0.50–1.10)
GFR calc non Af Amer: 29 mL/min — ABNORMAL LOW (ref >90)
GFR, EST AFRICAN AMERICAN: 33 mL/min — AB (ref >90)

## 2015-03-31 LAB — I-STAT CG4 LACTIC ACID, ED: Lactic Acid, Venous: 0.78 mmol/L (ref 0.5–2.0)

## 2015-03-31 LAB — GLUCOSE, CAPILLARY
GLUCOSE-CAPILLARY: 126 mg/dL — AB (ref 70–99)
Glucose-Capillary: 107 mg/dL — ABNORMAL HIGH (ref 70–99)

## 2015-03-31 LAB — TSH: TSH: 0.662 u[IU]/mL (ref 0.350–4.500)

## 2015-03-31 LAB — MAGNESIUM: Magnesium: 1.8 mg/dL (ref 1.5–2.5)

## 2015-03-31 LAB — MRSA PCR SCREENING: MRSA BY PCR: NEGATIVE

## 2015-03-31 MED ORDER — SODIUM CHLORIDE 0.9 % IV SOLN: 250.0000 mL | INTRAVENOUS | Status: DC | PRN

## 2015-03-31 MED ORDER — LABETALOL HCL 200 MG PO TABS
400.0000 mg | ORAL_TABLET | Freq: Three times a day (TID) | ORAL | Status: DC
  Administered 2015-04-01 – 2015-04-03 (×8): 400 mg via ORAL
  Filled 2015-03-31 (×11): qty 2

## 2015-03-31 MED ORDER — FUROSEMIDE 10 MG/ML IJ SOLN
40.0000 mg | Freq: Once | INTRAMUSCULAR | Status: AC
  Administered 2015-03-31: 40 mg via INTRAVENOUS

## 2015-03-31 MED ORDER — FENOFIBRATE 160 MG PO TABS: 160.0000 mg | ORAL_TABLET | Freq: Every day | ORAL | Status: DC

## 2015-03-31 MED ORDER — HEPARIN SODIUM (PORCINE) 5000 UNIT/ML IJ SOLN
5000.0000 [IU] | Freq: Three times a day (TID) | INTRAMUSCULAR | Status: DC
  Administered 2015-03-31 – 2015-04-03 (×10): 5000 [IU] via SUBCUTANEOUS
  Filled 2015-03-31 (×13): qty 1

## 2015-03-31 MED ORDER — NITROGLYCERIN 2 % TD OINT
0.5000 [in_us] | TOPICAL_OINTMENT | Freq: Once | TRANSDERMAL | Status: AC
  Administered 2015-03-31: 0.5 [in_us] via TOPICAL

## 2015-03-31 MED ORDER — SODIUM CHLORIDE 0.9 % IJ SOLN
3.0000 mL | Freq: Two times a day (BID) | INTRAMUSCULAR | Status: DC
  Administered 2015-03-31 – 2015-04-04 (×6): 3 mL via INTRAVENOUS

## 2015-03-31 MED ORDER — HYDROCODONE-ACETAMINOPHEN 5-325 MG PO TABS
1.0000 | ORAL_TABLET | ORAL | Status: DC | PRN
  Administered 2015-03-31 – 2015-04-04 (×4): 1 via ORAL
  Filled 2015-03-31 (×4): qty 1

## 2015-03-31 MED ORDER — INSULIN ASPART 100 UNIT/ML ~~LOC~~ SOLN: 0.0000 [IU] | Freq: Every day | SUBCUTANEOUS | Status: DC

## 2015-03-31 MED ORDER — FUROSEMIDE 10 MG/ML IJ SOLN
INTRAMUSCULAR | Status: AC
  Filled 2015-03-31: qty 4

## 2015-03-31 MED ORDER — ACETAMINOPHEN 325 MG PO TABS: 650.0000 mg | ORAL_TABLET | Freq: Four times a day (QID) | ORAL | Status: DC | PRN

## 2015-03-31 MED ORDER — FELODIPINE ER 10 MG PO TB24
10.0000 mg | ORAL_TABLET | Freq: Every day | ORAL | Status: DC
  Administered 2015-04-01 – 2015-04-04 (×4): 10 mg via ORAL
  Filled 2015-03-31 (×5): qty 1

## 2015-03-31 MED ORDER — SODIUM CHLORIDE 0.9 % IJ SOLN: 3.0000 mL | INTRAMUSCULAR | Status: DC | PRN

## 2015-03-31 MED ORDER — INSULIN ASPART 100 UNIT/ML ~~LOC~~ SOLN
0.0000 [IU] | Freq: Three times a day (TID) | SUBCUTANEOUS | Status: DC
  Administered 2015-04-01 – 2015-04-03 (×3): 1 [IU] via SUBCUTANEOUS

## 2015-03-31 MED ORDER — FERROUS SULFATE 325 (65 FE) MG PO TABS
325.0000 mg | ORAL_TABLET | Freq: Two times a day (BID) | ORAL | Status: DC
  Administered 2015-04-01 – 2015-04-04 (×7): 325 mg via ORAL
  Filled 2015-03-31 (×9): qty 1

## 2015-03-31 MED ORDER — SODIUM CHLORIDE 0.9 % IJ SOLN
3.0000 mL | Freq: Two times a day (BID) | INTRAMUSCULAR | Status: DC
  Administered 2015-04-01 – 2015-04-03 (×4): 3 mL via INTRAVENOUS

## 2015-03-31 MED ORDER — ALBUTEROL SULFATE (2.5 MG/3ML) 0.083% IN NEBU
INHALATION_SOLUTION | RESPIRATORY_TRACT | Status: AC
  Administered 2015-03-31: 2.5 mg
  Filled 2015-03-31: qty 6

## 2015-03-31 MED ORDER — ATORVASTATIN CALCIUM 20 MG PO TABS
20.0000 mg | ORAL_TABLET | Freq: Every day | ORAL | Status: DC
  Administered 2015-04-01 – 2015-04-03 (×3): 20 mg via ORAL
  Filled 2015-03-31 (×4): qty 1

## 2015-03-31 MED ORDER — ALBUTEROL SULFATE (2.5 MG/3ML) 0.083% IN NEBU
2.5000 mg | INHALATION_SOLUTION | Freq: Once | RESPIRATORY_TRACT | Status: AC
  Administered 2015-03-31: 2.5 mg via RESPIRATORY_TRACT

## 2015-03-31 MED ORDER — NITROGLYCERIN 2 % TD OINT
TOPICAL_OINTMENT | TRANSDERMAL | Status: AC
  Filled 2015-03-31: qty 1

## 2015-03-31 MED ORDER — ACETAMINOPHEN 650 MG RE SUPP: 650.0000 mg | Freq: Four times a day (QID) | RECTAL | Status: DC | PRN

## 2015-03-31 NOTE — ED Provider Notes (Signed)
CSN: [id number]     Arrival date & time 03/31/15  1025 History   First MD Initiated Contact with Patient 03/31/15 1030     Chief Complaint  Patient presents with  . Shortness of Breath     (Consider location/radiation/quality/duration/timing/severity/associated sxs/prior Treatment) HPI Patient presents with her husband who provides history of present illness, as the patient has respiratory distress. He states that about 2 days ago she began to have dyspnea, worse with supine positioning. Subsequent, she has developed diaphoresis, fatigue. There is new chest tightness as well. Patient has no known history of pulmonary disease, nor coronary disease. She does have a history of aneurysm repair, as well as hypertension. She also has a history of recent abdominal wound repair, with no known complications, but with open wound in her lower abdomen. Level V caveat secondary to respiratory distress.  Past Medical History  Diagnosis Date  . Diabetes mellitus without complication   . Hypertension   . AAA (abdominal aortic aneurysm)-repaired   . Irregular heart beat   . Acute bronchitis   . Tachycardia     Unspecified   Past Surgical History  Procedure Laterality Date  . Abdominal hysterectomy      40 years ago, intestinal damage  . Abdominal aortic aneurysm repair  2012    Dr. Arbie Cookey  . Breast lumpectomy Left     Benign Mass  . Thoracic aortic endovascular stent graft N/A 09/27/2014    Procedure: THORACIC AORTIC ENDOVASCULAR STENT GRAFT;  Surgeon: Nada Libman, MD;  Location: [facility] OR;  Service: Vascular;  Laterality: N/A;  . Oophorectomy      40 years ago  . Incision and drainage abscess N/A 02/28/2015    Procedure: INCISION AND DRAINAGE ABSCESS ABDOMINAL WALL;  Surgeon: Claud Kelp, MD;  Location: [facility] OR;  Service: General;  Laterality: N/A;   Family History  Problem Relation Age of Onset  . Heart disease Sister    History  Substance Use Topics  . Smoking status: Never Smoker    . Smokeless tobacco: Not on file  . Alcohol Use: No   OB History    No data available     Review of Systems  Unable to perform ROS: Acuity of condition      Allergies  Review of patient's allergies indicates no known allergies.  Home Medications   Prior to Admission medications   Medication Sig Start Date End Date Taking? Authorizing Provider  atorvastatin (LIPITOR) 20 MG tablet Take 1 tablet (20 mg total) by mouth daily at 6 PM. 09/12/14  Yes Rhonda G Barrett, PA-C  calcium carbonate (OS-CAL) 600 MG TABS tablet Take 600 mg by mouth 2 (two) times daily with a meal.   Yes Historical Provider, MD  felodipine (PLENDIL) 10 MG 24 hr tablet Take 10 mg by mouth daily.  08/07/14  Yes Historical Provider, MD  fenofibrate 160 MG tablet Take 1 tablet (160 mg total) by mouth daily. 03/04/15  Yes Catarina Hartshorn, MD  ferrous sulfate 325 (65 FE) MG tablet Take 1 tablet (325 mg total) by mouth 2 (two) times daily with a meal. 03/04/15  Yes Catarina Hartshorn, MD  HYDROcodone-acetaminophen (NORCO/VICODIN) 5-325 MG per tablet Take 1-2 tablets by mouth every 4 (four) hours as needed for moderate pain. 03/04/15  Yes Catarina Hartshorn, MD  labetalol (NORMODYNE) 200 MG tablet Take 2 tablets (400 mg total) by mouth 3 (three) times daily. 09/12/14  Yes Rhonda G Barrett, PA-C  metFORMIN (GLUCOPHAGE) 500 MG tablet Take 500 mg  by mouth 2 (two) times daily with a meal.  07/28/14  Yes Historical Provider, MD  Polyethyl Glycol-Propyl Glycol (SYSTANE OP) Place 1 drop into both eyes 2 (two) times daily.   Yes Historical Provider, MD  Vitamin D, Ergocalciferol, (DRISDOL) 50000 UNITS CAPS capsule Take 50,000 Units by mouth every 7 (seven) days. Every saturday   Yes Historical Provider, MD  cephALEXin (KEFLEX) 500 MG capsule Take 1 capsule (500 mg total) by mouth every 8 (eight) hours. 03/04/15   Catarina Hartshornavid Tat, MD  metroNIDAZOLE (FLAGYL) 500 MG tablet Take 1 tablet (500 mg total) by mouth every 8 (eight) hours. 03/04/15   Catarina Hartshornavid Tat, MD   BP 177/108 mmHg   Pulse 81  Temp(Src) 98.2 F (36.8 C) (Oral)  Resp 27  Ht 5\' 3"  (1.6 m)  Wt 133 lb (60.328 kg)  BMI 23.57 kg/m2  SpO2 89% Physical Exam  Constitutional: She is oriented to person, place, and time. She appears well-developed and well-nourished. She appears distressed.  HENT:  Head: Normocephalic and atraumatic.  Eyes: Conjunctivae and EOM are normal.  Cardiovascular: Normal rate and regular rhythm.   Pulmonary/Chest: Accessory muscle usage present. No stridor. Tachypnea noted. She is in respiratory distress. She has decreased breath sounds. She has wheezes.  Abdominal: She exhibits no distension.    Musculoskeletal: She exhibits no edema.  Neurological: She is alert and oriented to person, place, and time. No cranial nerve deficit.  Skin: Skin is warm. She is diaphoretic.  Psychiatric: She has a normal mood and affect.  Nursing note and vitals reviewed.   ED Course  Procedures (including critical care time) Labs Review Labs Reviewed  COMPREHENSIVE METABOLIC PANEL - Abnormal; Notable for the following:    Glucose, Bld 124 (*)    Creatinine, Ser 1.44 (*)    GFR calc non Af Amer 32 (*)    GFR calc Af Amer 37 (*)    All other components within normal limits  BRAIN NATRIURETIC PEPTIDE - Abnormal; Notable for the following:    B Natriuretic Peptide 915.6 (*)    All other components within normal limits  CBC WITH DIFFERENTIAL/PLATELET - Abnormal; Notable for the following:    RBC 3.11 (*)    Hemoglobin 9.1 (*)    HCT 29.9 (*)    RDW 17.2 (*)    All other components within normal limits  TROPONIN I  I-STAT CG4 LACTIC ACID, ED    Imaging Review Dg Chest 2 View  03/31/2015   CLINICAL DATA:  Shortness of breath.  EXAM: CHEST  2 VIEW  COMPARISON:  10/31/2014  FINDINGS: Aortic arch and descending thoracic aortic stent noted.  Bilateral interstitial accentuation with Kerley B-lines. Abnormal airspace opacity in the right suprahilar region. Mild atelectasis along both  hemidiaphragms.  Mild blunting of the left costophrenic angle. Mild enlargement of the cardiopericardial silhouette  IMPRESSION: 1. Mild cardiomegaly with interstitial accentuation and Kerley B-lines compatible with acute pulmonary edema. There is asymmetric airspace edema or pneumonia in the right suprahilar region. 2. Mild bibasilar atelectasis. 3. Small left pleural effusion. 4. Aortic arch and descending thoracic aortic stent noted.   Electronically Signed   By: Gaylyn RongWalter  Liebkemann M.D.   On: 03/31/2015 10:59     EKG Interpretation   Date/Time:  Friday March 31 2015 10:40:50 EDT Ventricular Rate:  79 PR Interval:  168 QRS Duration: 126 QT Interval:  424 QTC Calculation: 486 R Axis:   -44 Text Interpretation:  Normal sinus rhythm Left axis deviation Right bundle  branch block Septal infarct , age undetermined Abnormal ECG Sinus rhythm  Non-specific intra-ventricular conduction delay Left axis deviation No  significant change since last tracing Abnormal ekg Confirmed by Gerhard Munch  MD [id number]) on 03/31/2015 10:50:54 AM     With diminished breath sounds initially, evidence for pulmonary edema on x-ray, patient will have albuterol, Lasix, Nitropaste.  11:31 AM Patient with more audible breath sounds following albuterol treatment. With evidence for heart failure, patient will start BiPAP.  1:02 PM Patient off BiPAP, on nasal cannula, satting 93%/96%. We discussed all findings at length.  With concern for congestive heart failure versus pulmonary edema secondary to hypertension, as well as new acute kidney injury, patient will be admitted for further evaluation and management. MDM  Patient presents in respiratory distress. Initially the patient was hypoxic, hypertensive, and with acute kidney injury, there suspicion for flash pulmonary edema secondary to hypertensive crisis. However, the patient also has elevated BNP, which may be contributory. Patient improved substantially here  with nitroglycerin paste, noninvasive ventilatory support, Lasix. Given the patient's presentation of respiratory distress, she was admitted to the step down unit for further evaluation and management.  CRITICAL CARE Performed by: Gerhard Munch Total critical care time: 40 Critical care time was exclusive of separately billable procedures and treating other patients. Critical care was necessary to treat or prevent imminent or life-threatening deterioration. Critical care was time spent personally by me on the following activities: development of treatment plan with patient and/or surrogate as well as nursing, discussions with consultants, evaluation of patient's response to treatment, examination of patient, obtaining history from patient or surrogate, ordering and performing treatments and interventions, ordering and review of laboratory studies, ordering and review of radiographic studies, pulse oximetry and re-evaluation of patient's condition.     Gerhard Munch, MD 03/31/15 [phone]

## 2015-03-31 NOTE — ED Notes (Signed)
Pt reports progressive shortness of breath over the past two days that worsens when she lays flat. Reports recently discharged from hospital for Cardiac issues.

## 2015-03-31 NOTE — ED Notes (Addendum)
Pt returned from Xray, patient now clammy, shortness of breath has worsened - EDP, RT, called to bedside.

## 2015-03-31 NOTE — ED Notes (Signed)
MD at bedside. 

## 2015-03-31 NOTE — H&P (Signed)
[facility] History and Physical  Jennifer [name] ZOX:[id number] DOB: 1925/04/21 DOA: 03/31/2015  Referring physician: Dr Jeraldine Loots PCP: Kristie Cowman, MD   Chief Complaint: Shortness of breath  HPI: Jennifer [name] is a 81 y.o. female  With history of hypertension, congestive diastolic heart failure NYHA class III, diabetes on metformin, CK D stage III, history of thoracic aortic aneurysm with history of repair, and iron deficiency. Presents the hospital complaining of shortness of breath. Problem was described as persistent and gradually getting worse since onset. Patient states that within last 2 days she has had difficulty breathing. She had one bout prior to today's visit that resolved quickly per her husband. Today it was bad enough to where she presented to the hospital for further evaluation. According to EMR notes particularly to notes from the ED patient required BiPAP. Her blood pressure initially was found to be elevated and chest x-ray was obtained which was consistent with flash pulmonary edema and curly B-lines. Further workup in the ED which show elevated BNP of 915.  Patient denies any hemoptysis or any sick contacts.   Of note patient had I&D and debridement of necrotic fascia on 329 for complex abdominal wall abscess by Dr. Derrell Lolling. She was discharged on cephalexin and Flagyl of which she completed. At that time she had a total of 10 days of antibiotic therapy  Review of Systems:  Constitutional:  No weight loss, night sweats, Fevers, chills, fatigue.  HEENT:  No headaches, Difficulty swallowing,Tooth/dental problems,Sore throat,  No sneezing, itching, ear ache, nasal congestion, post nasal drip,  Cardio-vascular:  No chest pain, Orthopnea, PND, swelling in lower extremities, anasarca, dizziness, palpitations  GI:  No heartburn, indigestion, abdominal pain, nausea, vomiting, diarrhea, change in bowel habits, loss of appetite  Resp:  + shortness of breath with exertion  or at rest. No excess mucus, no productive cough, No non-productive cough, No coughing up of blood.No change in color of mucus.No wheezing.No chest wall deformity  Skin:  no rash or lesions.  GU:  no dysuria, change in color of urine, no urgency or frequency. No flank pain.  Musculoskeletal:  No joint pain or swelling. No decreased range of motion. No back pain.  Psych:  No change in mood or affect. No depression or anxiety. No memory loss.   Past Medical History  Diagnosis Date  . Diabetes mellitus without complication   . Hypertension   . AAA (abdominal aortic aneurysm)-repaired   . Irregular heart beat   . Acute bronchitis   . Tachycardia     Unspecified   Past Surgical History  Procedure Laterality Date  . Abdominal hysterectomy      40 years ago, intestinal damage  . Abdominal aortic aneurysm repair  2012    Dr. Arbie Cookey  . Breast lumpectomy Left     Benign Mass  . Thoracic aortic endovascular stent graft N/A 09/27/2014    Procedure: THORACIC AORTIC ENDOVASCULAR STENT GRAFT;  Surgeon: Nada Libman, MD;  Location: [facility] OR;  Service: Vascular;  Laterality: N/A;  . Oophorectomy      40 years ago  . Incision and drainage abscess N/A 02/28/2015    Procedure: INCISION AND DRAINAGE ABSCESS ABDOMINAL WALL;  Surgeon: Claud Kelp, MD;  Location: [facility] OR;  Service: General;  Laterality: N/A;   Social History:  reports that she has never smoked. She does not have any smokeless tobacco history on file. She reports that she does not drink alcohol or use illicit drugs.  No Known  Allergies  Family History  Problem Relation Age of Onset  . Heart disease Sister     Prior to Admission medications   Medication Sig Start Date End Date Taking? Authorizing Provider  atorvastatin (LIPITOR) 20 MG tablet Take 1 tablet (20 mg total) by mouth daily at 6 PM. 09/12/14  Yes Rhonda G Barrett, PA-C  calcium carbonate (OS-CAL) 600 MG TABS tablet Take 600 mg by mouth 2 (two) times daily with a meal.    Yes Historical Provider, MD  felodipine (PLENDIL) 10 MG 24 hr tablet Take 10 mg by mouth daily.  08/07/14  Yes Historical Provider, MD  fenofibrate 160 MG tablet Take 1 tablet (160 mg total) by mouth daily. 03/04/15  Yes Catarina Hartshornavid Tat, MD  ferrous sulfate 325 (65 FE) MG tablet Take 1 tablet (325 mg total) by mouth 2 (two) times daily with a meal. 03/04/15  Yes Catarina Hartshornavid Tat, MD  HYDROcodone-acetaminophen (NORCO/VICODIN) 5-325 MG per tablet Take 1-2 tablets by mouth every 4 (four) hours as needed for moderate pain. 03/04/15  Yes Catarina Hartshornavid Tat, MD  labetalol (NORMODYNE) 200 MG tablet Take 2 tablets (400 mg total) by mouth 3 (three) times daily. 09/12/14  Yes Rhonda G Barrett, PA-C  metFORMIN (GLUCOPHAGE) 500 MG tablet Take 500 mg by mouth 2 (two) times daily with a meal.  07/28/14  Yes Historical Provider, MD  Polyethyl Glycol-Propyl Glycol (SYSTANE OP) Place 1 drop into both eyes 2 (two) times daily.   Yes Historical Provider, MD  Vitamin D, Ergocalciferol, (DRISDOL) 50000 UNITS CAPS capsule Take 50,000 Units by mouth every 7 (seven) days. Every saturday   Yes Historical Provider, MD  cephALEXin (KEFLEX) 500 MG capsule Take 1 capsule (500 mg total) by mouth every 8 (eight) hours. 03/04/15   Catarina Hartshornavid Tat, MD  metroNIDAZOLE (FLAGYL) 500 MG tablet Take 1 tablet (500 mg total) by mouth every 8 (eight) hours. 03/04/15   Catarina Hartshornavid Tat, MD   Physical Exam: Filed Vitals:   03/31/15 1500 03/31/15 1515 03/31/15 1530 03/31/15 1712  BP: 159/75  168/76 161/74  Pulse: 78 77 76 75  Temp:    97.7 F (36.5 C)  TempSrc:    Oral  Resp: 18 27 23 23   Height:    5\' 3"  (1.6 m)  Weight:    59.8 kg (131 lb 13.4 oz)  SpO2: 95% 95% 96% 97%    Wt Readings from Last 3 Encounters:  03/31/15 59.8 kg (131 lb 13.4 oz)  03/04/15 65.635 kg (144 lb 11.2 oz)  02/14/15 59.149 kg (130 lb 6.4 oz)    General:  Appears calm and comfortable Eyes: PERRL, normal lids, irises & conjunctiva ENT: grossly normal hearing, lips & tongue Neck: no LAD, masses or  thyromegaly Cardiovascular: RRR, no m/r/g. + LE edema. Respiratory: CTA bilaterally, no w/r/r. Normal respiratory effort. Abdomen: soft, gauze over her abdomen, no discharge, nondistended Skin: no rash or induration seen on limited exam Musculoskeletal: grossly normal tone BUE/BLE Psychiatric: grossly normal mood and affect, speech fluent and appropriate Neurologic: grossly non-focal.          Labs on Admission:  Basic Metabolic Panel:  Recent Labs Lab 03/31/15 1045  NA 138  K 4.3  CL 109  CO2 24  GLUCOSE 124*  BUN 21  CREATININE 1.44*  CALCIUM 9.3   Liver Function Tests:  Recent Labs Lab 03/31/15 1045  AST 14  ALT 11  ALKPHOS 43  BILITOT 0.4  PROT 7.0  ALBUMIN 3.5   No results for input(s): LIPASE,  AMYLASE in the last 168 hours. No results for input(s): AMMONIA in the last 168 hours. CBC:  Recent Labs Lab 03/31/15 1045  WBC 7.0  NEUTROABS 4.8  HGB 9.1*  HCT 29.9*  MCV 96.1  PLT 258   Cardiac Enzymes:  Recent Labs Lab 03/31/15 1045  TROPONINI <0.03    BNP (last 3 results)  Recent Labs  03/31/15 1045  BNP 915.6*    ProBNP (last 3 results) No results for input(s): PROBNP in the last 8760 hours.  CBG:  Recent Labs Lab 03/31/15 1736  GLUCAP 107*    Radiological Exams on Admission: Dg Chest 2 View  03/31/2015   CLINICAL DATA:  Shortness of breath.  EXAM: CHEST  2 VIEW  COMPARISON:  10/31/2014  FINDINGS: Aortic arch and descending thoracic aortic stent noted.  Bilateral interstitial accentuation with Kerley B-lines. Abnormal airspace opacity in the right suprahilar region. Mild atelectasis along both hemidiaphragms.  Mild blunting of the left costophrenic angle. Mild enlargement of the cardiopericardial silhouette  IMPRESSION: 1. Mild cardiomegaly with interstitial accentuation and Kerley B-lines compatible with acute pulmonary edema. There is asymmetric airspace edema or pneumonia in the right suprahilar region. 2. Mild bibasilar  atelectasis. 3. Small left pleural effusion. 4. Aortic arch and descending thoracic aortic stent noted.   Electronically Signed   By: Gaylyn Rong M.D.   On: 03/31/2015 10:59    EKG: Independently reviewed. Sinus rhythm with no ST elevations or depressions  Assessment/Plan  Hypertensive urgency -Most likely cause of acute respiratory distress and flash pulmonary edema. Continuing home medication regimen. Last blood pressure 161/74 - We'll continue to monitor blood pressure and adjust antihypertensives pending values  Acute respiratory distress - Secondary to flash pulmonary edema. - Given elevated BNP we'll obtain echocardiogram, troponins every 6 hours 3, EKG next a.m., and monitor patient on telemetry. - Currently improved and patient breathing comfortably and speaking in full sentences with supplemental oxygen via nasal cannula   Active Problems:   Acute on chronic diastolic CHF (congestive heart failure), NYHA class 3 - Elevated BNP as mentioned above will obtain echocardiogram - Given flash pulmonary edema most likely secondary to hypertensive urgency will continue beta blocker    CKD (chronic kidney disease), stage III - We'll discontinue metformin as this is contraindicated in patients with serum creatinine of 1.4 in women. Given history of CK D stage III patient may be best served with another hypoglycemic agent.    Diabetes - Please see comment above - Sliding scale insulin and diabetic diet    Abdominal abscess - We'll consult wound care nurse to evaluate site and make further treatment for routine care recommendations    Anemia, iron deficiency  - We'll continue oral iron replacement therapy -Obtain CBC next a.m.   Code Status: *Full DVT Prophylaxis: Heparin Family Communication: Discussed with patient and spouse Disposition Plan: Pending improvement in condition and further workup. For now stepdown  Time spent: > 55 minutes  Penny Pia [facility] Pager [phone]

## 2015-03-31 NOTE — ED Notes (Signed)
Pt taken off bipap at this time, no distress noted, placed on 4lpm [place] sats 96%.

## 2015-03-31 NOTE — ED Notes (Signed)
Pt tolerating Bi Pap well at this time, significant decrease in work of breathing noted, patient calm, 100% O2.

## 2015-03-31 NOTE — Progress Notes (Signed)
Patient presents with acute hypoxic resp failure, sat in the 80. She received IV lasix. Started on BIPAP. She has improved clinically. She is off BIPAP. 4 L of oxygen. Also BP was 200 range. Accepted to step down. Resp failure thought to be to malignant HTN and pulmonary edema.  Raniah Karan, Md.

## 2015-03-31 NOTE — ED Notes (Signed)
Pt extremely restless at this time, anxious, asking this RN to "put her to sleep." O2 sat 84% despite being on 100% NRB 15L. Pt reassured that she will feel much better with Bipap in place. RT at bedside to place Bipap.

## 2015-04-01 DIAGNOSIS — I509 Heart failure, unspecified: Secondary | ICD-10-CM

## 2015-04-01 LAB — BASIC METABOLIC PANEL
ANION GAP: 8 (ref 5–15)
BUN: 20 mg/dL (ref 6–23)
CALCIUM: 9.3 mg/dL (ref 8.4–10.5)
CHLORIDE: 106 mmol/L (ref 96–112)
CO2: 26 mmol/L (ref 19–32)
CREATININE: 1.51 mg/dL — AB (ref 0.50–1.10)
GFR calc Af Amer: 35 mL/min — ABNORMAL LOW (ref >90)
GFR calc non Af Amer: 30 mL/min — ABNORMAL LOW (ref >90)
GLUCOSE: 131 mg/dL — AB (ref 70–99)
POTASSIUM: 4.2 mmol/L (ref 3.5–5.1)
Sodium: 140 mmol/L (ref 135–145)

## 2015-04-01 LAB — CBC
HEMATOCRIT: 28 % — AB (ref 36.0–46.0)
Hemoglobin: 8.7 g/dL — ABNORMAL LOW (ref 12.0–15.0)
MCH: 29 pg (ref 26.0–34.0)
MCHC: 31.1 g/dL (ref 30.0–36.0)
MCV: 93.3 fL (ref 78.0–100.0)
Platelets: 247 10*3/uL (ref 150–400)
RBC: 3 MIL/uL — ABNORMAL LOW (ref 3.87–5.11)
RDW: 17.1 % — ABNORMAL HIGH (ref 11.5–15.5)
WBC: 7.1 10*3/uL (ref 4.0–10.5)

## 2015-04-01 LAB — TROPONIN I
TROPONIN I: 0.03 ng/mL (ref <0.031)
Troponin I: <0.03 ng/mL (ref <0.031)

## 2015-04-01 LAB — GLUCOSE, CAPILLARY
GLUCOSE-CAPILLARY: 129 mg/dL — AB (ref 70–99)
GLUCOSE-CAPILLARY: 112 mg/dL — AB (ref 70–99)
Glucose-Capillary: 129 mg/dL — ABNORMAL HIGH (ref 70–99)
Glucose-Capillary: 117 mg/dL — ABNORMAL HIGH (ref 70–99)

## 2015-04-01 MED ORDER — WHITE PETROLATUM GEL
Status: AC
  Filled 2015-04-01: qty 1

## 2015-04-01 MED ORDER — FUROSEMIDE 10 MG/ML IJ SOLN
40.0000 mg | Freq: Every day | INTRAMUSCULAR | Status: DC
  Administered 2015-04-01 – 2015-04-04 (×4): 40 mg via INTRAVENOUS
  Filled 2015-04-01 (×4): qty 4

## 2015-04-01 NOTE — Progress Notes (Signed)
[facility] PROGRESS NOTE  Gershon MusselJanie B Reinwald JXB:[id number] DOB: 12/25/1924 DOA: 03/31/2015 PCP: [name], KAREN, MD  Assessment/Plan: Hypertensive urgency -Most likely cause of acute respiratory distress and flash pulmonary edema. Continuing home medication regimen. Last blood pressure 145/71 - We'll continue to monitor blood pressure and adjust antihypertensives pending values  Acute respiratory distress - Secondary to flash pulmonary edema. - Given elevated BNP we'll obtain echocardiogram, troponins every 6 hours 3, EKG next a.m., and monitor patient on telemetry. - Currently improved and patient breathing comfortably and speaking in full sentences with supplemental oxygen via nasal cannula   Active Problems:  Acute on chronic diastolic CHF (congestive heart failure), NYHA class 3 - Elevated BNP as mentioned above will obtain echocardiogram (results pending) - Given flash pulmonary edema most likely secondary to hypertensive urgency will continue beta blocker   CKD (chronic kidney disease), stage III - We'll discontinue metformin as this is contraindicated in patients with serum creatinine of 1.4 in women. Given history of CK D stage III patient may be best served with another hypoglycemic agent. - Serum creatinine at steady at 1.5   Diabetes - Please see comment above - Sliding scale insulin and diabetic diet   Abdominal abscess - wound care nurse consulted.   Anemia, iron deficiency - We'll continue oral iron replacement therapy - hgb stable at 8.7  Code Status: full Family Communication: no family at bedside  Disposition Plan: pending improvement in condition.   Consultants:  none  Procedures:  none  Antibiotics:  none   HPI/Subjective: Pt has no new complaints. No acute issues reported overnight.  Objective: Filed Vitals:   04/01/15 1217  BP: 145/71  Pulse: 73  Temp: 98 F (36.7 C)  Resp: 23    Intake/Output Summary (Last 24 hours) at  04/01/15 1219 Last data filed at 04/01/15 0806  Gross per 24 hour  Intake    126 ml  Output   2600 ml  Net  -2474 ml   Filed Weights   03/31/15 1029 03/31/15 1712  Weight: 60.328 kg (133 lb) 59.8 kg (131 lb 13.4 oz)    Exam:   General:  Pt in nad, alert and awake  Cardiovascular: rrr, no rubs  Respiratory: cta bl, no wheezes  Abdomen: ND, Nt  Musculoskeletal: no cyanosis or clubbing   Data Reviewed: Basic Metabolic Panel:  Recent Labs Lab 03/31/15 1045 03/31/15 2029 04/01/15 0530  NA 138  --  140  K 4.3  --  4.2  CL 109  --  106  CO2 24  --  26  GLUCOSE 124*  --  131*  BUN 21  --  20  CREATININE 1.44* 1.56* 1.51*  CALCIUM 9.3  --  9.3  MG  --  1.8  --   PHOS  --  4.2  --    Liver Function Tests:  Recent Labs Lab 03/31/15 1045  AST 14  ALT 11  ALKPHOS 43  BILITOT 0.4  PROT 7.0  ALBUMIN 3.5   No results for input(s): LIPASE, AMYLASE in the last 168 hours. No results for input(s): AMMONIA in the last 168 hours. CBC:  Recent Labs Lab 03/31/15 1045 03/31/15 2029 04/01/15 0530  WBC 7.0 6.5 7.1  NEUTROABS 4.8  --   --   HGB 9.1* 8.5* 8.7*  HCT 29.9* 27.8* 28.0*  MCV 96.1 92.7 93.3  PLT 258 256 247   Cardiac Enzymes:  Recent Labs Lab 03/31/15 1045 03/31/15 2029 04/01/15 0040 04/01/15 0530  TROPONINI <0.03 <  0.03 0.03 <0.03   BNP (last 3 results)  Recent Labs  03/31/15 1045  BNP 915.6*    ProBNP (last 3 results) No results for input(s): PROBNP in the last 8760 hours.  CBG:  Recent Labs Lab 03/31/15 1736 03/31/15 2216 04/01/15 0754  GLUCAP 107* 126* 129*    Recent Results (from the past 240 hour(s))  MRSA PCR Screening     Status: None   Collection Time: 03/31/15  5:20 PM  Result Value Ref Range Status   MRSA by PCR NEGATIVE NEGATIVE Final    Comment:        The GeneXpert MRSA Assay (FDA approved for NASAL specimens only), is one component of a comprehensive MRSA colonization surveillance program. It is  not intended to diagnose MRSA infection nor to guide or monitor treatment for MRSA infections.      Studies: Dg Chest 2 View  03/31/2015   CLINICAL DATA:  Shortness of breath.  EXAM: CHEST  2 VIEW  COMPARISON:  10/31/2014  FINDINGS: Aortic arch and descending thoracic aortic stent noted.  Bilateral interstitial accentuation with Kerley B-lines. Abnormal airspace opacity in the right suprahilar region. Mild atelectasis along both hemidiaphragms.  Mild blunting of the left costophrenic angle. Mild enlargement of the cardiopericardial silhouette  IMPRESSION: 1. Mild cardiomegaly with interstitial accentuation and Kerley B-lines compatible with acute pulmonary edema. There is asymmetric airspace edema or pneumonia in the right suprahilar region. 2. Mild bibasilar atelectasis. 3. Small left pleural effusion. 4. Aortic arch and descending thoracic aortic stent noted.   Electronically Signed   By: Gaylyn Rong M.D.   On: 03/31/2015 10:59    Scheduled Meds: . atorvastatin  20 mg Oral q1800  . felodipine  10 mg Oral Daily  . ferrous sulfate  325 mg Oral BID WC  . furosemide  40 mg Intravenous Daily  . heparin  5,000 Units Subcutaneous 3 times per day  . insulin aspart  0-5 Units Subcutaneous QHS  . insulin aspart  0-9 Units Subcutaneous TID WC  . labetalol  400 mg Oral 3 times per day  . sodium chloride  3 mL Intravenous Q12H  . sodium chloride  3 mL Intravenous Q12H   Continuous Infusions:   Active Problems:   Acute on chronic diastolic CHF (congestive heart failure), NYHA class 3   CKD (chronic kidney disease), stage III   Diabetes   Abdominal abscess   Anemia, iron deficiency   Acute respiratory distress   Hypertensive urgency    Time spent: > 35 minutes    Penny Pia  [facility] Pager [phone] If 7PM-7AM, please contact night-coverage at [email or web address], password [facility] 04/01/2015, 12:19 PM  LOS: 1 day

## 2015-04-01 NOTE — Progress Notes (Signed)
*  PRELIMINARY RESULTS* Echocardiogram 2D Echocardiogram has been performed.  Jeryl Columbialliott, Ludwig Tugwell 04/01/2015, 3:23 PM

## 2015-04-02 LAB — GLUCOSE, CAPILLARY
GLUCOSE-CAPILLARY: 95 mg/dL (ref 70–99)
GLUCOSE-CAPILLARY: 98 mg/dL (ref 70–99)
Glucose-Capillary: 114 mg/dL — ABNORMAL HIGH (ref 70–99)
Glucose-Capillary: 90 mg/dL (ref 70–99)

## 2015-04-02 NOTE — Progress Notes (Signed)
Jennifer [name] is a 81 y.o. female patient who [facility] awake, alert  & orientated  X 3, Full Code, VSS - Blood pressure 103/42, pulse 66, temperature 98.2 F (36.8 [name]), temperature source Oral, resp. rate 18, height 5\' 3"  (1.6 m), weight 59.7 kg (131 lb 9.8 oz), SpO2 93 %., O2   @ 3 L nasal cannular, no [name]/o shortness of breath, no [name]/o chest pain, no distress noted. Tele # 28 placed and pt is currently running:normal sinus rhythm.   IV site WDL: forearm left, condition patent and no redness with a transparent dsg that's clean dry and intact.  Allergies:   Allergies  Allergen Reactions  . Tape Other (See Comments)    "paper" tape please, adhesive tape "burns'     Past Medical History  Diagnosis Date  . Diabetes mellitus without complication   . Hypertension   . AAA (abdominal aortic aneurysm)-repaired   . Irregular heart beat   . Acute bronchitis   . Tachycardia     Unspecified    Pt orientation to unit, room and routine. SR up x 2, fall risk assessment complete with Patient and family verbalizing understanding of risks associated with falls. Pt verbalizes an understanding of how to use the call bell and to call for help before getting out of bed.  Skin, clean-dry- intact without evidence of bruising, or skin tears. Redness to sacrum that is blanchable, will place foam dressing for protection and encourage pt. To lay on her side some.    Will cont to monitor and assist as needed.  Joana ReamerJohnson, Jennifer Shear C, RN 04/02/2015 3:58 PM

## 2015-04-02 NOTE — Telephone Encounter (Signed)
OK to refill fenofibrate. Lipids in 3 months.

## 2015-04-02 NOTE — Consult Note (Signed)
WOC wound consult note Reason for Consult:Patient with opened area at base of midline surgical incision. Wound type:surgical complication Pressure Ulcer POA: No Measurement:3cm x 1.5cm x 0.4cm Wound bed: Pink moist, non-granulating Drainage (amount, consistency, odor) light yellow, no odor Periwound:No erythema, no induration, no warmth Dressing procedure/placement/frequency: Due to chronicity, I will add a topical antimicrobial dressing (Aquacel Ag+) a silver hydrofiber to jumpstart this wound and enhance absorption of excess exudate.  The antimicrobial nature of the product may also regulate the bioburden overload that I suspect is present.  Patient is to follow up with her surgeon post discharge.  WOC nursing team will not follow, but will remain available to this patient, the nursing, surgical and medical teams.  Please re-consult if needed. Thanks, Ladona MowLaurie Emonee Winkowski, MSN, RN, GNP, [place], CWON-AP [phone])

## 2015-04-02 NOTE — Progress Notes (Signed)
[facility] PROGRESS NOTE  Jennifer MusselJanie B [name] EAV:[id number] DOB: 09/12/1925 DOA: 03/31/2015 PCP: Jennifer CowmanSCHOOLER, KAREN, MD  Assessment/Plan: Hypertensive urgency -Most likely cause of acute respiratory distress and flash pulmonary edema. Continuing home medication regimen. Last blood pressure 145/71 - We'll continue to monitor blood pressure and adjust antihypertensives pending values  Acute respiratory distress - Secondary to flash pulmonary edema. - Troponin negative x 3. -  Improving on lasix 40 mg IV daily  Active Problems:  Acute on chronic diastolic CHF (congestive heart failure), NYHA class 3 - Elevated BNP. Echocardiogram reporting grade 2 DD - Given flash pulmonary edema most likely secondary to hypertensive urgency will continue beta blocker   CKD (chronic kidney disease), stage III - We'll discontinue metformin as this is contraindicated in patients with serum creatinine of 1.4 in women. Given history of CK D stage III patient may be best served with another hypoglycemic agent. - Serum creatinine at steady at 1.5   Diabetes - Please see comment above - Sliding scale insulin and diabetic diet   Abdominal abscess - wound care nurse consulted.   Anemia, iron deficiency - We'll continue oral iron replacement therapy - hgb stable at 8.7 on last check  Code Status: full Family Communication: no family at bedside  Disposition Plan: transfer to telemetry   Consultants:  none  Procedures:  none  Antibiotics:  none   HPI/Subjective: Pt has no new complaints no acute issues reported overnight.  Objective: Filed Vitals:   04/02/15 1224  BP: 99/55  Pulse: 66  Temp: 97.9 F (36.6 C)  Resp: 17    Intake/Output Summary (Last 24 hours) at 04/02/15 1247 Last data filed at 04/02/15 1012  Gross per 24 hour  Intake    600 ml  Output   2450 ml  Net  -1850 ml   Filed Weights   03/31/15 1029 03/31/15 1712 04/02/15 0453  Weight: 60.328 kg (133 lb) 59.8 kg  (131 lb 13.4 oz) 59.7 kg (131 lb 9.8 oz)    Exam:   General:  Pt in nad, alert and awake  Cardiovascular: rrr, no rubs  Respiratory: cta bl, no wheezes  Abdomen: ND, Nt  Musculoskeletal: no cyanosis or clubbing   Data Reviewed: Basic Metabolic Panel:  Recent Labs Lab 03/31/15 1045 03/31/15 2029 04/01/15 0530  NA 138  --  140  K 4.3  --  4.2  CL 109  --  106  CO2 24  --  26  GLUCOSE 124*  --  131*  BUN 21  --  20  CREATININE 1.44* 1.56* 1.51*  CALCIUM 9.3  --  9.3  MG  --  1.8  --   PHOS  --  4.2  --    Liver Function Tests:  Recent Labs Lab 03/31/15 1045  AST 14  ALT 11  ALKPHOS 43  BILITOT 0.4  PROT 7.0  ALBUMIN 3.5   No results for input(s): LIPASE, AMYLASE in the last 168 hours. No results for input(s): AMMONIA in the last 168 hours. CBC:  Recent Labs Lab 03/31/15 1045 03/31/15 2029 04/01/15 0530  WBC 7.0 6.5 7.1  NEUTROABS 4.8  --   --   HGB 9.1* 8.5* 8.7*  HCT 29.9* 27.8* 28.0*  MCV 96.1 92.7 93.3  PLT 258 256 247   Cardiac Enzymes:  Recent Labs Lab 03/31/15 1045 03/31/15 2029 04/01/15 0040 04/01/15 0530  TROPONINI <0.03 <0.03 0.03 <0.03   BNP (last 3 results)  Recent Labs  03/31/15 1045  BNP 915.6*  ProBNP (last 3 results) No results for input(s): PROBNP in the last 8760 hours.  CBG:  Recent Labs Lab 04/01/15 0754 04/01/15 1220 04/01/15 1742 04/01/15 2131 04/02/15 0742  GLUCAP 129* 112* 129* 117* 95    Recent Results (from the past 240 hour(s))  MRSA PCR Screening     Status: None   Collection Time: 03/31/15  5:20 PM  Result Value Ref Range Status   MRSA by PCR NEGATIVE NEGATIVE Final    Comment:        The GeneXpert MRSA Assay (FDA approved for NASAL specimens only), is one component of a comprehensive MRSA colonization surveillance program. It is not intended to diagnose MRSA infection nor to guide or monitor treatment for MRSA infections.      Studies: No results found.  Scheduled Meds: .  atorvastatin  20 mg Oral q1800  . felodipine  10 mg Oral Daily  . ferrous sulfate  325 mg Oral BID WC  . furosemide  40 mg Intravenous Daily  . heparin  5,000 Units Subcutaneous 3 times per day  . insulin aspart  0-5 Units Subcutaneous QHS  . insulin aspart  0-9 Units Subcutaneous TID WC  . labetalol  400 mg Oral 3 times per day  . sodium chloride  3 mL Intravenous Q12H  . sodium chloride  3 mL Intravenous Q12H   Continuous Infusions:   Active Problems:   Acute on chronic diastolic CHF (congestive heart failure), NYHA class 3   CKD (chronic kidney disease), stage III   Diabetes   Abdominal abscess   Anemia, iron deficiency   Acute respiratory distress   Hypertensive urgency    Time spent: > 35 minutes    Penny Pia  [facility] Pager [phone] If 7PM-7AM, please contact night-coverage at [email or web address], password [facility] 04/02/2015, 12:47 PM  LOS: 2 days

## 2015-04-03 ENCOUNTER — Encounter (HOSPITAL_COMMUNITY): Payer: Self-pay | Admitting: General Practice

## 2015-04-03 DIAGNOSIS — E876 Hypokalemia: Secondary | ICD-10-CM

## 2015-04-03 LAB — BASIC METABOLIC PANEL
Anion gap: 8 (ref 5–15)
BUN: 19 mg/dL (ref 6–20)
CALCIUM: 9.3 mg/dL (ref 8.9–10.3)
CO2: 27 mmol/L (ref 22–32)
Chloride: 106 mmol/L (ref 101–111)
Creatinine, Ser: 1.28 mg/dL — ABNORMAL HIGH (ref 0.44–1.00)
GFR, EST AFRICAN AMERICAN: 42 mL/min — AB (ref >60)
GFR, EST NON AFRICAN AMERICAN: 36 mL/min — AB (ref >60)
GLUCOSE: 95 mg/dL (ref 70–99)
Potassium: 3.3 mmol/L — ABNORMAL LOW (ref 3.5–5.1)
Sodium: 141 mmol/L (ref 135–145)

## 2015-04-03 LAB — CBC
HCT: 28.6 % — ABNORMAL LOW (ref 36.0–46.0)
Hemoglobin: 8.9 g/dL — ABNORMAL LOW (ref 12.0–15.0)
MCH: 29 pg (ref 26.0–34.0)
MCHC: 31.1 g/dL (ref 30.0–36.0)
MCV: 93.2 fL (ref 78.0–100.0)
Platelets: 268 10*3/uL (ref 150–400)
RBC: 3.07 MIL/uL — ABNORMAL LOW (ref 3.87–5.11)
RDW: 16.9 % — ABNORMAL HIGH (ref 11.5–15.5)
WBC: 5.6 10*3/uL (ref 4.0–10.5)

## 2015-04-03 LAB — GLUCOSE, CAPILLARY
GLUCOSE-CAPILLARY: 97 mg/dL (ref 70–99)
GLUCOSE-CAPILLARY: 88 mg/dL (ref 70–99)
GLUCOSE-CAPILLARY: 116 mg/dL — AB (ref 70–99)

## 2015-04-03 MED ORDER — SODIUM CHLORIDE 0.9 % IV BOLUS (SEPSIS)
250.0000 mL | Freq: Once | INTRAVENOUS | Status: AC
  Administered 2015-04-03: 250 mL via INTRAVENOUS

## 2015-04-03 MED ORDER — LABETALOL HCL 100 MG PO TABS
100.0000 mg | ORAL_TABLET | Freq: Two times a day (BID) | ORAL | Status: DC
  Filled 2015-04-03: qty 1

## 2015-04-03 MED ORDER — POTASSIUM CHLORIDE CRYS ER 20 MEQ PO TBCR
40.0000 meq | EXTENDED_RELEASE_TABLET | Freq: Once | ORAL | Status: AC
  Administered 2015-04-03: 40 meq via ORAL
  Filled 2015-04-03: qty 2

## 2015-04-03 NOTE — Progress Notes (Signed)
[facility] PROGRESS NOTE  Jennifer [name] WUJ:[id number] DOB: 10/26/1925 DOA: 03/31/2015 PCP: Kristie Cowman, MD  Assessment/Plan: Hypertensive urgency -Most likely cause of acute respiratory distress and flash pulmonary edema. Continuing home medication regimen. Last blood pressure 120/66 - We'll continue to monitor blood pressure and adjust antihypertensives pending values  Acute respiratory distress - Secondary to flash pulmonary edema. - Troponin negative x 3. -  Improving on lasix 40 mg IV daily  Active Problems:  Acute on chronic diastolic CHF (congestive heart failure), NYHA class 3 - Elevated BNP. Echocardiogram reporting grade 2 DD - Given flash pulmonary edema most likely secondary to hypertensive urgency will continue beta blocker   CKD (chronic kidney disease), stage III - We'll discontinue metformin as this is contraindicated in patients with serum creatinine of 1.4 in women. Given history of CK D stage III patient may be best served with another hypoglycemic agent. - Serum creatinine at steady at 1.5   Diabetes - Please see comment above - Sliding scale insulin and diabetic diet   Abdominal abscess - wound care nurse consulted.   Anemia, iron deficiency - We'll continue oral iron replacement therapy - hgb stable at 8.9 on last check  Hypokalemia - Replace orally and reassess next a.m.  Code Status: full Family Communication: no family at bedside  Disposition Plan: transfer to telemetry   Consultants:  none  Procedures:  none  Antibiotics:  none   HPI/Subjective: Pt has no new complaints no acute issues reported overnight.  Objective: Filed Vitals:   04/03/15 1531  BP: 120/66  Pulse: 69  Temp:   Resp:     Intake/Output Summary (Last 24 hours) at 04/03/15 1704 Last data filed at 04/03/15 1303  Gross per 24 hour  Intake    240 ml  Output   1025 ml  Net   -785 ml   Filed Weights   03/31/15 1029 03/31/15 1712 04/02/15 0453   Weight: 60.328 kg (133 lb) 59.8 kg (131 lb 13.4 oz) 59.7 kg (131 lb 9.8 oz)    Exam:   General:  Pt in nad, alert and awake  Cardiovascular: rrr, no rubs  Respiratory: cta bl, no wheezes  Abdomen: ND, Nt  Musculoskeletal: no cyanosis or clubbing   Data Reviewed: Basic Metabolic Panel:  Recent Labs Lab 03/31/15 1045 03/31/15 2029 04/01/15 0530 04/03/15 0521  NA 138  --  140 141  K 4.3  --  4.2 3.3*  CL 109  --  106 106  CO2 24  --  26 27  GLUCOSE 124*  --  131* 95  BUN 21  --  20 19  CREATININE 1.44* 1.56* 1.51* 1.28*  CALCIUM 9.3  --  9.3 9.3  MG  --  1.8  --   --   PHOS  --  4.2  --   --    Liver Function Tests:  Recent Labs Lab 03/31/15 1045  AST 14  ALT 11  ALKPHOS 43  BILITOT 0.4  PROT 7.0  ALBUMIN 3.5   No results for input(s): LIPASE, AMYLASE in the last 168 hours. No results for input(s): AMMONIA in the last 168 hours. CBC:  Recent Labs Lab 03/31/15 1045 03/31/15 2029 04/01/15 0530 04/03/15 0521  WBC 7.0 6.5 7.1 5.6  NEUTROABS 4.8  --   --   --   HGB 9.1* 8.5* 8.7* 8.9*  HCT 29.9* 27.8* 28.0* 28.6*  MCV 96.1 92.7 93.3 93.2  PLT 258 256 247 268   Cardiac Enzymes:  Recent Labs Lab 03/31/15 1045 03/31/15 2029 04/01/15 0040 04/01/15 0530  TROPONINI <0.03 <0.03 0.03 <0.03   BNP (last 3 results)  Recent Labs  03/31/15 1045  BNP 915.6*    ProBNP (last 3 results) No results for input(s): PROBNP in the last 8760 hours.  CBG:  Recent Labs Lab 04/02/15 1222 04/02/15 1728 04/02/15 2221 04/03/15 0754 04/03/15 1208  GLUCAP 114* 90 98 97 88    Recent Results (from the past 240 hour(s))  MRSA PCR Screening     Status: None   Collection Time: 03/31/15  5:20 PM  Result Value Ref Range Status   MRSA by PCR NEGATIVE NEGATIVE Final    Comment:        The GeneXpert MRSA Assay (FDA approved for NASAL specimens only), is one component of a comprehensive MRSA colonization surveillance program. It is not intended to diagnose  MRSA infection nor to guide or monitor treatment for MRSA infections.      Studies: No results found.  Scheduled Meds: . atorvastatin  20 mg Oral q1800  . felodipine  10 mg Oral Daily  . ferrous sulfate  325 mg Oral BID WC  . furosemide  40 mg Intravenous Daily  . heparin  5,000 Units Subcutaneous 3 times per day  . insulin aspart  0-5 Units Subcutaneous QHS  . insulin aspart  0-9 Units Subcutaneous TID WC  . sodium chloride  3 mL Intravenous Q12H  . sodium chloride  3 mL Intravenous Q12H   Continuous Infusions:   Active Problems:   Acute on chronic diastolic CHF (congestive heart failure), NYHA class 3   CKD (chronic kidney disease), stage III   Diabetes   Abdominal abscess   Anemia, iron deficiency   Acute respiratory distress   Hypertensive urgency    Time spent: > 35 minutes    Penny PiaVEGA, Jennifer [name]  [facility] Pager [phone] If 7PM-7AM, please contact night-coverage at [email or web address], password [email or web address] 04/03/2015, 5:04 PM  LOS: 3 days

## 2015-04-03 NOTE — Progress Notes (Signed)
Made MD Cena BentonVega aware of patient's BP of 90/41. Orders received. Will continue to monitor.

## 2015-04-03 NOTE — Care Management Note (Signed)
    Page 1 of 1   04/04/2015     6:07:19 PM CARE MANAGEMENT NOTE 04/04/2015  Patient:  Jennifer [name],Jennifer [name]   Account Number:  [id number]  Date Initiated:  04/03/2015  Documentation initiated by:  Letha CapeAYLOR,Sriya Kroeze  Subjective/Objective Assessment:   dx acute resp distress  admiit- from home.     Action/Plan:   pt eval-hhpt   Anticipated DC Date:  04/04/2015   Anticipated DC Plan:  HOME W HOME HEALTH SERVICES      DC Planning Services  CM consult      [facility] / [facility]          [facility]  [facility]      Status of service:  Completed, signed off Medicare Important Message given?  YES (If response is "NO", the following Medicare IM given date fields will be blank) Date Medicare IM given:  04/03/2015 Medicare IM given by:  Letha CapeAYLOR,Myran Arcia Date Additional Medicare IM given:   Additional Medicare IM given by:    Discharge Disposition:  HOME/SELF CARE  Per UR Regulation:  Reviewed for med. necessity/level of care/duration of stay  If discussed at Long Length of Stay Meetings, dates discussed:    Comments:  04/04/15 1805 Letha Capeeborah Ejay Lashley RN, BSN [phone] patient dc , NCM contacted patient at home to try to set up hhpt, she states she has somone coming out to her home and she does not need anyone else coming out and hung up the phone.  04/03/15 1350 Letha Capeeborah Hasson Gaspard RN, BSN [phone] patient is from home, await pt eval.

## 2015-04-04 LAB — BASIC METABOLIC PANEL
Anion gap: 11 (ref 5–15)
BUN: 16 mg/dL (ref 6–20)
CALCIUM: 9.7 mg/dL (ref 8.9–10.3)
CO2: 25 mmol/L (ref 22–32)
Chloride: 105 mmol/L (ref 101–111)
Creatinine, Ser: 1.2 mg/dL — ABNORMAL HIGH (ref 0.44–1.00)
GFR calc Af Amer: 46 mL/min — ABNORMAL LOW (ref >60)
GFR, EST NON AFRICAN AMERICAN: 39 mL/min — AB (ref >60)
Glucose, Bld: 101 mg/dL — ABNORMAL HIGH (ref 70–99)
Potassium: 4.1 mmol/L (ref 3.5–5.1)
SODIUM: 141 mmol/L (ref 135–145)

## 2015-04-04 LAB — CBC
HCT: 31 % — ABNORMAL LOW (ref 36.0–46.0)
Hemoglobin: 9.6 g/dL — ABNORMAL LOW (ref 12.0–15.0)
MCH: 29 pg (ref 26.0–34.0)
MCHC: 31 g/dL (ref 30.0–36.0)
MCV: 93.7 fL (ref 78.0–100.0)
PLATELETS: 305 10*3/uL (ref 150–400)
RBC: 3.31 MIL/uL — ABNORMAL LOW (ref 3.87–5.11)
RDW: 16.9 % — ABNORMAL HIGH (ref 11.5–15.5)
WBC: 7.1 10*3/uL (ref 4.0–10.5)

## 2015-04-04 LAB — GLUCOSE, CAPILLARY
GLUCOSE-CAPILLARY: 135 mg/dL — AB (ref 70–99)
Glucose-Capillary: 99 mg/dL (ref 70–99)
Glucose-Capillary: 98 mg/dL (ref 70–99)

## 2015-04-04 MED ORDER — GLIPIZIDE ER 2.5 MG PO TB24: 2.5000 mg | ORAL_TABLET | Freq: Every day | ORAL | Status: DC

## 2015-04-04 MED ORDER — FUROSEMIDE 20 MG PO TABS: 20.0000 mg | ORAL_TABLET | Freq: Every day | ORAL | Status: DC

## 2015-04-04 MED ORDER — LABETALOL HCL 100 MG PO TABS: 100.0000 mg | ORAL_TABLET | Freq: Two times a day (BID) | ORAL | Status: AC

## 2015-04-04 NOTE — Discharge Summary (Signed)
Physician Discharge Summary  Jennifer [name] EAV:[id number] DOB: 01/29/1925 DOA: 03/31/2015  PCP: [name], KAREN, MD  Admit date: 03/31/2015 Discharge date: 04/04/2015  Time spent: > 35 minutes  Recommendations for Outpatient Follow-up:  1. Reassess serum creatinine 2. Will have metformin d/c'd due to elevated serum creatinine initially. Will be d/c'd on glipizide for blood sugar control please monitor blood sugars and adjust hypoglycemic agents accordingly 3. Beta blocker dose decreased  Discharge Diagnoses:  Active Problems:   Acute on chronic diastolic CHF (congestive heart failure), NYHA class 3   CKD (chronic kidney disease), stage III   Diabetes   Abdominal abscess   Anemia, iron deficiency   Acute respiratory distress   Hypertensive urgency   Discharge Condition: Stable  Diet recommendation: Diabetic diet  Filed Weights   03/31/15 1029 03/31/15 1712 04/02/15 0453  Weight: 60.328 kg (133 lb) 59.8 kg (131 lb 13.4 oz) 59.7 kg (131 lb 9.8 oz)    History of present illness:  According to history of present illness 81 y.o. female  With history of hypertension, congestive diastolic heart failure NYHA class III, diabetes on metformin, CK D stage III, history of thoracic aortic aneurysm with history of repair, and iron deficiency. Presents the hospital complaining of shortness of breath. Problem was described as persistent and gradually getting worse since onset. Patient states that within last 2 days she has had difficulty breathing. She had one bout prior to today's visit that resolved quickly per her husband. Today it was bad enough to where she presented to the hospital for further evaluation.   Hospital Course:  Acute diastolic CHF exacerbation - Improved with Lasix 40 mg IV daily - Will discharge on Lasix 20 mg by mouth daily - Beta blocker dose decreased  Diabetes mellitus - Patient has chronic kidney disease stage III and has had elevated serum creatinine. As such  metformin discontinued. Patient will be discharged on glipizide at low dose. Recommend PCP continue to monitor blood glucose levels and adjust hypoglycemic agents accordingly  Iron deficiency anemia -Continue home medication regimen  Procedures:  Echocardiogram: EF 55-60%  Consultations:  Grade 2 DD  Discharge Exam: Filed Vitals:   04/04/15 1505  BP: 133/53  Pulse: 76  Temp: 97.8 F (36.6 C)  Resp: 16    General: Pt in nad, alert and awake Cardiovascular: rrr, no rubs Respiratory: cta bl, no wheezes  Discharge Instructions   Discharge Instructions    Call MD for:  difficulty breathing, headache or visual disturbances    Complete by:  As directed      Call MD for:  persistant dizziness or light-headedness    Complete by:  As directed      Call MD for:  temperature >100.4    Complete by:  As directed      Diet - low sodium heart healthy    Complete by:  As directed      Discharge instructions    Complete by:  As directed   Please have your primary care physician monitor your blood sugars and adjust your hypoglycemic agent as necessary.  Your beta blocker was decreased. These have your primary care physician evaluate her blood pressure and adjust antihypertensives accordingly.  Metformin was discontinued secondary to your recent rise in serum creatinine. Glipizide will be prescribed in its place for management of your diabetes.     Increase activity slowly    Complete by:  As directed           Current Discharge  Medication List    START taking these medications   Details  furosemide (LASIX) 20 MG tablet Take 1 tablet (20 mg total) by mouth daily. Qty: 30 tablet, Refills: 0    glipiZIDE (GLIPIZIDE XL) 2.5 MG 24 hr tablet Take 1 tablet (2.5 mg total) by mouth daily with breakfast. Qty: 30 tablet, Refills: 0      CONTINUE these medications which have CHANGED   Details  labetalol (NORMODYNE) 100 MG tablet Take 1 tablet (100 mg total) by mouth 2 (two) times  daily. Qty: 60 tablet, Refills: 0      CONTINUE these medications which have NOT CHANGED   Details  atorvastatin (LIPITOR) 20 MG tablet Take 1 tablet (20 mg total) by mouth daily at 6 PM. Qty: 30 tablet, Refills: 11    calcium carbonate (OS-CAL) 600 MG TABS tablet Take 600 mg by mouth 2 (two) times daily with a meal.    ferrous sulfate 325 (65 FE) MG tablet Take 1 tablet (325 mg total) by mouth 2 (two) times daily with a meal. Qty: 60 tablet, Refills: 0    HYDROcodone-acetaminophen (NORCO/VICODIN) 5-325 MG per tablet Take 1-2 tablets by mouth every 4 (four) hours as needed for moderate pain. Qty: 20 tablet, Refills: 0    Nutritional Supplements (JUICE PLUS FIBRE PO) Take 12 tablets by mouth daily.    omeprazole (PRILOSEC) 40 MG capsule Take 40 mg by mouth daily. Refills: 2    Polyethyl Glycol-Propyl Glycol (SYSTANE OP) Place 1 drop into both eyes daily as needed (dry eyes).     Vitamin D, Ergocalciferol, (DRISDOL) 50000 UNITS CAPS capsule Take 50,000 Units by mouth every 7 (seven) days. Every saturday    gemfibrozil (LOPID) 600 MG tablet Take 600 mg by mouth 2 (two) times daily. Refills: 5      STOP taking these medications     cephALEXin (KEFLEX) 500 MG capsule      metFORMIN (GLUCOPHAGE) 500 MG tablet      naproxen sodium (ANAPROX) 220 MG tablet      fenofibrate 160 MG tablet      felodipine (PLENDIL) 10 MG 24 hr tablet        Allergies  Allergen Reactions  . Tape Other (See Comments)    "paper" tape please, adhesive tape "burns'      The results of significant diagnostics from this hospitalization (including imaging, microbiology, ancillary and laboratory) are listed below for reference.    Significant Diagnostic Studies: Dg Chest 2 View  03/31/2015   CLINICAL DATA:  Shortness of breath.  EXAM: CHEST  2 VIEW  COMPARISON:  10/31/2014  FINDINGS: Aortic arch and descending thoracic aortic stent noted.  Bilateral interstitial accentuation with Kerley B-lines.  Abnormal airspace opacity in the right suprahilar region. Mild atelectasis along both hemidiaphragms.  Mild blunting of the left costophrenic angle. Mild enlargement of the cardiopericardial silhouette  IMPRESSION: 1. Mild cardiomegaly with interstitial accentuation and Kerley B-lines compatible with acute pulmonary edema. There is asymmetric airspace edema or pneumonia in the right suprahilar region. 2. Mild bibasilar atelectasis. 3. Small left pleural effusion. 4. Aortic arch and descending thoracic aortic stent noted.   Electronically Signed   By: Gaylyn Rong M.D.   On: 03/31/2015 10:59    Microbiology: Recent Results (from the past 240 hour(s))  MRSA PCR Screening     Status: None   Collection Time: 03/31/15  5:20 PM  Result Value Ref Range Status   MRSA by PCR NEGATIVE NEGATIVE Final  Comment:        The GeneXpert MRSA Assay (FDA approved for NASAL specimens only), is one component of a comprehensive MRSA colonization surveillance program. It is not intended to diagnose MRSA infection nor to guide or monitor treatment for MRSA infections.      Labs: Basic Metabolic Panel:  Recent Labs Lab 03/31/15 1045 03/31/15 2029 04/01/15 0530 04/03/15 0521 04/04/15 0713  NA 138  --  140 141 141  K 4.3  --  4.2 3.3* 4.1  CL 109  --  106 106 105  CO2 24  --  GLUCOSE 124*  --  131* 95 101*  BUN 21  --  CREATININE 1.44* 1.56* 1.51* 1.28* 1.20*  CALCIUM 9.3  --  9.3 9.3 9.7  MG  --  1.8  --   --   --   PHOS  --  4.2  --   --   --    Liver Function Tests:  Recent Labs Lab 03/31/15 1045  AST 14  ALT 11  ALKPHOS 43  BILITOT 0.4  PROT 7.0  ALBUMIN 3.5   No results for input(s): LIPASE, AMYLASE in the last 168 hours. No results for input(s): AMMONIA in the last 168 hours. CBC:  Recent Labs Lab 03/31/15 1045 03/31/15 2029 04/01/15 0530 04/03/15 0521 04/04/15 0713  WBC 7.0 6.5 7.1 5.6 7.1  NEUTROABS 4.8  --   --   --   --   HGB 9.1* 8.5*  8.7* 8.9* 9.6*  HCT 29.9* 27.8* 28.0* 28.6* 31.0*  MCV 96.1 92.7 93.3 93.2 93.7  PLT 258 256 247 268 305   Cardiac Enzymes:  Recent Labs Lab 03/31/15 1045 03/31/15 2029 04/01/15 0040 04/01/15 0530  TROPONINI <0.03 <0.03 0.03 <0.03   BNP: BNP (last 3 results)  Recent Labs  03/31/15 1045  BNP 915.6*    ProBNP (last 3 results) No results for input(s): PROBNP in the last 8760 hours.  CBG:  Recent Labs Lab 04/03/15 1208 04/03/15 1705 04/03/15 2158 04/04/15 0823 04/04/15 1243  GLUCAP 88 135* 116* 99 98       Signed:  Penny Pia  [facility] 04/04/2015, 3:16 PM

## 2015-04-04 NOTE — Evaluation (Signed)
Physical Therapy Evaluation Patient Details Name: Jennifer [name] MRN: [id number] DOB: [birth year] Today's Date: 04/04/2015   History of Present Illness  Patient is an 81 y/o female with history of hypertension, congestive diastolic heart failure NYHA class III, diabetes on metformin, CK D stage III, history of thoracic aortic aneurysm with history of repair, and iron deficiency and recent abdominal wall abscess s/p I&D presented with SOB/acute respiratory distress.  Clinical Impression  Patient presents at supervision level for mobility.  She has been receiving HHPT services since last admission.  Feel she is safe for nursing to assist with mobility while in acute setting and for HHPT services to resume upon d/c.  Patient not interested in participating with PT while in acute setting.  Will sign off.    Follow Up Recommendations Home health PT    Equipment Recommendations  None recommended by PT    Recommendations for Other Services       Precautions / Restrictions Precautions Precautions: Fall      Mobility  Bed Mobility               General bed mobility comments: pt sitting on edge of bed eating breakfast  Transfers Overall transfer level: Needs assistance Equipment used: Rolling walker (2 wheeled) Transfers: Sit to/from Stand Sit to Stand: Supervision         General transfer comment: assist for safety due to hospital environment, no physical help needed  Ambulation/Gait Ambulation/Gait assistance: Supervision Ambulation Distance (Feet): 12 Feet Assistive device: Rolling walker (2 wheeled) Gait Pattern/deviations: Step-through pattern;Decreased stride length     General Gait Details: in room only due to pt reluctant to go out while on diuretic  Stairs            Wheelchair Mobility    Modified Rankin (Stroke Patients Only)       Balance Overall balance assessment: Needs assistance           Standing balance-Leahy Scale: Fair Standing  balance comment: can stand static without UE support, but needs walker for ambulation                             Pertinent Vitals/Pain Pain Assessment: No/denies pain    Home Living Family/patient expects to be discharged to:: Private residence Living Arrangements: Spouse/significant other Available Help at Discharge: Family;Available 24 hours/day Type of Home: House Home Access: Stairs to enter Entrance Stairs-Rails: None Entrance Stairs-Number of Steps: 3 Home Layout: One level Home Equipment: Environmental consultant - 2 wheels Additional Comments: Was getting HHPT, HHRN prior to this admission    Prior Function Level of Independence: Independent               Hand Dominance   Dominant Hand: Right    Extremity/Trunk Assessment               Lower Extremity Assessment: Overall WFL for tasks assessed         Communication   Communication: No difficulties  Cognition Arousal/Alertness: Awake/alert Behavior During Therapy: WFL for tasks assessed/performed Overall Cognitive Status: Within Functional Limits for tasks assessed                      General Comments      Exercises        Assessment/Plan    PT Assessment All further PT needs can be met in the next venue of care  PT Diagnosis Generalized weakness  PT Problem List Decreased strength;Decreased balance;Decreased activity tolerance  PT Treatment Interventions     PT Goals (Current goals can be found in the Care Plan section) Acute Rehab PT Goals PT Goal Formulation: All assessment and education complete, DC therapy    Frequency     Barriers to discharge        Co-evaluation               End of Session   Activity Tolerance: Patient tolerated treatment well Patient left: in chair;with call bell/phone within reach;with chair alarm set           Time: 0935-1000 PT Time Calculation (min) (ACUTE ONLY): 25 min   Charges:   PT Evaluation $Initial PT Evaluation Tier I:  1 Procedure PT Treatments $Gait Training: 8-22 mins   PT G CodesMagda Kiel 2015-04-07, 1:29 PM  Magda Kiel, [place] 04-07-2015   Physical Therapy Evaluation Patient Details Name: Jennifer [name] MRN: [id number] DOB: 23-Nov-1925 Today's Date: Apr 07, 2015   History of Present Illness  Patient is an 81 y/o female with history of hypertension, congestive diastolic heart failure NYHA class III, diabetes on metformin, CK D stage III, history of thoracic aortic aneurysm with history of repair, and iron deficiency and recent abdominal wall abscess s/p I&D presented with SOB/acute respiratory distress.  Clinical Impression  Patient presents close to functional baseline when at home previous to this admission.  She is also not interested in receiving PT during this hospitalization.  Feel she can mobilize safely with nursing assist and HHPT services can resume upon d/c.    Follow Up Recommendations Home health PT    Equipment Recommendations  None recommended by PT    Recommendations for Other Services       Precautions / Restrictions Precautions Precautions: Fall      Mobility  Bed Mobility               General bed mobility comments: pt sitting on edge of bed eating breakfast  Transfers Overall transfer level: Needs assistance Equipment used: Rolling walker (2 wheeled) Transfers: Sit to/from Stand Sit to Stand: Supervision         General transfer comment: assist for safety due to hospital environment, no physical help needed  Ambulation/Gait Ambulation/Gait assistance: Supervision Ambulation Distance (Feet): 12 Feet Assistive device: Rolling walker (2 wheeled) Gait Pattern/deviations: Step-through pattern;Decreased stride length     General Gait Details: in room only due to pt reluctant to go out while on diuretic  Stairs            Wheelchair Mobility    Modified Rankin (Stroke Patients Only)       Balance Overall balance assessment:  Needs assistance           Standing balance-Leahy Scale: Fair Standing balance comment: can stand static without UE support, but needs walker for ambulation                             Pertinent Vitals/Pain Pain Assessment: No/denies pain    Home Living Family/patient expects to be discharged to:: Private residence Living Arrangements: Spouse/significant other Available Help at Discharge: Family;Available 24 hours/day Type of Home: House Home Access: Stairs to enter Entrance Stairs-Rails: None Entrance Stairs-Number of Steps: 3 Home Layout: One level Home Equipment: Walker - 2 wheels Additional Comments: Was getting HHPT, HHRN prior to this admission    Prior Function  Level of Independence: Independent               Hand Dominance   Dominant Hand: Right    Extremity/Trunk Assessment               Lower Extremity Assessment: Overall WFL for tasks assessed         Communication   Communication: No difficulties  Cognition Arousal/Alertness: Awake/alert Behavior During Therapy: WFL for tasks assessed/performed Overall Cognitive Status: Within Functional Limits for tasks assessed                      General Comments      Exercises        Assessment/Plan    PT Assessment All further PT needs can be met in the next venue of care  PT Diagnosis Generalized weakness   PT Problem List Decreased strength;Decreased balance;Decreased activity tolerance  PT Treatment Interventions     PT Goals (Current goals can be found in the Care Plan section) Acute Rehab PT Goals PT Goal Formulation: All assessment and education complete, DC therapy    Frequency     Barriers to discharge        Co-evaluation               End of Session   Activity Tolerance: Patient tolerated treatment well Patient left: in chair;with call bell/phone within reach;with chair alarm set           Time: 0935-1000 PT Time Calculation (min)  (ACUTE ONLY): 25 min   Charges:   PT Evaluation $Initial PT Evaluation Tier I: 1 Procedure PT Treatments $Self Care/Home Management: 8-22   PT G Codes:        [id number],[id number] May 01, 2015, 1:31 PM Magda Kiel, [place] 2015/05/01

## 2015-04-24 ENCOUNTER — Inpatient Hospital Stay (HOSPITAL_COMMUNITY)
Admission: EM | Admit: 2015-04-24 | Discharge: 2015-04-29 | DRG: 378 | Disposition: A | Discharge status: Home Health | Payer: Medicare Other | Attending: Internal Medicine | Admitting: Internal Medicine

## 2015-04-24 ENCOUNTER — Encounter (HOSPITAL_COMMUNITY): Payer: Self-pay | Admitting: Emergency Medicine

## 2015-04-24 ENCOUNTER — Emergency Department (HOSPITAL_COMMUNITY): Payer: Medicare Other

## 2015-04-24 DIAGNOSIS — K5900 Constipation, unspecified: Secondary | ICD-10-CM | POA: Diagnosis present

## 2015-04-24 DIAGNOSIS — I10 Essential (primary) hypertension: Secondary | ICD-10-CM | POA: Diagnosis not present

## 2015-04-24 DIAGNOSIS — Z66 Do not resuscitate: Secondary | ICD-10-CM | POA: Diagnosis present

## 2015-04-24 DIAGNOSIS — K922 Gastrointestinal hemorrhage, unspecified: Secondary | ICD-10-CM | POA: Diagnosis not present

## 2015-04-24 DIAGNOSIS — D500 Iron deficiency anemia secondary to blood loss (chronic): Secondary | ICD-10-CM | POA: Diagnosis present

## 2015-04-24 DIAGNOSIS — E119 Type 2 diabetes mellitus without complications: Secondary | ICD-10-CM | POA: Diagnosis present

## 2015-04-24 DIAGNOSIS — D62 Acute posthemorrhagic anemia: Secondary | ICD-10-CM | POA: Diagnosis present

## 2015-04-24 DIAGNOSIS — Z79899 Other long term (current) drug therapy: Secondary | ICD-10-CM | POA: Diagnosis not present

## 2015-04-24 DIAGNOSIS — I5032 Chronic diastolic (congestive) heart failure: Secondary | ICD-10-CM | POA: Diagnosis present

## 2015-04-24 DIAGNOSIS — Z8679 Personal history of other diseases of the circulatory system: Secondary | ICD-10-CM | POA: Diagnosis not present

## 2015-04-24 DIAGNOSIS — N183 Chronic kidney disease, stage 3 unspecified: Secondary | ICD-10-CM | POA: Diagnosis present

## 2015-04-24 DIAGNOSIS — I129 Hypertensive chronic kidney disease with stage 1 through stage 4 chronic kidney disease, or unspecified chronic kidney disease: Secondary | ICD-10-CM | POA: Diagnosis present

## 2015-04-24 DIAGNOSIS — L8990 Pressure ulcer of unspecified site, unspecified stage: Secondary | ICD-10-CM | POA: Insufficient documentation

## 2015-04-24 DIAGNOSIS — I5022 Chronic systolic (congestive) heart failure: Secondary | ICD-10-CM | POA: Diagnosis not present

## 2015-04-24 DIAGNOSIS — I509 Heart failure, unspecified: Secondary | ICD-10-CM

## 2015-04-24 DIAGNOSIS — K5731 Diverticulosis of large intestine without perforation or abscess with bleeding: Principal | ICD-10-CM | POA: Diagnosis present

## 2015-04-24 DIAGNOSIS — K219 Gastro-esophageal reflux disease without esophagitis: Secondary | ICD-10-CM | POA: Diagnosis present

## 2015-04-24 DIAGNOSIS — Z8249 Family history of ischemic heart disease and other diseases of the circulatory system: Secondary | ICD-10-CM

## 2015-04-24 DIAGNOSIS — K625 Hemorrhage of anus and rectum: Secondary | ICD-10-CM | POA: Diagnosis present

## 2015-04-24 LAB — CBC WITH DIFFERENTIAL/PLATELET
Basophils Absolute: 0 10*3/uL (ref 0.0–0.1)
Basophils Relative: 0 % (ref 0–1)
EOS ABS: 0.3 10*3/uL (ref 0.0–0.7)
EOS PCT: 3 % (ref 0–5)
HEMATOCRIT: 24.5 % — AB (ref 36.0–46.0)
Hemoglobin: 7.6 g/dL — ABNORMAL LOW (ref 12.0–15.0)
LYMPHS ABS: 1.8 10*3/uL (ref 0.7–4.0)
LYMPHS PCT: 18 % (ref 12–46)
MCH: 28.9 pg (ref 26.0–34.0)
MCHC: 31 g/dL (ref 30.0–36.0)
MCV: 93.2 fL (ref 78.0–100.0)
MONO ABS: 0.7 10*3/uL (ref 0.1–1.0)
MONOS PCT: 8 % (ref 3–12)
NEUTROS ABS: 7 10*3/uL (ref 1.7–7.7)
Neutrophils Relative %: 71 % (ref 43–77)
Platelets: 250 10*3/uL (ref 150–400)
RBC: 2.63 MIL/uL — ABNORMAL LOW (ref 3.87–5.11)
RDW: 16.4 % — ABNORMAL HIGH (ref 11.5–15.5)
WBC: 9.8 10*3/uL (ref 4.0–10.5)

## 2015-04-24 LAB — HEMOGLOBIN
HEMOGLOBIN: 9 g/dL — AB (ref 12.0–15.0)
HEMOGLOBIN: 8.5 g/dL — AB (ref 12.0–15.0)

## 2015-04-24 LAB — I-STAT CHEM 8, ED
BUN: 23 mg/dL — ABNORMAL HIGH (ref 6–20)
CALCIUM ION: 1.25 mmol/L (ref 1.13–1.30)
CHLORIDE: 108 mmol/L (ref 101–111)
CREATININE: 1.4 mg/dL — AB (ref 0.44–1.00)
GLUCOSE: 123 mg/dL — AB (ref 65–99)
HCT: 24 % — ABNORMAL LOW (ref 36.0–46.0)
HEMOGLOBIN: 8.2 g/dL — AB (ref 12.0–15.0)
Potassium: 3.9 mmol/L (ref 3.5–5.1)
SODIUM: 141 mmol/L (ref 135–145)
TCO2: 20 mmol/L (ref 0–100)

## 2015-04-24 LAB — GLUCOSE, CAPILLARY
GLUCOSE-CAPILLARY: 86 mg/dL (ref 65–99)
GLUCOSE-CAPILLARY: 159 mg/dL — AB (ref 65–99)
GLUCOSE-CAPILLARY: 89 mg/dL (ref 65–99)

## 2015-04-24 LAB — COMPREHENSIVE METABOLIC PANEL
ALBUMIN: 2.9 g/dL — AB (ref 3.5–5.0)
ALT: 9 U/L — ABNORMAL LOW (ref 14–54)
AST: 12 U/L — ABNORMAL LOW (ref 15–41)
Alkaline Phosphatase: 47 U/L (ref 38–126)
Anion gap: 10 (ref 5–15)
BUN: 25 mg/dL — AB (ref 6–20)
CHLORIDE: 107 mmol/L (ref 101–111)
CO2: 23 mmol/L (ref 22–32)
CREATININE: 1.39 mg/dL — AB (ref 0.44–1.00)
Calcium: 9.2 mg/dL (ref 8.9–10.3)
GFR, EST AFRICAN AMERICAN: 38 mL/min — AB (ref >60)
GFR, EST NON AFRICAN AMERICAN: 33 mL/min — AB (ref >60)
Glucose, Bld: 120 mg/dL — ABNORMAL HIGH (ref 65–99)
Potassium: 3.9 mmol/L (ref 3.5–5.1)
Sodium: 140 mmol/L (ref 135–145)
Total Bilirubin: 0.4 mg/dL (ref 0.3–1.2)
Total Protein: 6.1 g/dL — ABNORMAL LOW (ref 6.5–8.1)

## 2015-04-24 LAB — HEMATOCRIT: HCT: 28.9 % — ABNORMAL LOW (ref 36.0–46.0)

## 2015-04-24 LAB — I-STAT CG4 LACTIC ACID, ED: LACTIC ACID, VENOUS: 1.22 mmol/L (ref 0.5–2.0)

## 2015-04-24 LAB — ABO/RH: ABO/RH(D): O POS

## 2015-04-24 LAB — PROTIME-INR
INR: 1.17 (ref 0.00–1.49)
Prothrombin Time: 15.1 seconds (ref 11.6–15.2)

## 2015-04-24 LAB — MRSA PCR SCREENING: MRSA by PCR: NEGATIVE

## 2015-04-24 LAB — PREPARE RBC (CROSSMATCH)

## 2015-04-24 MED ORDER — ONDANSETRON HCL 4 MG/2ML IJ SOLN: 4.0000 mg | Freq: Four times a day (QID) | INTRAMUSCULAR | Status: DC | PRN

## 2015-04-24 MED ORDER — ACETAMINOPHEN 325 MG PO TABS
650.0000 mg | ORAL_TABLET | Freq: Four times a day (QID) | ORAL | Status: DC | PRN
Start: 2015-04-24 — End: 2015-04-29
  Administered 2015-04-28: 650 mg via ORAL
  Filled 2015-04-24: qty 2

## 2015-04-24 MED ORDER — ACETAMINOPHEN 650 MG RE SUPP
650.0000 mg | Freq: Four times a day (QID) | RECTAL | Status: DC | PRN
Start: 2015-04-24 — End: 2015-04-29

## 2015-04-24 MED ORDER — ATORVASTATIN CALCIUM 20 MG PO TABS
20.0000 mg | ORAL_TABLET | Freq: Every day | ORAL | Status: DC
  Administered 2015-04-24 – 2015-04-28 (×5): 20 mg via ORAL
  Filled 2015-04-24: qty 2
  Filled 2015-04-24 (×2): qty 1
  Filled 2015-04-24: qty 2
  Filled 2015-04-24: qty 1
  Filled 2015-04-24: qty 2

## 2015-04-24 MED ORDER — INSULIN ASPART 100 UNIT/ML ~~LOC~~ SOLN
0.0000 [IU] | Freq: Three times a day (TID) | SUBCUTANEOUS | Status: DC
  Administered 2015-04-24: 2 [IU] via SUBCUTANEOUS

## 2015-04-24 MED ORDER — FUROSEMIDE 20 MG PO TABS
20.0000 mg | ORAL_TABLET | Freq: Every day | ORAL | Status: DC
  Administered 2015-04-24 – 2015-04-29 (×6): 20 mg via ORAL
  Filled 2015-04-24 (×6): qty 1

## 2015-04-24 MED ORDER — PANTOPRAZOLE SODIUM 40 MG IV SOLR
40.0000 mg | Freq: Two times a day (BID) | INTRAVENOUS | Status: DC
  Administered 2015-04-24 – 2015-04-26 (×5): 40 mg via INTRAVENOUS
  Filled 2015-04-24 (×5): qty 40

## 2015-04-24 MED ORDER — ONDANSETRON HCL 4 MG PO TABS: 4.0000 mg | ORAL_TABLET | Freq: Four times a day (QID) | ORAL | Status: DC | PRN

## 2015-04-24 MED ORDER — SODIUM CHLORIDE 0.9 % IV SOLN
Freq: Once | INTRAVENOUS | Status: AC
  Administered 2015-04-24: 07:00:00 via INTRAVENOUS

## 2015-04-24 NOTE — Consult Note (Signed)
Reason for Consult: Lower GI bleeding referring Physician: Hospital team  Jennifer [name] is an 81 y.o. female.  HPI: Patient known to me from previous GI workup and her hospital computer chart and office computer chart was reviewed and her last colonoscopy was 6 years ago with a few small polyps and lots of diverticuli who has had 3 admissions this year including a abdominal wall abscess but she's never had GI bleeding before which started last night and seems to be bright red and clots and had some crampiness but no pain and no fever and has not been on any aspirin or blood thinners and other than some mild constipation had been moving her bowels okay lately and has no new complaints  Past Medical History  Diagnosis Date  . Diabetes mellitus without complication   . Hypertension   . AAA (abdominal aortic aneurysm)-repaired   . Irregular heart beat   . Acute bronchitis   . Tachycardia     Unspecified  . Shortness of breath dyspnea   . GERD (gastroesophageal reflux disease)     Past Surgical History  Procedure Laterality Date  . Abdominal hysterectomy      40 years ago, intestinal damage  . Abdominal aortic aneurysm repair  2012    Dr. Donnetta Hutching  . Breast lumpectomy Left     Benign Mass  . Thoracic aortic endovascular stent graft N/A 09/27/2014    Procedure: THORACIC AORTIC ENDOVASCULAR STENT GRAFT;  Surgeon: Serafina Mitchell, MD;  Location: [facility];  Service: Vascular;  Laterality: N/A;  . Oophorectomy      40 years ago  . Incision and drainage abscess N/A 02/28/2015    Procedure: INCISION AND DRAINAGE ABSCESS ABDOMINAL WALL;  Surgeon: Fanny Skates, MD;  Location: [facility] OR;  Service: General;  Laterality: N/A;    Family History  Problem Relation Age of Onset  . Heart disease Sister     Social History:  reports that she has never smoked. She has never used smokeless tobacco. She reports that she does not drink alcohol or use illicit drugs.  Allergies:  Allergies  Allergen Reactions   . Sulfur     Unknown reaction   . Tape Rash and Other (See Comments)    "paper" tape please, adhesive tape "burns'    Medications: I have reviewed the patient's current medications.  Results for orders placed or performed during the hospital encounter of 04/24/15 (from the past 48 hour(s))  Comprehensive metabolic panel     Status: Abnormal   Collection Time: 04/24/15  6:00 AM  Result Value Ref Range   Sodium 140 135 - 145 mmol/L   Potassium 3.9 3.5 - 5.1 mmol/L   Chloride 107 101 - 111 mmol/L   CO2 23 22 - 32 mmol/L   Glucose, Bld 120 (H) 65 - 99 mg/dL   BUN 25 (H) 6 - 20 mg/dL   Creatinine, Ser 1.39 (H) 0.44 - 1.00 mg/dL   Calcium 9.2 8.9 - 10.3 mg/dL   Total Protein 6.1 (L) 6.5 - 8.1 g/dL   Albumin 2.9 (L) 3.5 - 5.0 g/dL   AST 12 (L) 15 - 41 U/L   ALT 9 (L) 14 - 54 U/L   Alkaline Phosphatase 47 38 - 126 U/L   Total Bilirubin 0.4 0.3 - 1.2 mg/dL   GFR calc non Af Amer 33 (L) >60 mL/min   GFR calc Af Amer 38 (L) >60 mL/min    Comment: (NOTE) The eGFR has been calculated using  the CKD EPI equation. This calculation has not been validated in all clinical situations. eGFR's persistently <60 mL/min signify possible Chronic Kidney Disease.    Anion gap 10 5 - 15  CBC WITH DIFFERENTIAL     Status: Abnormal   Collection Time: 04/24/15  6:00 AM  Result Value Ref Range   WBC 9.8 4.0 - 10.5 K/uL   RBC 2.63 (L) 3.87 - 5.11 MIL/uL   Hemoglobin 7.6 (L) 12.0 - 15.0 g/dL   HCT 24.5 (L) 36.0 - 46.0 %   MCV 93.2 78.0 - 100.0 fL   MCH 28.9 26.0 - 34.0 pg   MCHC 31.0 30.0 - 36.0 g/dL   RDW 16.4 (H) 11.5 - 15.5 %   Platelets 250 150 - 400 K/uL   Neutrophils Relative % 71 43 - 77 %   Neutro Abs 7.0 1.7 - 7.7 K/uL   Lymphocytes Relative 18 12 - 46 %   Lymphs Abs 1.8 0.7 - 4.0 K/uL   Monocytes Relative 8 3 - 12 %   Monocytes Absolute 0.7 0.1 - 1.0 K/uL   Eosinophils Relative 3 0 - 5 %   Eosinophils Absolute 0.3 0.0 - 0.7 K/uL   Basophils Relative 0 0 - 1 %   Basophils Absolute 0.0  0.0 - 0.1 K/uL  Type and screen for Red Blood Exchange     Status: None (Preliminary result)   Collection Time: 04/24/15  6:00 AM  Result Value Ref Range   ABO/RH(D) O POS    Antibody Screen NEG    Sample Expiration 04/27/2015    Unit Number [id number]    Blood Component Type RED CELLS,LR    Unit division 00    Status of Unit ISSUED    Transfusion Status OK TO TRANSFUSE    Crossmatch Result Compatible    Unit Number [id number]    Blood Component Type RBC LR PHER2    Unit division 00    Status of Unit ALLOCATED    Transfusion Status OK TO TRANSFUSE    Crossmatch Result Compatible   Protime-INR     Status: None   Collection Time: 04/24/15  6:00 AM  Result Value Ref Range   Prothrombin Time 15.1 11.6 - 15.2 seconds   INR 1.17 0.00 - 1.49  ABO/Rh     Status: None   Collection Time: 04/24/15  6:01 AM  Result Value Ref Range   ABO/RH(D) O POS   I-Stat CG4 Lactic Acid, ED  (not at [facility])     Status: None   Collection Time: 04/24/15  6:03 AM  Result Value Ref Range   Lactic Acid, Venous 1.22 0.5 - 2.0 mmol/L  I-Stat Chem 8, ED  (not at [facility])     Status: Abnormal   Collection Time: 04/24/15  6:04 AM  Result Value Ref Range   Sodium 141 135 - 145 mmol/L   Potassium 3.9 3.5 - 5.1 mmol/L   Chloride 108 101 - 111 mmol/L   BUN 23 (H) 6 - 20 mg/dL   Creatinine, Ser 1.40 (H) 0.44 - 1.00 mg/dL   Glucose, Bld 123 (H) 65 - 99 mg/dL   Calcium, Ion 1.25 1.13 - 1.30 mmol/L   TCO2 20 0 - 100 mmol/L   Hemoglobin 8.2 (L) 12.0 - 15.0 g/dL   HCT 24.0 (L) 36.0 - 46.0 %  Prepare RBC     Status: None   Collection Time: 04/24/15  6:17 AM  Result Value Ref Range   Order Confirmation ORDER  PROCESSED BY BLOOD BANK     Ct Abdomen Pelvis Wo Contrast  04/24/2015   CLINICAL DATA:  Bright red blood per rectum  EXAM: CT ABDOMEN AND PELVIS WITHOUT CONTRAST  TECHNIQUE: Multidetector CT imaging of the abdomen and pelvis was performed following the standard protocol without oral or intravenous  contrast material administration.  COMPARISON:  February 28, 2015  FINDINGS: There is scarring in the anterior left lung base region. There are multiple foci of coronary artery calcification. Heart is mildly enlarged.  The previously noted distal thoracic aortic endograft appears stable compared to recent prior study. There is no surrounding fluid.  There is a stable 1 cm cyst in the anterior segment right lobe of the liver. No new liver lesions are identified on this noncontrast enhanced study. Gallbladder wall is not thickened. There is no biliary duct dilatation.  Spleen, pancreas, and left adrenal appear normal. There is a stable adenoma in the right adrenal gland measuring 1.2 x 0.9 cm.  There are several 1 cm or less in size foci of increased attenuation in each kidney, upright representing small hyperdense cysts given the appearance on recent CT. There is a cyst arising from the lower pole the left kidney measuring 2.9 by 2.9 cm. There is a calculus in the mid to lower pole left kidney measuring 5 x 5 mm, nonobstructing. There is no hydronephrosis on either side. 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 There is no ureteral calculus on either side.  In the pelvis, the urinary bladder is midline with normal wall thickness. There is mixed attenuation material within the rectum without well-defined mass by noncontrast enhanced CT. There are multiple sigmoid diverticula without diverticulitis. There is a stable left ovarian/ adnexal cyst measuring 2.5 x 1.4 cm. No other pelvic mass identified. There is no free pelvic fluid. Uterus is absent.  There is again noted abdominal aortic tortuosity/ectasia with extensive calcification in the aorta and iliac arteries. This appearance is stable. Mild dilatation of the proximal celiac artery is likewise stable.  Compared to the previous study, there has been resolution of rectus muscle inflammatory change. There is some mild  thickening of the rectus muscle in the midline at the level of the upper pelvis measuring 2.3 x 0.8 cm. A second area of rectus muscle thickening is seen on the left in the upper pelvic region measuring 2.6 x 1.3 cm. There is no surrounding inflammation. This area may represent a small amount of rectus muscle hematoma. This finding on the left was not present on the previous study. This site is near a defect along the left pelvic wall, likely of postoperative etiology.  There is no bowel obstruction. No free air or portal venous air. There are scattered diverticula throughout the colon. There is no intra-abdominal ascites, adenopathy, or abscess in the abdomen or pelvis. There is degenerative change throughout the lumbar spine. There is lumbar levoscoliosis. There are no blastic or lytic bone lesions. There is spinal stenosis at L3-4 L4-5, due to bony hypertrophy and disc protrusion.  IMPRESSION: The previously noted rectus muscle abscesses have resolved. There is postoperative change along the left upper pelvic abdominal wall. There are small apparent rectus muscle hematoma is midline and on the left in the upper pelvic region without surrounding inflammation.  Mixed attenuation material within the rectum. A degree of hemorrhage in this area is questioned. A well-defined mass is not seen. There is no perirectal inflammatory change seen on  this study.  Stable left adnexal region cyst.  No new pelvic mass.  Extensive colonic diverticulosis without diverticulitis.  Multiple presumed hyperdense cysts in the kidneys. Nonobstructing calculus left kidney. Subcentimeter left peripheral renal artery aneurysm.  Extensive aortic tortuosity and calcification. Stable endograft in the distal thoracic aorta.  Stable benign-appearing mass right adrenal.  No bowel obstruction.  No abscess.  No ascites.   Electronically Signed   By: Lowella Grip III M.D.   On: 04/24/2015 07:21    ROS negative except above Blood pressure  171/71, pulse 76, temperature 98.3 F (36.8 C), temperature source Oral, resp. rate 14, SpO2 99 %. Physical Exam vital signs stable afebrile no acute distress lungs are clear regular rate and rhythm abdomen is soft nontender good bowel sounds labs reviewed hemoglobin decreased from previous hospital ones BUN increased minimally CT reviewed  Assessment/Plan: Lower GI bleeding probably diverticuli Plan: Clear liquid diet okay consider nuclear bleeding scan if bleeding increases otherwise supportive care transfusion etc. as you are doing and will follow with you  Terilynn Buresh E 04/24/2015, 11:04 AM

## 2015-04-24 NOTE — H&P (Signed)
PCP:   [name], KAREN, MD   Chief Complaint:  Rectal bleeding  HPI: 81 year old female who  has a past medical history of Diabetes mellitus without complication; Hypertension; AAA (abdominal aortic aneurysm)-repaired; Irregular heart beat; Acute bronchitis; Tachycardia; Shortness of breath dyspnea; and GERD (gastroesophageal reflux disease). Presents to the ED with chief complaint of rectal bleeding which started around 1 AM last night. Patient also had pain in the left lower quadrant which has now resolved. She denies nausea and vomiting. Patient has been followed by either GI and had a colonoscopy done by Dr. Ewing SchleinMagod several years ago. In April patient underwent debridement of rectus/abdominal wall abscess as per surgery. Patient has been passing blood clots since 1 AM last night. In the ED patient was found to have anemia with hemoglobin 8.2. 2 units of blood transfusion has been ordered. Patient'[name] vitals are stable She denies chest pain, no shortness of breath no fever. No nausea vomiting or diarrhea. Patient did not pass out, no headache no blurred vision.  Allergies:   Allergies  Allergen Reactions  . Sulfur     Unknown reaction   . Tape Rash and Other (See Comments)    "paper" tape please, adhesive tape "burns'      Past Medical History  Diagnosis Date  . Diabetes mellitus without complication   . Hypertension   . AAA (abdominal aortic aneurysm)-repaired   . Irregular heart beat   . Acute bronchitis   . Tachycardia     Unspecified  . Shortness of breath dyspnea   . GERD (gastroesophageal reflux disease)     Past Surgical History  Procedure Laterality Date  . Abdominal hysterectomy      40 years ago, intestinal damage  . Abdominal aortic aneurysm repair  2012    Dr. Arbie CookeyEarly  . Breast lumpectomy Left     Benign Mass  . Thoracic aortic endovascular stent graft N/A 09/27/2014    Procedure: THORACIC AORTIC ENDOVASCULAR STENT GRAFT;  Surgeon: Nada LibmanVance W Brabham, MD;   [facility];  Service: Vascular;  Laterality: N/A;  . Oophorectomy      40 years ago  . Incision and drainage abscess N/A 02/28/2015    Procedure: INCISION AND DRAINAGE ABSCESS ABDOMINAL WALL;  Surgeon: Claud KelpHaywood Ingram, MD;  Location: [facility] OR;  Service: General;  Laterality: N/A;    Prior to Admission medications   Medication Sig Start Date End Date Taking? Authorizing Provider  atorvastatin (LIPITOR) 20 MG tablet Take 1 tablet (20 mg total) by mouth daily at 6 PM. 09/12/14  Yes Rhonda G Barrett, PA-C  calcium carbonate (OS-CAL) 600 MG TABS tablet Take 600 mg by mouth 2 (two) times daily with a meal. Takes in the morning and at lunch time   Yes Historical Provider, MD  ferrous sulfate 325 (65 FE) MG tablet Take 1 tablet (325 mg total) by mouth 2 (two) times daily with a meal. 03/04/15  Yes Catarina Hartshornavid Tat, MD  furosemide (LASIX) 20 MG tablet Take 1 tablet (20 mg total) by mouth daily. Patient taking differently: Take 20 mg by mouth 2 (two) times daily.  04/04/15  Yes Penny Piarlando Vega, MD  gemfibrozil (LOPID) 600 MG tablet Take 600 mg by mouth 2 (two) times daily. 01/12/15  Yes Historical Provider, MD  glipiZIDE (GLIPIZIDE XL) 2.5 MG 24 hr tablet Take 1 tablet (2.5 mg total) by mouth daily with breakfast. 04/04/15  Yes Penny Piarlando Vega, MD  labetalol (NORMODYNE) 100 MG tablet Take 1 tablet (100 mg total) by mouth  2 (two) times daily. 04/04/15  Yes Penny Pia, MD  Nutritional Supplements (JUICE PLUS FIBRE PO) Take 12 tablets by mouth every evening.    Yes Historical Provider, MD  omeprazole (PRILOSEC) 40 MG capsule Take 40 mg by mouth daily. 03/17/15  Yes Historical Provider, MD  Polyethyl Glycol-Propyl Glycol (SYSTANE OP) Place 1 drop into both eyes 2 (two) times daily.    Yes Historical Provider, MD  Vitamin D, Ergocalciferol, (DRISDOL) 50000 UNITS CAPS capsule Take 50,000 Units by mouth every Saturday.    Yes Historical Provider, MD  HYDROcodone-acetaminophen (NORCO/VICODIN) 5-325 MG per tablet Take 1-2 tablets by  mouth every 4 (four) hours as needed for moderate pain. Patient not taking: Reported on 04/24/2015 03/04/15   Catarina Hartshorn, MD    Social History:  reports that she has never smoked. She has never used smokeless tobacco. She reports that she does not drink alcohol or use illicit drugs.  Family History  Problem Relation Age of Onset  . Heart disease Sister        Review of Systems:  As in the history of present illness, rest of review of systems negative   Physical Exam: Blood pressure 171/71, pulse 76, temperature 98 F (36.7 C), temperature source Oral, resp. rate 14, SpO2 99 %. Constitutional:   Patient is a well-developed and well-nourished *female in no acute distress and cooperative with exam. Head: Normocephalic and atraumatic Mouth: Mucus membranes moist Eyes: PERRL, EOMI, conjunctivae normal Neck: Supple, No Thyromegaly Cardiovascular: RRR, S1 normal, S2 normal Pulmonary/Chest: CTAB, no wheezes, rales, or rhonchi Abdominal: Soft. Mild tenderness in the left lower quadrant on palpation, non-distended, bowel sounds are normal, no masses, organomegaly, or guarding present.  Neurological: A&O x3, Strength is normal and symmetric bilaterally, cranial nerve II-XII are grossly intact, no focal motor deficit, sensory intact to light touch bilaterally.  Extremities : No Cyanosis, Clubbing or Edema  Labs on Admission:  Basic Metabolic Panel:  Recent Labs Lab 04/24/15 0600 04/24/15 0604  NA 140 141  K 3.9 3.9  CL 107 108  CO2 23  --   GLUCOSE 120* 123*  BUN 25* 23*  CREATININE 1.39* 1.40*  CALCIUM 9.2  --    Liver Function Tests:  Recent Labs Lab 04/24/15 0600  AST 12*  ALT 9*  ALKPHOS 47  BILITOT 0.4  PROT 6.1*  ALBUMIN 2.9*   CBC:  Recent Labs Lab 04/24/15 0600 04/24/15 0604  WBC 9.8  --   NEUTROABS 7.0  --   HGB 7.6* 8.2*  HCT 24.5* 24.0*  MCV 93.2  --   PLT 250  --    Cardiac Enzymes: No results for input([name]): CKTOTAL, CKMB, CKMBINDEX, TROPONINI in  the last 168 hours.  BNP (last 3 results)  Recent Labs  03/31/15 1045  BNP 915.6*    Radiological Exams on Admission: Ct Abdomen Pelvis Wo Contrast  IMPRESSION: The previously noted rectus muscle abscesses have resolved. There is postoperative change along the left upper pelvic abdominal wall. There are small apparent rectus muscle hematoma is midline and on the left in the upper pelvic region without surrounding inflammation.  Mixed attenuation material within the rectum. A degree of hemorrhage in this area is questioned. A well-defined mass is not seen. There is no perirectal inflammatory change seen on this study.  Stable left adnexal region cyst.  No new pelvic mass.  Extensive colonic diverticulosis without diverticulitis.  Multiple presumed hyperdense cysts in the kidneys. Nonobstructing calculus left kidney. Subcentimeter left peripheral renal artery  aneurysm.  Extensive aortic tortuosity and calcification. Stable endograft in the distal thoracic aorta.  Stable benign-appearing mass right adrenal.  No bowel obstruction.  No abscess.  No ascites.   Electronically Signed   By: Bretta Bang III M.D.   On: 04/24/2015 07:21    EKG: Independently reviewed. Normal sinus rhythm   Assessment/Plan Active Problems:   Lower GI bleed   GI bleed   Diabetes mellitus   Congestive heart failure  Lower GI bleed Patient presenting with hematochezia, will transfuse 2 units of RBC. Vitals are stable. GI equal has been consulted by the ED physician, Dr Ewing Schlein to see the patient today.  Anemia Hemoglobin was 8.2, will transfuse 2 units PRBC. Follow H&H q6 hours as patient has ongoing bleed.  Diabetes mellitus Initiate sliding scale insulin with NovoLog  Congestive heart failure Patient takes Lasix 20 g twice a day. Will and tinnitus with Lasix as patient is getting blood transfusion.  Hypertension Will hold labetalol at this time due to ongoing GI bleed. Blood pressure is stable  DVT  prophylaxis SCDs  Code status: DO NOT RESUSCITATE  Family discussion: No family at bedside   Time Spent on Admission: 60 min  Jennifer [name] [facility] Pager: [phone] 04/24/2015, 9:18 AM  If 7PM-7AM, please contact night-coverage  [email or web address]  Password [email or web address]

## 2015-04-24 NOTE — ED Notes (Signed)
Assumed care of patient from Jennifer BeachA. Marquez, RN.  Patient resting comfortably with husband at bedside.  Patient requested and received ice chips.

## 2015-04-24 NOTE — ED Notes (Signed)
Brought in by EMS from home with c/o rectal bleeding.  Pt reports that she started having "bright red bleeding with clots" at around 0100.  Pt also reports that she has used "5 to 6 pads", saturated with blood and additional 2 depends.  Pt has had no hx GI bleeding but just several hemorrhoids but "not having this heavy bleeding".  Pt has had recent abdominal surgery (a month ago)--- site still unhealed.  Pt is not on blood thinners but is currently taking iron supplement.

## 2015-04-24 NOTE — ED Notes (Signed)
Bed: [id number] Expected date:  Expected time:  Means of arrival:  Comments: EMS 43F GI Bleed

## 2015-04-24 NOTE — Plan of Care (Addendum)
Problem: Consults Goal: Nutrition Consult-if indicated Outcome: Progressing Diet changed to Clear liquid, pt happy to be able to drink. Initially no bleeding noted but after clears, noted small amount jellied maroon stool in commode.   Continued close monitoring.  Problem: Phase I Progression Outcomes Goal: OOB as tolerated unless otherwise ordered Outcome: Progressing OOB to chair to have lunch. Goal: Other Phase I Outcomes/Goals Outcome: Progressing Pt. received 2 units of PRBC's.  Problem: Phase II Progression Outcomes Goal: No active bleeding Outcome: Progressing Continued close monitoring.

## 2015-04-24 NOTE — Plan of Care (Addendum)
Problem: Phase II Progression Outcomes Goal: No active bleeding Pt had 3 moderate sized maroon stools within 2 hrs time  On-call MD, Dr. Evette CristalGanem notified.  Pt hemodynamically stable at this time, continued close monitoring.

## 2015-04-24 NOTE — ED Provider Notes (Signed)
CSN: [id number]     Arrival date & time 04/24/15  0531 History   First MD Initiated Contact with Patient 04/24/15 0544     Chief Complaint  Patient presents with  . Rectal Bleeding     (Consider location/radiation/quality/duration/timing/severity/associated sxs/prior Treatment) HPI 81 year old female presents to the emergency department via EMS from home with complaint of rectal bleeding.  Symptoms started this morning around 1 AM.  She reports initially she had some pain to the left lower quadrant and lower mid abdomen just since resolved.  No prior history of GI bleeding.  She denies history of diverticulosis.  She reports colonoscopy done by Dr. Ewing Schlein several years ago.  Patient has past medical history of diabetes, hypertension, AAA status post repair in 2012, and thoracic aortic stent graft in 2015.  Patient recently had surgery for a infection in her abdominal wall involving the fascia.  This is been healing well and she is decreasing the amount of dressing changes.  No fevers or chills Past Medical History  Diagnosis Date  . Diabetes mellitus without complication   . Hypertension   . AAA (abdominal aortic aneurysm)-repaired   . Irregular heart beat   . Acute bronchitis   . Tachycardia     Unspecified  . Shortness of breath dyspnea   . GERD (gastroesophageal reflux disease)    Past Surgical History  Procedure Laterality Date  . Abdominal hysterectomy      40 years ago, intestinal damage  . Abdominal aortic aneurysm repair  2012    Dr. Arbie Cookey  . Breast lumpectomy Left     Benign Mass  . Thoracic aortic endovascular stent graft N/A 09/27/2014    Procedure: THORACIC AORTIC ENDOVASCULAR STENT GRAFT;  Surgeon: Nada Libman, MD;  Location: [facility] OR;  Service: Vascular;  Laterality: N/A;  . Oophorectomy      40 years ago  . Incision and drainage abscess N/A 02/28/2015    Procedure: INCISION AND DRAINAGE ABSCESS ABDOMINAL WALL;  Surgeon: Claud Kelp, MD;  Location: [facility] OR;   Service: General;  Laterality: N/A;   Family History  Problem Relation Age of Onset  . Heart disease Sister    History  Substance Use Topics  . Smoking status: Never Smoker   . Smokeless tobacco: Never Used     Comment: quit smoking in 1975  . Alcohol Use: No   OB History    No data available     Review of Systems  Gastrointestinal: Positive for blood in stool.      Allergies  Tape  Home Medications   Prior to Admission medications   Medication Sig Start Date End Date Taking? Authorizing Provider  atorvastatin (LIPITOR) 20 MG tablet Take 1 tablet (20 mg total) by mouth daily at 6 PM. 09/12/14   Rhonda G Barrett, PA-C  calcium carbonate (OS-CAL) 600 MG TABS tablet Take 600 mg by mouth 2 (two) times daily with a meal.    Historical Provider, MD  ferrous sulfate 325 (65 FE) MG tablet Take 1 tablet (325 mg total) by mouth 2 (two) times daily with a meal. 03/04/15   Catarina Hartshorn, MD  furosemide (LASIX) 20 MG tablet Take 1 tablet (20 mg total) by mouth daily. 04/04/15   Penny Pia, MD  gemfibrozil (LOPID) 600 MG tablet Take 600 mg by mouth 2 (two) times daily. 01/12/15   Historical Provider, MD  glipiZIDE (GLIPIZIDE XL) 2.5 MG 24 hr tablet Take 1 tablet (2.5 mg total) by mouth daily with breakfast. 04/04/15  Penny Piarlando Vega, MD  HYDROcodone-acetaminophen (NORCO/VICODIN) 5-325 MG per tablet Take 1-2 tablets by mouth every 4 (four) hours as needed for moderate pain. 03/04/15   Catarina Hartshornavid Tat, MD  labetalol (NORMODYNE) 100 MG tablet Take 1 tablet (100 mg total) by mouth 2 (two) times daily. 04/04/15   Penny Piarlando Vega, MD  Nutritional Supplements (JUICE PLUS FIBRE PO) Take 12 tablets by mouth daily.    Historical Provider, MD  omeprazole (PRILOSEC) 40 MG capsule Take 40 mg by mouth daily. 03/17/15   Historical Provider, MD  Polyethyl Glycol-Propyl Glycol (SYSTANE OP) Place 1 drop into both eyes daily as needed (dry eyes).     Historical Provider, MD  Vitamin D, Ergocalciferol, (DRISDOL) 50000 UNITS CAPS  capsule Take 50,000 Units by mouth every 7 (seven) days. Every saturday    Historical Provider, MD   There were no vitals taken for this visit. Physical Exam  Constitutional: She is oriented to person, place, and time. She appears well-developed and well-nourished.  HENT:  Head: Normocephalic and atraumatic.  Nose: Nose normal.  Mouth/Throat: Oropharynx is clear and moist.  Eyes: Conjunctivae and EOM are normal. Pupils are equal, round, and reactive to light.  Neck: Normal range of motion. Neck supple. No JVD present. No tracheal deviation present. No thyromegaly present.  Cardiovascular: Normal rate, regular rhythm, normal heart sounds and intact distal pulses.  Exam reveals no gallop and no friction rub.   No murmur heard. Pulmonary/Chest: Effort normal and breath sounds normal. No stridor. No respiratory distress. She has no wheezes. She has no rales. She exhibits no tenderness.  Abdominal: Soft. Bowel sounds are normal. She exhibits no distension and no mass. There is no tenderness. There is no rebound and no guarding.  Abdominal wall wound without signs of infection.  Granulation tissue.  No surrounding cellulitis or drainage.  Genitourinary:  Patient has a large amount of bright red to dark blood mixed with clots in the diaper.  No thrombosed hemorrhoid or external source of bleeding.  Rectal exam shows bright red blood in the rectum  Musculoskeletal: Normal range of motion. She exhibits no edema or tenderness.  Lymphadenopathy:    She has no cervical adenopathy.  Neurological: She is alert and oriented to person, place, and time. She displays normal reflexes. She exhibits normal muscle tone. Coordination normal.  Skin: Skin is warm and dry. No rash noted. No erythema. No pallor.  Psychiatric: She has a normal mood and affect. Her behavior is normal. Judgment and thought content normal.  Nursing note and vitals reviewed.   ED Course  Procedures (including critical care time) Labs  Review Labs Reviewed  COMPREHENSIVE METABOLIC PANEL - Abnormal; Notable for the following:    Glucose, Bld 120 (*)    BUN 25 (*)    Creatinine, Ser 1.39 (*)    Total Protein 6.1 (*)    Albumin 2.9 (*)    AST 12 (*)    ALT 9 (*)    GFR calc non Af Amer 33 (*)    GFR calc Af Amer 38 (*)    All other components within normal limits  CBC WITH DIFFERENTIAL/PLATELET - Abnormal; Notable for the following:    RBC 2.63 (*)    Hemoglobin 7.6 (*)    HCT 24.5 (*)    RDW 16.4 (*)    All other components within normal limits  I-STAT CHEM 8, ED - Abnormal; Notable for the following:    BUN 23 (*)    Creatinine, Ser 1.40 (*)  Glucose, Bld 123 (*)    Hemoglobin 8.2 (*)    HCT 24.0 (*)    All other components within normal limits  PROTIME-INR  I-STAT CG4 LACTIC ACID, ED  TYPE AND SCREEN  PREPARE RBC (CROSSMATCH)  ABO/RH    Imaging Review Ct Abdomen Pelvis Wo Contrast  04/24/2015   CLINICAL DATA:  Bright red blood per rectum  EXAM: CT ABDOMEN AND PELVIS WITHOUT CONTRAST  TECHNIQUE: Multidetector CT imaging of the abdomen and pelvis was performed following the standard protocol without oral or intravenous contrast material administration.  COMPARISON:  February 28, 2015  FINDINGS: There is scarring in the anterior left lung base region. There are multiple foci of coronary artery calcification. Heart is mildly enlarged.  The previously noted distal thoracic aortic endograft appears stable compared to recent prior study. There is no surrounding fluid.  There is a stable 1 cm cyst in the anterior segment right lobe of the liver. No new liver lesions are identified on this noncontrast enhanced study. Gallbladder wall is not thickened. There is no biliary duct dilatation.  Spleen, pancreas, and left adrenal appear normal. There is a stable adenoma in the right adrenal gland measuring 1.2 x 0.9 cm.  There are several 1 cm or less in size foci of increased attenuation in each kidney, upright representing  small hyperdense cysts given the appearance on recent CT. There is a cyst arising from the lower pole the left kidney measuring 2.9 by 2.9 cm. There is a calculus in the mid to lower pole left kidney measuring 5 x 5 mm, nonobstructing. There is no hydronephrosis on either side. 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 There is no ureteral calculus on either side.  In the pelvis, the urinary bladder is midline with normal wall thickness. There is mixed attenuation material within the rectum without well-defined mass by noncontrast enhanced CT. There are multiple sigmoid diverticula without diverticulitis. There is a stable left ovarian/ adnexal cyst measuring 2.5 x 1.4 cm. No other pelvic mass identified. There is no free pelvic fluid. Uterus is absent.  There is again noted abdominal aortic tortuosity/ectasia with extensive calcification in the aorta and iliac arteries. This appearance is stable. Mild dilatation of the proximal celiac artery is likewise stable.  Compared to the previous study, there has been resolution of rectus muscle inflammatory change. There is some mild thickening of the rectus muscle in the midline at the level of the upper pelvis measuring 2.3 x 0.8 cm. A second area of rectus muscle thickening is seen on the left in the upper pelvic region measuring 2.6 x 1.3 cm. There is no surrounding inflammation. This area may represent a small amount of rectus muscle hematoma. This finding on the left was not present on the previous study. This site is near a defect along the left pelvic wall, likely of postoperative etiology.  There is no bowel obstruction. No free air or portal venous air. There are scattered diverticula throughout the colon. There is no intra-abdominal ascites, adenopathy, or abscess in the abdomen or pelvis. There is degenerative change throughout the lumbar spine. There is lumbar levoscoliosis. There are no blastic or lytic bone  lesions. There is spinal stenosis at L3-4 L4-5, due to bony hypertrophy and disc protrusion.  IMPRESSION: The previously noted rectus muscle abscesses have resolved. There is postoperative change along the left upper pelvic abdominal wall. There are small apparent rectus muscle hematoma is midline and on  the left in the upper pelvic region without surrounding inflammation.  Mixed attenuation material within the rectum. A degree of hemorrhage in this area is questioned. A well-defined mass is not seen. There is no perirectal inflammatory change seen on this study.  Stable left adnexal region cyst.  No new pelvic mass.  Extensive colonic diverticulosis without diverticulitis.  Multiple presumed hyperdense cysts in the kidneys. Nonobstructing calculus left kidney. Subcentimeter left peripheral renal artery aneurysm.  Extensive aortic tortuosity and calcification. Stable endograft in the distal thoracic aorta.  Stable benign-appearing mass right adrenal.  No bowel obstruction.  No abscess.  No ascites.   Electronically Signed   By: Bretta Bang III M.D.   On: 04/24/2015 07:21     EKG Interpretation None     CRITICAL CARE Performed by: Olivia Mackie Total critical care time: 30 min Critical care time was exclusive of separately billable procedures and treating other patients. Critical care was necessary to treat or prevent imminent or life-threatening deterioration. Critical care was time spent personally by me on the following activities: development of treatment plan with patient and/or surrogate as well as nursing, discussions with consultants, evaluation of patient's response to treatment, examination of patient, obtaining history from patient or surrogate, ordering and performing treatments and interventions, ordering and review of laboratory studies, ordering and review of radiographic studies, pulse oximetry and re-evaluation of patient's condition.  MDM   Final diagnoses:  Lower GI bleed   Blood loss anemia    81 year old female with new onset of lower GI bleeding.  Patient has had a 2 g drop in her hemoglobin since last blood work 20 days ago.  Patient has ongoing bleeding which is significant.  Patient has history of AAA repair in 2011, per prior chart.  Plan for noncontrast CT abdomen pelvis  (As patient has poor GFR.)  to further evaluate possible bleeding sources.  Patient will need admission at the hospital.  I have ordered 2 units of loud to be transfused given her drop in hemoglobin and persistent bleeding.  We'll contact [facility] as Dr. Ewing Schlein has seen her in the past for their consult this morning.  7:49 AM Dr Ewing Schlein will see the patient in consult.  Marisa Severin, MD 04/24/15 [phone]

## 2015-04-25 DIAGNOSIS — I10 Essential (primary) hypertension: Secondary | ICD-10-CM

## 2015-04-25 DIAGNOSIS — K5731 Diverticulosis of large intestine without perforation or abscess with bleeding: Principal | ICD-10-CM

## 2015-04-25 DIAGNOSIS — D62 Acute posthemorrhagic anemia: Secondary | ICD-10-CM

## 2015-04-25 LAB — CBC
HCT: 26.9 % — ABNORMAL LOW (ref 36.0–46.0)
HCT: 26.8 % — ABNORMAL LOW (ref 36.0–46.0)
HEMATOCRIT: 28.2 % — AB (ref 36.0–46.0)
Hemoglobin: 8.3 g/dL — ABNORMAL LOW (ref 12.0–15.0)
Hemoglobin: 8.8 g/dL — ABNORMAL LOW (ref 12.0–15.0)
Hemoglobin: 8.6 g/dL — ABNORMAL LOW (ref 12.0–15.0)
MCH: 28.1 pg (ref 26.0–34.0)
MCH: 28.7 pg (ref 26.0–34.0)
MCH: 29.3 pg (ref 26.0–34.0)
MCHC: 30.9 g/dL (ref 30.0–36.0)
MCHC: 31.2 g/dL (ref 30.0–36.0)
MCHC: 32.1 g/dL (ref 30.0–36.0)
MCV: 91.2 fL (ref 78.0–100.0)
MCV: 91.9 fL (ref 78.0–100.0)
MCV: 91.2 fL (ref 78.0–100.0)
PLATELETS: 204 10*3/uL (ref 150–400)
Platelets: 181 10*3/uL (ref 150–400)
Platelets: 208 10*3/uL (ref 150–400)
RBC: 2.95 MIL/uL — ABNORMAL LOW (ref 3.87–5.11)
RBC: 3.07 MIL/uL — ABNORMAL LOW (ref 3.87–5.11)
RBC: 2.94 MIL/uL — AB (ref 3.87–5.11)
RDW: 16.3 % — AB (ref 11.5–15.5)
RDW: 16.6 % — AB (ref 11.5–15.5)
RDW: 16.5 % — ABNORMAL HIGH (ref 11.5–15.5)
WBC: 7 10*3/uL (ref 4.0–10.5)
WBC: 7.2 10*3/uL (ref 4.0–10.5)
WBC: 7.4 10*3/uL (ref 4.0–10.5)

## 2015-04-25 LAB — COMPREHENSIVE METABOLIC PANEL
ALBUMIN: 2.7 g/dL — AB (ref 3.5–5.0)
ALK PHOS: 36 U/L — AB (ref 38–126)
ALT: 8 U/L — AB (ref 14–54)
AST: 11 U/L — ABNORMAL LOW (ref 15–41)
Anion gap: 6 (ref 5–15)
BUN: 21 mg/dL — ABNORMAL HIGH (ref 6–20)
CHLORIDE: 108 mmol/L (ref 101–111)
CO2: 27 mmol/L (ref 22–32)
CREATININE: 1.2 mg/dL — AB (ref 0.44–1.00)
Calcium: 9 mg/dL (ref 8.9–10.3)
GFR, EST AFRICAN AMERICAN: 46 mL/min — AB (ref >60)
GFR, EST NON AFRICAN AMERICAN: 39 mL/min — AB (ref >60)
Glucose, Bld: 99 mg/dL (ref 65–99)
POTASSIUM: 4.3 mmol/L (ref 3.5–5.1)
SODIUM: 141 mmol/L (ref 135–145)
Total Bilirubin: 0.5 mg/dL (ref 0.3–1.2)
Total Protein: 6 g/dL — ABNORMAL LOW (ref 6.5–8.1)

## 2015-04-25 LAB — TYPE AND SCREEN
ABO/RH(D): O POS
Antibody Screen: NEGATIVE
UNIT DIVISION: 0
Unit division: 0

## 2015-04-25 LAB — GLUCOSE, CAPILLARY
GLUCOSE-CAPILLARY: 83 mg/dL (ref 65–99)
GLUCOSE-CAPILLARY: 82 mg/dL (ref 65–99)
Glucose-Capillary: 103 mg/dL — ABNORMAL HIGH (ref 65–99)
Glucose-Capillary: 68 mg/dL (ref 65–99)

## 2015-04-25 LAB — HEMOGLOBIN A1C
HEMOGLOBIN A1C: 5.5 % (ref 4.8–5.6)
Mean Plasma Glucose: 111 mg/dL

## 2015-04-25 LAB — HEMOGLOBIN: HEMOGLOBIN: 8.4 g/dL — AB (ref 12.0–15.0)

## 2015-04-25 MED ORDER — HYDROCORTISONE 2.5 % RE CREA
TOPICAL_CREAM | Freq: Three times a day (TID) | RECTAL | Status: DC
  Administered 2015-04-25 (×2): via RECTAL
  Administered 2015-04-25: 1 via RECTAL
  Administered 2015-04-26 – 2015-04-29 (×10): via RECTAL
  Filled 2015-04-25 (×2): qty 28.35

## 2015-04-25 MED ORDER — LABETALOL HCL 100 MG PO TABS
100.0000 mg | ORAL_TABLET | Freq: Two times a day (BID) | ORAL | Status: DC
  Administered 2015-04-25 – 2015-04-29 (×8): 100 mg via ORAL
  Filled 2015-04-25 (×11): qty 1

## 2015-04-25 MED ORDER — CALCIUM CARBONATE 1250 (500 CA) MG PO TABS
1.0000 | ORAL_TABLET | Freq: Two times a day (BID) | ORAL | Status: DC
  Administered 2015-04-25 – 2015-04-29 (×8): 500 mg via ORAL
  Filled 2015-04-25 (×10): qty 1

## 2015-04-25 MED ORDER — GEMFIBROZIL 600 MG PO TABS
600.0000 mg | ORAL_TABLET | Freq: Two times a day (BID) | ORAL | Status: DC
  Administered 2015-04-26 – 2015-04-29 (×7): 600 mg via ORAL
  Filled 2015-04-25 (×12): qty 1

## 2015-04-25 NOTE — Care Management Note (Signed)
Case Management Note  Patient Details  Name: Jennifer MusselJanie B Foushee MRN: [id number] Date of Birth: 04/20/1925  Subjective/Objective:              Gi bleed hgb dropped from 9.0 to 7.6 within admission time for two hours with active rectal bld.      Action/Plan:   Expected Discharge Date:   (unknown)              [id number] Expected Discharge Plan:  Home/Self Care  In-House Referral:  NA  Discharge planning Services  CM Consult  Post Acute Care Choice:  NA Choice offered to:  NA  DME Arranged:    DME Agency:     HH Arranged:    HH Agency:     Status of Service:     Medicare Important Message Given:    Date Medicare IM Given:    Medicare IM give by:    Date Additional Medicare IM Given:    Additional Medicare Important Message give by:     If discussed at Long Length of Stay Meetings, dates discussed:    Additional Comments:  Golda AcreDavis, Rhonda Lynn, RN 04/25/2015, 12:16 PM

## 2015-04-25 NOTE — Progress Notes (Signed)
Date:  Apr 25, 2015 U.R. performed for needs and level of care. Will continue to follow for Case Management needs.  Keymiah Lyles, RN, BSN, CCM   [phone] 

## 2015-04-25 NOTE — Progress Notes (Signed)
PROGRESS NOTE    Jennifer [name] WUJ:[id number] DOB: 07/20/1925 DOA: 04/24/2015 PCP: [name], KAREN, MD  HPI/Brief narrative 81 year old female patient with history of DM, HTN, repaired AAA, GERD, diverticulosis by colonoscopy 6 years ago, not on NSAIDs or aspirin, [facility] with history of bright red bleeding per rectum and LLQ abdominal pain that started overnight 5/22. She was admitted to stepdown unit. GI consulted and being managed conservatively.   Assessment/Plan:  Acute lower GI bleed/likely diverticular - Admitted to stepdown. - CT abdomen results as below appreciated - Managed supportively with clear liquids, following frequent CBCs - Clinically improved: No BM since 10 PM 5/23. - GI consultation and follow-up appreciated: Plan to continue clear liquids for additional 24 hours and if no further bleeding then can advance diet. If she has worsening bleeding then will need bleeding scan.  Acute blood loss anemia complicating chronic anemia - Baseline hemoglobin probably in the 8-9 grams per DL range - Admitted with hemoglobin of 7.6 - Transfused 2 units of PRBCs 5/23 with appropriate increase to 9 g and then has been in the low 8 g range over the last 12-16 hours - Follow CBC and transfuse if hemoglobin less than 8 g per DL - Acute blood loss anemia secondary to GI bleed  Essential hypertension - Blood pressures mildly uncontrolled. - Labetalol on hold since admission due to concern for decompensation - If blood pressures start to rise, may have to resume labetalol.  Type II DM - Good inpatient control on NovoLog SSI   Chronic diastolic CHF - Compensative    Mild acute on stage III chronic kidney disease  - Baseline creatinine probably in the 1 range - Admitted with creatinine of 1.39 - Improving   Status post debridement of rectus/abdominal wall abscess - CT abdomen shows small rectus muscle hematoma without surrounding  inflammation   DVT prophylaxis: SCDs Code Status: DNR Family Communication: None at bedside Disposition Plan: Continue monitoring in stepdown unit for additional 24 hours    Consultants:  [facility]  Procedures:  None  Antibiotics:  None   Subjective: No BM since 10 PM on 5/23. Denies abdominal pain, nausea, vomiting, chest pain, dyspnea, dizziness or lightheadedness. Asking for solid food.   Objective: Filed Vitals:   04/25/15 0500 04/25/15 0600 04/25/15 0800 04/25/15 0850  BP:    152/59  Pulse: 72 86  68  Temp:   98.2 F (36.8 C)   TempSrc:   Oral   Resp: 19 22  13   Height:      Weight:      SpO2: 94% 96%  98%    Intake/Output Summary (Last 24 hours) at 04/25/15 1050 Last data filed at 04/25/15 0900  Gross per 24 hour  Intake   1508 ml  Output   2575 ml  Net  -1067 ml   Filed Weights   04/24/15 1215  Weight: 58.3 kg (128 lb 8.5 oz)     Exam:  General exam: Pleasant elderly female lying comfortably supine in bed.  Respiratory system: Clear. No increased work of breathing. Cardiovascular system: S1 & S2 heard, RRR. No JVD, murmurs, gallops, clicks or pedal edema. Gastrointestinal system: Abdomen is nondistended, soft and nontender. Normal bowel sounds heard. Central nervous system: Alert and oriented. No focal neurological deficits. Extremities: Symmetric 5 x 5 power.   Data Reviewed: Basic Metabolic Panel:  Recent Labs Lab 04/24/15 0600 04/24/15 0604 04/25/15 0334  NA 140 141 141  K  3.9 3.9 4.3  CL 107 108 108  CO2 23  --  27  GLUCOSE 120* 123* 99  BUN 25* 23* 21*  CREATININE 1.39* 1.40* 1.20*  CALCIUM 9.2  --  9.0   Liver Function Tests:  Recent Labs Lab 04/24/15 0600 04/25/15 0334  AST 12* 11*  ALT 9* 8*  ALKPHOS 47 36*  BILITOT 0.4 0.5  PROT 6.1* 6.0*  ALBUMIN 2.9* 2.7*   No results for input(s): LIPASE, AMYLASE in the last 168 hours. No results for input(s): AMMONIA in the last 168 hours. CBC:  Recent  Labs Lab 04/24/15 0600 04/24/15 0604 04/24/15 1635 04/24/15 1950 04/25/15 0334  WBC 9.8  --   --   --  7.0  NEUTROABS 7.0  --   --   --   --   HGB 7.6* 8.2* 9.0* 8.5* 8.4*  8.3*  HCT 24.5* 24.0* 28.9*  --  26.9*  MCV 93.2  --   --   --  91.2  PLT 250  --   --   --  181   Cardiac Enzymes: No results for input(s): CKTOTAL, CKMB, CKMBINDEX, TROPONINI in the last 168 hours. BNP (last 3 results) No results for input(s): PROBNP in the last 8760 hours. CBG:  Recent Labs Lab 04/24/15 1336 04/24/15 1634 04/24/15 2223 04/25/15 0748  GLUCAP 86 159* 89 103*    Recent Results (from the past 240 hour(s))  MRSA PCR Screening     Status: None   Collection Time: 04/24/15  9:29 AM  Result Value Ref Range Status   MRSA by PCR NEGATIVE NEGATIVE Final    Comment:        The GeneXpert MRSA Assay (FDA approved for NASAL specimens only), is one component of a comprehensive MRSA colonization surveillance program. It is not intended to diagnose MRSA infection nor to guide or monitor treatment for MRSA infections.          Studies: Ct Abdomen Pelvis Wo Contrast  04/24/2015   CLINICAL DATA:  Bright red blood per rectum  EXAM: CT ABDOMEN AND PELVIS WITHOUT CONTRAST  TECHNIQUE: Multidetector CT imaging of the abdomen and pelvis was performed following the standard protocol without oral or intravenous contrast material administration.  COMPARISON:  February 28, 2015  FINDINGS: There is scarring in the anterior left lung base region. There are multiple foci of coronary artery calcification. Heart is mildly enlarged.  The previously noted distal thoracic aortic endograft appears stable compared to recent prior study. There is no surrounding fluid.  There is a stable 1 cm cyst in the anterior segment right lobe of the liver. No new liver lesions are identified on this noncontrast enhanced study. Gallbladder wall is not thickened. There is no biliary duct dilatation.  Spleen, pancreas, and left  adrenal appear normal. There is a stable adenoma in the right adrenal gland measuring 1.2 x 0.9 cm.  There are several 1 cm or less in size foci of increased attenuation in each kidney, upright representing small hyperdense cysts given the appearance on recent CT. There is a cyst arising from the lower pole the left kidney measuring 2.9 by 2.9 cm. There is a calculus in the mid to lower pole left kidney measuring 5 x 5 mm, nonobstructing. There is no hydronephrosis on either side. 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 There is no ureteral calculus on either side.  In the pelvis, the urinary bladder is midline with  normal wall thickness. There is mixed attenuation material within the rectum without well-defined mass by noncontrast enhanced CT. There are multiple sigmoid diverticula without diverticulitis. There is a stable left ovarian/ adnexal cyst measuring 2.5 x 1.4 cm. No other pelvic mass identified. There is no free pelvic fluid. Uterus is absent.  There is again noted abdominal aortic tortuosity/ectasia with extensive calcification in the aorta and iliac arteries. This appearance is stable. Mild dilatation of the proximal celiac artery is likewise stable.  Compared to the previous study, there has been resolution of rectus muscle inflammatory change. There is some mild thickening of the rectus muscle in the midline at the level of the upper pelvis measuring 2.3 x 0.8 cm. A second area of rectus muscle thickening is seen on the left in the upper pelvic region measuring 2.6 x 1.3 cm. There is no surrounding inflammation. This area may represent a small amount of rectus muscle hematoma. This finding on the left was not present on the previous study. This site is near a defect along the left pelvic wall, likely of postoperative etiology.  There is no bowel obstruction. No free air or portal venous air. There are scattered diverticula throughout the colon. There  is no intra-abdominal ascites, adenopathy, or abscess in the abdomen or pelvis. There is degenerative change throughout the lumbar spine. There is lumbar levoscoliosis. There are no blastic or lytic bone lesions. There is spinal stenosis at L3-4 L4-5, due to bony hypertrophy and disc protrusion.  IMPRESSION: The previously noted rectus muscle abscesses have resolved. There is postoperative change along the left upper pelvic abdominal wall. There are small apparent rectus muscle hematoma is midline and on the left in the upper pelvic region without surrounding inflammation.  Mixed attenuation material within the rectum. A degree of hemorrhage in this area is questioned. A well-defined mass is not seen. There is no perirectal inflammatory change seen on this study.  Stable left adnexal region cyst.  No new pelvic mass.  Extensive colonic diverticulosis without diverticulitis.  Multiple presumed hyperdense cysts in the kidneys. Nonobstructing calculus left kidney. Subcentimeter left peripheral renal artery aneurysm.  Extensive aortic tortuosity and calcification. Stable endograft in the distal thoracic aorta.  Stable benign-appearing mass right adrenal.  No bowel obstruction.  No abscess.  No ascites.   Electronically Signed   By: Bretta Bang III M.D.   On: 04/24/2015 07:21        Scheduled Meds: . atorvastatin  20 mg Oral q1800  . furosemide  20 mg Oral Daily  . hydrocortisone   Rectal TID  . insulin aspart  0-9 Units Subcutaneous TID WC  . pantoprazole (PROTONIX) IV  40 mg Intravenous Q12H   Continuous Infusions:   Active Problems:   CKD (chronic kidney disease), stage III   Diabetes   CHF (congestive heart failure)   Lower GI bleed    Time spent: 45 minutes     Darek Eifler, MD, FACP, FHM. [facility] Pager [phone]  If 7PM-7AM, please contact night-coverage [email or web address] Password [email or web address] 04/25/2015, 10:50 AM    LOS: 1 day

## 2015-04-25 NOTE — Progress Notes (Signed)
Jennifer [name] 10:25 AM  Subjective: The patient is doing well without any bleeding today and no new complaints and tolerating clear liquids  Objective: Vital signs stable afebrile no acute distress abdomen is soft nontender hemoglobin stable as is BUN  Assessment: Probable diverticular bleeding  Plan: 1 more day of clear liquids and if no signs of bleeding may advance diet tomorrow however if bleeding increases would proceed with nuclear bleeding scan  [facility],Jennifer [name] E  Pager [phone] After 5PM or if no answer call [phone]

## 2015-04-26 ENCOUNTER — Inpatient Hospital Stay (HOSPITAL_COMMUNITY): Payer: Medicare Other

## 2015-04-26 DIAGNOSIS — N183 Chronic kidney disease, stage 3 (moderate): Secondary | ICD-10-CM

## 2015-04-26 DIAGNOSIS — I5022 Chronic systolic (congestive) heart failure: Secondary | ICD-10-CM

## 2015-04-26 DIAGNOSIS — K922 Gastrointestinal hemorrhage, unspecified: Secondary | ICD-10-CM

## 2015-04-26 LAB — CBC
HCT: 26.3 % — ABNORMAL LOW (ref 36.0–46.0)
HEMOGLOBIN: 8.1 g/dL — AB (ref 12.0–15.0)
MCH: 28.4 pg (ref 26.0–34.0)
MCHC: 30.8 g/dL (ref 30.0–36.0)
MCV: 92.3 fL (ref 78.0–100.0)
PLATELETS: 215 10*3/uL (ref 150–400)
RBC: 2.85 MIL/uL — ABNORMAL LOW (ref 3.87–5.11)
RDW: 16.4 % — ABNORMAL HIGH (ref 11.5–15.5)
WBC: 7.3 10*3/uL (ref 4.0–10.5)

## 2015-04-26 LAB — BASIC METABOLIC PANEL
Anion gap: 9 (ref 5–15)
BUN: 17 mg/dL (ref 6–20)
CALCIUM: 9.3 mg/dL (ref 8.9–10.3)
CO2: 26 mmol/L (ref 22–32)
CREATININE: 1.09 mg/dL — AB (ref 0.44–1.00)
Chloride: 106 mmol/L (ref 101–111)
GFR calc Af Amer: 51 mL/min — ABNORMAL LOW (ref >60)
GFR, EST NON AFRICAN AMERICAN: 44 mL/min — AB (ref >60)
GLUCOSE: 99 mg/dL (ref 65–99)
Potassium: 4.2 mmol/L (ref 3.5–5.1)
Sodium: 141 mmol/L (ref 135–145)

## 2015-04-26 LAB — GLUCOSE, CAPILLARY
GLUCOSE-CAPILLARY: 147 mg/dL — AB (ref 65–99)
GLUCOSE-CAPILLARY: 86 mg/dL (ref 65–99)
Glucose-Capillary: 96 mg/dL (ref 65–99)
Glucose-Capillary: 119 mg/dL — ABNORMAL HIGH (ref 65–99)

## 2015-04-26 MED ORDER — HYDRALAZINE HCL 20 MG/ML IJ SOLN
5.0000 mg | INTRAMUSCULAR | Status: DC | PRN
  Administered 2015-04-26 (×2): 5 mg via INTRAVENOUS
  Filled 2015-04-26 (×2): qty 1

## 2015-04-26 MED ORDER — TECHNETIUM TC 99M-LABELED RED BLOOD CELLS IV KIT
23.0000 | PACK | Freq: Once | INTRAVENOUS | Status: AC | PRN
Start: 2015-04-26 — End: 2015-04-26
  Administered 2015-04-26: 23 via INTRAVENOUS

## 2015-04-26 MED ORDER — PANTOPRAZOLE SODIUM 40 MG PO TBEC
40.0000 mg | DELAYED_RELEASE_TABLET | Freq: Every day | ORAL | Status: DC
  Administered 2015-04-26 – 2015-04-29 (×4): 40 mg via ORAL
  Filled 2015-04-26 (×3): qty 1

## 2015-04-26 NOTE — Progress Notes (Signed)
Jennifer RidgesJanie B [name] 2:59 PM  Subjective: Patient seen and case discussed with medicine team as well as her husband and it sounds like her bleeding restarted this morning after breakfast and she feels fine without any new complaints and her bleeding is all bright red  Objective: Vital signs stable afebrile no acute distress abdomen is soft nontender hemoglobin fairly stable  Assessment: Diverticular bleeding probably  Plan: Will proceed with nuclear bleeding scan and if positive consult interventional radiology for possible angiogram and coils to try to stop the bleeding and we will continue to follow and continue clear liquids for now and may resume physical therapy when no signs of bleeding  [facility],Jennifer [name] E  Pager [phone] After 5PM or if no answer call [phone]

## 2015-04-26 NOTE — Progress Notes (Signed)
PT Cancellation Note  Patient Details Name: Jennifer [name] MRN: [id number] DOB: 12/20/1924   Cancelled Treatment:    Reason Eval/Treat Not Completed: Medical issues which prohibited therapy Pt with bright red blood with BM upon return back to bed with nursing.  Pt would like to see MD today and prefers to start PT at a later time, likely tomorrow.   Crescencio Jozwiak,KATHrine E 04/26/2015, 9:39 AM  Zenovia JarredKati Shaneeka Scarboro, PT, DPT 04/26/2015 Pager: [phone]

## 2015-04-26 NOTE — Progress Notes (Signed)
Pt.is A/Ox4 and is ambulatory with 1 person standby assist. [facility] Pt.had urine frequency throughout the shift. Blood pressure medication Labetalol po was started on 5/24. Pt.blood pressure became more elevated this am. Paged Mid-level provider with [facility] this am to notify. New order was written for prn medication for blood pressure and prn was given.

## 2015-04-26 NOTE — Progress Notes (Signed)
[facility] PROGRESS NOTE  Gershon MusselJanie B Pasion JXB:[id number] DOB: 11/11/1925 DOA: 04/24/2015 PCP: [name], KAREN, MD  Assessment/Plan:  Acute lower GI bleed/likely diverticular. - Managed supportively with clear liquids, following frequent CBCs - Hgb has remained stable thus far - On 5/25, pt noted to pass notable amounts of clots per rectum - GI consulted and following. Discussed case with GI who recommends GI bleeding scan per Nuc Med. Orders placed. - Will continue to monitor in stepdown  Acute blood loss anemia complicating chronic anemia - Hgb remaining stable in the 8-9 range - Admitted with hemoglobin of 7.6 and s/p 2 units of PRBCs on 5/23 - Continue to follow CBC and transfuse if hemoglobin less than 8  Essential hypertension - Blood pressures mildly uncontrolled. - Labetalol initially on hold since admission due to concern for decompensation - Labetalol resumed on 5/24.   Type II DM - Good inpatient control on NovoLog SSI   Chronic diastolic CHF - Compensated currently  Mild acute on stage III chronic kidney disease  - Admitted with creatinine of 1.39 - Improving with hydration  Status post debridement of rectus/abdominal wall abscess - CT abdomen shows small rectus muscle hematoma without surrounding inflammation  Code Status: DNR Family Communication: Pt in room (indicate person spoken with, relationship, and if by phone, the number) Disposition Plan: Pending   Consultants:  GI  Procedures:    Antibiotics:  none (indicate start date, and stop date if known)  HPI/Subjective: States being eager to try more regular food.  Objective: Filed Vitals:   04/26/15 0700 04/26/15 0800 04/26/15 1000 04/26/15 1200  BP: 186/71 171/81 139/62 176/71  Pulse: 69 80 74 74  Temp:  97.8 F (36.6 C)    TempSrc:  Oral    Resp: 19 23 19 19   Height:      Weight:      SpO2: 96% 96% 94% 100%    Intake/Output Summary (Last 24 hours) at 04/26/15 1535 Last data  filed at 04/26/15 1424  Gross per 24 hour  Intake      0 ml  Output   2525 ml  Net  -2525 ml   Filed Weights   04/24/15 1215 04/26/15 0400  Weight: 58.3 kg (128 lb 8.5 oz) 56.3 kg (124 lb 1.9 oz)    Exam:   General:  Awake, in nad  Cardiovascular: regular, s1, s2  Respiratory: normal resp effort, no wheezing  Abdomen: soft, nondistended  Musculoskeletal: perfused, no clubbing   Data Reviewed: Basic Metabolic Panel:  Recent Labs Lab 04/24/15 0600 04/24/15 0604 04/25/15 0334 04/26/15 0330  NA 140 141 141 141  K 3.9 3.9 4.3 4.2  CL 107 108 108 106  CO2 23  --  27 26  GLUCOSE 120* 123* 99 99  BUN 25* 23* 21* 17  CREATININE 1.39* 1.40* 1.20* 1.09*  CALCIUM 9.2  --  9.0 9.3   Liver Function Tests:  Recent Labs Lab 04/24/15 0600 04/25/15 0334  AST 12* 11*  ALT 9* 8*  ALKPHOS 47 36*  BILITOT 0.4 0.5  PROT 6.1* 6.0*  ALBUMIN 2.9* 2.7*   No results for input(s): LIPASE, AMYLASE in the last 168 hours. No results for input(s): AMMONIA in the last 168 hours. CBC:  Recent Labs Lab 04/24/15 0600  04/24/15 1635 04/24/15 1950 04/25/15 0334 04/25/15 1257 04/25/15 2102 04/26/15 0330  WBC 9.8  --   --   --  7.0 7.2 7.4 7.3  NEUTROABS 7.0  --   --   --   --   --   --   --  HGB 7.6*  < > 9.0* 8.5* 8.4*  8.3* 8.8* 8.6* 8.1*  HCT 24.5*  < > 28.9*  --  26.9* 28.2* 26.8* 26.3*  MCV 93.2  --   --   --  91.2 91.9 91.2 92.3  PLT 250  --   --   --  181 204 208 215  < > = values in this interval not displayed. Cardiac Enzymes: No results for input(s): CKTOTAL, CKMB, CKMBINDEX, TROPONINI in the last 168 hours. BNP (last 3 results)  Recent Labs  03/31/15 1045  BNP 915.6*    ProBNP (last 3 results) No results for input(s): PROBNP in the last 8760 hours.  CBG:  Recent Labs Lab 04/25/15 1618 04/25/15 1836 04/25/15 2127 04/26/15 0804 04/26/15 1108  GLUCAP 68 147* 82 86 96    Recent Results (from the past 240 hour(s))  MRSA PCR Screening     Status:  None   Collection Time: 04/24/15  9:29 AM  Result Value Ref Range Status   MRSA by PCR NEGATIVE NEGATIVE Final    Comment:        The GeneXpert MRSA Assay (FDA approved for NASAL specimens only), is one component of a comprehensive MRSA colonization surveillance program. It is not intended to diagnose MRSA infection nor to guide or monitor treatment for MRSA infections.      Studies: No results found.  Scheduled Meds: . atorvastatin  20 mg Oral q1800  . calcium carbonate  1 tablet Oral BID WC  . furosemide  20 mg Oral Daily  . gemfibrozil  600 mg Oral BID AC  . hydrocortisone   Rectal TID  . insulin aspart  0-9 Units Subcutaneous TID WC  . labetalol  100 mg Oral BID  . pantoprazole  40 mg Oral Daily   Continuous Infusions:   Active Problems:   CKD (chronic kidney disease), stage III   Diabetes   CHF (congestive heart failure)   Lower GI bleed    Tyhesha Dutson K  [facility] Pager [phone]. If 7PM-7AM, please contact night-coverage at [email or web address], password [facility] 04/26/2015, 3:35 PM  LOS: 2 days

## 2015-04-27 DIAGNOSIS — L8990 Pressure ulcer of unspecified site, unspecified stage: Secondary | ICD-10-CM | POA: Insufficient documentation

## 2015-04-27 LAB — CBC
HCT: 26.3 % — ABNORMAL LOW (ref 36.0–46.0)
HEMOGLOBIN: 8.1 g/dL — AB (ref 12.0–15.0)
MCH: 28.5 pg (ref 26.0–34.0)
MCHC: 30.8 g/dL (ref 30.0–36.0)
MCV: 92.6 fL (ref 78.0–100.0)
Platelets: 216 10*3/uL (ref 150–400)
RBC: 2.84 MIL/uL — AB (ref 3.87–5.11)
RDW: 16.2 % — ABNORMAL HIGH (ref 11.5–15.5)
WBC: 6.4 10*3/uL (ref 4.0–10.5)

## 2015-04-27 LAB — GLUCOSE, CAPILLARY
GLUCOSE-CAPILLARY: 87 mg/dL (ref 65–99)
Glucose-Capillary: 108 mg/dL — ABNORMAL HIGH (ref 65–99)
Glucose-Capillary: 86 mg/dL (ref 65–99)
Glucose-Capillary: 86 mg/dL (ref 65–99)

## 2015-04-27 NOTE — Evaluation (Signed)
Physical Therapy Evaluation Patient Details Name: Jennifer [name] MRN: [id number] DOB: [birth year] Today's Date: 04/27/2015   History of Present Illness    81 year old female patient with history of DM, HTN, repaired AAA, GERD, diverticulosis by colonoscopy 6 years ago, not on NSAIDs or aspirin, admitted to [facility] on 04/24/15 with history of bright red bleeding per rectum and LLQ abdominal pain that started overnight 5/22. She was admitted to stepdown unit. GI consulted and being managed conservatively.   Clinical Impression  Pt admitted as above and presenting with functional mobility limitations 2* generalized weakness and mild ambulatory instability.  Pt should progress to dc home with family assist and HHPT follow up.  Pt spouse states, pt was on HHPT caseload prior to admit    Follow Up Recommendations Home health PT    Equipment Recommendations  None recommended by PT    Recommendations for Other Services       Precautions / Restrictions Precautions Precautions: Fall Restrictions Weight Bearing Restrictions: No      Mobility  Bed Mobility               General bed mobility comments: NT - OOB with RN  Transfers Overall transfer level: Needs assistance Equipment used: Rolling walker (2 wheeled) Transfers: Sit to/from Stand Sit to Stand: Min guard         General transfer comment: assist for safety due to hospital environment, no physical help needed  Ambulation/Gait Ambulation/Gait assistance: Min assist Ambulation Distance (Feet): 200 Feet Assistive device: Rolling walker (2 wheeled) Gait Pattern/deviations: Step-to pattern;Step-through pattern;Shuffle;Trunk flexed     General Gait Details: cues for posture, saftey awareness and position from RW.  Min guard for stability   Stairs            Wheelchair Mobility    Modified Rankin (Stroke Patients Only)       Balance Overall balance assessment: Needs assistance Sitting-balance  support: Feet supported Sitting balance-Leahy Scale: Good     Standing balance support: No upper extremity supported Standing balance-Leahy Scale: Fair                               Pertinent Vitals/Pain Pain Assessment: No/denies pain    Home Living Family/patient expects to be discharged to:: Private residence Living Arrangements: Spouse/significant other Available Help at Discharge: Family;Available 24 hours/day Type of Home: House Home Access: Stairs to enter Entrance Stairs-Rails: None Entrance Stairs-Number of Steps: 1 Home Layout: One level Home Equipment: Environmental consultant - 2 wheels Additional Comments: Was getting HHPT, HHRN prior to this admission    Prior Function Level of Independence: Independent with assistive device(s)               Hand Dominance   Dominant Hand: Right    Extremity/Trunk Assessment   Upper Extremity Assessment: Generalized weakness           Lower Extremity Assessment: Generalized weakness         Communication   Communication: HOH  Cognition Arousal/Alertness: Awake/alert Behavior During Therapy: WFL for tasks assessed/performed Overall Cognitive Status: Within Functional Limits for tasks assessed                      General Comments      Exercises        Assessment/Plan    PT Assessment Patient needs continued PT services  PT Diagnosis Generalized weakness;Difficulty walking   PT Problem  List Decreased strength;Decreased activity tolerance;Decreased balance;Decreased mobility;Decreased knowledge of use of DME;Decreased safety awareness  PT Treatment Interventions DME instruction;Gait training;Stair training;Therapeutic activities;Functional mobility training;Patient/family education;Therapeutic exercise;Balance training   PT Goals (Current goals can be found in the Care Plan section) Acute Rehab PT Goals Patient Stated Goal: Home PT Goal Formulation: With patient Time For Goal Achievement:  05/11/15 Potential to Achieve Goals: Good    Frequency Min 3X/week   Barriers to discharge        Co-evaluation               End of Session Equipment Utilized During Treatment: Gait belt Activity Tolerance: Patient tolerated treatment well Patient left: in chair;with call bell/phone within reach;with family/visitor present Nurse Communication: Mobility status         Time: [id number] PT Time Calculation (min) (ACUTE ONLY): 15 min   Charges:   PT Evaluation $Initial PT Evaluation Tier I: 1 Procedure     PT G Codes:        Jennifer [name] 04/27/2015, 2:21 PM

## 2015-04-27 NOTE — Progress Notes (Signed)
[facility] PROGRESS NOTE  Jennifer [name] ZOX:[id number] DOB: 1925/03/28 DOA: 04/24/2015 PCP: Kristie Cowman, MD  Assessment/Plan:  Acute lower GI bleed/likely diverticular. - Continued on clear liquids, following frequent CBCs - Hgb has remained stable thus far - On 5/25, pt noted to pass notable amounts of clots per rectum. Negative bleeding scan - GI was consulted and following. - GI recs for repeating tagged red cell scan if patient re-bleeds  Acute blood loss anemia complicating chronic anemia - Hgb remaining stable in the 8-9 range - Admitted with hemoglobin of 7.6 and s/p 2 units of PRBCs on 5/23 - Would continue to follow CBC and transfuse if hemoglobin less than 8  Essential hypertension - Patient's blood pressures mildly uncontrolled. - Labetalol was initially on hold since admission due to concern for decompensation and had been resumed on 5/24.   Type II DM - Good inpatient control on NovoLog SSI   Chronic diastolic CHF - Compensated currently  Mild acute on stage III chronic kidney disease  - Admitted with creatinine of 1.39 - Improving with hydration  Status post debridement of rectus/abdominal wall abscess - CT abdomen shows small rectus muscle hematoma without surrounding inflammation  Code Status: DNR Family Communication: Pt in room Disposition Plan: Transferred to med-surg   Consultants:  GI  Procedures:    Antibiotics:  none (indicate start date, and stop date if known)  HPI/Subjective: No complaints this AM. No abd pain  Objective: Filed Vitals:   04/27/15 0800 04/27/15 1000 04/27/15 1206 04/27/15 1500  BP: 153/68 174/73 151/62 135/68  Pulse: 71 82 68 67  Temp: 97.4 F (36.3 C)  98 F (36.7 C) 98.2 F (36.8 C)  TempSrc: Oral  Oral Oral  Resp: Height:      Weight:      SpO2: 95% 99% 99% 99%    Intake/Output Summary (Last 24 hours) at 04/27/15 1639 Last data filed at 04/27/15 1523  Gross per 24 hour   Intake    660 ml  Output   1875 ml  Net  -1215 ml   Filed Weights   04/24/15 1215 04/26/15 0400  Weight: 58.3 kg (128 lb 8.5 oz) 56.3 kg (124 lb 1.9 oz)    Exam:   General:  Awake, laying in bed, in nad  Cardiovascular: regular, s1, s2  Respiratory: normal resp effort, no wheezing  Abdomen: soft, nondistended, nondistended  Musculoskeletal: perfused, no clubbing   Data Reviewed: Basic Metabolic Panel:  Recent Labs Lab 04/24/15 0600 04/24/15 0604 04/25/15 0334 04/26/15 0330  NA 140 141 141 141  K 3.9 3.9 4.3 4.2  CL 107 108 108 106  CO2 23  --  27 26  GLUCOSE 120* 123* 99 99  BUN 25* 23* 21* 17  CREATININE 1.39* 1.40* 1.20* 1.09*  CALCIUM 9.2  --  9.0 9.3   Liver Function Tests:  Recent Labs Lab 04/24/15 0600 04/25/15 0334  AST 12* 11*  ALT 9* 8*  ALKPHOS 47 36*  BILITOT 0.4 0.5  PROT 6.1* 6.0*  ALBUMIN 2.9* 2.7*   No results for input(s): LIPASE, AMYLASE in the last 168 hours. No results for input(s): AMMONIA in the last 168 hours. CBC:  Recent Labs Lab 04/24/15 0600  04/25/15 0334 04/25/15 1257 04/25/15 2102 04/26/15 0330 04/27/15 0353  WBC 9.8  --  7.0 7.2 7.4 7.3 6.4  NEUTROABS 7.0  --   --   --   --   --   --  HGB 7.6*  < > 8.4*  8.3* 8.8* 8.6* 8.1* 8.1*  HCT 24.5*  < > 26.9* 28.2* 26.8* 26.3* 26.3*  MCV 93.2  --  91.2 91.9 91.2 92.3 92.6  PLT 250  --  181 204 208 215 216  < > = values in this interval not displayed. Cardiac Enzymes: No results for input(s): CKTOTAL, CKMB, CKMBINDEX, TROPONINI in the last 168 hours. BNP (last 3 results)  Recent Labs  03/31/15 1045  BNP 915.6*    ProBNP (last 3 results) No results for input(s): PROBNP in the last 8760 hours.  CBG:  Recent Labs Lab 04/26/15 0804 04/26/15 1108 04/26/15 2157 04/27/15 0739 04/27/15 1203  GLUCAP 86 96 119* 108* 86    Recent Results (from the past 240 hour(s))  MRSA PCR Screening     Status: None   Collection Time: 04/24/15  9:29 AM  Result Value Ref  Range Status   MRSA by PCR NEGATIVE NEGATIVE Final    Comment:        The GeneXpert MRSA Assay (FDA approved for NASAL specimens only), is one component of a comprehensive MRSA colonization surveillance program. It is not intended to diagnose MRSA infection nor to guide or monitor treatment for MRSA infections.      Studies: Nm Gi Blood Loss  04/26/2015   CLINICAL DATA:  Lower gastrointestinal bleeding.  EXAM: NUCLEAR MEDICINE GASTROINTESTINAL BLEEDING SCAN  TECHNIQUE: Sequential abdominal images were obtained following intravenous administration of Tc-1390m labeled red blood cells.  RADIOPHARMACEUTICALS:  23 mCi Tc-6690m in-vitro labeled red cells.  COMPARISON:  CT scan of Apr 24, 2015.  FINDINGS: No abnormal accumulation of radiotracer is noted. Normal uptake within vessels and urinary bladder is noted.  IMPRESSION: No scintigraphic evidence of gastrointestinal bleeding.   Electronically Signed   By: Lupita RaiderJames  Green Jr, M.D.   On: 04/26/2015 18:44    Scheduled Meds: . atorvastatin  20 mg Oral q1800  . calcium carbonate  1 tablet Oral BID WC  . furosemide  20 mg Oral Daily  . gemfibrozil  600 mg Oral BID AC  . hydrocortisone   Rectal TID  . insulin aspart  0-9 Units Subcutaneous TID WC  . labetalol  100 mg Oral BID  . pantoprazole  40 mg Oral Daily   Continuous Infusions:   Active Problems:   CKD (chronic kidney disease), stage III   Diabetes   CHF (congestive heart failure)   Lower GI bleed   Pressure ulcer    Zaide Kardell K  [facility] Pager [phone]. If 7PM-7AM, please contact night-coverage at [email or web address], password [email or web address] 04/27/2015, 4:39 PM  LOS: 3 days

## 2015-04-27 NOTE — Progress Notes (Signed)
Jennifer [name] 10:10 AM  Subjective: Patient with one small bowel movement today mostly dark but no bright red blood and no new complaints and we had a long talk about diverticular bleeding including the various ways to stop it and now she tells me she had been on some Aleve at home and we discussed Tylenol only Objective: Vital signs stable afebrile no acute distress abdomen is soft nontender hemoglobin stable nuclear bleeding scan negative  Assessment: Diverticular bleeding  Plan: Okay to go to floor if bleeding restarts over the next 12 hours can re-ask nuclear medicine to image otherwise if no signs of bleeding tomorrow morning may slowly advance diet to soft solids but would keep on clears for today  [facility],Valerya Maxton E  Pager [phone] After 5PM or if no answer call [phone]

## 2015-04-28 LAB — CBC
HCT: 25.4 % — ABNORMAL LOW (ref 36.0–46.0)
Hemoglobin: 7.9 g/dL — ABNORMAL LOW (ref 12.0–15.0)
MCH: 28.7 pg (ref 26.0–34.0)
MCHC: 31.1 g/dL (ref 30.0–36.0)
MCV: 92.4 fL (ref 78.0–100.0)
Platelets: 178 10*3/uL (ref 150–400)
RBC: 2.75 MIL/uL — ABNORMAL LOW (ref 3.87–5.11)
RDW: 16.2 % — ABNORMAL HIGH (ref 11.5–15.5)
WBC: 6.5 10*3/uL (ref 4.0–10.5)

## 2015-04-28 LAB — GLUCOSE, CAPILLARY
GLUCOSE-CAPILLARY: 91 mg/dL (ref 65–99)
Glucose-Capillary: 94 mg/dL (ref 65–99)
Glucose-Capillary: 104 mg/dL — ABNORMAL HIGH (ref 65–99)
Glucose-Capillary: 116 mg/dL — ABNORMAL HIGH (ref 65–99)

## 2015-04-28 NOTE — Progress Notes (Signed)
Physical Therapy Treatment Patient Details Name: Gershon MusselJanie B Moseman MRN: [id number] DOB: 10/28/1925 Today's Date: 04/28/2015    History of Present Illness Patient is an 81 y/o female with history of hypertension, congestive diastolic heart failure NYHA class III, diabetes on metformin, CK D stage III, history of thoracic aortic aneurysm with history of repair, and iron deficiency and recent abdominal wall abscess s/p I&D presented with SOB/acute respiratory distress.    PT Comments    Pt motivated and progressing with mobility.  Pt hopeful for dc tomorrow.  Follow Up Recommendations  Home health PT     Equipment Recommendations  None recommended by PT    Recommendations for Other Services       Precautions / Restrictions Precautions Precautions: Fall Restrictions Weight Bearing Restrictions: No    Mobility  Bed Mobility Overal bed mobility: Modified Independent                Transfers Overall transfer level: Needs assistance Equipment used: Rolling walker (2 wheeled) Transfers: Sit to/from Stand Sit to Stand: Supervision         General transfer comment: min cues for transition position and use of UEs to self assist  Ambulation/Gait Ambulation/Gait assistance: Min guard;Min assist Ambulation Distance (Feet): 450 Feet Assistive device: Rolling walker (2 wheeled);None Gait Pattern/deviations: Step-through pattern;Decreased step length - right;Decreased step length - left;Shuffle;Trunk flexed     General Gait Details: cues for posture, saftey awareness and position from RW.  Pt ambulated 300' with RW and min guard/sup and additional 150' with no AD and min assist for intermittent mild instability.  Pt verbalizes awareness of balance deficits sans RW   Stairs            Wheelchair Mobility    Modified Rankin (Stroke Patients Only)       Balance                                    Cognition Arousal/Alertness: Awake/alert Behavior  During Therapy: WFL for tasks assessed/performed Overall Cognitive Status: Within Functional Limits for tasks assessed                      Exercises      General Comments        Pertinent Vitals/Pain Pain Assessment: No/denies pain    Home Living                      Prior Function            PT Goals (current goals can now be found in the care plan section) Acute Rehab PT Goals Patient Stated Goal: Home PT Goal Formulation: With patient Time For Goal Achievement: 05/11/15 Potential to Achieve Goals: Good Progress towards PT goals: Progressing toward goals    Frequency  Min 3X/week    PT Plan Current plan remains appropriate    Co-evaluation             End of Session Equipment Utilized During Treatment: Gait belt Activity Tolerance: Patient tolerated treatment well Patient left: in chair;with call bell/phone within reach     Time: 1522-1538 PT Time Calculation (min) (ACUTE ONLY): 16 min  Charges:  $Gait Training: 8-22 mins                    G Codes:      Emmit Oriley 04/28/2015, 5:21 PM

## 2015-04-28 NOTE — Progress Notes (Signed)
Jennifer [name] 10:11 AM  Subjective: Patient without signs of bleeding and no GI complaints and likes her new room  Objective: Vital signs stable afebrile no acute distress abdomen is soft nontender hemoglobin stable  Assessment: Seemingly resolved diverticular probable bleeding  Plan: Okay to advance diet and hopefully home tomorrow and follow up with me as an outpatient in a few weeks to recheck hemoglobin and please call us this weekend if we could be of any further assistance  [facility],Meredith Mells E  Pager [phone] After 5PM or if no answer call [phone]

## 2015-04-28 NOTE — Progress Notes (Signed)
[facility] PROGRESS NOTE  Jennifer MusselJanie B [name] ZOX:[id number] DOB: 01/01/1925 DOA: 04/24/2015 PCP: [name], KAREN, MD  Assessment/Plan: Acute lower GI bleed/likely diverticular. - Continued on clear liquids, following frequent CBCs - Hgb has remained stable thus far - On 5/25, pt noted to pass multiple clots per rectum with a negative bleeding scan - GI has been following. - GI recs for repeating tagged red cell scan if patient re-bleeds - Hgb remains stable  Acute blood loss anemia complicating chronic anemia - Hgb remaining stable in the 8-9 range - Admitted with hemoglobin of 7.6 and s/p 2 units of PRBCs on 5/23 - Cont to follow CBC  Essential hypertension - Patient's blood pressures mildly uncontrolled. - Labetalol was initially on hold since admission due to concern for decompensation, but was resumed on 5/24.   Type II DM - Good inpatient control on NovoLog SSI   Chronic diastolic CHF - Remained compensated  Mild acute on stage III chronic kidney disease  - Admitted with creatinine of 1.39 - Improved and normalized with hydration  Status post debridement of rectus/abdominal wall abscess - CT abdomen shows small rectus muscle hematoma without surrounding inflammation  Code Status: DNR Family Communication: Pt in room Disposition Plan: Possible d/c 5/28 if stable   Consultants:  GI  Procedures:    Antibiotics:  none  HPI/Subjective: Eager to go home. No complaints.  Objective: Filed Vitals:   04/27/15 2114 04/27/15 2148 04/28/15 0507 04/28/15 1409  BP: 138/60  128/61 101/49  Pulse: 71 70 69 72  Temp: 98.1 F (36.7 C)  98.2 F (36.8 C) 98.4 F (36.9 C)  TempSrc: Oral  Oral Oral  Resp: 16  16 16   Height:      Weight:      SpO2: 98%  96% 98%    Intake/Output Summary (Last 24 hours) at 04/28/15 1433 Last data filed at 04/28/15 [id number]  Gross per 24 hour  Intake   1150 ml  Output   1625 ml  Net   -475 ml   Filed Weights   04/24/15 1215  04/26/15 0400  Weight: 58.3 kg (128 lb 8.5 oz) 56.3 kg (124 lb 1.9 oz)    Exam:   General:  Awake, laying in bed, in nad, surrounded by family  Cardiovascular: regular, s1, s2  Respiratory: normal resp effort, no wheezing  Abdomen: soft, nondistended, nondistended, pos BS  Musculoskeletal: perfused, no clubbing   Data Reviewed: Basic Metabolic Panel:  Recent Labs Lab 04/24/15 0600 04/24/15 0604 04/25/15 0334 04/26/15 0330  NA 140 141 141 141  K 3.9 3.9 4.3 4.2  CL 107 108 108 106  CO2 23  --  27 26  GLUCOSE 120* 123* 99 99  BUN 25* 23* 21* 17  CREATININE 1.39* 1.40* 1.20* 1.09*  CALCIUM 9.2  --  9.0 9.3   Liver Function Tests:  Recent Labs Lab 04/24/15 0600 04/25/15 0334  AST 12* 11*  ALT 9* 8*  ALKPHOS 47 36*  BILITOT 0.4 0.5  PROT 6.1* 6.0*  ALBUMIN 2.9* 2.7*   No results for input(s): LIPASE, AMYLASE in the last 168 hours. No results for input(s): AMMONIA in the last 168 hours. CBC:  Recent Labs Lab 04/24/15 0600  04/25/15 1257 04/25/15 2102 04/26/15 0330 04/27/15 0353 04/28/15 0412  WBC 9.8  < > 7.2 7.4 7.3 6.4 6.5  NEUTROABS 7.0  --   --   --   --   --   --   HGB 7.6*  < >  8.8* 8.6* 8.1* 8.1* 7.9*  HCT 24.5*  < > 28.2* 26.8* 26.3* 26.3* 25.4*  MCV 93.2  < > 91.9 91.2 92.3 92.6 92.4  PLT 250  < > 204 208 215 216 178  < > = values in this interval not displayed. Cardiac Enzymes: No results for input(s): CKTOTAL, CKMB, CKMBINDEX, TROPONINI in the last 168 hours. BNP (last 3 results)  Recent Labs  03/31/15 1045  BNP 915.6*    ProBNP (last 3 results) No results for input(s): PROBNP in the last 8760 hours.  CBG:  Recent Labs Lab 04/27/15 1203 04/27/15 1702 04/27/15 2112 04/28/15 0734 04/28/15 1227  GLUCAP 86 86 87 91 94    Recent Results (from the past 240 hour(s))  MRSA PCR Screening     Status: None   Collection Time: 04/24/15  9:29 AM  Result Value Ref Range Status   MRSA by PCR NEGATIVE NEGATIVE Final    Comment:         The GeneXpert MRSA Assay (FDA approved for NASAL specimens only), is one component of a comprehensive MRSA colonization surveillance program. It is not intended to diagnose MRSA infection nor to guide or monitor treatment for MRSA infections.      Studies: Nm Gi Blood Loss  04/26/2015   CLINICAL DATA:  Lower gastrointestinal bleeding.  EXAM: NUCLEAR MEDICINE GASTROINTESTINAL BLEEDING SCAN  TECHNIQUE: Sequential abdominal images were obtained following intravenous administration of Tc-26m labeled red blood cells.  RADIOPHARMACEUTICALS:  23 mCi Tc-62m in-vitro labeled red cells.  COMPARISON:  CT scan of Apr 24, 2015.  FINDINGS: No abnormal accumulation of radiotracer is noted. Normal uptake within vessels and urinary bladder is noted.  IMPRESSION: No scintigraphic evidence of gastrointestinal bleeding.   Electronically Signed   By: Lupita Raider, M.D.   On: 04/26/2015 18:44    Scheduled Meds: . atorvastatin  20 mg Oral q1800  . calcium carbonate  1 tablet Oral BID WC  . furosemide  20 mg Oral Daily  . gemfibrozil  600 mg Oral BID AC  . hydrocortisone   Rectal TID  . insulin aspart  0-9 Units Subcutaneous TID WC  . labetalol  100 mg Oral BID  . pantoprazole  40 mg Oral Daily   Continuous Infusions:   Active Problems:   CKD (chronic kidney disease), stage III   Diabetes   CHF (congestive heart failure)   Lower GI bleed   Pressure ulcer    Jennifer [name] K  [facility] Pager [phone]. If 7PM-7AM, please contact night-coverage at [email or web address], password [facility] 04/28/2015, 2:33 PM  LOS: 4 days

## 2015-04-28 NOTE — Plan of Care (Signed)
Problem: Phase II Progression Outcomes Goal: Tolerating diet Outcome: Progressing Tolerating clears without problems, no further bleeding last night

## 2015-04-28 NOTE — Care Management Note (Signed)
Case Management Note  Patient Details  Name: Jennifer [name] MRN: [id number] Date of Birth: 12/18/1924  Subjective/Objective:                 81 yo admitted with Lower GI bleed   Action/Plan: From home with husband  Expected Discharge Date:   (unknown)               Expected Discharge Plan:  Home w Home Health Services  In-House Referral:  NA  Discharge planning Services  CM Consult  Post Acute Care Choice:  NA Choice offered to:  NA  DME Arranged:    DME Agency:     HH Arranged:  RN, PT [facility]  Status of Service:  In process, will continue to follow  Medicare Important Message Given:  Yes Date Medicare IM Given:  04/28/15 Medicare IM give by:  Sandford CrazeNora Arneda Sappington RN,BSN,NCM Date Additional Medicare IM Given:    Additional Medicare Important Message give by:     If discussed at Long Length of Stay Meetings, dates discussed:    Additional Comments: [facility]. [facility] rep made aware of pt admission. Will need MD orders for resumption of services at DC. Bartholome BillCLEMENTS, Earle Burson H, RN 04/28/2015, 3:25 PM

## 2015-04-29 LAB — HEMOGLOBIN AND HEMATOCRIT, BLOOD
HCT: 24.8 % — ABNORMAL LOW (ref 36.0–46.0)
HEMOGLOBIN: 7.7 g/dL — AB (ref 12.0–15.0)

## 2015-04-29 LAB — CBC
HCT: 24.1 % — ABNORMAL LOW (ref 36.0–46.0)
Hemoglobin: 7.5 g/dL — ABNORMAL LOW (ref 12.0–15.0)
MCH: 29 pg (ref 26.0–34.0)
MCHC: 31.1 g/dL (ref 30.0–36.0)
MCV: 93.1 fL (ref 78.0–100.0)
Platelets: 177 10*3/uL (ref 150–400)
RBC: 2.59 MIL/uL — ABNORMAL LOW (ref 3.87–5.11)
RDW: 16.3 % — AB (ref 11.5–15.5)
WBC: 6.9 10*3/uL (ref 4.0–10.5)

## 2015-04-29 LAB — GLUCOSE, CAPILLARY
GLUCOSE-CAPILLARY: 89 mg/dL (ref 65–99)
Glucose-Capillary: 96 mg/dL (ref 65–99)

## 2015-04-29 MED ORDER — FERROUS SULFATE 325 (65 FE) MG PO TABS
325.0000 mg | ORAL_TABLET | Freq: Every day | ORAL | Status: DC
  Administered 2015-04-29: 325 mg via ORAL
  Filled 2015-04-29 (×2): qty 1

## 2015-04-29 NOTE — Discharge Summary (Signed)
Physician Discharge Summary  EMILY MASSAR ZOX:[id number] DOB: 10/20/1925 DOA: 04/24/2015  PCP: Kristie Cowman, MD  Admit date: 04/24/2015 Discharge date: 04/29/2015  Time spent: 20 minutes  Recommendations for Outpatient Follow-up:  1. Follow up with PCP in 1-2 weeks 2. Follow up with GI - to be scheduled 3. Please repeat CBC within 1-2 weeks  Discharge Diagnoses:  Active Problems:   CKD (chronic kidney disease), stage III   Diabetes   CHF (congestive heart failure)   Lower GI bleed   Pressure ulcer   Discharge Condition: Stable  Diet recommendation: Regular  Filed Weights   04/24/15 1215 04/26/15 0400  Weight: 58.3 kg (128 lb 8.5 oz) 56.3 kg (124 lb 1.9 oz)    History of present illness:  Please see admit h and p from 5/23 for details. Briefly, pt presented with BRBPR. The patient was admitted for further work up.  Hospital Course:  Acute lower GI bleed/likely diverticular. - Pt received 2 units of PRBC's on initial presentation - Hgb had remained stable through the remainder of the admission - On 5/25, pt noted to pass multiple clots per rectum with a negative bleeding scan - GI has been following and will follow up with patient closely upon discharge  Acute blood loss anemia complicating chronic anemia - Hgb remaining stable in the 8-9 range - Admitted with hemoglobin of 7.6 and s/p 2 units of PRBCs on 5/23  Essential hypertension - Patient's blood pressures mildly uncontrolled. - Labetalol was initially on hold since admission due to concern for decompensation, but was resumed on 5/24.   Type II DM - Good inpatient control on NovoLog SSI   Chronic diastolic CHF - Remained compensated  Mild acute on stage III chronic kidney disease  - Admitted with creatinine of 1.39 - Improved and normalized with hydration  Status post debridement of rectus/abdominal wall abscess - CT abdomen shows small rectus muscle hematoma without surrounding  inflammation  Procedures:  Tagged RBC scan - neg  Consultations:  GI  Discharge Exam: Filed Vitals:   04/28/15 2055 04/28/15 2214 04/29/15 0413 04/29/15 1336  BP: 117/56 118/58 149/55 138/50  Pulse: 75 78 81 76  Temp: 98.4 F (36.9 C)  98.2 F (36.8 C) 98.4 F (36.9 C)  TempSrc: Oral  Oral Oral  Resp: 16  16 16   Height:      Weight:      SpO2: 97%  97% 96%    General: Awake, in nad Cardiovascular: regular, s1, s2 Respiratory: normal resp effort, no wheezing  Discharge Instructions     Medication List    TAKE these medications        atorvastatin 20 MG tablet  Commonly known as:  LIPITOR  Take 1 tablet (20 mg total) by mouth daily at 6 PM.     calcium carbonate 600 MG Tabs tablet  Commonly known as:  OS-CAL  Take 600 mg by mouth 2 (two) times daily with a meal. Takes in the morning and at lunch time     ferrous sulfate 325 (65 FE) MG tablet  Take 1 tablet (325 mg total) by mouth 2 (two) times daily with a meal.     furosemide 20 MG tablet  Commonly known as:  LASIX  Take 1 tablet (20 mg total) by mouth daily.     gemfibrozil 600 MG tablet  Commonly known as:  LOPID  Take 600 mg by mouth 2 (two) times daily.     glipiZIDE 2.5 MG 24 hr  tablet  Commonly known as:  GLIPIZIDE XL  Take 1 tablet (2.5 mg total) by mouth daily with breakfast.     JUICE PLUS FIBRE PO  Take 12 tablets by mouth every evening.     labetalol 100 MG tablet  Commonly known as:  NORMODYNE  Take 1 tablet (100 mg total) by mouth 2 (two) times daily.     omeprazole 40 MG capsule  Commonly known as:  PRILOSEC  Take 40 mg by mouth daily.     SYSTANE OP  Place 1 drop into both eyes 2 (two) times daily.     Vitamin D (Ergocalciferol) 50000 UNITS Caps capsule  Commonly known as:  DRISDOL  Take 50,000 Units by mouth every Saturday.       Allergies  Allergen Reactions  . Sulfur     Unknown reaction   . Tape Rash and Other (See Comments)    "paper" tape please, adhesive tape  "burns'   Follow-up Information    Follow up with [name], KAREN, MD. Schedule an appointment as soon as possible for a visit in 1 week.   Specialty:  Internal Medicine   Why:  Hospital follow up   Contact information:   [address] [place] [address] [phone]       [facility]   Specialty:  Gastroenterology   Why:  you will be called to schedule an appointment   Contact information:   [address] [place] [address] [phone]       [facility]   Why:  Home Health RN, Physical Therapy, Occupational Therapy and aide   Contact information:   [address] [place] [address] [phone]        The results of significant diagnostics from this hospitalization (including imaging, microbiology, ancillary and laboratory) are listed below for reference.    Significant Diagnostic Studies: Ct Abdomen Pelvis Wo Contrast  04/24/2015   CLINICAL DATA:  Bright red blood per rectum  EXAM: CT ABDOMEN AND PELVIS WITHOUT CONTRAST  TECHNIQUE: Multidetector CT imaging of the abdomen and pelvis was performed following the standard protocol without oral or intravenous contrast material administration.  COMPARISON:  February 28, 2015  FINDINGS: There is scarring in the anterior left lung base region. There are multiple foci of coronary artery calcification. Heart is mildly enlarged.  The previously noted distal thoracic aortic endograft appears stable compared to recent prior study. There is no surrounding fluid.  There is a stable 1 cm cyst in the anterior segment right lobe of the liver. No new liver lesions are identified on this noncontrast enhanced study. Gallbladder wall is not thickened. There is no biliary duct dilatation.  Spleen, pancreas, and left adrenal appear normal. There is a stable adenoma in the right adrenal gland measuring 1.2 x 0.9 cm.  There are several 1 cm or less in size foci of  increased attenuation in each kidney, upright representing small hyperdense cysts given the appearance on recent CT. There is a cyst arising from the lower pole the left kidney measuring 2.9 by 2.9 cm. There is a calculus in the mid to lower pole left kidney measuring 5 x 5 mm, nonobstructing. There is no hydronephrosis on either side. 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 There is no ureteral calculus on either side.  In the pelvis, the urinary bladder is midline  with normal wall thickness. There is mixed attenuation material within the rectum without well-defined mass by noncontrast enhanced CT. There are multiple sigmoid diverticula without diverticulitis. There is a stable left ovarian/ adnexal cyst measuring 2.5 x 1.4 cm. No other pelvic mass identified. There is no free pelvic fluid. Uterus is absent.  There is again noted abdominal aortic tortuosity/ectasia with extensive calcification in the aorta and iliac arteries. This appearance is stable. Mild dilatation of the proximal celiac artery is likewise stable.  Compared to the previous study, there has been resolution of rectus muscle inflammatory change. There is some mild thickening of the rectus muscle in the midline at the level of the upper pelvis measuring 2.3 x 0.8 cm. A second area of rectus muscle thickening is seen on the left in the upper pelvic region measuring 2.6 x 1.3 cm. There is no surrounding inflammation. This area may represent a small amount of rectus muscle hematoma. This finding on the left was not present on the previous study. This site is near a defect along the left pelvic wall, likely of postoperative etiology.  There is no bowel obstruction. No free air or portal venous air. There are scattered diverticula throughout the colon. There is no intra-abdominal ascites, adenopathy, or abscess in the abdomen or pelvis. There is degenerative change throughout the lumbar spine. There is  lumbar levoscoliosis. There are no blastic or lytic bone lesions. There is spinal stenosis at L3-4 L4-5, due to bony hypertrophy and disc protrusion.  IMPRESSION: The previously noted rectus muscle abscesses have resolved. There is postoperative change along the left upper pelvic abdominal wall. There are small apparent rectus muscle hematoma is midline and on the left in the upper pelvic region without surrounding inflammation.  Mixed attenuation material within the rectum. A degree of hemorrhage in this area is questioned. A well-defined mass is not seen. There is no perirectal inflammatory change seen on this study.  Stable left adnexal region cyst.  No new pelvic mass.  Extensive colonic diverticulosis without diverticulitis.  Multiple presumed hyperdense cysts in the kidneys. Nonobstructing calculus left kidney. Subcentimeter left peripheral renal artery aneurysm.  Extensive aortic tortuosity and calcification. Stable endograft in the distal thoracic aorta.  Stable benign-appearing mass right adrenal.  No bowel obstruction.  No abscess.  No ascites.   Electronically Signed   By: Bretta Bang III M.D.   On: 04/24/2015 07:21   Dg Chest 2 View  03/31/2015   CLINICAL DATA:  Shortness of breath.  EXAM: CHEST  2 VIEW  COMPARISON:  10/31/2014  FINDINGS: Aortic arch and descending thoracic aortic stent noted.  Bilateral interstitial accentuation with Kerley B-lines. Abnormal airspace opacity in the right suprahilar region. Mild atelectasis along both hemidiaphragms.  Mild blunting of the left costophrenic angle. Mild enlargement of the cardiopericardial silhouette  IMPRESSION: 1. Mild cardiomegaly with interstitial accentuation and Kerley B-lines compatible with acute pulmonary edema. There is asymmetric airspace edema or pneumonia in the right suprahilar region. 2. Mild bibasilar atelectasis. 3. Small left pleural effusion. 4. Aortic arch and descending thoracic aortic stent noted.   Electronically Signed    By: Gaylyn Rong M.D.   On: 03/31/2015 10:59   Nm Gi Blood Loss  04/26/2015   CLINICAL DATA:  Lower gastrointestinal bleeding.  EXAM: NUCLEAR MEDICINE GASTROINTESTINAL BLEEDING SCAN  TECHNIQUE: Sequential abdominal images were obtained following intravenous administration of Tc-48m labeled red blood cells.  RADIOPHARMACEUTICALS:  23 mCi Tc-37m in-vitro labeled red cells.  COMPARISON:  CT scan of Apr 24, 2015.  FINDINGS: No abnormal accumulation of radiotracer is noted. Normal uptake within vessels and urinary bladder is noted.  IMPRESSION: No scintigraphic evidence of gastrointestinal bleeding.   Electronically Signed   By: Lupita Raider, M.D.   On: 04/26/2015 18:44    Microbiology: Recent Results (from the past 240 hour(s))  MRSA PCR Screening     Status: None   Collection Time: 04/24/15  9:29 AM  Result Value Ref Range Status   MRSA by PCR NEGATIVE NEGATIVE Final    Comment:        The GeneXpert MRSA Assay (FDA approved for NASAL specimens only), is one component of a comprehensive MRSA colonization surveillance program. It is not intended to diagnose MRSA infection nor to guide or monitor treatment for MRSA infections.      Labs: Basic Metabolic Panel:  Recent Labs Lab 04/24/15 0600 04/24/15 0604 04/25/15 0334 04/26/15 0330  NA 140 141 141 141  K 3.9 3.9 4.3 4.2  CL 107 108 108 106  CO2 23  --  27 26  GLUCOSE 120* 123* 99 99  BUN 25* 23* 21* 17  CREATININE 1.39* 1.40* 1.20* 1.09*  CALCIUM 9.2  --  9.0 9.3   Liver Function Tests:  Recent Labs Lab 04/24/15 0600 04/25/15 0334  AST 12* 11*  ALT 9* 8*  ALKPHOS 47 36*  BILITOT 0.4 0.5  PROT 6.1* 6.0*  ALBUMIN 2.9* 2.7*   No results for input(s): LIPASE, AMYLASE in the last 168 hours. No results for input(s): AMMONIA in the last 168 hours. CBC:  Recent Labs Lab 04/24/15 0600  04/25/15 2102 04/26/15 0330 04/27/15 0353 04/28/15 0412 04/29/15 0455 04/29/15 1043  WBC 9.8  < > 7.4 7.3 6.4 6.5 6.9   --   NEUTROABS 7.0  --   --   --   --   --   --   --   HGB 7.6*  < > 8.6* 8.1* 8.1* 7.9* 7.5* 7.7*  HCT 24.5*  < > 26.8* 26.3* 26.3* 25.4* 24.1* 24.8*  MCV 93.2  < > 91.2 92.3 92.6 92.4 93.1  --   PLT 250  < > 208 215 216 178 177  --   < > = values in this interval not displayed. Cardiac Enzymes: No results for input(s): CKTOTAL, CKMB, CKMBINDEX, TROPONINI in the last 168 hours. BNP: BNP (last 3 results)  Recent Labs  03/31/15 1045  BNP 915.6*    ProBNP (last 3 results) No results for input(s): PROBNP in the last 8760 hours.  CBG:  Recent Labs Lab 04/28/15 1227 04/28/15 1740 04/28/15 2230 04/29/15 0711 04/29/15 1203  GLUCAP 94 104* 116* 96 89     Signed:  Lane Eland K  [facility] 04/29/2015, 2:16 PM

## 2015-04-29 NOTE — Plan of Care (Signed)
Problem: Phase II Progression Outcomes Goal: Tolerating diet Outcome: Completed/Met Date Met:  04/29/15 Advanced to carb mod diet and tolerated pm meal without difficulty. Consumed 100 % of meal.

## 2015-04-29 NOTE — Progress Notes (Signed)
Patient discharged home, all discharge medications and instruction reviewed and questions answered.  Patient to be assisted to vehicle by wheelchair.

## 2015-04-29 NOTE — Plan of Care (Signed)
Problem: Phase III Progression Outcomes Goal: Activity at appropriate level-compared to baseline (UP IN CHAIR FOR HEMODIALYSIS)  Outcome: Progressing Ambulated with PT on 5/27, oob with assist

## 2015-04-29 NOTE — Care Management Note (Signed)
Case Management Note  Patient Details  Name: Jennifer [name] MRN: [id number] Date of Birth: 04/10/1925  Subjective/Objective:    s/p debridement of rectus/abdominal wall abscess           Action/Plan:   Expected Discharge Date:   (unknown)              Expected Discharge Plan:  Home w Home Health Services  In-House Referral:  NA  Discharge planning Services  CM Consult  Post Acute Care Choice:  NA, Home Health, Resumption of Svcs/PTA Provider Choice offered to:  NA, Patient  DME Arranged:    DME Agency:     HH Arranged:  RN, PT [facility]  Status of Service:  Completed, signed off  Medicare Important Message Given:  Yes Date Medicare IM Given:  04/28/15 Medicare IM give by:  Sandford CrazeNora Clements RN,BSN,NCM Date Additional Medicare IM Given:    Additional Medicare Important Message give by:     If discussed at Long Length of Stay Meetings, dates discussed:    Additional Comments: [facility] with [facility] and PT.    Elliot CousinShavis, Huyen Perazzo Ellen, RN 04/29/2015, 2:45 PM

## 2015-05-04 ENCOUNTER — Other Ambulatory Visit: Payer: Self-pay | Admitting: Surgery

## 2015-05-05 ENCOUNTER — Encounter: Payer: Self-pay | Admitting: Surgery

## 2015-05-08 ENCOUNTER — Ambulatory Visit
Admission: RE | Admit: 2015-05-08 | Discharge: 2015-05-08 | Disposition: A | Discharge status: Home / Self Care | Payer: Medicare Other | Source: Ambulatory Visit | Attending: Surgery | Admitting: Surgery

## 2015-05-08 ENCOUNTER — Encounter: Payer: Self-pay | Admitting: Surgery

## 2015-05-08 ENCOUNTER — Ambulatory Visit (INDEPENDENT_AMBULATORY_CARE_PROVIDER_SITE_OTHER): Payer: Medicare Other | Admitting: Surgery

## 2015-05-08 VITALS — BP 153/62 | HR 79 | Ht 63.0 in | Wt 131.5 lb

## 2015-05-08 DIAGNOSIS — Z48812 Encounter for surgical aftercare following surgery on the circulatory system: Secondary | ICD-10-CM | POA: Diagnosis not present

## 2015-05-08 DIAGNOSIS — I712 Thoracic aortic aneurysm, without rupture, unspecified: Secondary | ICD-10-CM

## 2015-05-08 LAB — BUN: BUN: 18 mg/dL (ref 6–23)

## 2015-05-08 LAB — CREATININE, SERUM: Creat: 1.29 mg/dL — ABNORMAL HIGH (ref 0.50–1.10)

## 2015-05-08 MED ORDER — IOPAMIDOL (ISOVUE-370) INJECTION 76%
75.0000 mL | Freq: Once | INTRAVENOUS | Status: AC | PRN
  Administered 2015-05-08: 75 mL via INTRAVENOUS

## 2015-05-08 NOTE — Progress Notes (Signed)
Patient name: Jennifer [name] MRN: [id number] DOB: September 14, 1925 Sex: female     Chief Complaint  Patient presents with  . Re-evaluation    6 month f/u     HISTORY OF PRESENT ILLNESS: She initially presented with left-sided chest pain. 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 She was treated medically for this and discharged to home with follow-up in my office. When I evaluated her, she was having persistent back pain which had gotten worse on the day that I saw her. I repeated her CT scan which did not show any significant changes. She was scheduled for expedited endovascular repair of her aneurysm. On 09/27/2014 she underwent endovascular repair of her aneurysm using a Gore CTAG device. She had a distal extension placed as well. This was done through left common femoral artery access. Following her procedure she did have persistent pain. A CT scan was obtained which showed that the stent graft was in good position and that the aneurysm was completely excluded. She did have a new finding of a subclavian artery dissection as well as narrowing of her left distal external / common femoral artery. She was ultimately discharged to a nursing home.  She has now returned home.  She was hospitalized 2 times since I last saw her.  One was for an abdominal superficial infection requiring incision and drainage and packing.  The other was 4 pulmonary edema.  She also complains of lower extremity edema currently.  Past Medical History  Diagnosis Date  . Diabetes mellitus without complication   . Hypertension   . AAA (abdominal aortic aneurysm)-repaired   . Irregular heart beat   . Acute bronchitis   . Tachycardia     Unspecified  . Shortness of breath dyspnea   . GERD (gastroesophageal reflux disease)   . Anemia   . CHF (congestive heart failure)     Past Surgical History  Procedure Laterality Date  . Abdominal  hysterectomy      40 years ago, intestinal damage  . Abdominal aortic aneurysm repair  2012    Dr. Arbie Cookey  . Breast lumpectomy Left     Benign Mass  . Thoracic aortic endovascular stent graft N/A 09/27/2014    Procedure: THORACIC AORTIC ENDOVASCULAR STENT GRAFT;  Surgeon: Nada Libman, MD;  Location: [facility] OR;  Service: Vascular;  Laterality: N/A;  . Oophorectomy      40 years ago  . Incision and drainage abscess N/A 02/28/2015    Procedure: INCISION AND DRAINAGE ABSCESS ABDOMINAL WALL;  Surgeon: Claud Kelp, MD;  Location: [facility] OR;  Service: General;  Laterality: N/A;    History   Social History  . Marital Status: Married    Spouse Name: N/A  . Number of Children: N/A  . Years of Education: N/A   Occupational History  . Not on file.   Social History Main Topics  . Smoking status: Never Smoker   . Smokeless tobacco: Never Used     Comment: quit smoking in 1975  . Alcohol Use: No  . Drug Use: No  . Sexual Activity: Not on file   Other Topics Concern  . Not on file   Social History Narrative    Family History  Problem Relation Age of Onset  . Heart disease Sister   . Cancer Brother     Allergies as of 05/08/2015 - Review Complete 05/08/2015  Allergen Reaction Noted  . Sulfur  04/24/2015  .  Tape Rash and Other (See Comments) 04/01/2015    Current Outpatient Prescriptions on File Prior to Visit  Medication Sig Dispense Refill  . atorvastatin (LIPITOR) 20 MG tablet Take 1 tablet (20 mg total) by mouth daily at 6 PM. 30 tablet 11  . calcium carbonate (OS-CAL) 600 MG TABS tablet Take 600 mg by mouth 2 (two) times daily with a meal. Takes in the morning and at lunch time    . ferrous sulfate 325 (65 FE) MG tablet Take 1 tablet (325 mg total) by mouth 2 (two) times daily with a meal. 60 tablet 0  . furosemide (LASIX) 20 MG tablet Take 1 tablet (20 mg total) by mouth daily. (Patient taking differently: Take 20 mg by mouth 2 (two) times daily. ) 30 tablet 0  . gemfibrozil  (LOPID) 600 MG tablet Take 600 mg by mouth 2 (two) times daily.  5  . glipiZIDE (GLIPIZIDE XL) 2.5 MG 24 hr tablet Take 1 tablet (2.5 mg total) by mouth daily with breakfast. 30 tablet 0  . labetalol (NORMODYNE) 100 MG tablet Take 1 tablet (100 mg total) by mouth 2 (two) times daily. 60 tablet 0  . Nutritional Supplements (JUICE PLUS FIBRE PO) Take 12 tablets by mouth every evening.     Marland Kitchen. omeprazole (PRILOSEC) 40 MG capsule Take 40 mg by mouth daily.  2  . Polyethyl Glycol-Propyl Glycol (SYSTANE OP) Place 1 drop into both eyes 2 (two) times daily.     . Vitamin D, Ergocalciferol, (DRISDOL) 50000 UNITS CAPS capsule Take 50,000 Units by mouth every Saturday.      No current facility-administered medications on file prior to visit.     REVIEW OF SYSTEMS: See history of present illness, otherwise negative  PHYSICAL EXAMINATION:   Vital signs are  Filed Vitals:   05/08/15 1155  BP: 153/62  Pulse: 79  Height: 5\' 3"  (1.6 m)  Weight: 131 lb 8 oz (59.648 kg)  SpO2: 93%   Body mass index is 23.3 kg/(m^2). General: The patient appears their stated age. HEENT:  No gross abnormalities Pulmonary:  Non labored breathing Abdomen: Soft and non-tender Musculoskeletal: There are no major deformities. Neurologic: No focal weakness or paresthesias are detected, Skin: There are no ulcer or rashes noted. Psychiatric: The patient has normal affect. Cardiovascular: There is a regular rate and rhythm without significant murmur appreciated.palpable bilateral radial and pedal pulses   Diagnostic Studies I have reviewed her CT scan from today which has the following findings: 1. Overall decrease in size of the descending thoracic aortic aneurysm sac since the last CTA of the chest on 10/31/2014. Comparable transverse dimensions have reduced from approximately 5.8 to 5.2 cm. 2. Visualization of a type 2 endoleak not seen on the prior CTA study. This appears to originate from retrograde in a small  vessel at the 9 o'clock position around the T6 level. This is favored to represent an intercostal artery or intercostal/bronchial arterial trunk. Flow in the aneurysm sac is seen coursing anterior to the endograft at this level. 3. Stable 10 mm right middle lobe nodule. 4. 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 5. 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 This aneurysm is densely calcified. 6. 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 7. Stable appearance of aortic graft without recurrent aneurysmal disease of the abdominal aorta. 8. Enlargement of lower pole left renal cyst which is still favored to represent a benign  cyst. 9. New area of relatively decreased perfusion of the antral lateral superior right kidney. Differential includes infarction versus Pyelonephritis.  Assessment: His post descending thoracic aortic stent graft Plan: The patient remains very stable.  There is a new type II endoleak on her scan, however the diameter has decreased.  Maximum diameter is 5.2 cm.  I have discussed the CT scan findings with the patient.  Overall she is doing very well.  I have elected to have her follow-up in one year with a repeat CT scan of her chest abdomen and pelvis  V. Charlena Cross, M.D. [facility] Office: [phone] Pager:  [phone]

## 2015-05-09 NOTE — Addendum Note (Signed)
Addended by: Adria DillELDRIDGE-LEWIS, Tracye Szuch L on: 05/09/2015 03:55 PM   Modules accepted: Orders

## 2015-06-20 ENCOUNTER — Other Ambulatory Visit: Payer: Self-pay | Admitting: Internal Medicine

## 2015-06-20 ENCOUNTER — Ambulatory Visit
Admission: RE | Admit: 2015-06-20 | Discharge: 2015-06-20 | Disposition: A | Discharge status: Home / Self Care | Payer: Medicare Other | Source: Ambulatory Visit | Attending: Internal Medicine | Admitting: Internal Medicine

## 2015-06-20 DIAGNOSIS — R0602 Shortness of breath: Secondary | ICD-10-CM

## 2015-06-22 ENCOUNTER — Emergency Department (HOSPITAL_COMMUNITY): Payer: Medicare Other

## 2015-06-22 ENCOUNTER — Emergency Department (HOSPITAL_COMMUNITY)
Admission: EM | Admit: 2015-06-22 | Discharge: 2015-06-22 | Disposition: A | Discharge status: Home / Self Care | Payer: Medicare Other | Attending: Emergency Medicine | Admitting: Emergency Medicine

## 2015-06-22 ENCOUNTER — Encounter (HOSPITAL_COMMUNITY): Payer: Self-pay | Admitting: *Deleted

## 2015-06-22 DIAGNOSIS — D649 Anemia, unspecified: Secondary | ICD-10-CM | POA: Insufficient documentation

## 2015-06-22 DIAGNOSIS — Z79899 Other long term (current) drug therapy: Secondary | ICD-10-CM | POA: Insufficient documentation

## 2015-06-22 DIAGNOSIS — K219 Gastro-esophageal reflux disease without esophagitis: Secondary | ICD-10-CM | POA: Diagnosis not present

## 2015-06-22 DIAGNOSIS — I10 Essential (primary) hypertension: Secondary | ICD-10-CM | POA: Diagnosis not present

## 2015-06-22 DIAGNOSIS — I509 Heart failure, unspecified: Secondary | ICD-10-CM | POA: Diagnosis present

## 2015-06-22 DIAGNOSIS — E119 Type 2 diabetes mellitus without complications: Secondary | ICD-10-CM | POA: Diagnosis not present

## 2015-06-22 LAB — CBC
HCT: 29.2 % — ABNORMAL LOW (ref 36.0–46.0)
HEMOGLOBIN: 9.2 g/dL — AB (ref 12.0–15.0)
MCH: 27.8 pg (ref 26.0–34.0)
MCHC: 31.5 g/dL (ref 30.0–36.0)
MCV: 88.2 fL (ref 78.0–100.0)
Platelets: 246 10*3/uL (ref 150–400)
RBC: 3.31 MIL/uL — ABNORMAL LOW (ref 3.87–5.11)
RDW: 16 % — AB (ref 11.5–15.5)
WBC: 6.3 10*3/uL (ref 4.0–10.5)

## 2015-06-22 LAB — BASIC METABOLIC PANEL
Anion gap: 12 (ref 5–15)
BUN: 22 mg/dL — AB (ref 6–20)
CO2: 22 mmol/L (ref 22–32)
Calcium: 9.4 mg/dL (ref 8.9–10.3)
Chloride: 103 mmol/L (ref 101–111)
Creatinine, Ser: 1.47 mg/dL — ABNORMAL HIGH (ref 0.44–1.00)
GFR calc Af Amer: 36 mL/min — ABNORMAL LOW (ref >60)
GFR calc non Af Amer: 31 mL/min — ABNORMAL LOW (ref >60)
Glucose, Bld: 112 mg/dL — ABNORMAL HIGH (ref 65–99)
Potassium: 3.3 mmol/L — ABNORMAL LOW (ref 3.5–5.1)
Sodium: 137 mmol/L (ref 135–145)

## 2015-06-22 LAB — BRAIN NATRIURETIC PEPTIDE: B NATRIURETIC PEPTIDE 5: 379.1 pg/mL — AB (ref 0.0–100.0)

## 2015-06-22 LAB — I-STAT TROPONIN, ED: Troponin i, poc: 0.01 ng/mL (ref 0.00–0.08)

## 2015-06-22 NOTE — ED Notes (Signed)
Pt's PCP told pt to come to ED to be evaluated for CHF. Pt states she had blood work drawn on Monday and today then was told to come to ED. Pt reports some mild chest pressure, shortness of breath at night, pt sleeps with two pillows. Pt denies n/v/d

## 2015-06-22 NOTE — ED Provider Notes (Signed)
CSN: [id number]     Arrival date & time 06/22/15  1729 History   First MD Initiated Contact with Patient 06/22/15 1757     Chief Complaint  Patient presents with  . Congestive Heart Failure     (Consider location/radiation/quality/duration/timing/severity/associated sxs/prior Treatment) HPI Comments: Patient is an 81 year old female with past medical history of type 2 diabetes, chronic renal insufficiency, and congestive heart failure. She presents for evaluation of progressive dyspnea over the past 2 days. Her symptoms are worse when she lies flat and exerts herself. She denies any chest pain and denies any fevers, chills, or productive cough.  She was seen by her primary doctor earlier in the week and had laboratory studies performed. She was instructed to increase her Lasix to 40 mg twice daily which she has done. She had a follow-up appointment this afternoon, then went home. Shortly after arriving home, she wrote daily phone call and instructed her to come to the ER to be evaluated. She is uncertain as to why and what their concern was.  Patient is a 81 y.o. female presenting with CHF. The history is provided by the patient.  Congestive Heart Failure This is a recurrent problem. The current episode started 2 days ago. The problem occurs constantly. The problem has been gradually worsening. Associated symptoms include shortness of breath. Pertinent negatives include no chest pain. Exacerbated by: Position, lying flat. Nothing relieves the symptoms. She has tried nothing for the symptoms. The treatment provided no relief.    Past Medical History  Diagnosis Date  . Diabetes mellitus without complication   . Hypertension   . AAA (abdominal aortic aneurysm)-repaired   . Irregular heart beat   . Acute bronchitis   . Tachycardia     Unspecified  . Shortness of breath dyspnea   . GERD (gastroesophageal reflux disease)   . Anemia   . CHF (congestive heart failure)    Past Surgical  History  Procedure Laterality Date  . Abdominal hysterectomy      40 years ago, intestinal damage  . Abdominal aortic aneurysm repair  2012    Dr. Arbie Cookey  . Breast lumpectomy Left     Benign Mass  . Thoracic aortic endovascular stent graft N/A 09/27/2014    Procedure: THORACIC AORTIC ENDOVASCULAR STENT GRAFT;  Surgeon: Nada Libman, MD;  Location: [facility] OR;  Service: Vascular;  Laterality: N/A;  . Oophorectomy      40 years ago  . Incision and drainage abscess N/A 02/28/2015    Procedure: INCISION AND DRAINAGE ABSCESS ABDOMINAL WALL;  Surgeon: Claud Kelp, MD;  Location: [facility] OR;  Service: General;  Laterality: N/A;   Family History  Problem Relation Age of Onset  . Heart disease Sister   . Cancer Brother    History  Substance Use Topics  . Smoking status: Never Smoker   . Smokeless tobacco: Never Used     Comment: quit smoking in 1975  . Alcohol Use: No   OB History    No data available     Review of Systems  Respiratory: Positive for shortness of breath.   Cardiovascular: Negative for chest pain.  All other systems reviewed and are negative.     Allergies  Sulfur and Tape  Home Medications   Prior to Admission medications   Medication Sig Start Date End Date Taking? Authorizing Provider  amLODipine (NORVASC) 10 MG tablet Take 10 mg by mouth daily.   Yes Historical Provider, MD  atorvastatin (LIPITOR) 20 MG tablet Take  1 tablet (20 mg total) by mouth daily at 6 PM. Patient taking differently: Take 20 mg by mouth daily.  09/12/14  Yes Rhonda G Barrett, PA-C  calcium carbonate (OS-CAL) 600 MG TABS tablet Take 600 mg by mouth 2 (two) times daily with a meal. Takes in the morning and at lunch time   Yes Historical Provider, MD  furosemide (LASIX) 20 MG tablet Take 1 tablet (20 mg total) by mouth daily. Patient taking differently: Take 20 mg by mouth 2 (two) times daily.  04/04/15  Yes Penny Pia, MD  gemfibrozil (LOPID) 600 MG tablet Take 600 mg by mouth 2 (two) times  daily. 01/12/15  Yes Historical Provider, MD  glipiZIDE (GLIPIZIDE XL) 2.5 MG 24 hr tablet Take 1 tablet (2.5 mg total) by mouth daily with breakfast. 04/04/15  Yes Penny Pia, MD  labetalol (NORMODYNE) 100 MG tablet Take 1 tablet (100 mg total) by mouth 2 (two) times daily. 04/04/15  Yes Penny Pia, MD  Nutritional Supplements (JUICE PLUS FIBRE PO) Take 3 tablets by mouth daily.    Yes Historical Provider, MD  omeprazole (PRILOSEC) 40 MG capsule Take 40 mg by mouth daily. 03/17/15  Yes Historical Provider, MD  Polyethyl Glycol-Propyl Glycol (SYSTANE OP) Place 1 drop into both eyes 2 (two) times daily.    Yes Historical Provider, MD  Vitamin D, Ergocalciferol, (DRISDOL) 50000 UNITS CAPS capsule Take 50,000 Units by mouth every Saturday.    Yes Historical Provider, MD  ferrous sulfate 325 (65 FE) MG tablet Take 1 tablet (325 mg total) by mouth 2 (two) times daily with a meal. Patient not taking: Reported on 06/22/2015 03/04/15   Catarina Hartshorn, MD  KLOR-CON M20 20 MEQ tablet Take 20 mEq by mouth daily. 06/22/15   Historical Provider, MD   BP 141/57 mmHg  Pulse 70  Temp(Src) 98 F (36.7 C) (Oral)  Resp 20  Ht 5\' 2"  (1.575 m)  Wt 129 lb (58.514 kg)  BMI 23.59 kg/m2  SpO2 95% Physical Exam  Constitutional: She is oriented to person, place, and time. She appears well-developed and well-nourished. No distress.  HENT:  Head: Normocephalic and atraumatic.  Neck: Normal range of motion. Neck supple.  Cardiovascular: Normal rate and regular rhythm.  Exam reveals no gallop and no friction rub.   No murmur heard. Pulmonary/Chest: Effort normal and breath sounds normal. No respiratory distress. She has no wheezes. She has no rales.  Abdominal: Soft. Bowel sounds are normal. She exhibits no distension. There is no tenderness.  Musculoskeletal: Normal range of motion. She exhibits no edema.  Neurological: She is alert and oriented to person, place, and time.  Skin: Skin is warm and dry. She is not diaphoretic.   Nursing note and vitals reviewed.   ED Course  Procedures (including critical care time) Labs Review Labs Reviewed  BASIC METABOLIC PANEL - Abnormal; Notable for the following:    Potassium 3.3 (*)    Glucose, Bld 112 (*)    BUN 22 (*)    Creatinine, Ser 1.47 (*)    GFR calc non Af Amer 31 (*)    GFR calc Af Amer 36 (*)    All other components within normal limits  CBC - Abnormal; Notable for the following:    RBC 3.31 (*)    Hemoglobin 9.2 (*)    HCT 29.2 (*)    RDW 16.0 (*)    All other components within normal limits  BRAIN NATRIURETIC PEPTIDE - Abnormal; Notable for the following:  B Natriuretic Peptide 379.1 (*)    All other components within normal limits  Rosezena Sensor, ED    Imaging Review Dg Chest 2 View  06/22/2015   CLINICAL DATA:  Chest pain.  Weakness and shortness of Breath  EXAM: CHEST  2 VIEW  COMPARISON:  06/20/2015  FINDINGS: The metallic scaffolding from a thoracic stent graft is again noted and appears stable in position. Heart size is mildly enlarged. No pleural effusion or edema identified. No airspace consolidation identified.  IMPRESSION: No active cardiopulmonary disease.   Electronically Signed   By: Signa Kell M.D.   On: 06/22/2015 18:29    ED ECG REPORT   Date: 06/22/2015  Rate: 70  Rhythm: sinus tachycardia  QRS Axis: left  Intervals: normal  ST/T Wave abnormalities: nonspecific T wave changes  Conduction Disutrbances:nonspecific intraventricular conduction delay  Narrative Interpretation:   Old EKG Reviewed: unchanged  I have personally reviewed the EKG tracing and agree with the computerized printout as noted.   MDM   Final diagnoses:  None    Patient presents here with complaints of orthopnea over the past week. She's been seen by her primary Dr. and had laboratory studies performed. They told her to come here to be evaluated today for ongoing symptoms. She appears very comfortable, vitals are stable, and there is no  hypoxia. Her workup reveals negative troponin, mild renal insufficiency, mild anemia, and BNP of 379 with chest x-ray not suggestive of cardiopulmonary disease. I see nothing emergent that would require admission at this time. She has been on an increased dose of Lasix and I will have her continue this for the next 2-3 days until which time she can follow-up with her primary doctor.    Geoffery Lyons, MD 06/22/15 2049

## 2015-06-22 NOTE — ED Notes (Signed)
Pt verbalized understanding of d/c instructions and has no further questions. Pt stable and in NAD upon d/c.  

## 2015-06-22 NOTE — Discharge Instructions (Signed)
Continue your Lasix 40 mg twice daily for the next 2-3 days.  Return to the ER symptoms significantly worsen or change.   Heart Failure Heart failure is a condition in which the heart has trouble pumping blood. This means your heart does not pump blood efficiently for your body to work well. In some cases of heart failure, fluid may back up into your lungs or you may have swelling (edema) in your lower legs. Heart failure is usually a long-term (chronic) condition. It is important for you to take good care of yourself and follow your health care provider's treatment plan. CAUSES  Some health conditions can cause heart failure. Those health conditions include:  High blood pressure (hypertension). Hypertension causes the heart muscle to work harder than normal. When pressure in the blood vessels is high, the heart needs to pump (contract) with more force in order to circulate blood throughout the body. High blood pressure eventually causes the heart to become stiff and weak.  Coronary artery disease (CAD). CAD is the buildup of cholesterol and fat (plaque) in the arteries of the heart. The blockage in the arteries deprives the heart muscle of oxygen and blood. This can cause chest pain and may lead to a heart attack. High blood pressure can also contribute to CAD.  Heart attack (myocardial infarction). A heart attack occurs when one or more arteries in the heart become blocked. The loss of oxygen damages the muscle tissue of the heart. When this happens, part of the heart muscle dies. The injured tissue does not contract as well and weakens the heart's ability to pump blood.  Abnormal heart valves. When the heart valves do not open and close properly, it can cause heart failure. This makes the heart muscle pump harder to keep the blood flowing.  Heart muscle disease (cardiomyopathy or myocarditis). Heart muscle disease is damage to the heart muscle from a variety of causes. These can include drug or  alcohol abuse, infections, or unknown reasons. These can increase the risk of heart failure.  Lung disease. Lung disease makes the heart work harder because the lungs do not work properly. This can cause a strain on the heart, leading it to fail.  Diabetes. Diabetes increases the risk of heart failure. High blood sugar contributes to high fat (lipid) levels in the blood. Diabetes can also cause slow damage to tiny blood vessels that carry important nutrients to the heart muscle. When the heart does not get enough oxygen and food, it can cause the heart to become weak and stiff. This leads to a heart that does not contract efficiently.  Other conditions can contribute to heart failure. These include abnormal heart rhythms, thyroid problems, and low blood counts (anemia). Certain unhealthy behaviors can increase the risk of heart failure, including:  Being overweight.  Smoking or chewing tobacco.  Eating foods high in fat and cholesterol.  Abusing illicit drugs or alcohol.  Lacking physical activity. SYMPTOMS  Heart failure symptoms may vary and can be hard to detect. Symptoms may include:  Shortness of breath with activity, such as climbing stairs.  Persistent cough.  Swelling of the feet, ankles, legs, or abdomen.  Unexplained weight gain.  Difficulty breathing when lying flat (orthopnea).  Waking from sleep because of the need to sit up and get more air.  Rapid heartbeat.  Fatigue and loss of energy.  Feeling light-headed, dizzy, or close to fainting.  Loss of appetite.  Nausea.  Increased urination during the night (nocturia). DIAGNOSIS  A diagnosis of heart failure is based on your history, symptoms, physical examination, and diagnostic tests. Diagnostic tests for heart failure may include:  Echocardiography.  Electrocardiography.  Chest X-ray.  Blood tests.  Exercise stress test.  Cardiac angiography.  Radionuclide scans. TREATMENT  Treatment is aimed  at managing the symptoms of heart failure. Medicines, behavioral changes, or surgical intervention may be necessary to treat heart failure.  Medicines to help treat heart failure may include:  Angiotensin-converting enzyme (ACE) inhibitors. This type of medicine blocks the effects of a blood protein called angiotensin-converting enzyme. ACE inhibitors relax (dilate) the blood vessels and help lower blood pressure.  Angiotensin receptor blockers (ARBs). This type of medicine blocks the actions of a blood protein called angiotensin. Angiotensin receptor blockers dilate the blood vessels and help lower blood pressure.  Water pills (diuretics). Diuretics cause the kidneys to remove salt and water from the blood. The extra fluid is removed through urination. This loss of extra fluid lowers the volume of blood the heart pumps.  Beta blockers. These prevent the heart from beating too fast and improve heart muscle strength.  Digitalis. This increases the force of the heartbeat.  Healthy behavior changes include:  Obtaining and maintaining a healthy weight.  Stopping smoking or chewing tobacco.  Eating heart-healthy foods.  Limiting or avoiding alcohol.  Stopping illicit drug use.  Physical activity as directed by your health care provider.  Surgical treatment for heart failure may include:  A procedure to open blocked arteries, repair damaged heart valves, or remove damaged heart muscle tissue.  A pacemaker to improve heart muscle function and control certain abnormal heart rhythms.  An internal cardioverter defibrillator to treat certain serious abnormal heart rhythms.  A left ventricular assist device (LVAD) to assist the pumping ability of the heart. HOME CARE INSTRUCTIONS   Take medicines only as directed by your health care provider. Medicines are important in reducing the workload of your heart, slowing the progression of heart failure, and improving your symptoms.  Do not stop  taking your medicine unless directed by your health care provider.  Do not skip any dose of medicine.  Refill your prescriptions before you run out of medicine. Your medicines are needed every day.  Engage in moderate physical activity if directed by your health care provider. Moderate physical activity can benefit some people. The elderly and people with severe heart failure should consult with a health care provider for physical activity recommendations.  Eat heart-healthy foods. Food choices should be free of trans fat and low in saturated fat, cholesterol, and salt (sodium). Healthy choices include fresh or frozen fruits and vegetables, fish, lean meats, legumes, fat-free or low-fat dairy products, and whole grain or high fiber foods. Talk to a dietitian to learn more about heart-healthy foods.  Limit sodium if directed by your health care provider. Sodium restriction may reduce symptoms of heart failure in some people. Talk to a dietitian to learn more about heart-healthy seasonings.  Use healthy cooking methods. Healthy cooking methods include roasting, grilling, broiling, baking, poaching, steaming, or stir-frying. Talk to a dietitian to learn more about healthy cooking methods.  Limit fluids if directed by your health care provider. Fluid restriction may reduce symptoms of heart failure in some people.  Weigh yourself every day. Daily weights are important in the early recognition of excess fluid. You should weigh yourself every morning after you urinate and before you eat breakfast. Wear the same amount of clothing each time you weigh yourself. Record your  daily weight. Provide your health care provider with your weight record.  Monitor and record your blood pressure if directed by your health care provider.  Check your pulse if directed by your health care provider.  Lose weight if directed by your health care provider. Weight loss may reduce symptoms of heart failure in some  people.  Stop smoking or chewing tobacco. Nicotine makes your heart work harder by causing your blood vessels to constrict. Do not use nicotine gum or patches before talking to your health care provider.  Keep all follow-up visits as directed by your health care provider. This is important.  Limit alcohol intake to no more than 1 drink per day for nonpregnant women and 2 drinks per day for men. One drink equals 12 ounces of beer, 5 ounces of wine, or 1 ounces of hard liquor. Drinking more than that is harmful to your heart. Tell your health care provider if you drink alcohol several times a week. Talk with your health care provider about whether alcohol is safe for you. If your heart has already been damaged by alcohol or you have severe heart failure, drinking alcohol should be stopped completely.  Stop illicit drug use.  Stay up-to-date with immunizations. It is especially important to prevent respiratory infections through current pneumococcal and influenza immunizations.  Manage other health conditions such as hypertension, diabetes, thyroid disease, or abnormal heart rhythms as directed by your health care provider.  Learn to manage stress.  Plan rest periods when fatigued.  Learn strategies to manage high temperatures. If the weather is extremely hot:  Avoid vigorous physical activity.  Use air conditioning or fans or seek a cooler location.  Avoid caffeine and alcohol.  Wear loose-fitting, lightweight, and light-colored clothing.  Learn strategies to manage cold temperatures. If the weather is extremely cold:  Avoid vigorous physical activity.  Layer clothes.  Wear mittens or gloves, a hat, and a scarf when going outside.  Avoid alcohol.  Obtain ongoing education and support as needed.  Participate in or seek rehabilitation as needed to maintain or improve independence and quality of life. SEEK MEDICAL CARE IF:   Your weight increases by 03 lb/1.4 kg in 1 day or 05  lb/2.3 kg in a week.  You have increasing shortness of breath that is unusual for you.  You are unable to participate in your usual physical activities.  You tire easily.  You cough more than normal, especially with physical activity.  You have any or more swelling in areas such as your hands, feet, ankles, or abdomen.  You are unable to sleep because it is hard to breathe.  You feel like your heart is beating fast (palpitations).  You become dizzy or light-headed upon standing up. SEEK IMMEDIATE MEDICAL CARE IF:   You have difficulty breathing.  There is a change in mental status such as decreased alertness or difficulty with concentration.  You have a pain or discomfort in your chest.  You have an episode of fainting (syncope). MAKE SURE YOU:   Understand these instructions.  Will watch your condition.  Will get help right away if you are not doing well or get worse. Document Released: 11/18/2005 Document Revised: 04/04/2014 Document Reviewed: 12/18/2012 [facility] Patient Information 2015 [place], [place]. This information is not intended to replace advice given to you by your health care provider. Make sure you discuss any questions you have with your health care provider.

## 2015-08-22 ENCOUNTER — Ambulatory Visit (INDEPENDENT_AMBULATORY_CARE_PROVIDER_SITE_OTHER): Payer: Medicare Other | Admitting: Internal Medicine

## 2015-08-22 ENCOUNTER — Encounter: Payer: Self-pay | Admitting: Internal Medicine

## 2015-08-22 VITALS — BP 140/66 | HR 63 | Ht 63.0 in | Wt 120.0 lb

## 2015-08-22 DIAGNOSIS — I5032 Chronic diastolic (congestive) heart failure: Secondary | ICD-10-CM | POA: Diagnosis not present

## 2015-08-22 NOTE — Patient Instructions (Signed)
Medication Instructions:  Your physician recommends that you continue on your current medications as directed. Please refer to the Current Medication list given to you today.  Labwork: None ordered  Testing/Procedures: None ordered  Follow-Up: Your physician wants you to follow-up in: 6 months with Dr. Eldridge Dace.  You will receive a reminder letter in the mail two months in advance. If you don't receive a letter, please call our office to schedule the follow-up appointment.  Any Other Special Instructions Will Be Listed Below (If Applicable). Thank you for choosing [facility]!!

## 2015-08-22 NOTE — Progress Notes (Signed)
Patient Care Team: Kristie Cowman, MD as PCP - General (Internal Medicine) Vida Rigger, MD as Consulting Physician (Gastroenterology) Cassell Clement, MD as Consulting Physician (Cardiology) Oneal Grout, MD as Consulting Physician (Internal Medicine)   HPI  Jennifer [name] is a 81 y.o. female Seen at request of her PCP    She was hospitalized in 5/16 - records reviewed  For A/C HFpEF  Over recent months no DOE CP PND orthopnea  Records and Results Reviewed* ECHO  EF 55% Mod LAE  Past Medical History  Diagnosis Date  . Diabetes mellitus without complication   . Hypertension   . AAA (abdominal aortic aneurysm)-repaired   . Irregular heart beat   . Acute bronchitis   . Tachycardia     Unspecified  . Shortness of breath dyspnea   . GERD (gastroesophageal reflux disease)   . Anemia   . CHF (congestive heart failure)     Past Surgical History  Procedure Laterality Date  . Abdominal hysterectomy      40 years ago, intestinal damage  . Abdominal aortic aneurysm repair  2012    Dr. Arbie Cookey  . Breast lumpectomy Left     Benign Mass  . Thoracic aortic endovascular stent graft N/A 09/27/2014    Procedure: THORACIC AORTIC ENDOVASCULAR STENT GRAFT;  Surgeon: Nada Libman, MD;  Location: [facility] OR;  Service: Vascular;  Laterality: N/A;  . Oophorectomy      40 years ago  . Incision and drainage abscess N/A 02/28/2015    Procedure: INCISION AND DRAINAGE ABSCESS ABDOMINAL WALL;  Surgeon: Claud Kelp, MD;  Location: [facility] OR;  Service: General;  Laterality: N/A;    Current Outpatient Prescriptions  Medication Sig Dispense Refill  . amLODipine (NORVASC) 10 MG tablet Take 10 mg by mouth daily.    Marland Kitchen atorvastatin (LIPITOR) 20 MG tablet Take 1 tablet (20 mg total) by mouth daily at 6 PM. (Patient taking differently: Take 20 mg by mouth daily. ) 30 tablet 11  . calcium carbonate (OS-CAL) 600 MG TABS tablet Take 600 mg by mouth 2 (two) times daily with a meal. Takes in the morning  and at lunch time    . [employer] STOOL SOFTENER 100 MG capsule Take 100 mg by mouth daily.  5  . ferrous sulfate 325 (65 FE) MG tablet Take 1 tablet (325 mg total) by mouth 2 (two) times daily with a meal. 60 tablet 0  . furosemide (LASIX) 20 MG tablet Take 1 tablet (20 mg total) by mouth daily. (Patient taking differently: Take 20 mg by mouth 2 (two) times daily. ) 30 tablet 0  . gemfibrozil (LOPID) 600 MG tablet Take 600 mg by mouth 2 (two) times daily.  5  . glipiZIDE (GLIPIZIDE XL) 2.5 MG 24 hr tablet Take 1 tablet (2.5 mg total) by mouth daily with breakfast. 30 tablet 0  . KLOR-CON M20 20 MEQ tablet Take 20 mEq by mouth daily.  2  . labetalol (NORMODYNE) 100 MG tablet Take 1 tablet (100 mg total) by mouth 2 (two) times daily. 60 tablet 0  . Nutritional Supplements (JUICE PLUS FIBRE PO) Take 3 tablets by mouth daily.     Marland Kitchen omeprazole (PRILOSEC) 40 MG capsule Take 40 mg by mouth daily.  2  . Polyethyl Glycol-Propyl Glycol (SYSTANE OP) Place 1 drop into both eyes 2 (two) times daily.     . Vitamin D, Ergocalciferol, (DRISDOL) 50000 UNITS CAPS capsule Take 50,000 Units by mouth every Saturday.  No current facility-administered medications for this visit.    Allergies  Allergen Reactions  . Sulfur     Unknown reaction   . Tape Rash and Other (See Comments)    "paper" tape please, adhesive tape "burns'      Review of Systems negative except from HPI and PMH  Physical Exam BP 140/66 mmHg  Pulse 63  Ht  (1.6 m)  Wt 120 lb (54.432 kg)  BMI 21.26 kg/m2 Well developed and well nourished in no acute distress HENT normal E scleral and icterus clear Neck Supple JVP flat; carotids brisk and full Clear to ausculation  Regular rate and rhythm, no murmurs gallops or rub Soft with active bowel sounds No clubbing cyanosis   Edema Alert and oriented, grossly normal motor and sensory function Skin Warm and Dry  ECG 16/13/42  -68  Assessment and  Plan HFpEF--  resolved  HTN  euvolemic   Continue current meds  We spent more than 50% of our >25 min visit in face to face counseling regarding the above

## 2015-10-08 ENCOUNTER — Encounter (HOSPITAL_COMMUNITY): Payer: Self-pay | Admitting: *Deleted

## 2015-10-08 ENCOUNTER — Inpatient Hospital Stay (HOSPITAL_COMMUNITY)
Admission: EM | Admit: 2015-10-08 | Discharge: 2015-10-10 | DRG: 378 | Disposition: A | Discharge status: Home / Self Care | Payer: Medicare Other | Attending: Internal Medicine | Admitting: Internal Medicine

## 2015-10-08 ENCOUNTER — Inpatient Hospital Stay (HOSPITAL_COMMUNITY): Payer: Medicare Other

## 2015-10-08 DIAGNOSIS — K922 Gastrointestinal hemorrhage, unspecified: Secondary | ICD-10-CM

## 2015-10-08 DIAGNOSIS — N183 Chronic kidney disease, stage 3 unspecified: Secondary | ICD-10-CM | POA: Diagnosis present

## 2015-10-08 DIAGNOSIS — K649 Unspecified hemorrhoids: Secondary | ICD-10-CM | POA: Diagnosis present

## 2015-10-08 DIAGNOSIS — Z882 Allergy status to sulfonamides status: Secondary | ICD-10-CM

## 2015-10-08 DIAGNOSIS — N390 Urinary tract infection, site not specified: Secondary | ICD-10-CM | POA: Diagnosis present

## 2015-10-08 DIAGNOSIS — I5032 Chronic diastolic (congestive) heart failure: Secondary | ICD-10-CM | POA: Diagnosis present

## 2015-10-08 DIAGNOSIS — Z91048 Other nonmedicinal substance allergy status: Secondary | ICD-10-CM

## 2015-10-08 DIAGNOSIS — Z8249 Family history of ischemic heart disease and other diseases of the circulatory system: Secondary | ICD-10-CM

## 2015-10-08 DIAGNOSIS — Z8719 Personal history of other diseases of the digestive system: Secondary | ICD-10-CM | POA: Diagnosis not present

## 2015-10-08 DIAGNOSIS — I5030 Unspecified diastolic (congestive) heart failure: Secondary | ICD-10-CM | POA: Diagnosis present

## 2015-10-08 DIAGNOSIS — K2970 Gastritis, unspecified, without bleeding: Secondary | ICD-10-CM | POA: Diagnosis present

## 2015-10-08 DIAGNOSIS — D62 Acute posthemorrhagic anemia: Secondary | ICD-10-CM | POA: Diagnosis present

## 2015-10-08 DIAGNOSIS — Z87891 Personal history of nicotine dependence: Secondary | ICD-10-CM

## 2015-10-08 DIAGNOSIS — E119 Type 2 diabetes mellitus without complications: Secondary | ICD-10-CM

## 2015-10-08 DIAGNOSIS — K5791 Diverticulosis of intestine, part unspecified, without perforation or abscess with bleeding: Secondary | ICD-10-CM | POA: Diagnosis present

## 2015-10-08 DIAGNOSIS — D649 Anemia, unspecified: Secondary | ICD-10-CM | POA: Diagnosis present

## 2015-10-08 DIAGNOSIS — K449 Diaphragmatic hernia without obstruction or gangrene: Secondary | ICD-10-CM | POA: Diagnosis present

## 2015-10-08 DIAGNOSIS — D500 Iron deficiency anemia secondary to blood loss (chronic): Secondary | ICD-10-CM | POA: Diagnosis present

## 2015-10-08 DIAGNOSIS — I130 Hypertensive heart and chronic kidney disease with heart failure and stage 1 through stage 4 chronic kidney disease, or unspecified chronic kidney disease: Secondary | ICD-10-CM | POA: Diagnosis present

## 2015-10-08 DIAGNOSIS — B961 Klebsiella pneumoniae [K. pneumoniae] as the cause of diseases classified elsewhere: Secondary | ICD-10-CM | POA: Diagnosis present

## 2015-10-08 DIAGNOSIS — Z7984 Long term (current) use of oral hypoglycemic drugs: Secondary | ICD-10-CM | POA: Diagnosis not present

## 2015-10-08 DIAGNOSIS — E1122 Type 2 diabetes mellitus with diabetic chronic kidney disease: Secondary | ICD-10-CM | POA: Diagnosis present

## 2015-10-08 DIAGNOSIS — Z9889 Other specified postprocedural states: Secondary | ICD-10-CM

## 2015-10-08 DIAGNOSIS — Z95828 Presence of other vascular implants and grafts: Secondary | ICD-10-CM

## 2015-10-08 DIAGNOSIS — I712 Thoracic aortic aneurysm, without rupture: Secondary | ICD-10-CM | POA: Diagnosis present

## 2015-10-08 DIAGNOSIS — K921 Melena: Secondary | ICD-10-CM | POA: Diagnosis present

## 2015-10-08 DIAGNOSIS — Z79899 Other long term (current) drug therapy: Secondary | ICD-10-CM

## 2015-10-08 DIAGNOSIS — Z90710 Acquired absence of both cervix and uterus: Secondary | ICD-10-CM | POA: Diagnosis not present

## 2015-10-08 DIAGNOSIS — K219 Gastro-esophageal reflux disease without esophagitis: Secondary | ICD-10-CM | POA: Diagnosis present

## 2015-10-08 DIAGNOSIS — I10 Essential (primary) hypertension: Secondary | ICD-10-CM | POA: Diagnosis present

## 2015-10-08 DIAGNOSIS — Z8679 Personal history of other diseases of the circulatory system: Secondary | ICD-10-CM

## 2015-10-08 LAB — BASIC METABOLIC PANEL
ANION GAP: 9 (ref 5–15)
BUN: 24 mg/dL — AB (ref 6–20)
CALCIUM: 9.4 mg/dL (ref 8.9–10.3)
CO2: 24 mmol/L (ref 22–32)
Chloride: 108 mmol/L (ref 101–111)
Creatinine, Ser: 1.37 mg/dL — ABNORMAL HIGH (ref 0.44–1.00)
GFR calc Af Amer: 39 mL/min — ABNORMAL LOW (ref >60)
GFR calc non Af Amer: 33 mL/min — ABNORMAL LOW (ref >60)
GLUCOSE: 100 mg/dL — AB (ref 65–99)
Potassium: 4.1 mmol/L (ref 3.5–5.1)
Sodium: 141 mmol/L (ref 135–145)

## 2015-10-08 LAB — COMPREHENSIVE METABOLIC PANEL
ALK PHOS: 73 U/L (ref 38–126)
ALT: 9 U/L — AB (ref 14–54)
AST: 14 U/L — ABNORMAL LOW (ref 15–41)
Albumin: 3.4 g/dL — ABNORMAL LOW (ref 3.5–5.0)
Anion gap: 10 (ref 5–15)
BUN: 27 mg/dL — ABNORMAL HIGH (ref 6–20)
CALCIUM: 9.7 mg/dL (ref 8.9–10.3)
CO2: 23 mmol/L (ref 22–32)
CREATININE: 1.41 mg/dL — AB (ref 0.44–1.00)
Chloride: 108 mmol/L (ref 101–111)
GFR calc non Af Amer: 32 mL/min — ABNORMAL LOW (ref >60)
GFR, EST AFRICAN AMERICAN: 37 mL/min — AB (ref >60)
GLUCOSE: 119 mg/dL — AB (ref 65–99)
Potassium: 4.3 mmol/L (ref 3.5–5.1)
SODIUM: 141 mmol/L (ref 135–145)
Total Bilirubin: 0.4 mg/dL (ref 0.3–1.2)
Total Protein: 6.7 g/dL (ref 6.5–8.1)

## 2015-10-08 LAB — GLUCOSE, CAPILLARY
GLUCOSE-CAPILLARY: 91 mg/dL (ref 65–99)
GLUCOSE-CAPILLARY: 70 mg/dL (ref 65–99)
Glucose-Capillary: 110 mg/dL — ABNORMAL HIGH (ref 65–99)
Glucose-Capillary: 136 mg/dL — ABNORMAL HIGH (ref 65–99)
Glucose-Capillary: 84 mg/dL (ref 65–99)

## 2015-10-08 LAB — CBC
HCT: 30.6 % — ABNORMAL LOW (ref 36.0–46.0)
HEMATOCRIT: 28.2 % — AB (ref 36.0–46.0)
HEMOGLOBIN: 8.5 g/dL — AB (ref 12.0–15.0)
Hemoglobin: 9.4 g/dL — ABNORMAL LOW (ref 12.0–15.0)
MCH: 27.2 pg (ref 26.0–34.0)
MCH: 26.6 pg (ref 26.0–34.0)
MCHC: 30.7 g/dL (ref 30.0–36.0)
MCHC: 30.1 g/dL (ref 30.0–36.0)
MCV: 88.4 fL (ref 78.0–100.0)
MCV: 88.4 fL (ref 78.0–100.0)
PLATELETS: 199 10*3/uL (ref 150–400)
Platelets: 167 10*3/uL (ref 150–400)
RBC: 3.46 MIL/uL — ABNORMAL LOW (ref 3.87–5.11)
RBC: 3.19 MIL/uL — ABNORMAL LOW (ref 3.87–5.11)
RDW: 20 % — ABNORMAL HIGH (ref 11.5–15.5)
RDW: 19.8 % — AB (ref 11.5–15.5)
WBC: 5.9 10*3/uL (ref 4.0–10.5)
WBC: 6.4 10*3/uL (ref 4.0–10.5)

## 2015-10-08 LAB — LIPASE, BLOOD: Lipase: 45 U/L (ref 11–51)

## 2015-10-08 LAB — URINALYSIS, ROUTINE W REFLEX MICROSCOPIC
BILIRUBIN URINE: NEGATIVE
GLUCOSE, UA: NEGATIVE mg/dL
KETONES UR: NEGATIVE mg/dL
Nitrite: NEGATIVE
PROTEIN: NEGATIVE mg/dL
Specific Gravity, Urine: 1.017 (ref 1.005–1.030)
Urobilinogen, UA: 0.2 mg/dL (ref 0.0–1.0)
pH: 5.5 (ref 5.0–8.0)

## 2015-10-08 LAB — CBG MONITORING, ED: Glucose-Capillary: 96 mg/dL (ref 65–99)

## 2015-10-08 LAB — HEMOGLOBIN AND HEMATOCRIT, BLOOD
HCT: 27.6 % — ABNORMAL LOW (ref 36.0–46.0)
HEMATOCRIT: 30.4 % — AB (ref 36.0–46.0)
HEMATOCRIT: 28.1 % — AB (ref 36.0–46.0)
HEMOGLOBIN: 8.6 g/dL — AB (ref 12.0–15.0)
Hemoglobin: 8.7 g/dL — ABNORMAL LOW (ref 12.0–15.0)
Hemoglobin: 9.3 g/dL — ABNORMAL LOW (ref 12.0–15.0)

## 2015-10-08 LAB — TYPE AND SCREEN
ABO/RH(D): O POS
ANTIBODY SCREEN: NEGATIVE

## 2015-10-08 LAB — URINE MICROSCOPIC-ADD ON

## 2015-10-08 LAB — SAMPLE TO BLOOD BANK

## 2015-10-08 MED ORDER — ALUM & MAG HYDROXIDE-SIMETH 200-200-20 MG/5ML PO SUSP: 30.0000 mL | Freq: Four times a day (QID) | ORAL | Status: DC | PRN

## 2015-10-08 MED ORDER — OXYCODONE HCL 5 MG PO TABS: 5.0000 mg | ORAL_TABLET | ORAL | Status: DC | PRN

## 2015-10-08 MED ORDER — INSULIN ASPART 100 UNIT/ML ~~LOC~~ SOLN
0.0000 [IU] | SUBCUTANEOUS | Status: DC
  Administered 2015-10-08 – 2015-10-10 (×4): 1 [IU] via SUBCUTANEOUS

## 2015-10-08 MED ORDER — ACETAMINOPHEN 650 MG RE SUPP: 650.0000 mg | Freq: Four times a day (QID) | RECTAL | Status: DC | PRN

## 2015-10-08 MED ORDER — AMLODIPINE BESYLATE 10 MG PO TABS
10.0000 mg | ORAL_TABLET | Freq: Every day | ORAL | Status: DC
  Administered 2015-10-08 – 2015-10-10 (×3): 10 mg via ORAL
  Filled 2015-10-08 (×3): qty 1

## 2015-10-08 MED ORDER — ONDANSETRON HCL 4 MG/2ML IJ SOLN: 4.0000 mg | Freq: Four times a day (QID) | INTRAMUSCULAR | Status: DC | PRN

## 2015-10-08 MED ORDER — PANTOPRAZOLE SODIUM 40 MG IV SOLR: 40.0000 mg | Freq: Two times a day (BID) | INTRAVENOUS | Status: DC

## 2015-10-08 MED ORDER — LABETALOL HCL 100 MG PO TABS
100.0000 mg | ORAL_TABLET | Freq: Two times a day (BID) | ORAL | Status: DC
  Administered 2015-10-08 – 2015-10-10 (×5): 100 mg via ORAL
  Filled 2015-10-08 (×6): qty 1

## 2015-10-08 MED ORDER — HYDROMORPHONE HCL 1 MG/ML IJ SOLN: 0.5000 mg | INTRAMUSCULAR | Status: DC | PRN

## 2015-10-08 MED ORDER — DEXTROSE 5 % IV SOLN
1.0000 g | INTRAVENOUS | Status: DC
  Administered 2015-10-08 – 2015-10-09 (×2): 1 g via INTRAVENOUS
  Filled 2015-10-08 (×3): qty 10

## 2015-10-08 MED ORDER — IOHEXOL 350 MG/ML SOLN
80.0000 mL | Freq: Once | INTRAVENOUS | Status: AC | PRN
  Administered 2015-10-08: 100 mL via INTRAVENOUS

## 2015-10-08 MED ORDER — SODIUM CHLORIDE 0.9 % IJ SOLN
3.0000 mL | Freq: Two times a day (BID) | INTRAMUSCULAR | Status: DC
  Administered 2015-10-08 – 2015-10-09 (×3): 3 mL via INTRAVENOUS

## 2015-10-08 MED ORDER — ACETAMINOPHEN 325 MG PO TABS
650.0000 mg | ORAL_TABLET | Freq: Four times a day (QID) | ORAL | Status: DC | PRN
  Administered 2015-10-09: 650 mg via ORAL
  Filled 2015-10-08: qty 2

## 2015-10-08 MED ORDER — PANTOPRAZOLE SODIUM 40 MG IV SOLR
40.0000 mg | Freq: Two times a day (BID) | INTRAVENOUS | Status: DC
  Administered 2015-10-08 – 2015-10-10 (×5): 40 mg via INTRAVENOUS
  Filled 2015-10-08 (×6): qty 40

## 2015-10-08 MED ORDER — SODIUM CHLORIDE 0.9 % IV SOLN
INTRAVENOUS | Status: DC
  Administered 2015-10-08: 05:00:00 via INTRAVENOUS

## 2015-10-08 MED ORDER — ONDANSETRON HCL 4 MG PO TABS: 4.0000 mg | ORAL_TABLET | Freq: Four times a day (QID) | ORAL | Status: DC | PRN

## 2015-10-08 NOTE — ED Notes (Signed)
The pt is c/o bloack stools fior the past 4-6 hours no pain

## 2015-10-08 NOTE — ED Notes (Signed)
Dr Jenkins in room with pt.  

## 2015-10-08 NOTE — ED Provider Notes (Signed)
CSN: [id number]     Arrival date & time 10/08/15  0115 History  By signing my name below, I, Doreatha Martin, attest that this documentation has been prepared under the direction and in the presence of Geoffery Lyons, MD. Electronically Signed: Doreatha Martin, ED Scribe. 10/08/2015. 1:45 AM.    Chief Complaint  Patient presents with  . Rectal Bleeding   Patient is a 81 y.o. female presenting with hematochezia. The history is provided by the patient and the spouse. No language interpreter was used.  Rectal Bleeding Quality:  Bright red and black and tarry Amount:  Moderate Timing:  Intermittent Chronicity:  Recurrent Context: hemorrhoids and spontaneously   Context: not rectal pain   Similar prior episodes: yes   Relieved by:  None tried Worsened by:  Defecation Ineffective treatments:  None tried Associated symptoms: no abdominal pain     HPI Comments: Jennifer [name] is a 81 y.o. female with h/o DM, HTN, AAA, GERD, hemorrhoids who presents to the Emergency Department complaining of moderate, intermittent bloody stools, 3 episodes tonight, with dark red blood onset 4 hours ago. Pt states that she also notes some possible clots in the stool. H/o similar symptoms with prior diverticulitis/losis 6 months ago. She states this bleeding is not similar to her bleeding with hemorrhoids. Husband reports that he checked the hemorrhoids 4 days ago and they were not inflamed at that time. Pt did not receive a colonoscopy or endoscopy. Pt has had a colonoscopy 5 years ago. PCP is Dr. Bosie Clos. She denies CP, SOB, abdominal pain.   Past Medical History  Diagnosis Date  . Diabetes mellitus without complication (HCC)   . Hypertension   . AAA (abdominal aortic aneurysm)-repaired   . Irregular heart beat   . Acute bronchitis   . Tachycardia     Unspecified  . Shortness of breath dyspnea   . GERD (gastroesophageal reflux disease)   . Anemia   . CHF (congestive heart failure) [facility])    Past Surgical History   Procedure Laterality Date  . Abdominal hysterectomy      40 years ago, intestinal damage  . Abdominal aortic aneurysm repair  2012    Dr. Arbie Cookey  . Breast lumpectomy Left     Benign Mass  . Thoracic aortic endovascular stent graft N/A 09/27/2014    Procedure: THORACIC AORTIC ENDOVASCULAR STENT GRAFT;  Surgeon: Nada Libman, MD;  Location: [facility] OR;  Service: Vascular;  Laterality: N/A;  . Oophorectomy      40 years ago  . Incision and drainage abscess N/A 02/28/2015    Procedure: INCISION AND DRAINAGE ABSCESS ABDOMINAL WALL;  Surgeon: Claud Kelp, MD;  Location: [facility] OR;  Service: General;  Laterality: N/A;   Family History  Problem Relation Age of Onset  . Heart disease Sister   . Cancer Brother    Social History  Substance Use Topics  . Smoking status: Never Smoker   . Smokeless tobacco: Never Used     Comment: quit smoking in 1975  . Alcohol Use: No   OB History    No data available     Review of Systems  Respiratory: Negative for shortness of breath.   Cardiovascular: Negative for chest pain.  Gastrointestinal: Positive for blood in stool and hematochezia. Negative for abdominal pain.  All other systems reviewed and are negative.  Allergies  Sulfur and Tape  Home Medications   Prior to Admission medications   Medication Sig Start Date End Date Taking? Authorizing Provider  amLODipine (NORVASC) 10 MG tablet Take 10 mg by mouth daily.    Historical Provider, MD  atorvastatin (LIPITOR) 20 MG tablet Take 1 tablet (20 mg total) by mouth daily at 6 PM. Patient taking differently: Take 20 mg by mouth daily.  09/12/14   Rhonda G Barrett, PA-C  calcium carbonate (OS-CAL) 600 MG TABS tablet Take 600 mg by mouth 2 (two) times daily with a meal. Takes in the morning and at lunch time    Historical Provider, MD  [employer]S STOOL SOFTENER 100 MG capsule Take 100 mg by mouth daily. 05/31/15   Historical Provider, MD  ferrous sulfate 325 (65 FE) MG tablet Take 1 tablet (325 mg total) by  mouth 2 (two) times daily with a meal. 03/04/15   Catarina Hartshornavid Tat, MD  furosemide (LASIX) 20 MG tablet Take 1 tablet (20 mg total) by mouth daily. Patient taking differently: Take 20 mg by mouth 2 (two) times daily.  04/04/15   Penny Piarlando Vega, MD  gemfibrozil (LOPID) 600 MG tablet Take 600 mg by mouth 2 (two) times daily. 01/12/15   Historical Provider, MD  glipiZIDE (GLIPIZIDE XL) 2.5 MG 24 hr tablet Take 1 tablet (2.5 mg total) by mouth daily with breakfast. 04/04/15   Penny Piarlando Vega, MD  KLOR-CON M20 20 MEQ tablet Take 20 mEq by mouth daily. 06/22/15   Historical Provider, MD  labetalol (NORMODYNE) 100 MG tablet Take 1 tablet (100 mg total) by mouth 2 (two) times daily. 04/04/15   Penny Piarlando Vega, MD  Nutritional Supplements (JUICE PLUS FIBRE PO) Take 3 tablets by mouth daily.     Historical Provider, MD  omeprazole (PRILOSEC) 40 MG capsule Take 40 mg by mouth daily. 03/17/15   Historical Provider, MD  Polyethyl Glycol-Propyl Glycol (SYSTANE OP) Place 1 drop into both eyes 2 (two) times daily.     Historical Provider, MD  Vitamin D, Ergocalciferol, (DRISDOL) 50000 UNITS CAPS capsule Take 50,000 Units by mouth every Saturday.     Historical Provider, MD   BP 153/57 mmHg  Pulse 68  Temp(Src) 97.9 F (36.6 C) (Oral)  Resp 20  SpO2 98% Physical Exam  Constitutional: She is oriented to person, place, and time. She appears well-developed and well-nourished. No distress.  HENT:  Head: Normocephalic and atraumatic.  Eyes: EOM are normal.  Neck: Normal range of motion.  Cardiovascular: Normal rate, regular rhythm and normal heart sounds.   Pulmonary/Chest: Effort normal and breath sounds normal.  Abdominal: Soft. She exhibits no distension. There is no tenderness.  Genitourinary:  There are several hemorrhoids noted. No masses palpable by DRE. There is some melenotic stool present. Chaperone present.   Musculoskeletal: Normal range of motion.  Neurological: She is alert and oriented to person, place, and time.   Skin: Skin is warm and dry.  Psychiatric: She has a normal mood and affect. Judgment normal.  Nursing note and vitals reviewed.   ED Course  Procedures (including critical care time) DIAGNOSTIC STUDIES: Oxygen Saturation is 98% on RA, normal by my interpretation.    COORDINATION OF CARE: 1:43 AM Discussed treatment plan with pt at bedside and pt agreed to plan.   Labs Review Labs Reviewed  CBC - Abnormal; Notable for the following:    RBC 3.46 (*)    Hemoglobin 9.4 (*)    HCT 30.6 (*)    RDW 20.0 (*)    All other components within normal limits  LIPASE, BLOOD  COMPREHENSIVE METABOLIC PANEL  URINALYSIS, ROUTINE W REFLEX MICROSCOPIC ([facility])  POC OCCULT BLOOD, ED  SAMPLE TO BLOOD BANK    Imaging Review No results found. I have personally reviewed and evaluated these images and lab results as part of my medical decision-making.   EKG Interpretation None      MDM   Final diagnoses:  None   Melena with anemia, but appears hemodynamically stable.  Will admit to Hospital for GI workup.  Dr. Lovell Sheehan to admit.  I personally performed the services described in this documentation, which was scribed in my presence. The recorded information has been reviewed and is accurate.      Geoffery Lyons, MD 10/08/15 [phone]

## 2015-10-08 NOTE — Consult Note (Signed)
[facility] Gastroenterology Consultation Note  Referring Provider: Dr. Osvaldo Shipper [facility]) Primary Care Physician:  Lonzo Cloud, Landry Mellow, MD Primary Gastroenterologist:  Dr. Vida Rigger   Reason for Consultation:  GI bleed  HPI: Jennifer [name] is a 81 y.o. female admitted for GI bleed.  Yesterday, patient had three episodes of blood in stool.  Blood is maroon, somewhat black and tarry, somewhat bright red.  No hematemesis.  Is on oral iron, but stool appearance dramatically changed yesterday. No abdominal pain.  Regular bowel movements.  Denies weight loss.  No blood thinners.  Had endovascular repair of descending aortic aneurysm about one year ago.  Had admission for GI bleeding in May as well, presumed diverticular, no endoscopic evaluation was done.  No further bleeding since admission overnight.   Past Medical History  Diagnosis Date  . Diabetes mellitus without complication (HCC)   . Hypertension   . AAA (abdominal aortic aneurysm)-repaired   . Irregular heart beat   . Acute bronchitis   . Tachycardia     Unspecified  . Shortness of breath dyspnea   . GERD (gastroesophageal reflux disease)   . Anemia   . CHF (congestive heart failure) [facility])     Past Surgical History  Procedure Laterality Date  . Abdominal hysterectomy      40 years ago, intestinal damage  . Abdominal aortic aneurysm repair  2012    Dr. Arbie Cookey  . Breast lumpectomy Left     Benign Mass  . Thoracic aortic endovascular stent graft N/A 09/27/2014    Procedure: THORACIC AORTIC ENDOVASCULAR STENT GRAFT;  Surgeon: Nada Libman, MD;  Location: [facility] OR;  Service: Vascular;  Laterality: N/A;  . Oophorectomy      40 years ago  . Incision and drainage abscess N/A 02/28/2015    Procedure: INCISION AND DRAINAGE ABSCESS ABDOMINAL WALL;  Surgeon: Claud Kelp, MD;  Location: [facility] OR;  Service: General;  Laterality: N/A;    Prior to Admission medications   Medication Sig Start Date End Date Taking? Authorizing Provider   amLODipine (NORVASC) 10 MG tablet Take 10 mg by mouth daily.   Yes Historical Provider, MD  atorvastatin (LIPITOR) 20 MG tablet Take 1 tablet (20 mg total) by mouth daily at 6 PM. Patient taking differently: Take 20 mg by mouth daily.  09/12/14  Yes Rhonda G Barrett, PA-C  calcium carbonate (OS-CAL) 600 MG TABS tablet Take 600 mg by mouth 2 (two) times daily with a meal. Takes in the morning and at lunch time   Yes Historical Provider, MD  [employer] STOOL SOFTENER 100 MG capsule Take 100 mg by mouth daily. 05/31/15  Yes Historical Provider, MD  ferrous sulfate 325 (65 FE) MG tablet Take 1 tablet (325 mg total) by mouth 2 (two) times daily with a meal. 03/04/15  Yes Catarina Hartshorn, MD  furosemide (LASIX) 20 MG tablet Take 1 tablet (20 mg total) by mouth daily. Patient taking differently: Take 20 mg by mouth 2 (two) times daily.  04/04/15  Yes Penny Pia, MD  gemfibrozil (LOPID) 600 MG tablet Take 600 mg by mouth 2 (two) times daily. 01/12/15  Yes Historical Provider, MD  glipiZIDE (GLIPIZIDE XL) 2.5 MG 24 hr tablet Take 1 tablet (2.5 mg total) by mouth daily with breakfast. 04/04/15  Yes Penny Pia, MD  KLOR-CON M20 20 MEQ tablet Take 20 mEq by mouth daily. 06/22/15  Yes Historical Provider, MD  labetalol (NORMODYNE) 100 MG tablet Take 1 tablet (100 mg total) by mouth 2 (two)  times daily. 04/04/15  Yes Orlando Vega, MD  Nutritional Supplements (JUICE PLUS FIBRE PO) Take 3 tablets by mouth daily.    Yes Historical Provider, MD  omeprazole (PRILOSEC) 40 MG capsule Take 40 mg by mouth daily. 03/17/15  Yes Historical Provider, MD  Polyethyl Glycol-Propyl Glycol (SYSTANE OP) Place 1 drop into both eyes 2 (two) times daily.    Yes Historical Provider, MD  Vitamin D, Ergocalciferol, (DRISDOL) 50000 UNITS CAPS capsule Take 50,000 Units by mouth every Saturday.    Yes Historical Provider, MD    Current Facility-Administered Medications  Medication Dose Route Frequency Provider Last Rate Last Dose  . 0.9 %  sodium chloride  infusion   Intravenous Continuous Harvette C Jenkins, MD 50 mL/hr at 10/08/15 0445    . acetaminophen (TYLENOL) tablet 650 mg  650 mg Oral Q6H PRN Harvette C Jenkins, MD       Or  . acetaminophen (TYLENOL) suppository 650 mg  650 mg Rectal Q6H PRN Harvette C Jenkins, MD      . alum & mag hydroxide-simeth (MAALOX/MYLANTA) 200-200-20 MG/5ML suspension 30 mL  30 mL Oral Q6H PRN Harvette C Jenkins, MD      . amLODipine (NORVASC) tablet 10 mg  10 mg Oral Daily Harvette C Jenkins, MD   10 mg at 10/08/15 0944  . cefTRIAXone (ROCEPHIN) 1 g in dextrose 5 % 50 mL IVPB  1 g Intravenous Q24H Gokul Krishnan, MD   1 g at 10/08/15 1046  . HYDROmorphone (DILAUDID) injection 0.5-1 mg  0.5-1 mg Intravenous Q3H PRN Harvette C Jenkins, MD      . insulin aspart (novoLOG) injection 0-9 Units  0-9 Units Subcutaneous 6 times per day Harvette C Jenkins, MD   0 Units at 10/08/15 0408  . labetalol (NORMODYNE) tablet 100 mg  100 mg Oral BID Harvette C Jenkins, MD   100 mg at 10/08/15 0944  . ondansetron (ZOFRAN) tablet 4 mg  4 mg Oral Q6H PRN Harvette C Jenkins, MD       Or  . ondansetron (ZOFRAN) injection 4 mg  4 mg Intravenous Q6H PRN Harvette C Jenkins, MD      . oxyCODONE (Oxy IR/ROXICODONE) immediate release tablet 5 mg  5 mg Oral Q4H PRN Harvette C Jenkins, MD      . pantoprazole (PROTONIX) injection 40 mg  40 mg Intravenous Q12H Harvette C Jenkins, MD   40 mg at 10/08/15 0945  . sodium chloride 0.9 % injection 3 mL  3 mL Intravenous Q12H Harvette C Jenkins, MD   3 mL at 10/08/15 1000    Allergies as of 10/08/2015 - Review Complete 10/08/2015  Allergen Reaction Noted  . Sulfur  04/24/2015  . Tape Rash and Other (See Comments) 04/01/2015    Family History  Problem Relation Age of Onset  . Heart disease Sister   . Cancer Brother     Social History   Social History  . Marital Status: Married    Spouse Name: N/A  . Number of Children: N/A  . Years of Education: N/A   Occupational History  . Not on  file.   Social History Main Topics  . Smoking status: Never Smoker   . Smokeless tobacco: Never Used     Comment: quit smoking in 1975  . Alcohol Use: No  . Drug Use: No  . Sexual Activity: Not on file   Other Topics Concern  . Not on file   Social History Narrative    Review of   Systems: ROS Dr. Lovell SheehanJenkins 10/08/15 reviewed and I agree  Physical Exam: Vital signs in last 24 hours: Temp:  [97.9 F (36.6 C)-98 F (36.7 C)] 98 F (36.7 C) (11/06 0450) Pulse Rate:  [63-68] 67 (11/06 0450) Resp:  [16-20] 16 (11/06 0450) BP: (150-175)/(54-84) 150/63 mmHg (11/06 0450) SpO2:  [95 %-98 %] 95 % (11/06 0450) Weight:  [61.825 kg (136 lb 4.8 oz)] 61.825 kg (136 lb 4.8 oz) (11/06 0450) Last BM Date: 10/07/15 General:   Alert,  Elderly but Well-developed, well-nourished, pleasant and cooperative in NAD Head:  Normocephalic and atraumatic. Eyes:  Sclera clear, no icterus.   Conjunctiva slight pale Ears:  Normal auditory acuity. Nose:  No deformity, discharge,  or lesions. Mouth:  No deformity or lesions.  Oropharynx modestly dry, slightly pale Neck:  Supple; no masses or thyromegaly. Lungs:  Clear throughout to auscultation.   No wheezes, crackles, or rhonchi. No acute distress. Heart:  Regular rate and rhythm; no murmurs, clicks, rubs,  or gallops. Abdomen:  Soft, protuberant, nontender, mild distended, no peritonitis No masses, hepatosplenomegaly or hernias noted. Normal bowel sounds, without guarding, and without rebound.     Msk:  Diffuse extremity atrophy, Symmetrical without gross deformities. Normal posture. Pulses:  Normal pulses noted. Extremities:  Without clubbing or edema. Neurologic:  Alert and  oriented x4;  grossly normal neurologically. Skin:  Thin and fragile, otherwise intact without significant lesions or rashes. Psych:  Alert and cooperative. Normal mood and affect.   Lab Results:  Recent Labs  10/08/15 0126 10/08/15 0403 10/08/15 0536  WBC 5.9  --  6.4  HGB  9.4* 8.7* 8.5*  HCT 30.6* 27.6* 28.2*  PLT 199  --  167   BMET  Recent Labs  10/08/15 0126 10/08/15 0536  NA 141 141  K 4.3 4.1  CL 108 108  CO2 23 24  GLUCOSE 119* 100*  BUN 27* 24*  CREATININE 1.41* 1.37*  CALCIUM 9.7 9.4   LFT  Recent Labs  10/08/15 0126  PROT 6.7  ALBUMIN 3.4*  AST 14*  ALT 9*  ALKPHOS 73  BILITOT 0.4   PT/INR No results for input(s): LABPROT, INR in the last 72 hours.  Studies/Results: No results found.  Impression:  1.  Gastrointestinal bleeding.  No bleeding overnight.  Hemodynamically stable.  Black/maroon consistency, upper versus lower GI source.  Bleeding certainly could be diverticular, as was suspected during admission May 2016, but stool color (black/maroon) is not readily consistent with this entity. Herald bleed from aortoenteric fistula is another possibility. 2.  Acute on chronic anemia.  Enhanced from recent GI bleeding. 3.  Aortic aneurysm repair in October 2015.  Plan:  1.  CT angiogram chest/abdomen/pelvis to assess for possible aortoenteric fistula (herald bleed).  Patient has chronic renal insufficiency, I discussed with Dr. Llana AlimentEntrikin, and he advised it was ok to do scan with reduced contrast protocol.   2.  If CT scan is unrevealing, and so long as she remains hemodynamically stable, would likely pursue endoscopy tomorrow for further evaluation. 3.  Will follow; thank you for the consultation.    LOS: 0 days   Amedio Bowlby M  10/08/2015, 11:19 AM  Pager [phone] If no answer or after 5 PM call [phone]

## 2015-10-08 NOTE — Progress Notes (Signed)
NURSING PROGRESS NOTE  Jennifer [name] [id number] Admission Data: 10/08/2015 4:52 AM Attending Provider: Ron ParkerHarvette C Jenkins, MD MWN:[id number]:Jennifer [name], Jennifer MellowIbethal JARALLA, MD Code Status: Full  Jennifer [name] is a [age] y.o. female patient admitted from ED:  -No acute distress noted.  -No complaints of shortness of breath.  -No complaints of chest pain.   Cardiac Monitoring: Box # 25 in place. Cardiac monitor yields:normal sinus rhythm.  Blood pressure 150/63, pulse 67, temperature 98 F (36.7 C), temperature source Oral, resp. rate 16, height 5' 3.6" (1.615 m), weight 61.825 kg (136 lb 4.8 oz), SpO2 95 %.   IV Fluids:  IV in place, occlusive dsg intact without redness, IV cath forearm left, condition patent and no redness normal saline.   Allergies:  Sulfur and Tape  Past Medical History:   has a past medical history of Diabetes mellitus without complication (HCC); Hypertension; AAA (abdominal aortic aneurysm)-repaired; Irregular heart beat; Acute bronchitis; Tachycardia; Shortness of breath dyspnea; GERD (gastroesophageal reflux disease); Anemia; and CHF (congestive heart failure) (HCC).  Past Surgical History:   has past surgical history that includes Abdominal hysterectomy; Abdominal aortic aneurysm repair (2012); Breast lumpectomy (Left); Thoracic aortic endovascular stent graft (N/A, 09/27/2014); Oophorectomy; and Incision and drainage abscess (N/A, 02/28/2015).  Social History:   reports that she has never smoked. She has never used smokeless tobacco. She reports that she does not drink alcohol or use illicit drugs.  Skin: intact  Patient/Family orientated to room. Information packet given to patient/family. Admission inpatient armband information verified with patient/family to include name and date of birth and placed on patient arm. Side rails up x 2, fall assessment and education completed with patient/family. Patient/family able to verbalize understanding of risk associated with falls and  verbalized understanding to call for assistance before getting out of bed. Call light within reach. Patient/family able to voice and demonstrate understanding of unit orientation instructions.

## 2015-10-08 NOTE — ED Notes (Signed)
CBG 96. 

## 2015-10-08 NOTE — Progress Notes (Signed)
[facility] PROGRESS NOTE  Jennifer [name] ZOX:[id number] DOB: Nov 20, 1925 DOA: 10/08/2015  PCP: Tommy Rainwater, MD  Brief HPI: 81 year old Caucasian female with a past medical history of hypertension, diabetes, diastolic CHF, thoracic aortic aneurysm, status post thoracic aorta stent graft. Patient presented with complains of colored stools. They're also reports that she may have had bloody stools as well. Patient was hospitalized for further management.  Past medical history:  Past Medical History  Diagnosis Date  . Diabetes mellitus without complication (HCC)   . Hypertension   . AAA (abdominal aortic aneurysm)-repaired   . Irregular heart beat   . Acute bronchitis   . Tachycardia     Unspecified  . Shortness of breath dyspnea   . GERD (gastroesophageal reflux disease)   . Anemia   . CHF (congestive heart failure) (HCC)     Consultants: [facility]  Procedures: None. So far  Antibiotics: Ceftriaxone 11/6  Subjective: Patient is very upset this morning that she has not had anything to eat or drink. Denies any abdominal pain. No nausea, vomiting. No further episodes of black stools over blood in stool. Since she has been in the hospital.  Objective: Vital Signs  Filed Vitals:   10/08/15 0119 10/08/15 0345 10/08/15 0400 10/08/15 0450  BP: 153/57 175/84 170/54 150/63  Pulse: 68 63 63 67  Temp: 97.9 F (36.6 C)   98 F (36.7 C)  TempSrc: Oral   Oral  Resp: Height:    5' 3.6" (1.615 m)  Weight:    61.825 kg (136 lb 4.8 oz)  SpO2: 98% 95% 95% 95%   No intake or output data in the 24 hours ending 10/08/15 0935 Filed Weights   10/08/15 0450  Weight: 61.825 kg (136 lb 4.8 oz)    General appearance: alert, cooperative, appears stated age and no distress Resp: clear to auscultation bilaterally Cardio: S1, S2 is normal, regular. Systolic murmur appreciated over the precordium. No S3, S4. No rubs, murmurs, or bruit. No pedal  edema. GI: soft, non-tender; bowel sounds normal; no masses,  no organomegaly Extremities: extremities normal, atraumatic, no cyanosis or edema Neurologic: Alert and oriented X 3, normal strength and tone. No focal deficits.  Lab Results:  Basic Metabolic Panel:  Recent Labs Lab 10/08/15 0126 10/08/15 0536  NA 141 141  K 4.3 4.1  CL 108 108  CO2 23 24  GLUCOSE 119* 100*  BUN 27* 24*  CREATININE 1.41* 1.37*  CALCIUM 9.7 9.4   Liver Function Tests:  Recent Labs Lab 10/08/15 0126  AST 14*  ALT 9*  ALKPHOS 73  BILITOT 0.4  PROT 6.7  ALBUMIN 3.4*    Recent Labs Lab 10/08/15 0126  LIPASE 45   CBC:  Recent Labs Lab 10/08/15 0126 10/08/15 0403 10/08/15 0536  WBC 5.9  --  6.4  HGB 9.4* 8.7* 8.5*  HCT 30.6* 27.6* 28.2*  MCV 88.4  --  88.4  PLT 199  --  167   CBG:  Recent Labs Lab 10/08/15 0408 10/08/15 0448 10/08/15 0734  GLUCAP 96 91 110*    No results found for this or any previous visit (from the past 240 hour(s)).    Studies/Results: No results found.  Medications:  Scheduled: . amLODipine  10 mg Oral Daily  . cefTRIAXone (ROCEPHIN)  IV  1 g Intravenous Q24H  . insulin aspart  0-9 Units Subcutaneous 6 times per day  . labetalol  100 mg Oral BID  .  pantoprazole (PROTONIX) IV  40 mg Intravenous Q12H  . sodium chloride  3 mL Intravenous Q12H   Continuous: . sodium chloride 50 mL/hr at 10/08/15 0445   NWG:[id number]:acetaminophen **OR** acetaminophen, alum & mag hydroxide-simeth, HYDROmorphone (DILAUDID) injection, ondansetron **OR** ondansetron (ZOFRAN) IV, oxyCODONE  Assessment/Plan:  Principal Problem:   GI bleed Active Problems:   CKD (chronic kidney disease), stage III   Diabetes (HCC)   HTN (hypertension)   Anemia   Diastolic CHF (HCC)   Gastrointestinal hemorrhage with melena    GI bleed Remains unclear if she had melanotic stools or she had hematochezia. Patient was hospitalized for hematochezia back in May of this year. It was  thought to be diverticular in origin. Patient was seen by gastroenterology. Hemoglobin stabilized. She did not undergo endoscopy. She was discharged with plans to follow-up closely with her gastroenterologist. Hemoglobin is noted to be low. Not actively bleeding currently. Gastroenterology has been consulted. Hemodynamically stable. Continue PPI.  UTI Send urine culture. Start ceftriaxone.  History of descending thoracic aneurysm Status post graft placement last year. Stable  History of AAA, status post graft placement in the past Stable appearance of this graft noted in June. GI is concerned about possible aortoenteric fistula. They want to pursue this with a repeat CT scan which is reasonable.  Anemia secondary to acute blood loss Monitor hemoglobin closely. Transfuse as needed. Currently no indication for transfusion.  History of chronic diastolic congestive heart failure Currently well compensated. Continue to monitor closely.  History of diabetes mellitus type 2 Continue sliding scale coverage. Monitor CBGs. HbA1c was 5.5 in May.  History of essential hypertension Continuing home medications. Monitor blood pressures.  History of chronic kidney disease stage III Stable. At baseline.  DVT Prophylaxis: SCDs    Code Status: Full code  Family Communication: Discussed with the patient  Disposition Plan: Await GI input. Monitor hemoglobins.     LOS: 0 days   [facility] D[facility] Eye CentersKRISHNAN,Sayda Grable  [facility] Pager [phone] 10/08/2015, 9:35 AM  If 7PM-7AM, please contact night-coverage at [email or web address], password [email or web address]

## 2015-10-08 NOTE — H&P (Signed)
[facility] Admission History and Physical       Jennifer [name]:[id number] DOB: 02/16/1925 DOA: 10/08/2015  Referring physician: EDP PCP: Jennifer Rainwater, Jennifer [name]  Specialists:   Chief Complaint: Black Stools  HPI: Jennifer [name] is a 81 y.o. female with a history of HTN, DM2, CHF, AAA who presents to the ED with complaints of 3 episodes of black stools onset 6 hours PTA.   She denies any hematemesis, weakness, dizziness or SOB.  She also denies any ABD pain or Chest pain.   She was evaluated in the ED and found to have an initial Hemoglobin of 9.4.      Review of Systems:  Constitutional: No Weight Loss, No Weight Gain, Night Sweats, Fevers, Chills, Dizziness, Light Headedness, Fatigue, or Generalized Weakness HEENT: No Headaches, Difficulty Swallowing,Tooth/Dental Problems,Sore Throat,  No Sneezing, Rhinitis, Ear Ache, Nasal Congestion, or Post Nasal Drip,  Cardio-vascular:  No Chest pain, Orthopnea, PND, Edema in Lower Extremities, Anasarca, Dizziness, Palpitations  Resp: No Dyspnea, No DOE, No Productive Cough, No Non-Productive Cough, No Hemoptysis, No Wheezing.    GI: No Heartburn, Indigestion, Abdominal Pain, Nausea, Vomiting, Diarrhea, Constipation, Hematemesis, Hematochezia, +Melena, Change in Bowel Habits,  Loss of Appetite  GU: No Dysuria, No Change in Color of Urine, No Urgency or Urinary Frequency, No Flank pain.  Musculoskeletal: No Joint Pain or Swelling, No Decreased Range of Motion, No Back Pain.  Neurologic: No Syncope, No Seizures, Muscle Weakness, Paresthesia, Vision Disturbance or Loss, No Diplopia, No Vertigo, No Difficulty Walking,  Skin: No Rash or Lesions. Psych: No Change in Mood or Affect, No Depression or Anxiety, No Memory loss, No Confusion, or Hallucinations   Past Medical History  Diagnosis Date  . Diabetes mellitus without complication (HCC)   . Hypertension   . AAA (abdominal aortic aneurysm)-repaired   . Irregular heart beat   .  Acute bronchitis   . Tachycardia     Unspecified  . Shortness of breath dyspnea   . GERD (gastroesophageal reflux disease)   . Anemia   . CHF (congestive heart failure) [facility])      Past Surgical History  Procedure Laterality Date  . Abdominal hysterectomy      40 years ago, intestinal damage  . Abdominal aortic aneurysm repair  2012    Jennifer [name]  . Breast lumpectomy Left     Benign Mass  . Thoracic aortic endovascular stent graft N/A 09/27/2014    Procedure: THORACIC AORTIC ENDOVASCULAR STENT GRAFT;  Surgeon: Jennifer Libman, Jennifer [name];  Location: [facility] OR;  Service: Vascular;  Laterality: N/A;  . Oophorectomy      40 years ago  . Incision and drainage abscess N/A 02/28/2015    Procedure: INCISION AND DRAINAGE ABSCESS ABDOMINAL WALL;  Surgeon: Jennifer Kelp, Jennifer [name];  Location: [facility] OR;  Service: General;  Laterality: N/A;      Prior to Admission medications   Medication Sig Start Date End Date Taking? Authorizing Provider  amLODipine (NORVASC) 10 MG tablet Take 10 mg by mouth daily.   Yes Historical Provider, Jennifer [name]  atorvastatin (LIPITOR) 20 MG tablet Take 1 tablet (20 mg total) by mouth daily at 6 PM. Patient taking differently: Take 20 mg by mouth daily.  09/12/14  Yes Jennifer G Barrett, Jennifer [name]  calcium carbonate (OS-CAL) 600 MG TABS tablet Take 600 mg by mouth 2 (two) times daily with a meal. Takes in the morning and at lunch time   Yes Historical Provider, Jennifer [name]  [employer] STOOL SOFTENER 100 MG  capsule Take 100 mg by mouth daily. 05/31/15  Yes Historical Provider, Jennifer [name]  ferrous sulfate 325 (65 FE) MG tablet Take 1 tablet (325 mg total) by mouth 2 (two) times daily with a meal. 03/04/15  Yes Jennifer Hartshorn, Jennifer [name]  furosemide (LASIX) 20 MG tablet Take 1 tablet (20 mg total) by mouth daily. Patient taking differently: Take 20 mg by mouth 2 (two) times daily.  04/04/15  Yes Jennifer Pia, Jennifer [name]  gemfibrozil (LOPID) 600 MG tablet Take 600 mg by mouth 2 (two) times daily. 01/12/15  Yes Historical Provider, Jennifer [name]  glipiZIDE (GLIPIZIDE  XL) 2.5 MG 24 hr tablet Take 1 tablet (2.5 mg total) by mouth daily with breakfast. 04/04/15  Yes Jennifer Pia, Jennifer [name]  KLOR-CON M20 20 MEQ tablet Take 20 mEq by mouth daily. 06/22/15  Yes Historical Provider, Jennifer [name]  labetalol (NORMODYNE) 100 MG tablet Take 1 tablet (100 mg total) by mouth 2 (two) times daily. 04/04/15  Yes Jennifer Pia, Jennifer [name]  Nutritional Supplements (JUICE PLUS FIBRE PO) Take 3 tablets by mouth daily.    Yes Historical Provider, Jennifer [name]  omeprazole (PRILOSEC) 40 MG capsule Take 40 mg by mouth daily. 03/17/15  Yes Historical Provider, Jennifer [name]  Polyethyl Glycol-Propyl Glycol (SYSTANE OP) Place 1 drop into both eyes 2 (two) times daily.    Yes Historical Provider, Jennifer [name]  Vitamin D, Ergocalciferol, (DRISDOL) 50000 UNITS CAPS capsule Take 50,000 Units by mouth every Saturday.    Yes Historical Provider, Jennifer [name]     Allergies  Allergen Reactions  . Sulfur     Unknown reaction   . Tape Rash and Other (See Comments)    "paper" tape please, adhesive tape "burns'    Social History:  reports that she has never smoked. She has never used smokeless tobacco. She reports that she does not drink alcohol or use illicit drugs.    Family History  Problem Relation Age of Onset  . Heart disease Sister   . Cancer Brother        Physical Exam:  GEN:  Pleasant Elderly Obese  81 y.o. Caucasian female examined and in no acute distress; cooperative with exam Filed Vitals:   10/08/15 0119  BP: 153/57  Pulse: 68  Temp: 97.9 F (36.6 C)  TempSrc: Oral  Resp: 20  SpO2: 98%   Blood pressure 153/57, pulse 68, temperature 97.9 F (36.6 C), temperature source Oral, resp. rate 20, SpO2 98 %. PSYCH: She is alert and oriented x4; does not appear anxious does not appear depressed; affect is normal HEENT: Normocephalic and Atraumatic, Mucous membranes pink; PERRLA; EOM intact; Fundi:  Benign;  No scleral icterus, Nares: Patent, Oropharynx: Clear, Fair Dentition,    Neck:  FROM, No Cervical Lymphadenopathy nor Thyromegaly or  Carotid Bruit; No JVD; Breasts:: Not examined CHEST WALL: No tenderness CHEST: Normal respiration, clear to auscultation bilaterally HEART: Regular rate and rhythm; no murmurs rubs or gallops BACK: No kyphosis or scoliosis; No CVA tenderness ABDOMEN: Positive Bowel Sounds, Obese, Soft Non-Tender, No Rebound or Guarding; No Masses, No Organomegaly. Rectal Exam: Not done EXTREMITIES: No Cyanosis, Clubbing, or Edema; No Ulcerations. Genitalia: not examined PULSES: 2+ and symmetric SKIN: Normal hydration no rash or ulceration CNS:  Alert and Oriented x 4, No Focal Deficits Vascular: pulses palpable throughout    Labs on Admission:  Basic Metabolic Panel:  Recent Labs Lab 10/08/15 0126  NA 141  K 4.3  CL 108  CO2 23  GLUCOSE 119*  BUN 27*  CREATININE 1.41*  CALCIUM 9.7  Liver Function Tests:  Recent Labs Lab 10/08/15 0126  AST 14*  ALT 9*  ALKPHOS 73  BILITOT 0.4  PROT 6.7  ALBUMIN 3.4*    Recent Labs Lab 10/08/15 0126  LIPASE 45   No results for input(s): AMMONIA in the last 168 hours. CBC:  Recent Labs Lab 10/08/15 0126  WBC 5.9  HGB 9.4*  HCT 30.6*  MCV 88.4  PLT 199   Cardiac Enzymes: No results for input(s): CKTOTAL, CKMB, CKMBINDEX, TROPONINI in the last 168 hours.  BNP (last 3 results)  Recent Labs  03/31/15 1045 06/22/15 1744  BNP 915.6* 379.1*    ProBNP (last 3 results) No results for input(s): PROBNP in the last 8760 hours.  CBG: No results for input(s): GLUCAP in the last 168 hours.  Radiological Exams on Admission: No results found.   Assessment/Plan:   81 y.o. female with  Principal Problem:   1.    GI bleed   Monitor H/Hs   IV Protonix    Type and Screen sent   GI Consult in AM       Active Problems:   2.    Anemia   Monitor H/Hs   Type and Screen sent           3.    Diastolic CHF (HCC)   Continue Lasix Rx   Monitor I/Os      4.    Diabetes (HCC)   Hold Glipizide Rx   SSI Coverage PRN       5.     HTN (hypertension)   Continue Amlodipine and Labetalol Rx as BP tolerates   Monitor BPs     6.    CKD (chronic kidney disease), stage III   Monitor BUN/Cr     7.    DVT Prophylaxis   SCDs      Code Status:     FULL CODE     Family Communication:   Husband at Bedside     Disposition Plan:    Inpatient Status        Time spent:  5070 Minutes      Ron ParkerJENKINS,Leopoldo Mazzie C [facility] Pager [phone]   If 7AM -7PM Please Contact the Day Rounding Team Jennifer [name] for [facility]  If 7PM-7AM, Please Contact Night-Floor Coverage  [email or web address] Password [email or web address] 10/08/2015, 3:33 AM     ADDENDUM:   Patient was seen and examined on 10/08/2015

## 2015-10-09 ENCOUNTER — Encounter (HOSPITAL_COMMUNITY): Payer: Self-pay | Admitting: *Deleted

## 2015-10-09 ENCOUNTER — Inpatient Hospital Stay (HOSPITAL_COMMUNITY): Payer: Medicare Other | Admitting: Anesthesiology

## 2015-10-09 ENCOUNTER — Encounter (HOSPITAL_COMMUNITY)
Admission: EM | Disposition: A | Discharge status: Home / Self Care | Payer: Self-pay | Source: Home / Self Care | Attending: Internal Medicine

## 2015-10-09 DIAGNOSIS — D62 Acute posthemorrhagic anemia: Secondary | ICD-10-CM

## 2015-10-09 DIAGNOSIS — N183 Chronic kidney disease, stage 3 (moderate): Secondary | ICD-10-CM

## 2015-10-09 DIAGNOSIS — E119 Type 2 diabetes mellitus without complications: Secondary | ICD-10-CM

## 2015-10-09 HISTORY — PX: ESOPHAGOGASTRODUODENOSCOPY (EGD) WITH PROPOFOL: SHX5813

## 2015-10-09 LAB — HEMOGLOBIN A1C
HEMOGLOBIN A1C: 5.8 % — AB (ref 4.8–5.6)
Mean Plasma Glucose: 120 mg/dL

## 2015-10-09 LAB — HEMOGLOBIN AND HEMATOCRIT, BLOOD
HCT: 29.1 % — ABNORMAL LOW (ref 36.0–46.0)
HCT: 30.3 % — ABNORMAL LOW (ref 36.0–46.0)
HEMATOCRIT: 29.4 % — AB (ref 36.0–46.0)
HEMOGLOBIN: 9 g/dL — AB (ref 12.0–15.0)
Hemoglobin: 9 g/dL — ABNORMAL LOW (ref 12.0–15.0)
Hemoglobin: 9.3 g/dL — ABNORMAL LOW (ref 12.0–15.0)

## 2015-10-09 LAB — OCCULT BLOOD, POC DEVICE: Fecal Occult Bld: POSITIVE — AB

## 2015-10-09 LAB — GLUCOSE, CAPILLARY
GLUCOSE-CAPILLARY: 136 mg/dL — AB (ref 65–99)
GLUCOSE-CAPILLARY: 79 mg/dL (ref 65–99)
Glucose-Capillary: 121 mg/dL — ABNORMAL HIGH (ref 65–99)
Glucose-Capillary: 77 mg/dL (ref 65–99)
Glucose-Capillary: 93 mg/dL (ref 65–99)
Glucose-Capillary: 99 mg/dL (ref 65–99)

## 2015-10-09 LAB — BASIC METABOLIC PANEL
ANION GAP: 9 (ref 5–15)
BUN: 13 mg/dL (ref 6–20)
CHLORIDE: 108 mmol/L (ref 101–111)
CO2: 23 mmol/L (ref 22–32)
Calcium: 9.3 mg/dL (ref 8.9–10.3)
Creatinine, Ser: 1.05 mg/dL — ABNORMAL HIGH (ref 0.44–1.00)
GFR calc Af Amer: 53 mL/min — ABNORMAL LOW (ref >60)
GFR calc non Af Amer: 46 mL/min — ABNORMAL LOW (ref >60)
GLUCOSE: 98 mg/dL (ref 65–99)
POTASSIUM: 4.1 mmol/L (ref 3.5–5.1)
Sodium: 140 mmol/L (ref 135–145)

## 2015-10-09 SURGERY — ESOPHAGOGASTRODUODENOSCOPY (EGD) WITH PROPOFOL
Anesthesia: Monitor Anesthesia Care | Laterality: Left

## 2015-10-09 MED ORDER — SODIUM CHLORIDE 0.9 % IV SOLN
INTRAVENOUS | Status: DC
Start: 2015-10-09 — End: 2015-10-09

## 2015-10-09 MED ORDER — SODIUM CHLORIDE 0.9 % IJ SOLN
INTRAMUSCULAR | Status: AC
  Filled 2015-10-09: qty 10

## 2015-10-09 MED ORDER — BUTAMBEN-TETRACAINE-BENZOCAINE 2-2-14 % EX AERO
INHALATION_SPRAY | CUTANEOUS | Status: DC | PRN
  Administered 2015-10-09: 2 via TOPICAL

## 2015-10-09 MED ORDER — LACTATED RINGERS IV SOLN
INTRAVENOUS | Status: DC
  Administered 2015-10-09: 08:00:00 via INTRAVENOUS

## 2015-10-09 MED ORDER — LIDOCAINE HCL (CARDIAC) 20 MG/ML IV SOLN
INTRAVENOUS | Status: DC | PRN
  Administered 2015-10-09: 50 mg via INTRAVENOUS

## 2015-10-09 MED ORDER — LOPERAMIDE HCL 2 MG PO CAPS
4.0000 mg | ORAL_CAPSULE | Freq: Once | ORAL | Status: AC
  Administered 2015-10-09: 4 mg via ORAL
  Filled 2015-10-09: qty 2

## 2015-10-09 MED ORDER — PROPOFOL 500 MG/50ML IV EMUL
INTRAVENOUS | Status: DC | PRN
  Administered 2015-10-09: 60 ug/kg/min via INTRAVENOUS

## 2015-10-09 NOTE — Transfer of Care (Signed)
Immediate Anesthesia Transfer of Care Note  Patient: Jennifer [name]  Procedure(s) Performed: Procedure(s): ESOPHAGOGASTRODUODENOSCOPY (EGD) WITH PROPOFOL (Left)  Patient Location: PACU  Anesthesia Type:MAC  Level of Consciousness: awake  Airway & Oxygen Therapy: Patient Spontanous Breathing and Patient connected to nasal cannula oxygen  Post-op Assessment: Report given to RN and Post -op Vital signs reviewed and stable  Post vital signs: Reviewed and stable  Last Vitals:  Filed Vitals:   10/09/15 0843  BP: 188/73  Pulse: 75  Temp:   Resp: 14    Complications: No apparent anesthesia complications

## 2015-10-09 NOTE — Op Note (Signed)
[facility] [address] [place], [id number]   ENDOSCOPY PROCEDURE REPORT  PATIENT: [name], [name]  MR#: [id number] BIRTHDATE: 06-02-1925 , 89  yrs. old GENDER: female ENDOSCOPIST: Willis ModenaWilliam Outlaw, MD REFERRED BY:  [facility] PROCEDURE DATE:  10/09/2015 PROCEDURE:  EGD, diagnostic ASA CLASS:     Class III INDICATIONS:  anemia, blood in stool. MEDICATIONS: Monitored anesthesia care TOPICAL ANESTHETIC: Cetacaine Spray  DESCRIPTION OF PROCEDURE: After the risks benefits and alternatives of the procedure were thoroughly explained, informed consent was obtained.  The Pentax Gastroscope [id number] endoscope was introduced through the mouth and advanced to the second portion of the duodenum. The instrument was slowly withdrawn as the mucosa was fully examined. Estimated blood loss is zero unless otherwise noted in this procedure report.    Findings:  Small hiatal hernia, otherwise normal esophagus.  Mild diffuse atrophic-appearing gastritis, otherwise normal stomach. Normal pylorus.  Normal duodenum to the second portion. Retroflexion into cardia showed small hiatal hernia, otherwise normal.  No old or fresh blood seen to the extent of our examination.              The scope was then withdrawn from the patient and the procedure completed.  COMPLICATIONS: There were no immediate complications.  ENDOSCOPIC IMPRESSION:     As above.  No source of blood in stool noted.  Suspect this might have been another diverticular bleed, as was likewise suspected in May 2016.  RECOMMENDATIONS:     1.  Watch for potential complications of procedure. 2.  Advance diet as tolerated. 3.  Follow CBCs. 4.  [facility] will follow; if no further bleeding, might consider discharge in the next day or two; if bleeding recurs, might consider tagged RBC for further evaluation.  Patient is not a great candidate for enteroscopy, given her age and comorbidities, thus doubt the  utility of capsule endoscopy.  eSigned:  Willis ModenaWilliam Outlaw, MD 10/09/2015 8:41 AM   CC:  CPT CODES: ICD CODES:  The ICD and CPT codes recommended by this software are interpretations from the data that the clinical staff has captured with the software.  The verification of the translation of this report to the ICD and CPT codes and modifiers is the sole responsibility of the health care institution and practicing physician where this report was generated.  [employer] will not be held responsible for the validity of the ICD and CPT codes included on this report.  AMA assumes no liability for data contained or not contained herein. CPT is a Publishing rights managerregistered trademark of the [employer].

## 2015-10-09 NOTE — Anesthesia Preprocedure Evaluation (Addendum)
Anesthesia Evaluation  Patient identified by MRN, date of birth, ID band Patient awake    Reviewed: Allergy & Precautions, NPO status , Patient's Chart, lab work & pertinent test results  History of Anesthesia Complications Negative for: history of anesthetic complications  Airway Mallampati: III  TM Distance: >3 FB Neck ROM: Full    Dental  (+) Teeth Intact, Dental Advisory Given,    Pulmonary neg shortness of breath, former smoker,  Quit smoking [id number]   Pulmonary exam normal breath sounds clear to auscultation       Cardiovascular hypertension, Pt. on medications and Pt. on home beta blockers + Peripheral Vascular Disease and +CHF  Normal cardiovascular exam Rhythm:Regular Rate:Normal     Neuro/Psych negative neurological ROS  negative psych ROS   GI/Hepatic GERD  ,  Endo/Other  diabetes, Oral Hypoglycemic Agents  Renal/GU Renal disease     Musculoskeletal   Abdominal   Peds  Hematology  (+) anemia ,   Anesthesia Other Findings   Reproductive/Obstetrics                            Anesthesia Physical Anesthesia Plan  ASA: III  Anesthesia Plan: MAC   Post-op Pain Management:    Induction: Intravenous  Airway Management Planned: Nasal Cannula and Natural Airway  Additional Equipment: None  Intra-op Plan:   Post-operative Plan:   Informed Consent: I have reviewed the patients History and Physical, chart, labs and discussed the procedure including the risks, benefits and alternatives for the proposed anesthesia with the patient or authorized representative who has indicated his/her understanding and acceptance.   Dental advisory given  Plan Discussed with: CRNA, Anesthesiologist and Surgeon  Anesthesia Plan Comments:        Anesthesia Quick Evaluation

## 2015-10-09 NOTE — H&P (View-Only) (Signed)
[facility] Gastroenterology Consultation Note  Referring Provider: Dr. Osvaldo Shipper [facility]) Primary Care Physician:  Lonzo Cloud, Landry Mellow, MD Primary Gastroenterologist:  Dr. Vida Rigger   Reason for Consultation:  GI bleed  HPI: Jennifer [name] is a 81 y.o. female admitted for GI bleed.  Yesterday, patient had three episodes of blood in stool.  Blood is maroon, somewhat black and tarry, somewhat bright red.  No hematemesis.  Is on oral iron, but stool appearance dramatically changed yesterday. No abdominal pain.  Regular bowel movements.  Denies weight loss.  No blood thinners.  Had endovascular repair of descending aortic aneurysm about one year ago.  Had admission for GI bleeding in May as well, presumed diverticular, no endoscopic evaluation was done.  No further bleeding since admission overnight.   Past Medical History  Diagnosis Date  . Diabetes mellitus without complication (HCC)   . Hypertension   . AAA (abdominal aortic aneurysm)-repaired   . Irregular heart beat   . Acute bronchitis   . Tachycardia     Unspecified  . Shortness of breath dyspnea   . GERD (gastroesophageal reflux disease)   . Anemia   . CHF (congestive heart failure) [facility])     Past Surgical History  Procedure Laterality Date  . Abdominal hysterectomy      40 years ago, intestinal damage  . Abdominal aortic aneurysm repair  2012    Dr. Arbie Cookey  . Breast lumpectomy Left     Benign Mass  . Thoracic aortic endovascular stent graft N/A 09/27/2014    Procedure: THORACIC AORTIC ENDOVASCULAR STENT GRAFT;  Surgeon: Nada Libman, MD;  [facility] OR;  Service: Vascular;  Laterality: N/A;  . Oophorectomy      40 years ago  . Incision and drainage abscess N/A 02/28/2015    Procedure: INCISION AND DRAINAGE ABSCESS ABDOMINAL WALL;  Surgeon: Claud Kelp, MD;  Location: [facility] OR;  Service: General;  Laterality: N/A;    Prior to Admission medications   Medication Sig Start Date End Date Taking? Authorizing Provider   amLODipine (NORVASC) 10 MG tablet Take 10 mg by mouth daily.   Yes Historical Provider, MD  atorvastatin (LIPITOR) 20 MG tablet Take 1 tablet (20 mg total) by mouth daily at 6 PM. Patient taking differently: Take 20 mg by mouth daily.  09/12/14  Yes Rhonda G Barrett, PA-C  calcium carbonate (OS-CAL) 600 MG TABS tablet Take 600 mg by mouth 2 (two) times daily with a meal. Takes in the morning and at lunch time   Yes Historical Provider, MD  [employer]S STOOL SOFTENER 100 MG capsule Take 100 mg by mouth daily. 05/31/15  Yes Historical Provider, MD  ferrous sulfate 325 (65 FE) MG tablet Take 1 tablet (325 mg total) by mouth 2 (two) times daily with a meal. 03/04/15  Yes Catarina Hartshorn, MD  furosemide (LASIX) 20 MG tablet Take 1 tablet (20 mg total) by mouth daily. Patient taking differently: Take 20 mg by mouth 2 (two) times daily.  04/04/15  Yes Penny Pia, MD  gemfibrozil (LOPID) 600 MG tablet Take 600 mg by mouth 2 (two) times daily. 01/12/15  Yes Historical Provider, MD  glipiZIDE (GLIPIZIDE XL) 2.5 MG 24 hr tablet Take 1 tablet (2.5 mg total) by mouth daily with breakfast. 04/04/15  Yes Penny Pia, MD  KLOR-CON M20 20 MEQ tablet Take 20 mEq by mouth daily. 06/22/15  Yes Historical Provider, MD  labetalol (NORMODYNE) 100 MG tablet Take 1 tablet (100 mg total) by mouth 2 (two)  times daily. 04/04/15  Yes Penny Pia, MD  Nutritional Supplements (JUICE PLUS FIBRE PO) Take 3 tablets by mouth daily.    Yes Historical Provider, MD  omeprazole (PRILOSEC) 40 MG capsule Take 40 mg by mouth daily. 03/17/15  Yes Historical Provider, MD  Polyethyl Glycol-Propyl Glycol (SYSTANE OP) Place 1 drop into both eyes 2 (two) times daily.    Yes Historical Provider, MD  Vitamin D, Ergocalciferol, (DRISDOL) 50000 UNITS CAPS capsule Take 50,000 Units by mouth every Saturday.    Yes Historical Provider, MD    Current Facility-Administered Medications  Medication Dose Route Frequency Provider Last Rate Last Dose  . 0.9 %  sodium chloride  infusion   Intravenous Continuous Ron Parker, MD 50 mL/hr at 10/08/15 0445    . acetaminophen (TYLENOL) tablet 650 mg  650 mg Oral Q6H PRN Ron Parker, MD       Or  . acetaminophen (TYLENOL) suppository 650 mg  650 mg Rectal Q6H PRN Ron Parker, MD      . alum & mag hydroxide-simeth (MAALOX/MYLANTA) 200-200-20 MG/5ML suspension 30 mL  30 mL Oral Q6H PRN Ron Parker, MD      . amLODipine (NORVASC) tablet 10 mg  10 mg Oral Daily Ron Parker, MD   10 mg at 10/08/15 0944  . cefTRIAXone (ROCEPHIN) 1 g in dextrose 5 % 50 mL IVPB  1 g Intravenous Q24H Osvaldo Shipper, MD   1 g at 10/08/15 1046  . HYDROmorphone (DILAUDID) injection 0.5-1 mg  0.5-1 mg Intravenous Q3H PRN Ron Parker, MD      . insulin aspart (novoLOG) injection 0-9 Units  0-9 Units Subcutaneous 6 times per day Ron Parker, MD   0 Units at 10/08/15 0408  . labetalol (NORMODYNE) tablet 100 mg  100 mg Oral BID Ron Parker, MD   100 mg at 10/08/15 0944  . ondansetron (ZOFRAN) tablet 4 mg  4 mg Oral Q6H PRN Ron Parker, MD       Or  . ondansetron (ZOFRAN) injection 4 mg  4 mg Intravenous Q6H PRN Ron Parker, MD      . oxyCODONE (Oxy IR/ROXICODONE) immediate release tablet 5 mg  5 mg Oral Q4H PRN Ron Parker, MD      . pantoprazole (PROTONIX) injection 40 mg  40 mg Intravenous Q12H Ron Parker, MD   40 mg at 10/08/15 0945  . sodium chloride 0.9 % injection 3 mL  3 mL Intravenous Q12H Ron Parker, MD   3 mL at 10/08/15 1000    Allergies as of 10/08/2015 - Review Complete 10/08/2015  Allergen Reaction Noted  . Sulfur  04/24/2015  . Tape Rash and Other (See Comments) 04/01/2015    Family History  Problem Relation Age of Onset  . Heart disease Sister   . Cancer Brother     Social History   Social History  . Marital Status: Married    Spouse Name: N/A  . Number of Children: N/A  . Years of Education: N/A   Occupational History  . Not on  file.   Social History Main Topics  . Smoking status: Never Smoker   . Smokeless tobacco: Never Used     Comment: quit smoking in 1975  . Alcohol Use: No  . Drug Use: No  . Sexual Activity: Not on file   Other Topics Concern  . Not on file   Social History Narrative    Review of  Systems: ROS Dr. Lovell SheehanJenkins 10/08/15 reviewed and I agree  Physical Exam: Vital signs in last 24 hours: Temp:  [97.9 F (36.6 C)-98 F (36.7 C)] 98 F (36.7 C) (11/06 0450) Pulse Rate:  [63-68] 67 (11/06 0450) Resp:  [16-20] 16 (11/06 0450) BP: (150-175)/(54-84) 150/63 mmHg (11/06 0450) SpO2:  [95 %-98 %] 95 % (11/06 0450) Weight:  [61.825 kg (136 lb 4.8 oz)] 61.825 kg (136 lb 4.8 oz) (11/06 0450) Last BM Date: 10/07/15 General:   Alert,  Elderly but Well-developed, well-nourished, pleasant and cooperative in NAD Head:  Normocephalic and atraumatic. Eyes:  Sclera clear, no icterus.   Conjunctiva slight pale Ears:  Normal auditory acuity. Nose:  No deformity, discharge,  or lesions. Mouth:  No deformity or lesions.  Oropharynx modestly dry, slightly pale Neck:  Supple; no masses or thyromegaly. Lungs:  Clear throughout to auscultation.   No wheezes, crackles, or rhonchi. No acute distress. Heart:  Regular rate and rhythm; no murmurs, clicks, rubs,  or gallops. Abdomen:  Soft, protuberant, nontender, mild distended, no peritonitis No masses, hepatosplenomegaly or hernias noted. Normal bowel sounds, without guarding, and without rebound.     Msk:  Diffuse extremity atrophy, Symmetrical without gross deformities. Normal posture. Pulses:  Normal pulses noted. Extremities:  Without clubbing or edema. Neurologic:  Alert and  oriented x4;  grossly normal neurologically. Skin:  Thin and fragile, otherwise intact without significant lesions or rashes. Psych:  Alert and cooperative. Normal mood and affect.   Lab Results:  Recent Labs  10/08/15 0126 10/08/15 0403 10/08/15 0536  WBC 5.9  --  6.4  HGB  9.4* 8.7* 8.5*  HCT 30.6* 27.6* 28.2*  PLT 199  --  167   BMET  Recent Labs  10/08/15 0126 10/08/15 0536  NA 141 141  K 4.3 4.1  CL 108 108  CO2 23 24  GLUCOSE 119* 100*  BUN 27* 24*  CREATININE 1.41* 1.37*  CALCIUM 9.7 9.4   LFT  Recent Labs  10/08/15 0126  PROT 6.7  ALBUMIN 3.4*  AST 14*  ALT 9*  ALKPHOS 73  BILITOT 0.4   PT/INR No results for input(s): LABPROT, INR in the last 72 hours.  Studies/Results: No results found.  Impression:  1.  Gastrointestinal bleeding.  No bleeding overnight.  Hemodynamically stable.  Black/maroon consistency, upper versus lower GI source.  Bleeding certainly could be diverticular, as was suspected during admission May 2016, but stool color (black/maroon) is not readily consistent with this entity. Herald bleed from aortoenteric fistula is another possibility. 2.  Acute on chronic anemia.  Enhanced from recent GI bleeding. 3.  Aortic aneurysm repair in October 2015.  Plan:  1.  CT angiogram chest/abdomen/pelvis to assess for possible aortoenteric fistula (herald bleed).  Patient has chronic renal insufficiency, I discussed with Dr. Llana AlimentEntrikin, and he advised it was ok to do scan with reduced contrast protocol.   2.  If CT scan is unrevealing, and so long as she remains hemodynamically stable, would likely pursue endoscopy tomorrow for further evaluation. 3.  Will follow; thank you for the consultation.    LOS: 0 days   Shalla Bulluck M  10/08/2015, 11:19 AM  Pager [phone] If no answer or after 5 PM call [phone]

## 2015-10-09 NOTE — Progress Notes (Signed)
[facility] PROGRESS NOTE  RICKIA FREEBURG FTD:[id number] DOB: 06/22/1925 DOA: 10/08/2015  PCP: Tommy Rainwater, MD  Brief HPI: 81 year old Caucasian female with a past medical history of hypertension, diabetes, diastolic CHF, thoracic aortic aneurysm, status post thoracic aorta stent graft. Patient presented with complains of colored stools. They're also reports that she may have had bloody stools as well. Patient was hospitalized for further management. Patient was seen by gastroenterology. She underwent EGD.  Past medical history:  Past Medical History  Diagnosis Date  . Diabetes mellitus without complication (HCC)   . Hypertension   . AAA (abdominal aortic aneurysm)-repaired   . Irregular heart beat   . Acute bronchitis   . Tachycardia     Unspecified  . Shortness of breath dyspnea   . GERD (gastroesophageal reflux disease)   . Anemia   . CHF (congestive heart failure) [facility])     Consultants: [facility]  Procedures:  EGD 11/7 Findings: Small hiatal hernia, otherwise normal esophagus. Mild diffuse atrophic-appearing gastritis, otherwise normal stomach. Normal pylorus. Normal duodenum to the second portion. Retroflexion into cardia showed small hiatal hernia, otherwise normal. No old or fresh blood seen to the extent of our examination. The scope was then withdrawn from the patient and the procedure completed. ENDOSCOPIC IMPRESSION: As above. No source of blood in stool noted. Suspect this might have been another diverticular bleed, as was likewise suspected in May 2016.  Antibiotics: Ceftriaxone 11/6  Subjective: Patient denies any complaints this morning. No episodes of bleeding overnight. No nausea, vomiting. Tolerated the procedure quite well.   Objective: Vital Signs  Filed Vitals:   10/09/15 0855 10/09/15 0900 10/09/15 0905 10/09/15 0948  BP:   192/65 172/54  Pulse: 68 68 71 70  Temp:    98.9 F (37.2 C)  TempSrc:    Oral  Resp:  Height:      Weight:      SpO2: 95% 95% 95% 97%    Intake/Output Summary (Last 24 hours) at 10/09/15 1008 Last data filed at 10/09/15 0948  Gross per 24 hour  Intake 2162.5 ml  Output   1575 ml  Net  587.5 ml   Filed Weights   10/08/15 0450  Weight: 61.825 kg (136 lb 4.8 oz)    General appearance: alert, cooperative, appears stated age and no distress Resp: clear to auscultation bilaterally Cardio: S1, S2 is normal, regular. Systolic murmur appreciated over the precordium. No S3, S4. No rubs, murmurs, or bruit. No pedal edema. GI: soft, non-tender; bowel sounds normal; no masses,  no organomegaly Neurologic: Alert and oriented X 3, normal strength and tone. No focal deficits.  Lab Results:  Basic Metabolic Panel:  Recent Labs Lab 10/08/15 0126 10/08/15 0536 10/09/15 0349  NA 141 141 140  K 4.3 4.1 4.1  CL 108 108 108  CO2 GLUCOSE 119* 100* 98  BUN 27* 24* 13  CREATININE 1.41* 1.37* 1.05*  CALCIUM 9.7 9.4 9.3   Liver Function Tests:  Recent Labs Lab 10/08/15 0126  AST 14*  ALT 9*  ALKPHOS 73  BILITOT 0.4  PROT 6.7  ALBUMIN 3.4*    Recent Labs Lab 10/08/15 0126  LIPASE 45   CBC:  Recent Labs Lab 10/08/15 0126 10/08/15 0403 10/08/15 0536 10/08/15 1152 10/08/15 1959 10/09/15 0349  WBC 5.9  --  6.4  --   --   --   HGB 9.4* 8.7* 8.5* 9.3* 8.6* 9.0*  HCT  30.6* 27.6* 28.2* 30.4* 28.1* 29.1*  MCV 88.4  --  88.4  --   --   --   PLT 199  --  167  --   --   --    CBG:  Recent Labs Lab 10/08/15 1633 10/08/15 2033 10/09/15 0014 10/09/15 0414 10/09/15 0745  GLUCAP 70 84 99 93 77    Recent Results (from the past 240 hour(s))  Culture, Urine     Status: None (Preliminary result)   Collection Time: 10/08/15 10:44 AM  Result Value Ref Range Status   Specimen Description URINE, CLEAN CATCH  Final   Special Requests NONE  Final   Culture >=100,000 COLONIES/mL GRAM NEGATIVE RODS  Final   Report Status PENDING  Incomplete        Studies/Results: Ct Angio Chest Aorta W/cm &/or Wo/cm  10/08/2015  CLINICAL DATA:  GI bleed. Inpatient. Three red blood per stool. History of abdominal aortic aneurysm repair in 2012 and thoracic aortic aneurysm endograft repair on 09/27/2014. EXAM: CT ANGIOGRAPHY CHEST, ABDOMEN AND PELVIS TECHNIQUE: Multidetector CT imaging through the chest, abdomen and pelvis was performed using the standard protocol during bolus administration of intravenous contrast. Multiplanar reconstructed images and MIPs were obtained and reviewed to evaluate the vascular anatomy. CONTRAST:  OMNIPAQUE IOHEXOL 350 MG/ML SOLN COMPARISON:  05/08/2015 chest, abdomen and pelvis CT angiogram. FINDINGS: CTA CHEST FINDINGS Mediastinum/Nodes: Stable mild cardiomegaly. No pericardial fluid/thickening. There is atherosclerosis of the thoracic aorta, the great vessels of the mediastinum and the coronary arteries, including calcified atherosclerotic plaque in the left main, left anterior descending, left circumflex and right coronary arteries. Stable position and appearance of the thoracic aortic stent graft extending from the distal aortic arch beyond the left subclavian artery takeoff to the level distal descending thoracic aorta, with no kink or discontinuity. No acute intramural thoracic aortic hemorrhage. The descending thoracic aortic aneurysm sac measures 5.5 cm in maximum transverse diameter, compared to 5.3 cm on the 05/08/2015 CT using similar measurement technique, slightly increased. Re- demonstrated is a type 2 endoleak in the thoracic aorta, with flow originating in the superior aneurysm sac, with slightly increased endoleak flow in the interval. Normal caliber pulmonary arteries. No central pulmonary emboli. Stable multinodular goiter with dominant 2.2 cm hypodense inferior right thyroid lobe nodule. Normal esophagus. No pathologically enlarged axillary, mediastinal or hilar lymph nodes. Aortic arch branch vessels remain  patent. The previously described left subclavian artery dissection is significantly less prominent in the interval, with tiny residual dissection flap at the origin of the left subclavian artery. Lungs/Pleura: No pneumothorax. No pleural effusion. Stable 10 mm right middle lobe solid pulmonary nodule (series 506/image 20). Stable 3 mm apical left upper lobe pulmonary nodule (506/11). No acute consolidative airspace disease, new significant pulmonary nodules or lung masses. Musculoskeletal:  No aggressive appearing focal osseous lesions. Review of the MIP images confirms the above findings. CTA ABDOMEN AND PELVIS FINDINGS Hepatobiliary: Simple 1.3 cm right liver dome cyst. Otherwise normal liver. Normal gallbladder with no radiopaque cholelithiasis. No biliary ductal dilatation. Pancreas: Normal, with no mass or duct dilation. Spleen: Normal size. No mass. Adrenals/Urinary Tract: Right adrenal 1.4 cm mass is stable since 2011, in keeping with a benign adenoma. Normal left adrenal. Stable subcentimeter hyperdense precontrast renal foci in the upper kidneys bilaterally, too small to characterize, suggestive of benign hemorrhagic renal cysts. Simple 1.7 cm lateral lower left renal cyst, decreased. Simple 1.6 cm medial lower left renal cyst. Simple 1.8 cm anterior interpolar left renal  cyst. Additional subcentimeter hypodense renal lesions, too small to characterize. Nonobstructing 6 mm stone in the lower left kidney. No hydronephrosis. Normal bladder. Stomach/Bowel: Grossly normal stomach. Normal caliber small bowel with no small bowel wall thickening. Appendix is not discretely visualized. Diffuse colonic diverticulosis, which is most prominent in the sigmoid colon. No large bowel wall thickening or pericolonic fat stranding. No active contrast extravasation is seen into the bowel lumen. Worsened diastasis in the midline ventral pelvic wall containing small bowel loops, with no small bowel wall thickening, pneumatosis  or focal small bowel caliber transition. Vascular/Lymphatic: Stable postsurgical changes status post surgical repair of infrarenal abdominal aortic aneurysm, with no acute abdominal aortic wall thickening. 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 Patent SMA. Stable 0.7 cm distal left main renal artery coarsely calcified aneurysm. No residual hypoperfusion in the upper right kidney. Patent portal, splenic and renal veins. No pathologically enlarged lymph nodes in the abdomen or pelvis. Reproductive: Status post hysterectomy, with no abnormal findings at the vaginal cuff. No right adnexal mass. Stable simple 2.3 cm left adnexal cyst, benign-appearing. Other: No pneumoperitoneum, ascites or focal fluid collection. Musculoskeletal: No aggressive appearing focal osseous lesions. Marked degenerative changes in the lumbar spine. Review of the MIP images confirms the above findings. IMPRESSION: 1. Status post descending thoracic aortic aneurysm stent graft repair with worsening type 2 endoleak and slight growth of the descending thoracic aortic aneurysm sac, now 5.5 cm. Continued chest CT angiography follow-up is advised in 3-6 months if no intervention is performed. 2. Stable pulmonary nodules, largest 10 mm in the right middle lobe. 3. Resolving left subclavian artery dissection, with patent aortic arch branches. 4. Diffuse colonic diverticulosis. No evidence of bowel obstruction or acute bowel inflammation. No active contrast extravasation into the bowel lumen is detected. 5. Stable abdominal aortic aneurysm repair, with no abdominal aortic complication. 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 Electronically Signed   By: Delbert Phenix M.D.   On: 10/08/2015 15:36   Ct Angio Abd/pel W/ And/or W/o  10/08/2015  CLINICAL DATA:  GI bleed. Inpatient. Three red  blood per stool. History of abdominal aortic aneurysm repair in 2012 and thoracic aortic aneurysm endograft repair on 09/27/2014. EXAM: CT ANGIOGRAPHY CHEST, ABDOMEN AND PELVIS TECHNIQUE: Multidetector CT imaging through the chest, abdomen and pelvis was performed using the standard protocol during bolus administration of intravenous contrast. Multiplanar reconstructed images and MIPs were obtained and reviewed to evaluate the vascular anatomy. CONTRAST:  OMNIPAQUE IOHEXOL 350 MG/ML SOLN COMPARISON:  05/08/2015 chest, abdomen and pelvis CT angiogram. FINDINGS: CTA CHEST FINDINGS Mediastinum/Nodes: Stable mild cardiomegaly. No pericardial fluid/thickening. There is atherosclerosis of the thoracic aorta, the great vessels of the mediastinum and the coronary arteries, including calcified atherosclerotic plaque in the left main, left anterior descending, left circumflex and right coronary arteries. Stable position and appearance of the thoracic aortic stent graft extending from the distal aortic arch beyond the left subclavian artery takeoff to the level distal descending thoracic aorta, with no kink or discontinuity. No acute intramural thoracic aortic hemorrhage. The descending thoracic aortic aneurysm sac measures 5.5 cm in maximum transverse diameter, compared to 5.3 cm on the 05/08/2015 CT using similar measurement technique, slightly increased. Re- demonstrated is a type 2 endoleak in the thoracic aorta, with flow originating in the superior aneurysm sac, with slightly increased endoleak flow in the interval. Normal  caliber pulmonary arteries. No central pulmonary emboli. Stable multinodular goiter with dominant 2.2 cm hypodense inferior right thyroid lobe nodule. Normal esophagus. No pathologically enlarged axillary, mediastinal or hilar lymph nodes. Aortic arch branch vessels remain patent. The previously described left subclavian artery dissection is significantly less prominent in the interval, with tiny  residual dissection flap at the origin of the left subclavian artery. Lungs/Pleura: No pneumothorax. No pleural effusion. Stable 10 mm right middle lobe solid pulmonary nodule (series 506/image 20). Stable 3 mm apical left upper lobe pulmonary nodule (506/11). No acute consolidative airspace disease, new significant pulmonary nodules or lung masses. Musculoskeletal:  No aggressive appearing focal osseous lesions. Review of the MIP images confirms the above findings. CTA ABDOMEN AND PELVIS FINDINGS Hepatobiliary: Simple 1.3 cm right liver dome cyst. Otherwise normal liver. Normal gallbladder with no radiopaque cholelithiasis. No biliary ductal dilatation. Pancreas: Normal, with no mass or duct dilation. Spleen: Normal size. No mass. Adrenals/Urinary Tract: Right adrenal 1.4 cm mass is stable since 2011, in keeping with a benign adenoma. Normal left adrenal. Stable subcentimeter hyperdense precontrast renal foci in the upper kidneys bilaterally, too small to characterize, suggestive of benign hemorrhagic renal cysts. Simple 1.7 cm lateral lower left renal cyst, decreased. Simple 1.6 cm medial lower left renal cyst. Simple 1.8 cm anterior interpolar left renal cyst. Additional subcentimeter hypodense renal lesions, too small to characterize. Nonobstructing 6 mm stone in the lower left kidney. No hydronephrosis. Normal bladder. Stomach/Bowel: Grossly normal stomach. Normal caliber small bowel with no small bowel wall thickening. Appendix is not discretely visualized. Diffuse colonic diverticulosis, which is most prominent in the sigmoid colon. No large bowel wall thickening or pericolonic fat stranding. No active contrast extravasation is seen into the bowel lumen. Worsened diastasis in the midline ventral pelvic wall containing small bowel loops, with no small bowel wall thickening, pneumatosis or focal small bowel caliber transition. Vascular/Lymphatic: Stable postsurgical changes status post surgical repair of  infrarenal abdominal aortic aneurysm, with no acute abdominal aortic wall thickening. 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 Patent SMA. Stable 0.7 cm distal left main renal artery coarsely calcified aneurysm. No residual hypoperfusion in the upper right kidney. Patent portal, splenic and renal veins. No pathologically enlarged lymph nodes in the abdomen or pelvis. Reproductive: Status post hysterectomy, with no abnormal findings at the vaginal cuff. No right adnexal mass. Stable simple 2.3 cm left adnexal cyst, benign-appearing. Other: No pneumoperitoneum, ascites or focal fluid collection. Musculoskeletal: No aggressive appearing focal osseous lesions. Marked degenerative changes in the lumbar spine. Review of the MIP images confirms the above findings. IMPRESSION: 1. Status post descending thoracic aortic aneurysm stent graft repair with worsening type 2 endoleak and slight growth of the descending thoracic aortic aneurysm sac, now 5.5 cm. Continued chest CT angiography follow-up is advised in 3-6 months if no intervention is performed. 2. Stable pulmonary nodules, largest 10 mm in the right middle lobe. 3. Resolving left subclavian artery dissection, with patent aortic arch branches. 4. Diffuse colonic diverticulosis. No evidence of bowel obstruction or acute bowel inflammation. No active contrast extravasation into the bowel lumen is detected. 5. Stable abdominal aortic aneurysm repair, with no abdominal aortic complication. 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 Electronically Signed   By: Delbert Phenix M.D.   On: 10/08/2015 15:36    Medications:  Scheduled: . amLODipine  10 mg Oral Daily  .  cefTRIAXone (ROCEPHIN)  IV  1 g Intravenous Q24H  . insulin aspart  0-9 Units Subcutaneous 6 times per day  . labetalol  100 mg Oral BID  .  pantoprazole (PROTONIX) IV  40 mg Intravenous Q12H  . sodium chloride  3 mL Intravenous Q12H   Continuous: . sodium chloride 50 mL/hr at 10/08/15 0445   ZOX:[id number]:acetaminophen **OR** acetaminophen, alum & mag hydroxide-simeth, HYDROmorphone (DILAUDID) injection, ondansetron **OR** ondansetron (ZOFRAN) IV, oxyCODONE  Assessment/Plan:  Principal Problem:   GI bleed Active Problems:   CKD (chronic kidney disease), stage III   Diabetes (HCC)   HTN (hypertension)   Anemia   Diastolic CHF (HCC)   Gastrointestinal hemorrhage with melena    GI bleed Remains unclear if she had melanotic stools or she had hematochezia. Patient was hospitalized for hematochezia back in May of this year. It was thought to be diverticular in origin. Patient was seen by gastroenterology. Patient underwent CT scan of the abdomen and pelvis as well as chest due to her history of aortic aneurysm. No evidence for aortoenteric fistula. Patient underwent EGD this morning which did not reveal any source of bleeding. Bleeding is thought to be diverticular. No plans for colonoscopy at this time. If she doesn't have any further bleeding and if hemoglobin remains stable, she should be able to go home tomorrow.   UTI Urine culture shows gram-negative rods. Final identification is pending. Continue ceftriaxone.  History of descending thoracic aneurysm Status post graft placement last year. Stable. Type 2 Endoleak is noted. Aneurysm size has increased to 5.5 cm. Will discuss with Dr. Myra GianottiBrabham who is her vascular surgeon. Will likely need close follow-up. Discussed with Dr. Myra GianottiBrabham. He will arrange CT scan and follow-up in 6 months.  History of AAA, status post graft placement in the past Stable.   Anemia secondary to acute blood loss Monitor hemoglobin closely. Transfuse as needed. Albumin remained stable. Currently no indication for transfusion.  History of chronic diastolic congestive heart failure Currently well compensated.  Continue to monitor closely. Stop IV fluids.  History of diabetes mellitus type 2 Continue sliding scale coverage. Monitor CBGs. HbA1c was 5.5 in May.  History of essential hypertension Continuing home medications. Monitor blood pressures.  History of chronic kidney disease stage III Stable. At baseline.  DVT Prophylaxis: SCDs    Code Status: Full code  Family Communication: Discussed with the patient  Disposition Plan: Await GI input. Monitor hemoglobins. Possible discharge tomorrow.     LOS: 1 day   [facility] Nicholas HospitalKRISHNAN,Diany Formosa  [facility] Pager [phone] 10/09/2015, 10:08 AM  If 7PM-7AM, please contact night-coverage at [email or web address], password [email or web address]

## 2015-10-09 NOTE — Interval H&P Note (Signed)
History and Physical Interval Note:  10/09/2015 8:13 AM  Jennifer [name]  has presented today for surgery, with the diagnosis of Melena, anemia  The various methods of treatment have been discussed with the patient and family. After consideration of risks, benefits and other options for treatment, the patient has consented to  Procedure(s): ESOPHAGOGASTRODUODENOSCOPY (EGD) WITH PROPOFOL (Left) as a surgical intervention .  The patient's history has been reviewed, patient examined, no change in status, stable for surgery.  I have reviewed the patient's chart and labs.  Questions were answered to the patient's satisfaction.     Freddy JakschUTLAW,Cybill Uriegas M

## 2015-10-10 ENCOUNTER — Encounter (HOSPITAL_COMMUNITY): Payer: Self-pay | Admitting: Gastroenterology

## 2015-10-10 DIAGNOSIS — Z8719 Personal history of other diseases of the digestive system: Secondary | ICD-10-CM

## 2015-10-10 LAB — GLUCOSE, CAPILLARY
GLUCOSE-CAPILLARY: 100 mg/dL — AB (ref 65–99)
GLUCOSE-CAPILLARY: 125 mg/dL — AB (ref 65–99)
Glucose-Capillary: 111 mg/dL — ABNORMAL HIGH (ref 65–99)

## 2015-10-10 LAB — URINE CULTURE: Culture: >=100000

## 2015-10-10 LAB — CBC
HEMATOCRIT: 30.8 % — AB (ref 36.0–46.0)
HEMOGLOBIN: 9.2 g/dL — AB (ref 12.0–15.0)
MCH: 26.8 pg (ref 26.0–34.0)
MCHC: 29.9 g/dL — AB (ref 30.0–36.0)
MCV: 89.8 fL (ref 78.0–100.0)
Platelets: 203 10*3/uL (ref 150–400)
RBC: 3.43 MIL/uL — ABNORMAL LOW (ref 3.87–5.11)
RDW: 19.8 % — AB (ref 11.5–15.5)
WBC: 6.1 10*3/uL (ref 4.0–10.5)

## 2015-10-10 LAB — BASIC METABOLIC PANEL
ANION GAP: 10 (ref 5–15)
BUN: 13 mg/dL (ref 6–20)
CALCIUM: 9.6 mg/dL (ref 8.9–10.3)
CO2: 25 mmol/L (ref 22–32)
CREATININE: 1.05 mg/dL — AB (ref 0.44–1.00)
Chloride: 106 mmol/L (ref 101–111)
GFR calc non Af Amer: 46 mL/min — ABNORMAL LOW (ref >60)
GFR, EST AFRICAN AMERICAN: 53 mL/min — AB (ref >60)
Glucose, Bld: 104 mg/dL — ABNORMAL HIGH (ref 65–99)
Potassium: 4.2 mmol/L (ref 3.5–5.1)
SODIUM: 141 mmol/L (ref 135–145)

## 2015-10-10 MED ORDER — CEFPODOXIME PROXETIL 200 MG PO TABS: 200.0000 mg | ORAL_TABLET | Freq: Every day | ORAL | Status: DC

## 2015-10-10 MED ORDER — CEFPODOXIME PROXETIL 200 MG PO TABS
200.0000 mg | ORAL_TABLET | Freq: Every day | ORAL | Status: DC
  Administered 2015-10-10: 200 mg via ORAL
  Filled 2015-10-10: qty 1

## 2015-10-10 NOTE — Evaluation (Signed)
Physical Therapy Evaluation Patient Details Name: Jennifer MusselJanie B [name] MRN: [id number] DOB: 09/29/1925 Today's Date: 10/10/2015   History of Present Illness  Pt adm with GI bleed. PMH - HTN, chf, dm, ckd, aortic aneurysm repair, abdominal wall abcess.  Clinical Impression  Pt admitted with above diagnosis and presents to PT with functional limitations due to deficits listed below (See PT problem list). Pt needs skilled PT to maximize independence and safety to allow discharge to home with husband. Will follow in hospital but do not feel pt will need PT at dc.       Follow Up Recommendations No PT follow up;Supervision - Intermittent    Equipment Recommendations  None recommended by PT    Recommendations for Other Services       Precautions / Restrictions Precautions Precautions: Fall Restrictions Weight Bearing Restrictions: No      Mobility  Bed Mobility               General bed mobility comments: Pt up at bedside with nurse tech  Transfers Overall transfer level: Needs assistance Equipment used: Rolling walker (2 wheeled) Transfers: Sit to/from Stand Sit to Stand: Supervision            Ambulation/Gait Ambulation/Gait assistance: Supervision Ambulation Distance (Feet): 225 Feet Assistive device: Rolling walker (2 wheeled) Gait Pattern/deviations: Step-through pattern;Decreased stride length     General Gait Details: Verbal cue to keep feet in walker with turns  Stairs            Wheelchair Mobility    Modified Rankin (Stroke Patients Only)       Balance Overall balance assessment: Needs assistance         Standing balance support: No upper extremity supported;During functional activity Standing balance-Leahy Scale: Fair                               Pertinent Vitals/Pain Pain Assessment: Faces Faces Pain Scale: Hurts little more Pain Location: back Pain Intervention(s): Limited activity within patient's  tolerance;Repositioned    Home Living Family/patient expects to be discharged to:: Private residence Living Arrangements: Spouse/significant other Available Help at Discharge: Family;Available 24 hours/day Type of Home: House Home Access: Stairs to enter Entrance Stairs-Rails: None Entrance Stairs-Number of Steps: 1 Home Layout: One level Home Equipment: Environmental consultantWalker - 2 wheels      Prior Function Level of Independence: Independent with assistive device(s)               Hand Dominance   Dominant Hand: Right    Extremity/Trunk Assessment   Upper Extremity Assessment: Overall WFL for tasks assessed           Lower Extremity Assessment: Overall WFL for tasks assessed         Communication   Communication: HOH  Cognition Arousal/Alertness: Awake/alert Behavior During Therapy: WFL for tasks assessed/performed Overall Cognitive Status: Within Functional Limits for tasks assessed                      General Comments      Exercises        Assessment/Plan    PT Assessment Patient needs continued PT services  PT Diagnosis Difficulty walking   PT Problem List Decreased balance;Decreased mobility  PT Treatment Interventions DME instruction;Gait training;Functional mobility training;Therapeutic activities;Therapeutic exercise;Balance training;Patient/family education   PT Goals (Current goals can be found in the Care Plan section) Acute Rehab PT Goals Patient Stated  Goal: return home PT Goal Formulation: With patient Time For Goal Achievement: 10/17/15 Potential to Achieve Goals: Good    Frequency Min 3X/week   Barriers to discharge        Co-evaluation               End of Session Equipment Utilized During Treatment: Gait belt Activity Tolerance: Patient tolerated treatment well Patient left: Other (comment) (standing at sink with nurse tech) Nurse Communication: Mobility status         Time: 1610-9604 PT Time Calculation (min)  (ACUTE ONLY): 10 min   Charges:   PT Evaluation $Initial PT Evaluation Tier I: 1 Procedure     PT G Codes:        Jennifer [name] 10-31-2015, 9:42 AM Skip Mayer PT [phone]

## 2015-10-10 NOTE — Discharge Summary (Signed)
[facility]  Physician Discharge Summary   Patient ID: Jennifer [name] MRN: [id number] DOB/AGE: 1925/05/08 81 y.o.  Admit date: 10/08/2015 Discharge date: 10/10/2015  PCP: Shamleffer, Landry Mellow, MD  DISCHARGE DIAGNOSES:  Principal Problem:   GI bleed Active Problems:   CKD (chronic kidney disease), stage III   Diabetes (HCC)   HTN (hypertension)   Anemia   Diastolic CHF (HCC)   Gastrointestinal hemorrhage with melena   RECOMMENDATIONS FOR OUTPATIENT FOLLOW UP: Outpatient follow-up. Will benefit from a repeat CBC at follow-up.  DISCHARGE CONDITION: fair  Diet recommendation: Modified carbohydrate  Filed Weights   10/08/15 0450  Weight: 61.825 kg (136 lb 4.8 oz)    INITIAL HISTORY: 81 year old Caucasian female with a past medical history of hypertension, diabetes, diastolic CHF, thoracic aortic aneurysm, status post thoracic aorta stent graft. Patient presented with complains of colored stools. They're also reports that she may have had bloody stools as well. Patient was hospitalized for further management. Patient was seen by gastroenterology. She underwent EGD.  Consultants: [facility]  Procedures:  EGD 11/7 Findings: Small hiatal hernia, otherwise normal esophagus. Mild diffuse atrophic-appearing gastritis, otherwise normal stomach. Normal pylorus. Normal duodenum to the second portion. Retroflexion into cardia showed small hiatal hernia, otherwise normal. No old or fresh blood seen to the extent of our examination. The scope was then withdrawn from the patient and the procedure completed. ENDOSCOPIC IMPRESSION: As above. No source of blood in stool noted. Suspect this might have been another diverticular bleed, as was likewise suspected in May 2016.   HOSPITAL COURSE:   GI bleed Remains unclear if she had melanotic stools or she had hematochezia. Thought to be hematochezia. Patient was hospitalized for hematochezia back in May of this  year. It was thought to be diverticular in origin. Patient was seen by gastroenterology. Patient underwent CT scan of the abdomen and pelvis as well as chest due to her history of aortic aneurysm. No evidence for aortoenteric fistula. Patient underwent EGD on 11/7 which did not reveal any source of bleeding. Bleeding is thought to be diverticular. No plans for colonoscopy at this time. Patient should follow-up with her outpatient providers. She has remained stable overnight without any evidence for recurrence of bleeding. Hemoglobin is stable.  UTI with Klebsiella Chin was initially started on ceftriaxone. She'll be discharged on Vantin based on sensitivities.  History of descending thoracic aneurysm Status post graft placement last year. Stable. Type 2 Endoleak is noted. Aneurysm size has increased to 5.5 cm. Discussed with Dr. Myra Gianotti. He will arrange CT scan and follow-up in 6 months.  History of AAA, status post graft placement in the past Stable.   Anemia secondary to acute blood loss Hemoglobin has been stable. She did not require any transfusions.  History of chronic diastolic congestive heart failure Currently well compensated.   History of diabetes mellitus type 2 HbA1c was 5.5 in May. Continue outpatient management.  History of essential hypertension Continue home medications.   History of chronic kidney disease stage III Stable. At baseline.  Overall, stable. Ok for discharge today. Patient ambulated by physical therapy.  PERTINENT LABS:  The results of significant diagnostics from this hospitalization (including imaging, microbiology, ancillary and laboratory) are listed below for reference.    Microbiology: Recent Results (from the past 240 hour(s))  Culture, Urine     Status: None   Collection Time: 10/08/15 10:44 AM  Result Value Ref Range Status   Specimen Description URINE, CLEAN CATCH  Final   Special  Requests NONE  Final   Culture >=100,000 COLONIES/mL  KLEBSIELLA OXYTOCA  Final   Report Status 10/10/2015 FINAL  Final   Organism ID, Bacteria KLEBSIELLA OXYTOCA  Final      Susceptibility   Klebsiella oxytoca - MIC*    AMPICILLIN 16 RESISTANT Resistant     CEFAZOLIN >=64 RESISTANT Resistant     CEFTRIAXONE <=1 SENSITIVE Sensitive     CIPROFLOXACIN <=0.25 SENSITIVE Sensitive     GENTAMICIN <=1 SENSITIVE Sensitive     IMIPENEM <=0.25 SENSITIVE Sensitive     NITROFURANTOIN <=16 SENSITIVE Sensitive     TRIMETH/SULFA <=20 SENSITIVE Sensitive     AMPICILLIN/SULBACTAM 4 SENSITIVE Sensitive     PIP/TAZO <=4 SENSITIVE Sensitive     * >=100,000 COLONIES/mL KLEBSIELLA OXYTOCA     Labs: Basic Metabolic Panel:  Recent Labs Lab 10/08/15 0126 10/08/15 0536 10/09/15 0349 10/10/15 0404  NA 141 141 140 141  K 4.3 4.1 4.1 4.2  CL 108 108 108 106  CO2 23 24 23 25   GLUCOSE 119* 100* 98 104*  BUN 27* 24* 13 13  CREATININE 1.41* 1.37* 1.05* 1.05*  CALCIUM 9.7 9.4 9.3 9.6   Liver Function Tests:  Recent Labs Lab 10/08/15 0126  AST 14*  ALT 9*  ALKPHOS 73  BILITOT 0.4  PROT 6.7  ALBUMIN 3.4*    Recent Labs Lab 10/08/15 0126  LIPASE 45   CBC:  Recent Labs Lab 10/08/15 0126  10/08/15 0536  10/08/15 1959 10/09/15 0349 10/09/15 1202 10/09/15 2017 10/10/15 0404  WBC 5.9  --  6.4  --   --   --   --   --  6.1  HGB 9.4*  < > 8.5*  < > 8.6* 9.0* 9.3* 9.0* 9.2*  HCT 30.6*  < > 28.2*  < > 28.1* 29.1* 30.3* 29.4* 30.8*  MCV 88.4  --  88.4  --   --   --   --   --  89.8  PLT 199  --  167  --   --   --   --   --  203  < > = values in this interval not displayed.  CBG:  Recent Labs Lab 10/09/15 1659 10/09/15 2017 10/10/15 0004 10/10/15 0406 10/10/15 0757  GLUCAP 79 121* 125* 111* 100*     IMAGING STUDIES Ct Angio Chest Aorta W/cm &/or Wo/cm  10/08/2015  CLINICAL DATA:  GI bleed. Inpatient. Three red blood per stool. History of abdominal aortic aneurysm repair in 2012 and thoracic aortic aneurysm endograft repair on  09/27/2014. EXAM: CT ANGIOGRAPHY CHEST, ABDOMEN AND PELVIS TECHNIQUE: Multidetector CT imaging through the chest, abdomen and pelvis was performed using the standard protocol during bolus administration of intravenous contrast. Multiplanar reconstructed images and MIPs were obtained and reviewed to evaluate the vascular anatomy. CONTRAST:  OMNIPAQUE IOHEXOL 350 MG/ML SOLN COMPARISON:  05/08/2015 chest, abdomen and pelvis CT angiogram. FINDINGS: CTA CHEST FINDINGS Mediastinum/Nodes: Stable mild cardiomegaly. No pericardial fluid/thickening. There is atherosclerosis of the thoracic aorta, the great vessels of the mediastinum and the coronary arteries, including calcified atherosclerotic plaque in the left main, left anterior descending, left circumflex and right coronary arteries. Stable position and appearance of the thoracic aortic stent graft extending from the distal aortic arch beyond the left subclavian artery takeoff to the level distal descending thoracic aorta, with no kink or discontinuity. No acute intramural thoracic aortic hemorrhage. The descending thoracic aortic aneurysm sac measures 5.5 cm in maximum transverse diameter, compared to 5.3 cm  on the 05/08/2015 CT using similar measurement technique, slightly increased. Re- demonstrated is a type 2 endoleak in the thoracic aorta, with flow originating in the superior aneurysm sac, with slightly increased endoleak flow in the interval. Normal caliber pulmonary arteries. No central pulmonary emboli. Stable multinodular goiter with dominant 2.2 cm hypodense inferior right thyroid lobe nodule. Normal esophagus. No pathologically enlarged axillary, mediastinal or hilar lymph nodes. Aortic arch branch vessels remain patent. The previously described left subclavian artery dissection is significantly less prominent in the interval, with tiny residual dissection flap at the origin of the left subclavian artery. Lungs/Pleura: No pneumothorax. No pleural  effusion. Stable 10 mm right middle lobe solid pulmonary nodule (series 506/image 20). Stable 3 mm apical left upper lobe pulmonary nodule (506/11). No acute consolidative airspace disease, new significant pulmonary nodules or lung masses. Musculoskeletal:  No aggressive appearing focal osseous lesions. Review of the MIP images confirms the above findings. CTA ABDOMEN AND PELVIS FINDINGS Hepatobiliary: Simple 1.3 cm right liver dome cyst. Otherwise normal liver. Normal gallbladder with no radiopaque cholelithiasis. No biliary ductal dilatation. Pancreas: Normal, with no mass or duct dilation. Spleen: Normal size. No mass. Adrenals/Urinary Tract: Right adrenal 1.4 cm mass is stable since 2011, in keeping with a benign adenoma. Normal left adrenal. Stable subcentimeter hyperdense precontrast renal foci in the upper kidneys bilaterally, too small to characterize, suggestive of benign hemorrhagic renal cysts. Simple 1.7 cm lateral lower left renal cyst, decreased. Simple 1.6 cm medial lower left renal cyst. Simple 1.8 cm anterior interpolar left renal cyst. Additional subcentimeter hypodense renal lesions, too small to characterize. Nonobstructing 6 mm stone in the lower left kidney. No hydronephrosis. Normal bladder. Stomach/Bowel: Grossly normal stomach. Normal caliber small bowel with no small bowel wall thickening. Appendix is not discretely visualized. Diffuse colonic diverticulosis, which is most prominent in the sigmoid colon. No large bowel wall thickening or pericolonic fat stranding. No active contrast extravasation is seen into the bowel lumen. Worsened diastasis in the midline ventral pelvic wall containing small bowel loops, with no small bowel wall thickening, pneumatosis or focal small bowel caliber transition. Vascular/Lymphatic: Stable postsurgical changes status post surgical repair of infrarenal abdominal aortic aneurysm, with no acute abdominal aortic wall thickening. 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 Patent SMA. Stable 0.7 cm distal left main renal artery coarsely calcified aneurysm. No residual hypoperfusion in the upper right kidney. Patent portal, splenic and renal veins. No pathologically enlarged lymph nodes in the abdomen or pelvis. Reproductive: Status post hysterectomy, with no abnormal findings at the vaginal cuff. No right adnexal mass. Stable simple 2.3 cm left adnexal cyst, benign-appearing. Other: No pneumoperitoneum, ascites or focal fluid collection. Musculoskeletal: No aggressive appearing focal osseous lesions. Marked degenerative changes in the lumbar spine. Review of the MIP images confirms the above findings. IMPRESSION: 1. Status post descending thoracic aortic aneurysm stent graft repair with worsening type 2 endoleak and slight growth of the descending thoracic aortic aneurysm sac, now 5.5 cm. Continued chest CT angiography follow-up is advised in 3-6 months if no intervention is performed. 2. Stable pulmonary nodules, largest 10 mm in the right middle lobe. 3. Resolving left subclavian artery dissection, with patent aortic arch branches. 4. Diffuse colonic diverticulosis. No evidence of bowel obstruction or acute bowel inflammation. No active contrast extravasation into the bowel lumen is detected. 5. Stable abdominal aortic aneurysm repair, with no abdominal aortic complication. 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 Electronically Signed   By: Delbert Phenix M.D.   On: 10/08/2015 15:36   Ct Angio Abd/pel W/ And/or W/o  10/08/2015  CLINICAL DATA:  GI bleed. Inpatient. Three red blood per stool. History of abdominal aortic aneurysm repair in 2012 and thoracic aortic aneurysm endograft repair on 09/27/2014. EXAM: CT ANGIOGRAPHY CHEST, ABDOMEN AND PELVIS TECHNIQUE: Multidetector CT imaging through the chest, abdomen  and pelvis was performed using the standard protocol during bolus administration of intravenous contrast. Multiplanar reconstructed images and MIPs were obtained and reviewed to evaluate the vascular anatomy. CONTRAST:  OMNIPAQUE IOHEXOL 350 MG/ML SOLN COMPARISON:  05/08/2015 chest, abdomen and pelvis CT angiogram. FINDINGS: CTA CHEST FINDINGS Mediastinum/Nodes: Stable mild cardiomegaly. No pericardial fluid/thickening. There is atherosclerosis of the thoracic aorta, the great vessels of the mediastinum and the coronary arteries, including calcified atherosclerotic plaque in the left main, left anterior descending, left circumflex and right coronary arteries. Stable position and appearance of the thoracic aortic stent graft extending from the distal aortic arch beyond the left subclavian artery takeoff to the level distal descending thoracic aorta, with no kink or discontinuity. No acute intramural thoracic aortic hemorrhage. The descending thoracic aortic aneurysm sac measures 5.5 cm in maximum transverse diameter, compared to 5.3 cm on the 05/08/2015 CT using similar measurement technique, slightly increased. Re- demonstrated is a type 2 endoleak in the thoracic aorta, with flow originating in the superior aneurysm sac, with slightly increased endoleak flow in the interval. Normal caliber pulmonary arteries. No central pulmonary emboli. Stable multinodular goiter with dominant 2.2 cm hypodense inferior right thyroid lobe nodule. Normal esophagus. No pathologically enlarged axillary, mediastinal or hilar lymph nodes. Aortic arch branch vessels remain patent. The previously described left subclavian artery dissection is significantly less prominent in the interval, with tiny residual dissection flap at the origin of the left subclavian artery. Lungs/Pleura: No pneumothorax. No pleural effusion. Stable 10 mm right middle lobe solid pulmonary nodule (series 506/image 20). Stable 3 mm apical left upper lobe  pulmonary nodule (506/11). No acute consolidative airspace disease, new significant pulmonary nodules or lung masses. Musculoskeletal:  No aggressive appearing focal osseous lesions. Review of the MIP images confirms the above findings. CTA ABDOMEN AND PELVIS FINDINGS Hepatobiliary: Simple 1.3 cm right liver dome cyst. Otherwise normal liver. Normal gallbladder with no radiopaque cholelithiasis. No biliary ductal dilatation. Pancreas: Normal, with no mass or duct dilation. Spleen: Normal size. No mass. Adrenals/Urinary Tract: Right adrenal 1.4 cm mass is stable since 2011, in keeping with a benign adenoma. Normal left adrenal. Stable subcentimeter hyperdense precontrast renal foci in the upper kidneys bilaterally, too small to characterize, suggestive of benign hemorrhagic renal cysts. Simple 1.7 cm lateral lower left renal cyst, decreased. Simple 1.6 cm medial lower left renal cyst. Simple 1.8 cm anterior interpolar left renal cyst. Additional subcentimeter hypodense renal lesions, too small to characterize. Nonobstructing 6 mm stone in the lower left kidney. No hydronephrosis. Normal bladder. Stomach/Bowel: Grossly normal stomach. Normal caliber small bowel with no small bowel wall thickening. Appendix is not discretely visualized. Diffuse colonic diverticulosis, which is most prominent in the sigmoid colon. No large bowel wall thickening or pericolonic fat stranding. No active contrast extravasation is seen into the bowel lumen. Worsened diastasis in the midline ventral pelvic wall containing small bowel loops, with no small bowel wall thickening, pneumatosis or focal small bowel caliber transition. Vascular/Lymphatic: Stable postsurgical changes status post surgical repair of infrarenal abdominal aortic aneurysm,  with no acute abdominal aortic wall thickening. 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 Stable coarsely calcified 0.8 cm distal splenic artery  aneurysm. Patent SMA. Stable 0.7 cm distal left main renal artery coarsely calcified aneurysm. No residual hypoperfusion in the upper right kidney. Patent portal, splenic and renal veins. No pathologically enlarged lymph nodes in the abdomen or pelvis. Reproductive: Status post hysterectomy, with no abnormal findings at the vaginal cuff. No right adnexal mass. Stable simple 2.3 cm left adnexal cyst, benign-appearing. Other: No pneumoperitoneum, ascites or focal fluid collection. Musculoskeletal: No aggressive appearing focal osseous lesions. Marked degenerative changes in the lumbar spine. Review of the MIP images confirms the above findings. IMPRESSION: 1. Status post descending thoracic aortic aneurysm stent graft repair with worsening type 2 endoleak and slight growth of the descending thoracic aortic aneurysm sac, now 5.5 cm. Continued chest CT angiography follow-up is advised in 3-6 months if no intervention is performed. 2. Stable pulmonary nodules, largest 10 mm in the right middle lobe. 3. Resolving left subclavian artery dissection, with patent aortic arch branches. 4. Diffuse colonic diverticulosis. No evidence of bowel obstruction or acute bowel inflammation. No active contrast extravasation into the bowel lumen is detected. 5. Stable abdominal aortic aneurysm repair, with no abdominal aortic complication. 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 Electronically Signed   By: Delbert Phenix M.D.   On: 10/08/2015 15:36    DISCHARGE EXAMINATION: Filed Vitals:   10/09/15 2018 10/10/15 0005 10/10/15 0408 10/10/15 0802  BP: 146/60 150/59 165/63 165/61  Pulse: 71 73 72 67  Temp: 97.7 F (36.5 C) 98.1 F (36.7 C) 98.2 F (36.8 C) 98.4 F (36.9 C)  TempSrc: Oral Oral Oral Oral  Resp: 18 16 16 16   Height:      Weight:      SpO2: 96% 93% 92% 97%   General appearance: alert, cooperative, appears stated age and no distress Resp: clear to auscultation  bilaterally Cardio: regular rate and rhythm, S1, S2 normal, no murmur, click, rub or gallop GI: soft, non-tender; bowel sounds normal; no masses,  no organomegaly Extremities: extremities normal, atraumatic, no cyanosis or edema  DISPOSITION: Home  Discharge Instructions    Call MD for:  difficulty breathing, headache or visual disturbances    Complete by:  As directed      Call MD for:  extreme fatigue    Complete by:  As directed      Call MD for:  persistant dizziness or light-headedness    Complete by:  As directed      Call MD for:  persistant nausea and vomiting    Complete by:  As directed      Call MD for:  severe uncontrolled pain    Complete by:  As directed      Call MD for:  temperature >100.4    Complete by:  As directed      Diet - low sodium heart healthy    Complete by:  As directed      Discharge instructions    Complete by:  As directed   Please be sure to follow up with your PCP within 1 week. Your urine culture report is still pending. Please ask your PCP for results at follow up.  You were cared for by a hospitalist during your hospital stay. If you have any questions about your discharge medications or the care you received while you were in  the hospital after you are discharged, you can call the unit and asked to speak with the hospitalist on call if the hospitalist that took care of you is not available. Once you are discharged, your primary care physician will handle any further medical issues. Please note that NO REFILLS for any discharge medications will be authorized once you are discharged, as it is imperative that you return to your primary care physician (or establish a relationship with a primary care physician if you do not have one) for your aftercare needs so that they can reassess your need for medications and monitor your lab values. If you do not have a primary care physician, you can call [phone] for a physician referral.     Increase activity slowly     Complete by:  As directed            ALLERGIES:  Allergies  Allergen Reactions  . Sulfur     Unknown reaction   . Tape Rash and Other (See Comments)    "paper" tape please, adhesive tape "burns'     Discharge Medication List as of 10/10/2015 11:45 AM    START taking these medications   Details  cefpodoxime (VANTIN) 200 MG tablet Take 1 tablet (200 mg total) by mouth daily. Starting 11/9 for 4 more days, Starting 10/11/2015, Until Discontinued, Normal      CONTINUE these medications which have NOT CHANGED   Details  amLODipine (NORVASC) 10 MG tablet Take 10 mg by mouth daily., Until Discontinued, Historical Med    atorvastatin (LIPITOR) 20 MG tablet Take 1 tablet (20 mg total) by mouth daily at 6 PM., Starting 09/12/2014, Until Discontinued, Normal    calcium carbonate (OS-CAL) 600 MG TABS tablet Take 600 mg by mouth 2 (two) times daily with a meal. Takes in the morning and at lunch time, Until Discontinued, Historical Med    [employer] STOOL SOFTENER 100 MG capsule Take 100 mg by mouth daily., Starting 05/31/2015, Until Discontinued, Historical Med    ferrous sulfate 325 (65 FE) MG tablet Take 1 tablet (325 mg total) by mouth 2 (two) times daily with a meal., Starting 03/04/2015, Until Discontinued, Normal    furosemide (LASIX) 20 MG tablet Take 1 tablet (20 mg total) by mouth daily., Starting 04/04/2015, Until Discontinued, Print    gemfibrozil (LOPID) 600 MG tablet Take 600 mg by mouth 2 (two) times daily., Starting 01/12/2015, Until Discontinued, Historical Med    glipiZIDE (GLIPIZIDE XL) 2.5 MG 24 hr tablet Take 1 tablet (2.5 mg total) by mouth daily with breakfast., Starting 04/04/2015, Until Discontinued, Print    KLOR-CON M20 20 MEQ tablet Take 20 mEq by mouth daily., Starting 06/22/2015, Until Discontinued, Historical Med    labetalol (NORMODYNE) 100 MG tablet Take 1 tablet (100 mg total) by mouth 2 (two) times daily., Starting 04/04/2015, Until Discontinued, Print    Nutritional  Supplements (JUICE PLUS FIBRE PO) Take 3 tablets by mouth daily. , Until Discontinued, Historical Med    omeprazole (PRILOSEC) 40 MG capsule Take 40 mg by mouth daily., Starting 03/17/2015, Until Discontinued, Historical Med    Polyethyl Glycol-Propyl Glycol (SYSTANE OP) Place 1 drop into both eyes 2 (two) times daily. , Until Discontinued, Historical Med    Vitamin D, Ergocalciferol, (DRISDOL) 50000 UNITS CAPS capsule Take 50,000 Units by mouth every Saturday. , Until Discontinued, Historical Med       Follow-up Information    Follow up with Shamleffer, Landry Mellow, MD. Schedule an appointment as soon as  possible for a visit on 10/16/2015.   Specialty:  Internal Medicine   Why:  post hospitalization follow up   Appointment with Dr. Lonzo Cloud is on 10/16/15 at 10:15am   Contact information:   [address]  [address] [place] [address] [phone]       Follow up with Durene Cal, MD.   Specialties:  Vascular Surgery, Cardiology   Why:  his office will schedule follow up in 6 months for CT scan to evaluate your aneurysm   Contact information:   [address] [place] [address] [phone]       Follow up with Shirley Friar., MD. Schedule an appointment as soon as possible for a visit on 11/03/2015.   Specialty:  Gastroenterology   Why:  post hospitalization follow up  Appoinment with Dr. Ewing Schlein is on 11/03/15 at 12:45 for an 1pm appoinment   Contact information:   [address] [place] [address] [phone]       TOTAL DISCHARGE TIME: 35 minutes  [facility]  [facility] Pager [phone]  10/10/2015, 1:59 PM

## 2015-10-10 NOTE — Anesthesia Postprocedure Evaluation (Signed)
  Anesthesia Post-op Note  Patient: Jennifer [name]  Procedure(s) Performed: Procedure(s): ESOPHAGOGASTRODUODENOSCOPY (EGD) WITH PROPOFOL (Left)  Patient Location: PACU and Endoscopy Unit  Anesthesia Type:MAC  Level of Consciousness: awake  Airway and Oxygen Therapy: Patient Spontanous Breathing  Post-op Pain: none  Post-op Assessment: Post-op Vital signs reviewed, Patient's Cardiovascular Status Stable, Respiratory Function Stable, Patent Airway, No signs of Nausea or vomiting and Pain level controlled              Post-op Vital Signs: Reviewed  Last Vitals:  Filed Vitals:   10/10/15 0802  BP: 165/61  Pulse: 67  Temp: 36.9 C  Resp: 16    Complications: No apparent anesthesia complications

## 2015-10-10 NOTE — Progress Notes (Signed)
Subjective: No pain. No bleeding in ~ 48 hours. Tolerating diet.  Objective: Vital signs in last 24 hours: Temp:  [97.7 F (36.5 C)-98.9 F (37.2 C)] 98.4 F (36.9 C) (11/08 0802) Pulse Rate:  [67-73] 67 (11/08 0802) Resp:  [16-20] 16 (11/08 0802) BP: (136-172)/(52-63) 165/61 mmHg (11/08 0802) SpO2:  [92 %-100 %] 97 % (11/08 0802) Weight change:  Last BM Date: 10/09/15  PE: GEN:  NAD ABD:  Soft  Lab Results: CBC    Component Value Date/Time   WBC 6.1 10/10/2015 0404   RBC 3.43* 10/10/2015 0404   RBC 2.65* 03/01/2015 0359   HGB 9.2* 10/10/2015 0404   HCT 30.8* 10/10/2015 0404   PLT 203 10/10/2015 0404   MCV 89.8 10/10/2015 0404   MCH 26.8 10/10/2015 0404   MCHC 29.9* 10/10/2015 0404   RDW 19.8* 10/10/2015 0404   LYMPHSABS 1.8 04/24/2015 0600   MONOABS 0.7 04/24/2015 0600   EOSABS 0.3 04/24/2015 0600   BASOSABS 0.0 04/24/2015 0600   CMP     Component Value Date/Time   NA 141 10/10/2015 0404   K 4.2 10/10/2015 0404   CL 106 10/10/2015 0404   CO2 25 10/10/2015 0404   GLUCOSE 104* 10/10/2015 0404   BUN 13 10/10/2015 0404   CREATININE 1.05* 10/10/2015 0404   CREATININE 1.29* 05/04/2015 1133   CALCIUM 9.6 10/10/2015 0404   PROT 6.7 10/08/2015 0126   ALBUMIN 3.4* 10/08/2015 0126   AST 14* 10/08/2015 0126   ALT 9* 10/08/2015 0126   ALKPHOS 73 10/08/2015 0126   BILITOT 0.4 10/08/2015 0126   GFRNONAA 46* 10/10/2015 0404   GFRAA 53* 10/10/2015 0404   Assessment:  1.  Blood in stool, resolved, suspected diverticular. 2.  Anemia, acute blood loss, stable.  Plan:  1.  Advance diet. 2.  Follow CBCs. 3.  OK to discharge home later today or early tomorrow, from GI perspective, with [facility] [phone] within the next 3-4 weeks. 4.  Will sign-off; please call with questions; thank you for the consultation.   Freddy JakschOUTLAW,Asianna Brundage M 10/10/2015, 9:20 AM   Pager [phone] If no answer or after 5 PM call [phone]

## 2015-10-10 NOTE — Discharge Instructions (Signed)

## 2015-10-10 NOTE — Care Management Note (Signed)
Case Management Note  Patient Details  Name: Gershon MusselJanie B Medders MRN: [id number] Date of Birth: 01/22/1925  Subjective/Objective:                 Independent patient admitted from home, lives with spouse. Acute blood loss anemia from suspected diverticular bleed. Had EGD and CT abd.    Action/Plan:  Expect discharge to home today, no CM needs.   Expected Discharge Date:                  Expected Discharge Plan:  Home/Self Care  In-House Referral:     Discharge planning Services  CM Consult  Post Acute Care Choice:    Choice offered to:     DME Arranged:    DME Agency:     HH Arranged:    HH Agency:     Status of Service:  Completed, signed off  Medicare Important Message Given:    Date Medicare IM Given:    Medicare IM give by:    Date Additional Medicare IM Given:    Additional Medicare Important Message give by:     If discussed at Long Length of Stay Meetings, dates discussed:    Additional Comments:  Lawerance SabalDebbie Jessyca Sloan, RN 10/10/2015, 11:12 AM

## 2015-10-10 NOTE — Progress Notes (Signed)
Nsg Discharge Note  Admit Date:  10/08/2015 Discharge date: 10/10/2015   Jennifer [name] to be D/C'd Home per MD order.  AVS completed.  Copy for chart, and copy for patient signed, and dated. Patient/caregiver able to verbalize understanding.  Discharge Medication:   Medication List    TAKE these medications        amLODipine 10 MG tablet  Commonly known as:  NORVASC  Take 10 mg by mouth daily.     atorvastatin 20 MG tablet  Commonly known as:  LIPITOR  Take 1 tablet (20 mg total) by mouth daily at 6 PM.     calcium carbonate 600 MG Tabs tablet  Commonly known as:  OS-CAL  Take 600 mg by mouth 2 (two) times daily with a meal. Takes in the morning and at lunch time     cefpodoxime 200 MG tablet  Commonly known as:  VANTIN  Take 1 tablet (200 mg total) by mouth daily. Starting 11/9 for 4 more days  Start taking on:  10/11/2015     [employer] STOOL SOFTENER 100 MG capsule  Generic drug:  docusate sodium  Take 100 mg by mouth daily.     ferrous sulfate 325 (65 FE) MG tablet  Take 1 tablet (325 mg total) by mouth 2 (two) times daily with a meal.     furosemide 20 MG tablet  Commonly known as:  LASIX  Take 1 tablet (20 mg total) by mouth daily.     gemfibrozil 600 MG tablet  Commonly known as:  LOPID  Take 600 mg by mouth 2 (two) times daily.     glipiZIDE 2.5 MG 24 hr tablet  Commonly known as:  GLIPIZIDE XL  Take 1 tablet (2.5 mg total) by mouth daily with breakfast.     JUICE PLUS FIBRE PO  Take 3 tablets by mouth daily.     KLOR-CON M20 20 MEQ tablet  Generic drug:  potassium chloride SA  Take 20 mEq by mouth daily.     labetalol 100 MG tablet  Commonly known as:  NORMODYNE  Take 1 tablet (100 mg total) by mouth 2 (two) times daily.     omeprazole 40 MG capsule  Commonly known as:  PRILOSEC  Take 40 mg by mouth daily.     SYSTANE OP  Place 1 drop into both eyes 2 (two) times daily.     Vitamin D (Ergocalciferol) 50000 UNITS Caps capsule  Commonly known as:   DRISDOL  Take 50,000 Units by mouth every Saturday.        Discharge Assessment: Filed Vitals:   10/10/15 0802  BP: 165/61  Pulse: 67  Temp: 98.4 F (36.9 C)  Resp: 16   Skin clean, dry and intact without evidence of skin break down, no evidence of skin tears noted. IV catheter discontinued intact. Site without signs and symptoms of complications - no redness or edema noted at insertion site, patient denies c/o pain - only slight tenderness at site.  Dressing with slight pressure applied.  D/c Instructions-Education: Discharge instructions given to patient/family with verbalized understanding. D/c education completed with patient/family including follow up instructions, medication list, d/c activities limitations if indicated, with other d/c instructions as indicated by MD - patient able to verbalize understanding, all questions fully answered. Patient instructed to return to ED, call 911, or call MD for any changes in condition.  Patient escorted via WC, and D/C home via private auto.  Jennifer ReapBrumagin, Jennifer Chipman L, RN 10/10/2015 12:02 PM

## 2015-10-10 NOTE — Progress Notes (Signed)
Merdis DelayK. Schorr MD notified regarding patient's BP of 165/63.

## 2016-02-29 ENCOUNTER — Ambulatory Visit (INDEPENDENT_AMBULATORY_CARE_PROVIDER_SITE_OTHER): Payer: Medicare Other | Admitting: Interventional Cardiology

## 2016-02-29 ENCOUNTER — Encounter: Payer: Self-pay | Admitting: Interventional Cardiology

## 2016-02-29 VITALS — BP 138/80 | HR 70 | Ht 63.6 in | Wt 144.8 lb

## 2016-02-29 DIAGNOSIS — I10 Essential (primary) hypertension: Secondary | ICD-10-CM | POA: Diagnosis not present

## 2016-02-29 DIAGNOSIS — E785 Hyperlipidemia, unspecified: Secondary | ICD-10-CM | POA: Diagnosis not present

## 2016-02-29 NOTE — Patient Instructions (Signed)
**Note De-identified  Obfuscation** Medication Instructions:  Same-no changes  Labwork: None  Testing/Procedures: None  Follow-Up: Your physician wants you to follow-up in: 1 year. You will receive a reminder letter in the mail two months in advance. If you don't receive a letter, please call our office to schedule the follow-up appointment.      If you need a refill on your cardiac medications before your next appointment, please call your pharmacy.   

## 2016-02-29 NOTE — Progress Notes (Signed)
Patient ID: Jennifer [name], female   DOB: 09-27-1925, 82 y.o.   MRN: [id number]     Cardiology Office Note   Date:  02/29/2016   ID:  Jennifer [name], DOB 01/21/1925, MRN [id number]  PCP:  Lonzo Cloud, Landry Mellow, MD  Cardiologist:   Corky Crafts., MD   No chief complaint on file. h/o AAA    History of Present Illness: Jennifer [name] is a 82 y.o. female who presents for f/u of AAA.  SHe had an aneurysm repair in 2012.  She required stent placement to the aortia in 2015.  She has done well.  She still has occasional back pain.  She had a negative stress test in 10/15.  She had a complicated course with a rehab stay at [facility] place for weakness.  She has improved.    BP readings at home are better controlled.  130-140s typically at home.  Her husband checks her BP at home.  Overall, readings are controlled.   She is trying to exercise a little.  She has no exertional sx.        Past Medical History  Diagnosis Date  . Diabetes mellitus without complication (HCC)   . Hypertension   . AAA (abdominal aortic aneurysm)-repaired   . Irregular heart beat   . Acute bronchitis   . Tachycardia     Unspecified  . Shortness of breath dyspnea   . GERD (gastroesophageal reflux disease)   . Anemia   . CHF (congestive heart failure) [facility])     Past Surgical History  Procedure Laterality Date  . Abdominal hysterectomy      40 years ago, intestinal damage  . Abdominal aortic aneurysm repair  2012    Dr. Arbie Cookey  . Breast lumpectomy Left     Benign Mass  . Thoracic aortic endovascular stent graft N/A 09/27/2014    Procedure: THORACIC AORTIC ENDOVASCULAR STENT GRAFT;  Surgeon: Nada Libman, MD;  Location: [facility] OR;  Service: Vascular;  Laterality: N/A;  . Oophorectomy      40 years ago  . Incision and drainage abscess N/A 02/28/2015    Procedure: INCISION AND DRAINAGE ABSCESS ABDOMINAL WALL;  Surgeon: Claud Kelp, MD;  Location: [facility] OR;  Service: General;  Laterality: N/A;  .  Esophagogastroduodenoscopy (egd) with propofol Left 10/09/2015    Procedure: ESOPHAGOGASTRODUODENOSCOPY (EGD) WITH PROPOFOL;  Surgeon: Willis Modena, MD;  Location: [facility] ENDOSCOPY;  Service: Endoscopy;  Laterality: Left;     Current Outpatient Prescriptions  Medication Sig Dispense Refill  . amLODipine (NORVASC) 10 MG tablet Take 10 mg by mouth daily.    Marland Kitchen atorvastatin (LIPITOR) 20 MG tablet Take 20 mg by mouth daily.    . calcium carbonate (OS-CAL) 600 MG TABS tablet Take 600 mg by mouth 2 (two) times daily with a meal. Takes in the morning and at lunch time    . CVS STOOL SOFTENER 100 MG capsule Take 100 mg by mouth daily.  5  . ferrous sulfate 325 (65 FE) MG tablet Take 1 tablet (325 mg total) by mouth 2 (two) times daily with a meal. 60 tablet 0  . furosemide (LASIX) 20 MG tablet Take 20 mg by mouth daily.    Marland Kitchen gemfibrozil (LOPID) 600 MG tablet Take 600 mg by mouth 2 (two) times daily.  5  . glipiZIDE (GLIPIZIDE XL) 2.5 MG 24 hr tablet Take 1 tablet (2.5 mg total) by mouth daily with breakfast. 30 tablet 0  . KLOR-CON M20 20 MEQ tablet Take  20 mEq by mouth daily.  2  . labetalol (NORMODYNE) 100 MG tablet Take 1 tablet (100 mg total) by mouth 2 (two) times daily. 60 tablet 0  . Nutritional Supplements (JUICE PLUS FIBRE PO) Take 3 tablets by mouth daily.     Marland Kitchen omeprazole (PRILOSEC) 40 MG capsule Take 40 mg by mouth daily.  2  . Polyethyl Glycol-Propyl Glycol (SYSTANE OP) Place 1 drop into both eyes 2 (two) times daily.     . Vitamin D, Ergocalciferol, (DRISDOL) 50000 UNITS CAPS capsule Take 50,000 Units by mouth every Saturday.      No current facility-administered medications for this visit.    Allergies:   Sulfur; Sulfamethoxazole; and Tape    Social History:  The patient  reports that she has never smoked. She has never used smokeless tobacco. She reports that she does not drink alcohol or use illicit drugs.   Family History:  The patient's family history includes Cancer in her  brother; Heart attack in her brother, mother, and sister; Heart disease in her sister.    ROS:  Please see the history of present illness.   Otherwise, review of systems are positive for weakness-improving.   All other systems are reviewed and negative.    PHYSICAL EXAM: VS:  BP 138/80 mmHg  Pulse 70  Ht 5' 3.6" (1.615 m)  Wt 144 lb 12.8 oz (65.681 kg)  BMI 25.18 kg/m2 , BMI Body mass index is 25.18 kg/(m^2). GEN: Well nourished, well developed, in no acute distress HEENT: normal Neck: no JVD, carotid bruits, or masses Cardiac: RRR; no murmurs, rubs, or gallops,no edema  Respiratory:  clear to auscultation bilaterally, normal work of breathing GI: soft, nontender, nondistended, + BS MS: no deformity or atrophy Skin: warm and dry, no rash Neuro:  Strength and sensation are intact Psych: euthymic mood, full affect   EKG:  EKG is not ordered today.    Recent Labs: 03/31/2015: Magnesium 1.8; TSH 0.662 06/22/2015: B Natriuretic Peptide 379.1* 10/08/2015: ALT 9* 10/10/2015: BUN 13; Creatinine, Ser 1.05*; Hemoglobin 9.2*; Platelets 203; Potassium 4.2; Sodium 141    Lipid Panel    Component Value Date/Time   CHOL 138 02/20/2015 0933   TRIG 117.0 02/20/2015 0933   HDL 43.60 02/20/2015 0933   CHOLHDL 3 02/20/2015 0933   VLDL 23.4 02/20/2015 0933   LDLCALC 71 02/20/2015 0933      Wt Readings from Last 3 Encounters:  02/29/16 144 lb 12.8 oz (65.681 kg)  10/08/15 136 lb 4.8 oz (61.825 kg)  08/22/15 120 lb (54.432 kg)      Other studies Reviewed: Additional studies/ records that were reviewed today include: records from Dr. Myra Gianotti Review of the above records demonstrates: endovascular stent placement   ASSESSMENT AND PLAN:  1.  AAA: s/p repair.  Follows with Dr. Myra Gianotti. 2. HTN: Controlled. Continue current meds. 3. Hyperlipidemia: LDL target < 100.  She will need lipids checked.  She'll be seeing her primary care doctor in a few weeks. She'll have her lab work checked  there. Continue atorvastatin.   Current medicines are reviewed at length with the patient today.  The patient does not have concerns regarding medicines.  The following changes have been made:  no change  Labs/ tests ordered today include: will check CBC., CMet, lipids   Encourage more exercise. She should try to get at least 150 minutes of aerobic exercise per week. She will have to build up to that level.  THey have a stationary bike which  may be good for her to reduce the risk of falling.   Disposition:   FU with me in 1 year   Delorise JacksonSigned, VARANASI,JAYADEEP S., MD  02/29/2016 11:41 AM    [facility] [address], [place], [place]  [address] Phone: [phone]; Fax: [phone]) [phone]

## 2016-03-03 IMAGING — CR DG CHEST 2V
1 series · 1 of 1 positions shown · non-contrast
Comparison: 09/02/2014

CLINICAL DATA: Pleural effusions, history diabetes mellitus without
complication, hypertension, abdominal aortic aneurysm

EXAM:
CHEST  2 VIEW

[view not recorded]
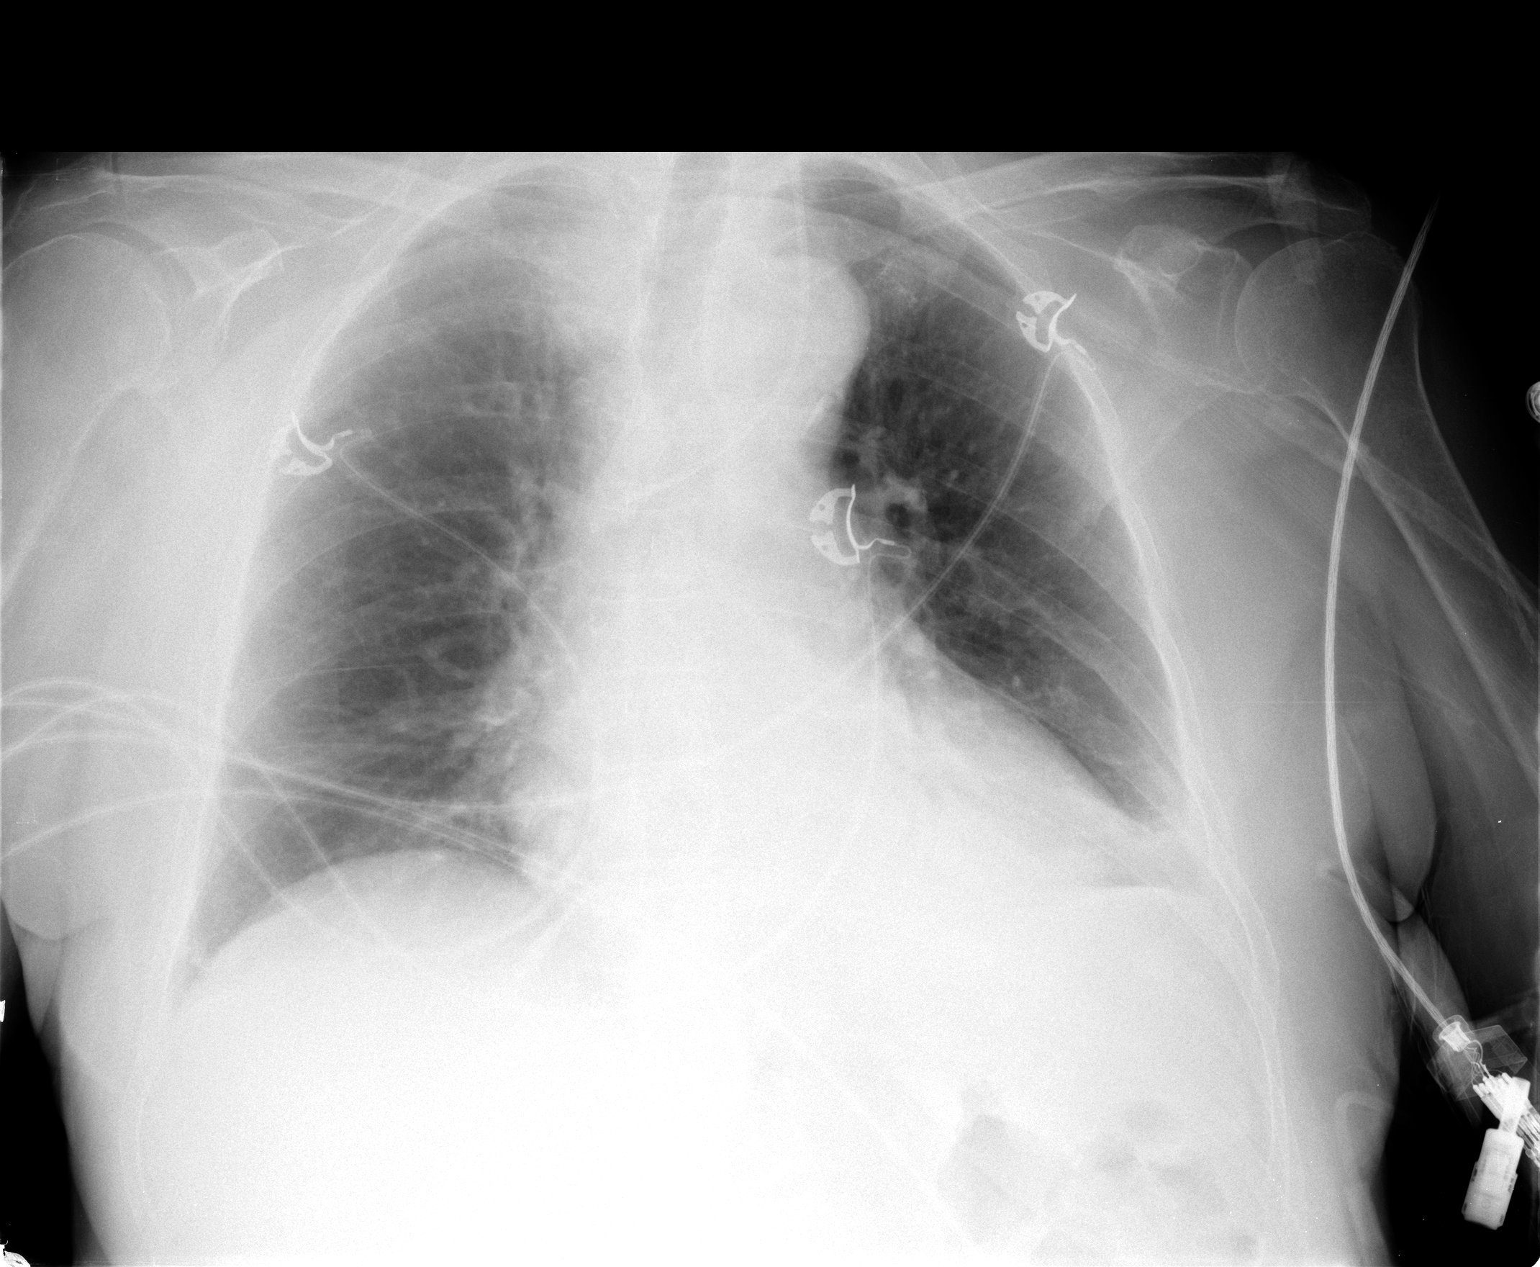

[1 of 1 positions shown; findings below may reference images not displayed]

FINDINGS: LEFT jugular central venous catheter with tip projecting over
confluence of the SVC and LEFT brachiocephalic vein.

Enlargement of cardiac silhouette.

Atherosclerotic calcification aorta.

Mediastinal contours and pulmonary vascularity normal.

Slightly improved atelectasis versus consolidation in LEFT lower
lobe.

Minimal RIGHT base atelectasis.

Upper lungs clear.

No pneumothorax.

Minimal LEFT pleural effusion.
IMPRESSION: Improving aeration LEFT lower lobe.

Enlargement of cardiac silhouette with minimal RIGHT base
atelectasis.

## 2016-05-03 ENCOUNTER — Encounter: Payer: Self-pay | Admitting: Surgery

## 2016-05-06 ENCOUNTER — Ambulatory Visit
Admission: RE | Admit: 2016-05-06 | Discharge: 2016-05-06 | Disposition: A | Discharge status: Home / Self Care | Payer: Medicare Other | Source: Ambulatory Visit | Attending: Surgery | Admitting: Surgery

## 2016-05-06 DIAGNOSIS — I712 Thoracic aortic aneurysm, without rupture, unspecified: Secondary | ICD-10-CM

## 2016-05-06 DIAGNOSIS — Z48812 Encounter for surgical aftercare following surgery on the circulatory system: Secondary | ICD-10-CM

## 2016-05-06 MED ORDER — IOPAMIDOL (ISOVUE-370) INJECTION 76%
50.0000 mL | Freq: Once | INTRAVENOUS | Status: AC | PRN
  Administered 2016-05-06: 50 mL via INTRAVENOUS

## 2016-05-13 ENCOUNTER — Other Ambulatory Visit: Payer: Self-pay | Admitting: *Deleted

## 2016-05-13 ENCOUNTER — Ambulatory Visit (INDEPENDENT_AMBULATORY_CARE_PROVIDER_SITE_OTHER): Payer: Medicare Other | Admitting: Surgery

## 2016-05-13 ENCOUNTER — Encounter: Payer: Self-pay | Admitting: Surgery

## 2016-05-13 VITALS — BP 158/78 | HR 68 | Temp 97.0°F | Resp 16 | Ht 63.0 in | Wt 151.0 lb

## 2016-05-13 DIAGNOSIS — I712 Thoracic aortic aneurysm, without rupture, unspecified: Secondary | ICD-10-CM

## 2016-05-13 NOTE — Progress Notes (Signed)
Vascular and Vein Specialist of [facility]  Patient name: Jennifer [name] MRN: [id number] DOB: [birth year] Sex: female  REASON FOR VISIT: follow up  HPI: Jennifer [name] is a 82 y.o. female who initially presented with left-sided chest pain. 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 She was treated medically for this and discharged to home with follow-up in my office. When I evaluated her, she was having persistent back pain which had gotten worse on the day that I saw her. I repeated her CT scan which did not show any significant changes. She was scheduled for expedited endovascular repair of her aneurysm. On 09/27/2014 she underwent endovascular repair of her aneurysm using a Gore CTAG device. She had a distal extension placed as well. This was done through left common femoral artery access. Following her procedure she did have persistent pain. A CT scan was obtained which showed that the stent graft was in good position and that the aneurysm was completely excluded. She did have a new finding of a subclavian artery dissection as well as narrowing of her left distal external / common femoral artery.    She has no significant complaints today.  Past Medical History  Diagnosis Date  . Diabetes mellitus without complication (HCC)   . Hypertension   . AAA (abdominal aortic aneurysm)-repaired   . Irregular heart beat   . Acute bronchitis   . Tachycardia     Unspecified  . Shortness of breath dyspnea   . GERD (gastroesophageal reflux disease)   . Anemia   . CHF (congestive heart failure) (HCC)     Family History  Problem Relation Age of Onset  . Heart disease Sister   . Cancer Brother   . Heart attack Mother   . Heart attack Sister   . Heart attack Brother     SOCIAL HISTORY: Social History  Substance Use Topics  . Smoking status: Never Smoker   . Smokeless tobacco: Never  Used     Comment: quit smoking in 1975  . Alcohol Use: No    Allergies  Allergen Reactions  . Sulfur     Unknown reaction   . Sulfamethoxazole Rash  . Tape Rash and Other (See Comments)    "paper" tape please, adhesive tape "burns'    Current Outpatient Prescriptions  Medication Sig Dispense Refill  . amLODipine (NORVASC) 10 MG tablet Take 10 mg by mouth daily.    Marland Kitchen atorvastatin (LIPITOR) 20 MG tablet Take 20 mg by mouth daily.    . calcium carbonate (OS-CAL) 600 MG TABS tablet Take 600 mg by mouth 2 (two) times daily with a meal. Takes in the morning and at lunch time    . CVS STOOL SOFTENER 100 MG capsule Take 100 mg by mouth daily.  5  . ferrous sulfate 325 (65 FE) MG tablet Take 1 tablet (325 mg total) by mouth 2 (two) times daily with a meal. 60 tablet 0  . furosemide (LASIX) 20 MG tablet Take 20 mg by mouth daily.    Marland Kitchen gemfibrozil (LOPID) 600 MG tablet Take 600 mg by mouth 2 (two) times daily.  5  . glipiZIDE (GLIPIZIDE XL) 2.5 MG 24 hr tablet Take 1 tablet (2.5 mg total) by mouth daily with breakfast. 30 tablet 0  . KLOR-CON M20 20 MEQ tablet Take 20 mEq by mouth daily.  2  . labetalol (NORMODYNE) 100 MG tablet Take 1 tablet (100 mg total) by mouth 2 (  two) times daily. 60 tablet 0  . Nutritional Supplements (JUICE PLUS FIBRE PO) Take 3 tablets by mouth daily.     Marland Kitchen omeprazole (PRILOSEC) 40 MG capsule Take 40 mg by mouth daily.  2  . Polyethyl Glycol-Propyl Glycol (SYSTANE OP) Place 1 drop into both eyes 2 (two) times daily.     . Vitamin D, Ergocalciferol, (DRISDOL) 50000 UNITS CAPS capsule Take 50,000 Units by mouth every Saturday. Reported on 05/13/2016     No current facility-administered medications for this visit.    REVIEW OF SYSTEMS:   denotes positive finding,  denotes negative finding Cardiac  Comments:  Chest pain or chest pressure:    Shortness of breath upon exertion:    Short of breath when lying flat:    Irregular heart rhythm:        Vascular      Pain in calf, thigh, or hip brought on by ambulation:    Pain in feet at night that wakes you up from your sleep:     Blood clot in your veins:    Leg swelling:         Pulmonary    Oxygen at home:    Productive cough:     Wheezing:         Neurologic    Sudden weakness in arms or legs:     Sudden numbness in arms or legs:     Sudden onset of difficulty speaking or slurred speech:    Temporary loss of vision in one eye:     Problems with dizziness:         Gastrointestinal    Blood in stool:     Vomited blood:         Genitourinary    Burning when urinating:     Blood in urine:        Psychiatric    Major depression:         Hematologic    Bleeding problems:    Problems with blood clotting too easily:        Skin    Rashes or ulcers:        Constitutional    Fever or chills:      PHYSICAL EXAM: Filed Vitals:   05/13/16 1035 05/13/16 1043  BP: 155/77 158/78  Pulse: 68 68  Temp: 97 F (36.1 C)   TempSrc: Oral   Resp: 16   Height:  (1.6 m)   Weight: 151 lb (68.493 kg)   SpO2: 95%     GENERAL: The patient is a well-nourished female, in no acute distress. The vital signs are documented above. CARDIAC: There is a regular rate and rhythm.  PULMONARY: There is good air exchange bilaterally without wheezing or rales. ABDOMEN: Soft and non-tender with normal pitched bowel sounds.  MUSCULOSKELETAL: There are no major deformities or cyanosis. NEUROLOGIC: No focal weakness or paresthesias are detected. SKIN: There are no ulcers or rashes noted. PSYCHIATRIC: The patient has a normal affect.  DATA:  I have ordered and reviewed her CT scan with the following findings: 1. Patent thoracic aortic stent graft with persistent type 2 endoleak and enlarging native aneurysm sac diameter 6.7 x 5.9 cm (previously 6.4 x 5.5). 2. Short-segment stenosis in the mid right external iliac artery, of possible hemodynamic significance. Correlate with clinical symptomatology  and ABIs. 3. 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 4. 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 5. 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 6. Left nephrolithiasis without hydronephrosis. 7. Sigmoid diverticulosis. 8. Infraumbilical ventral hernia containing small bowel without obstruction or strangulation. 9. 11 mm right middle lobe pulmonary nodule, minimally increased since 2011, presumably benign. Marland Kitchen.  MEDICAL ISSUES: The patient has a persistent type II endoleak following endovascular repair.  She has had a increase in the size of her aneurysm.  It now measures 6.7 cm.  Because of the aneurysmal growth, I'm going to refer her to interventional radiology for consideration of embolization of her endoleak.  I have her scheduled for follow with me in 6 months    Durene CalWells Jefferie Holston, MD [facility] Tel [phone] Pager [phone]

## 2016-05-14 ENCOUNTER — Other Ambulatory Visit: Payer: Self-pay | Admitting: Surgery

## 2016-05-14 ENCOUNTER — Telehealth: Payer: Self-pay | Admitting: Surgery

## 2016-05-14 DIAGNOSIS — T82330A Leakage of aortic (bifurcation) graft (replacement), initial encounter: Secondary | ICD-10-CM

## 2016-05-14 DIAGNOSIS — IMO0001 Reserved for inherently not codable concepts without codable children: Secondary | ICD-10-CM

## 2016-05-14 NOTE — Telephone Encounter (Signed)
Spoke with pt - IR appt 05/21/16 9:15 am [address] [phone]. Pt verbalized understanding.

## 2016-05-16 ENCOUNTER — Encounter: Payer: Self-pay | Admitting: Cardiology

## 2016-05-21 ENCOUNTER — Ambulatory Visit
Admission: RE | Admit: 2016-05-21 | Discharge: 2016-05-21 | Disposition: A | Discharge status: Home / Self Care | Payer: Medicare Other | Source: Ambulatory Visit | Attending: Surgery | Admitting: Surgery

## 2016-05-21 DIAGNOSIS — IMO0001 Reserved for inherently not codable concepts without codable children: Secondary | ICD-10-CM

## 2016-05-21 DIAGNOSIS — T82330A Leakage of aortic (bifurcation) graft (replacement), initial encounter: Secondary | ICD-10-CM

## 2016-05-21 NOTE — Consult Note (Signed)
Chief Complaint: Patient was seen in consultation today for  Chief Complaint  Patient presents with  . Advice Only    Consult for Type 2 Endoleak   at the request of Brabham,Vance W  Referring Physician(s): Brabham,Vance W  History of Present Illness: Jennifer [name] is a 82 y.o. female with a history of thoracic aortic aneurysm and intramural hematoma. Patient underwent endovascular repair of the thoracic aortic aneurysm on 09/25/2014. Patient also has a history of an open abdominal aortic aneurysm repair. Follow-up CTA images of the chest demonstrate an endoleak and there has been interval enlargement of the thoracic aneurysm sac. The patient denies any chest symptoms. She has no chest pain or shortness of breath. Patient denies abdominal pain and GI bleeding. Patient has no urinary symptoms. Patient's main complaint is fatigue, tiredness and low back pain. Patient is trying to use the treadmill approximately 3 times a week for 5-10 minutes. Otherwise, the patient is sedentary and unable to do routine house chores. The patient was accompanied by her husband. The couple of lives at home alone but they do have a woman that I does most of their household chores for them.  Past Medical History  Diagnosis Date  . Diabetes mellitus without complication (HCC)   . Hypertension   . AAA (abdominal aortic aneurysm)-repaired   . Irregular heart beat   . Acute bronchitis   . Tachycardia     Unspecified  . Shortness of breath dyspnea   . GERD (gastroesophageal reflux disease)   . Anemia   . CHF (congestive heart failure) [facility])     Past Surgical History  Procedure Laterality Date  . Abdominal hysterectomy      40 years ago, intestinal damage  . Abdominal aortic aneurysm repair  2012    Dr. Arbie Cookey  . Breast lumpectomy Left     Benign Mass  . Thoracic aortic endovascular stent graft N/A 09/27/2014    Procedure: THORACIC AORTIC ENDOVASCULAR STENT GRAFT;  Surgeon: Nada Libman, MD;   Location: [facility] OR;  Service: Vascular;  Laterality: N/A;  . Oophorectomy      40 years ago  . Incision and drainage abscess N/A 02/28/2015    Procedure: INCISION AND DRAINAGE ABSCESS ABDOMINAL WALL;  Surgeon: Claud Kelp, MD;  Location: [facility] OR;  Service: General;  Laterality: N/A;  . Esophagogastroduodenoscopy (egd) with propofol Left 10/09/2015    Procedure: ESOPHAGOGASTRODUODENOSCOPY (EGD) WITH PROPOFOL;  Surgeon: Willis Modena, MD;  Location: [facility] ENDOSCOPY;  Service: Endoscopy;  Laterality: Left;    Allergies: Sulfur; Sulfamethoxazole; and Tape  Medications: Prior to Admission medications   Medication Sig Start Date End Date Taking? Authorizing Provider  aspirin EC 81 MG tablet Take 81 mg by mouth daily.   Yes Historical Provider, MD  amLODipine (NORVASC) 10 MG tablet Take 10 mg by mouth daily.    Historical Provider, MD  atorvastatin (LIPITOR) 20 MG tablet Take 20 mg by mouth daily.    Historical Provider, MD  calcium carbonate (OS-CAL) 600 MG TABS tablet Take 600 mg by mouth 2 (two) times daily with a meal. Takes in the morning and at lunch time    Historical Provider, MD  [employer] STOOL SOFTENER 100 MG capsule Take 100 mg by mouth daily. 05/31/15   Historical Provider, MD  ferrous sulfate 325 (65 FE) MG tablet Take 1 tablet (325 mg total) by mouth 2 (two) times daily with a meal. 03/04/15   Catarina Hartshorn, MD  furosemide (LASIX) 20 MG tablet Take  20 mg by mouth daily.    Historical Provider, MD  gemfibrozil (LOPID) 600 MG tablet Take 600 mg by mouth 2 (two) times daily. 01/12/15   Historical Provider, MD  glipiZIDE (GLIPIZIDE XL) 2.5 MG 24 hr tablet Take 1 tablet (2.5 mg total) by mouth daily with breakfast. 04/04/15   Penny Pia, MD  KLOR-CON M20 20 MEQ tablet Take 20 mEq by mouth daily. 06/22/15   Historical Provider, MD  labetalol (NORMODYNE) 100 MG tablet Take 1 tablet (100 mg total) by mouth 2 (two) times daily. 04/04/15   Penny Pia, MD  Nutritional Supplements (JUICE PLUS FIBRE PO) Take 3  tablets by mouth daily.     Historical Provider, MD  omeprazole (PRILOSEC) 40 MG capsule Take 40 mg by mouth daily. 03/17/15   Historical Provider, MD  Polyethyl Glycol-Propyl Glycol (SYSTANE OP) Place 1 drop into both eyes 2 (two) times daily.     Historical Provider, MD  Vitamin D, Ergocalciferol, (DRISDOL) 50000 UNITS CAPS capsule Take 50,000 Units by mouth every Saturday. Reported on 05/13/2016    Historical Provider, MD     Family History  Problem Relation Age of Onset  . Heart disease Sister   . Cancer Brother   . Heart attack Mother   . Heart attack Sister   . Heart attack Brother     Social History   Social History  . Marital Status: Married    Spouse Name: N/A  . Number of Children: N/A  . Years of Education: N/A   Social History Main Topics  . Smoking status: Never Smoker   . Smokeless tobacco: Never Used     Comment: quit smoking in 1975  . Alcohol Use: No  . Drug Use: No  . Sexual Activity: Not Asked   Other Topics Concern  . None   Social History Narrative      Review of Systems  Constitutional: Positive for fatigue. Negative for activity change, appetite change and unexpected weight change.  Respiratory: Negative.   Cardiovascular: Negative.   Gastrointestinal: Negative.   Genitourinary: Negative.     Vital Signs: BP 148/70 mmHg  Pulse 63  Temp(Src) 98.2 F (36.8 C) (Oral)  Resp 14  Ht  (1.6 m)  Wt 151 lb (68.493 kg)  BMI 26.76 kg/m2  SpO2 94%  Physical Exam  Constitutional: She appears well-nourished.  Cardiovascular: Normal rate, regular rhythm and normal heart sounds.   Pulmonary/Chest: Effort normal and breath sounds normal. No respiratory distress. She has no wheezes. She exhibits no tenderness.  Abdominal: Soft. Bowel sounds are normal. She exhibits no distension. There is no tenderness.  Musculoskeletal: She exhibits no edema.    Mallampati Score:     Imaging: Ct Angio Chest Aorta W/cm &/or Wo/cm  05/06/2016  CLINICAL  DATA:  Post TEVAR 10/15. Prior open repair AAA. No pt complaints. Nonsmoker over 40 yrs.Prior bilat oophorectomy and left breast lumpectomy, benign. No hx ca. Prev in pacs. EXAM: CT ANGIOGRAPHY CHEST, ABDOMEN, PELVIS WITH CONTRAST TECHNIQUE: Multidetector CT imaging of the chest, abdomen, pelvis was performed using the standard protocol during bolus administration of intravenous contrast. Multiplanar CT image reconstructions and MIPs were obtained to evaluate the vascular anatomy. Reduced contrast dose because of low GFR and elevated creatinine. CONTRAST:  50mL Isovue 370 IV COMPARISON:  10/08/2015 and previous FINDINGS: CHEST Vascular: Right arm contrast administration. SVC patent. Incomplete opacification of right atrium, right ventricle, and pulmonary artery branches ; the exam was not optimized for detection of pulmonary emboli.  Mild left atrial enlargement. Pulmonary veins are incompletely opacified. Coronary calcifications. Scattered calcified plaque in the distal ascending aorta and arch. Classic 3 vessel brachiocephalic arterial origin anatomy without proximal stenosis. Stent graft extends from the distal arch beyond the subclavian origin to the distal descending segment. There is incomplete apposition of proximal and distal tines probably related to curvature in these regions. Persistent type 2 endoleak in the descending thoracic aortic aneurysm. The native mid descending fusiform aneurysm measures 5.9 cm maximum transverse diameter on image 51/6 (previously 5.5 cm), 6.7 cm antero posterior diameter image 82/ 6O2 (previously 6.4 cm). Mediastinum/Lymph Nodes: No masses or pathologically enlarged lymph nodes identified. No pericardial effusion. Inhomogeneous enlarged thyroid. Lungs/Pleura: Stable 11 mm noncalcified nodule in the right middle lobe (seen on scans dating back to 06/22/2010). No new pulmonary mass, infiltrate, or effusion. Musculoskeletal: No chest wall mass or suspicious bone lesions identified.  ABDOMEN Arterial Aorta: Moderate atheromatous plaque in the proximal and mid segment. Previous tube graft repair of the infrarenal segment. No dissection or stenosis. Celiac axis: High-grade origin stenosis by calcified plaque involving in length of approximately 1 cm, with marked poststenotic fusiform dilatation up to 19 mm diameter, stable. Distal branching unremarkable. 11 mm peripherally calcified aneurysm at the bifurcation of the main splenic artery. Superior mesenteric: Patent, with scattered calcified nonocclusive ostial plaque. Replaced right hepatic arterial supply, an anatomic variant. Left renal: Partially calcified ostial plaque extending over length of at least 2 cm resulting in at least mild stenosis, patent distally. Peripherally calcified branch aneurysm in the hilum measuring 10mm maximum diameter, stable. Right renal: Single, with partially calcified ostial plaque extending over length of at least 2 cm, resulting in at least mild stenosis, patent distally. Inferior mesenteric: Origin occlusion/ ligation, reconstituted distally by visceral collaterals. Left iliac: Calcified plaque throughout the common iliac without stenosis. Scattered plaque in the proximal internal and external iliac arteries without High-grade stenosis. No dissection or aneurysm. Right iliac: Mild tortuosity. Moderate calcified plaque throughout. Distal common iliac ectatic up to a 2 cm diameter. Eccentric plaque in the mid external iliac resulting in short segment stenosis of at least mild severity. No dissection. Venous Dedicated venous phase imaging not obtained. Patent bilateral renal veins, portal vein, splenic vein noted. Review of the MIP images confirms the above findings. Nonvasular Hepatobiliary: Stable 1 cm low-attenuation segment 8 lesion probably cyst but incompletely characterized. No new hepatic lesion. Gallbladder physiologically distended. Pancreas: No mass, inflammatory changes, or other significant  abnormality. Spleen: Within normal limits in size and appearance. Adrenals/Urinary Tract: 14 mm right adrenal nodule, stable since 06/22/2010. Bilateral small low-attenuation renal lesions probably cysts, incompletely characterized. No hydronephrosis. 7 mm calculus in the lower pole left renal collecting system. Urinary bladder physiologically distended. Stomach/Bowel: Stomach, small bowel, and colon nondilated. Appendix not identified. Innumerable scattered colonic diverticula predominately in ascending, descending, and sigmoid segments. Lymphatic: Subcentimeter left para-aortic and aortocaval lymph nodes. No pelvic or mesenteric adenopathy. Reproductive: Previous hysterectomy. 2.7 cm left ovarian cystic lesion, stable. Other: No ascites. No free air. Infraumbilical ventral hernia containing loops of small bowel without obstruction or strangulation. Musculoskeletal: Lumbar levoscoliosis with multilevel spondylitic changes. IMPRESSION: 1. Patent thoracic aortic stent graft with persistent type 2 endoleak and enlarging native aneurysm sac diameter 6.7 x 5.9 cm (previously 6.4 x 5.5). 2. Short-segment stenosis in the mid right external iliac artery, of possible hemodynamic significance. Correlate with clinical symptomatology and ABIs. 3. 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 4. 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 6. Left nephrolithiasis without hydronephrosis. 7. Sigmoid diverticulosis. 8. Infraumbilical ventral hernia containing small bowel without obstruction or strangulation. 9. 11 mm right middle lobe pulmonary nodule, minimally increased since 2011, presumably benign. Electronically Signed   By: Corlis Leak M.D.   On: 05/06/2016 13:49   Ct Angio Abd/pel W/ And/or W/o  05/06/2016  CLINICAL DATA:  Post TEVAR 10/15. Prior open repair AAA. No pt complaints. Nonsmoker over 40 yrs.Prior bilat oophorectomy and left breast lumpectomy, benign.  No hx ca. Prev in pacs. EXAM: CT ANGIOGRAPHY CHEST, ABDOMEN, PELVIS WITH CONTRAST TECHNIQUE: Multidetector CT imaging of the chest, abdomen, pelvis was performed using the standard protocol during bolus administration of intravenous contrast. Multiplanar CT image reconstructions and MIPs were obtained to evaluate the vascular anatomy. Reduced contrast dose because of low GFR and elevated creatinine. CONTRAST:  50mL Isovue 370 IV COMPARISON:  10/08/2015 and previous FINDINGS: CHEST Vascular: Right arm contrast administration. SVC patent. Incomplete opacification of right atrium, right ventricle, and pulmonary artery branches ; the exam was not optimized for detection of pulmonary emboli. Mild left atrial enlargement. Pulmonary veins are incompletely opacified. Coronary calcifications. Scattered calcified plaque in the distal ascending aorta and arch. Classic 3 vessel brachiocephalic arterial origin anatomy without proximal stenosis. Stent graft extends from the distal arch beyond the subclavian origin to the distal descending segment. There is incomplete apposition of proximal and distal tines probably related to curvature in these regions. Persistent type 2 endoleak in the descending thoracic aortic aneurysm. The native mid descending fusiform aneurysm measures 5.9 cm maximum transverse diameter on image 51/6 (previously 5.5 cm), 6.7 cm antero posterior diameter image 82/ 6O2 (previously 6.4 cm). Mediastinum/Lymph Nodes: No masses or pathologically enlarged lymph nodes identified. No pericardial effusion. Inhomogeneous enlarged thyroid. Lungs/Pleura: Stable 11 mm noncalcified nodule in the right middle lobe (seen on scans dating back to 06/22/2010). No new pulmonary mass, infiltrate, or effusion. Musculoskeletal: No chest wall mass or suspicious bone lesions identified. ABDOMEN Arterial Aorta: Moderate atheromatous plaque in the proximal and mid segment. Previous tube graft repair of the infrarenal segment. No  dissection or stenosis. Celiac axis: High-grade origin stenosis by calcified plaque involving in length of approximately 1 cm, with marked poststenotic fusiform dilatation up to 19 mm diameter, stable. Distal branching unremarkable. 11 mm peripherally calcified aneurysm at the bifurcation of the main splenic artery. Superior mesenteric: Patent, with scattered calcified nonocclusive ostial plaque. Replaced right hepatic arterial supply, an anatomic variant. Left renal: Partially calcified ostial plaque extending over length of at least 2 cm resulting in at least mild stenosis, patent distally. Peripherally calcified branch aneurysm in the hilum measuring 10mm maximum diameter, stable. Right renal: Single, with partially calcified ostial plaque extending over length of at least 2 cm, resulting in at least mild stenosis, patent distally. Inferior mesenteric: Origin occlusion/ ligation, reconstituted distally by visceral collaterals. Left iliac: Calcified plaque throughout the common iliac without stenosis. Scattered plaque in the proximal internal and external iliac arteries without High-grade stenosis. No dissection or aneurysm. Right iliac: Mild tortuosity. Moderate calcified plaque throughout. Distal common iliac ectatic up to a 2 cm diameter. Eccentric plaque in the mid external iliac resulting in short segment stenosis of at least mild severity. No dissection. Venous Dedicated venous phase imaging not obtained. Patent bilateral renal veins, portal vein, splenic vein noted. Review of the MIP images confirms the above findings. Nonvasular Hepatobiliary: Stable 1 cm low-attenuation segment 8 lesion probably cyst but incompletely  characterized. No new hepatic lesion. Gallbladder physiologically distended. Pancreas: No mass, inflammatory changes, or other significant abnormality. Spleen: Within normal limits in size and appearance. Adrenals/Urinary Tract: 14 mm right adrenal nodule, stable since 06/22/2010. Bilateral  small low-attenuation renal lesions probably cysts, incompletely characterized. No hydronephrosis. 7 mm calculus in the lower pole left renal collecting system. Urinary bladder physiologically distended. Stomach/Bowel: Stomach, small bowel, and colon nondilated. Appendix not identified. Innumerable scattered colonic diverticula predominately in ascending, descending, and sigmoid segments. Lymphatic: Subcentimeter left para-aortic and aortocaval lymph nodes. No pelvic or mesenteric adenopathy. Reproductive: Previous hysterectomy. 2.7 cm left ovarian cystic lesion, stable. Other: No ascites. No free air. Infraumbilical ventral hernia containing loops of small bowel without obstruction or strangulation. Musculoskeletal: Lumbar levoscoliosis with multilevel spondylitic changes. IMPRESSION: 1. Patent thoracic aortic stent graft with persistent type 2 endoleak and enlarging native aneurysm sac diameter 6.7 x 5.9 cm (previously 6.4 x 5.5). 2. Short-segment stenosis in the mid right external iliac artery, of possible hemodynamic significance. Correlate with clinical symptomatology and ABIs. 3. 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 4. 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 5. 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 6. Left nephrolithiasis without hydronephrosis. 7. Sigmoid diverticulosis. 8. Infraumbilical ventral hernia containing small bowel without obstruction or strangulation. 9. 11 mm right middle lobe pulmonary nodule, minimally increased since 2011, presumably benign. Electronically Signed   By: Corlis Leak  Hassell M.D.   On: 05/06/2016 13:49    Labs:  CBC:  Recent Labs  06/22/15 1744 10/08/15 0126  10/08/15 0536  10/09/15 0349 10/09/15 1202 10/09/15 2017 10/10/15 0404  WBC 6.3 5.9  --  6.4  --   --   --   --  6.1  HGB 9.2* 9.4*  < > 8.5*  < > 9.0* 9.3* 9.0* 9.2*  HCT 29.2* 30.6*  < > 28.2*  < > 29.1* 30.3* 29.4* 30.8*  PLT 246 199  --  167  --   --   --   --  203  < > =  values in this interval not displayed.  COAGS: No results for input(s): INR, APTT in the last 8760 hours.  BMP:  Recent Labs  10/08/15 0126 10/08/15 0536 10/09/15 0349 10/10/15 0404  NA 141 141 140 141  K 4.3 4.1 4.1 4.2  CL 108 108 108 106  CO2 23 24 23 25   GLUCOSE 119* 100* 98 104*  BUN 27* 24* 13 13  CALCIUM 9.7 9.4 9.3 9.6  CREATININE 1.41* 1.37* 1.05* 1.05*  GFRNONAA 32* 33* 46* 46*  GFRAA 37* 39* 53* 53*    LIVER FUNCTION TESTS:  Recent Labs  10/08/15 0126  BILITOT 0.4  AST 14*  ALT 9*  ALKPHOS 73  PROT 6.7  ALBUMIN 3.4*    TUMOR MARKERS: No results for input(s): AFPTM, CEA, CA199, CHROMGRNA in the last 8760 hours.  Assessment and Plan:  82 year old female with history of thoracic aortic aneurysm and endovascular repair of the thoracic aortic aneurysm in 2015. Follow-up CTA images demonstrated interval enlargement of the thoracic aneurysm sac (measuring up to 6.7 cm) with an endoleak. I reviewed the prior CT images and suspect that this is a type II endoleak originating from intercostal vessels along the medial aspect of the sac. Treatment of this type II endoleak would require percutaneous puncture of the aneurysm sac and catheter directed embolization of the sac and feeding vessel. Percutaneous puncture and embolization of this endoleak would be technically difficult based on the  configuration of the descending thoracic aorta and aneurysm sac. I think there may be a percutaneous window along the medial aspect of the aneurysm sac from paraspinal approach. Patient would require general anesthesia for this procedure. I discussed the procedure details and risks with the patient and husband in depth. The risks include bleeding, pneumothorax, anesthesia complications and unsuccessful procedure. I believe the patient has a very good understanding of the procedure risks and benefits. We did discuss the option of just following the endoleak and sac size. At this time, the  patient is very motivated to undergo treatment for this endoleak and would prefer pursuing embolization procedure rather than close observation. I explained that treatment of thoracic endoleaks is much less common thann abdominal endoleak's and our experience with these procedures is limited. I would like to review this case with my partner Dr. Ruthe Mannan who has had some experience with these procedures. After reviewing the case, I will contact the patient and we'll discuss the treatment options.  Thank you for this interesting consult.  I greatly enjoyed meeting FAY BAGG and look forward to participating in their care.  A copy of this report was sent to the requesting provider on this date.  Electronically Signed: Abundio Miu 05/21/2016, 10:37 AM   I spent a total of  30 Minutes in face to face in clinical consultation, greater than 50% of which was counseling/coordinating care for thoracic aortic endoleak.

## 2016-05-28 ENCOUNTER — Other Ambulatory Visit (HOSPITAL_COMMUNITY): Payer: Self-pay | Admitting: Diagnostic Radiology

## 2016-05-28 DIAGNOSIS — IMO0001 Reserved for inherently not codable concepts without codable children: Secondary | ICD-10-CM

## 2016-05-28 DIAGNOSIS — T82330D Leakage of aortic (bifurcation) graft (replacement), subsequent encounter: Secondary | ICD-10-CM

## 2016-06-05 ENCOUNTER — Other Ambulatory Visit: Payer: Self-pay | Admitting: General Surgery

## 2016-06-06 ENCOUNTER — Other Ambulatory Visit (HOSPITAL_COMMUNITY): Payer: Self-pay | Admitting: Diagnostic Radiology

## 2016-06-06 ENCOUNTER — Ambulatory Visit (HOSPITAL_COMMUNITY)
Admission: RE | Admit: 2016-06-06 | Discharge: 2016-06-06 | Disposition: A | Discharge status: Home / Self Care | Payer: Medicare Other | Source: Ambulatory Visit | Attending: Diagnostic Radiology | Admitting: Diagnostic Radiology

## 2016-06-06 DIAGNOSIS — I110 Hypertensive heart disease with heart failure: Secondary | ICD-10-CM | POA: Insufficient documentation

## 2016-06-06 DIAGNOSIS — Z7982 Long term (current) use of aspirin: Secondary | ICD-10-CM | POA: Insufficient documentation

## 2016-06-06 DIAGNOSIS — K219 Gastro-esophageal reflux disease without esophagitis: Secondary | ICD-10-CM | POA: Insufficient documentation

## 2016-06-06 DIAGNOSIS — M545 Low back pain: Secondary | ICD-10-CM | POA: Insufficient documentation

## 2016-06-06 DIAGNOSIS — I499 Cardiac arrhythmia, unspecified: Secondary | ICD-10-CM | POA: Insufficient documentation

## 2016-06-06 DIAGNOSIS — I509 Heart failure, unspecified: Secondary | ICD-10-CM | POA: Insufficient documentation

## 2016-06-06 DIAGNOSIS — E1151 Type 2 diabetes mellitus with diabetic peripheral angiopathy without gangrene: Secondary | ICD-10-CM | POA: Diagnosis not present

## 2016-06-06 DIAGNOSIS — IMO0001 Reserved for inherently not codable concepts without codable children: Secondary | ICD-10-CM

## 2016-06-06 DIAGNOSIS — T82330D Leakage of aortic (bifurcation) graft (replacement), subsequent encounter: Secondary | ICD-10-CM

## 2016-06-06 DIAGNOSIS — Z48812 Encounter for surgical aftercare following surgery on the circulatory system: Secondary | ICD-10-CM | POA: Insufficient documentation

## 2016-06-06 DIAGNOSIS — I712 Thoracic aortic aneurysm, without rupture: Secondary | ICD-10-CM | POA: Diagnosis not present

## 2016-06-06 LAB — CBC
HEMATOCRIT: 36.9 % (ref 36.0–46.0)
HEMOGLOBIN: 11.4 g/dL — AB (ref 12.0–15.0)
MCH: 28.7 pg (ref 26.0–34.0)
MCHC: 30.9 g/dL (ref 30.0–36.0)
MCV: 92.9 fL (ref 78.0–100.0)
Platelets: 223 10*3/uL (ref 150–400)
RBC: 3.97 MIL/uL (ref 3.87–5.11)
RDW: 15.9 % — ABNORMAL HIGH (ref 11.5–15.5)
WBC: 9.5 10*3/uL (ref 4.0–10.5)

## 2016-06-06 LAB — GLUCOSE, CAPILLARY
GLUCOSE-CAPILLARY: 89 mg/dL (ref 65–99)
GLUCOSE-CAPILLARY: 138 mg/dL — AB (ref 65–99)
Glucose-Capillary: 59 mg/dL — ABNORMAL LOW (ref 65–99)

## 2016-06-06 LAB — APTT: APTT: 28 s (ref 24–37)

## 2016-06-06 LAB — BASIC METABOLIC PANEL
ANION GAP: 8 (ref 5–15)
BUN: 28 mg/dL — ABNORMAL HIGH (ref 6–20)
CO2: 23 mmol/L (ref 22–32)
Calcium: 10.1 mg/dL (ref 8.9–10.3)
Chloride: 110 mmol/L (ref 101–111)
Creatinine, Ser: 1.44 mg/dL — ABNORMAL HIGH (ref 0.44–1.00)
GFR calc Af Amer: 36 mL/min — ABNORMAL LOW (ref >60)
GFR, EST NON AFRICAN AMERICAN: 31 mL/min — AB (ref >60)
GLUCOSE: 87 mg/dL (ref 65–99)
POTASSIUM: 4.2 mmol/L (ref 3.5–5.1)
SODIUM: 141 mmol/L (ref 135–145)

## 2016-06-06 LAB — PROTIME-INR
INR: 1.1 (ref 0.00–1.49)
Prothrombin Time: 14.4 seconds (ref 11.6–15.2)

## 2016-06-06 MED ORDER — MIDAZOLAM HCL 2 MG/2ML IJ SOLN
INTRAMUSCULAR | Status: AC | PRN
  Administered 2016-06-06 (×2): 0.5 mg via INTRAVENOUS

## 2016-06-06 MED ORDER — SODIUM BICARBONATE BOLUS VIA INFUSION
INTRAVENOUS | Status: AC
  Administered 2016-06-06: 12:00:00 via INTRAVENOUS
  Filled 2016-06-06: qty 1

## 2016-06-06 MED ORDER — IOPAMIDOL (ISOVUE-300) INJECTION 61%
200.0000 mL | Freq: Once | INTRAVENOUS | Status: AC | PRN
  Administered 2016-06-06: 75 mL via INTRAVENOUS

## 2016-06-06 MED ORDER — ONDANSETRON HCL 4 MG/2ML IJ SOLN: 4.0000 mg | Freq: Once | INTRAMUSCULAR | Status: DC

## 2016-06-06 MED ORDER — SODIUM CHLORIDE 0.9 % IV SOLN
INTRAVENOUS | Status: DC
  Administered 2016-06-06: 11:00:00 via INTRAVENOUS

## 2016-06-06 MED ORDER — ACETAMINOPHEN 325 MG PO TABS: 650.0000 mg | ORAL_TABLET | Freq: Once | ORAL | Status: DC

## 2016-06-06 MED ORDER — FENTANYL CITRATE (PF) 100 MCG/2ML IJ SOLN
INTRAMUSCULAR | Status: AC | PRN
  Administered 2016-06-06 (×2): 12.5 ug via INTRAVENOUS

## 2016-06-06 MED ORDER — SODIUM BICARBONATE 8.4 % IV SOLN
INTRAVENOUS | Status: AC
  Administered 2016-06-06: 15:00:00 via INTRAVENOUS
  Filled 2016-06-06: qty 500

## 2016-06-06 NOTE — Sedation Documentation (Signed)
Patient is resting comfortably. 

## 2016-06-06 NOTE — Sedation Documentation (Signed)
Pt wants 02 off- sats wnl R AIR

## 2016-06-06 NOTE — Sedation Documentation (Addendum)
Patient asking for more meds-

## 2016-06-06 NOTE — Procedures (Addendum)
Post-Procedure Note  Pre-operative Diagnosis: Thoracic aortic graft endoleak       Post-operative Diagnosis: Thoracic aortic graft endoleak, but not visualized on this study   Indications: Enlarging thoracic aortic aneurysm with endoleak.  Procedure Details:   Informed consent obtained.  Tried to access aorta via right CFA but unable to advance wire proximal to right external iliac artery.  Access obtained in left groin.  Pigtail catheter placed in aortic arch and within graft for aortograms.  Findings: Patent thoracic stent graft.   See final dictated report in PACS / Imaging.  Contrast total: 75 ml  Complications: none     Condition: Stable  Plan: Bedrest 4 hours. Discharge home.

## 2016-06-06 NOTE — Progress Notes (Signed)
Patient ID: Jennifer MusselJanie B [name], female   DOB: 01/05/1925, 82 y.o.   MRN: [id number]    Referring Physician(s): Brabham,v  Supervising Physician: Richarda OverlieHenn, Adam  Patient Status:  Outpatient  Chief Complaint: endoleak of thoracic aortic aneurysm   Subjective: Patient familiar to IR service from recent consultation with Dr. 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 See consult note for additional details. She presents today for diagnostic arteriography prior to catheter directed embolization. She currently denies fever, headache, chest pain, dyspnea, cough, abdominal pain, nausea, vomiting or abnormal bleeding. She does have intermittent mid to low back pain. Past Medical History  Diagnosis Date  . Diabetes mellitus without complication (HCC)   . Hypertension   . AAA (abdominal aortic aneurysm)-repaired   . Irregular heart beat   . Acute bronchitis   . Tachycardia     Unspecified  . Shortness of breath dyspnea   . GERD (gastroesophageal reflux disease)   . Anemia   . CHF (congestive heart failure) [facility](HCC)    Past Surgical History  Procedure Laterality Date  . Abdominal hysterectomy      40 years ago, intestinal damage  . Abdominal aortic aneurysm repair  2012    Dr. Arbie CookeyEarly  . Breast lumpectomy Left     Benign Mass  . Thoracic aortic endovascular stent graft N/A 09/27/2014    Procedure: THORACIC AORTIC ENDOVASCULAR STENT GRAFT;  Surgeon: Nada LibmanVance W Brabham, MD;  [facility];  Service: Vascular;  Laterality: N/A;  . Oophorectomy      40 years ago  . Incision and drainage abscess N/A 02/28/2015    Procedure: INCISION AND DRAINAGE ABSCESS ABDOMINAL WALL;  Surgeon: Claud KelpHaywood Ingram, MD;  Location: [facility] OR;  Service: General;  Laterality: N/A;  . Esophagogastroduodenoscopy (egd) with propofol Left 10/09/2015    Procedure: ESOPHAGOGASTRODUODENOSCOPY (EGD) WITH PROPOFOL;  Surgeon: Willis ModenaWilliam Outlaw, MD;  [facility];   Service: Endoscopy;  Laterality: Left;      Allergies: Sulfur; Sulfamethoxazole; and Tape  Medications: Prior to Admission medications   Medication Sig Start Date End Date Taking? Authorizing Provider  amLODipine (NORVASC) 10 MG tablet Take 10 mg by mouth daily.    Historical Provider, MD  aspirin EC 81 MG tablet Take 81 mg by mouth daily.    Historical Provider, MD  atorvastatin (LIPITOR) 20 MG tablet Take 20 mg by mouth daily.    Historical Provider, MD  calcium carbonate (OS-CAL) 600 MG TABS tablet Take 600 mg by mouth 2 (two) times daily with a meal. Takes in the morning and at lunch time    Historical Provider, MD  [employer] STOOL SOFTENER 100 MG capsule Take 100 mg by mouth daily. 05/31/15   Historical Provider, MD  ferrous sulfate 325 (65 FE) MG tablet Take 1 tablet (325 mg total) by mouth 2 (two) times daily with a meal. 03/04/15   Catarina Hartshornavid Tat, MD  furosemide (LASIX) 20 MG tablet Take 20 mg by mouth daily.    Historical Provider, MD  gemfibrozil (LOPID) 600 MG tablet Take 600 mg by mouth 2 (two) times daily. 01/12/15   Historical Provider, MD  glipiZIDE (GLIPIZIDE XL) 2.5 MG 24 hr tablet Take 1 tablet (2.5 mg total) by mouth daily with breakfast. 04/04/15   Penny Piarlando Vega, MD  KLOR-CON M20 20 MEQ tablet Take 20 mEq by mouth daily. 06/22/15   Historical Provider, MD  labetalol (NORMODYNE) 100 MG tablet Take 1 tablet (100 mg total) by mouth  2 (two) times daily. 04/04/15   Penny Piarlando Vega, MD  Nutritional Supplements (JUICE PLUS FIBRE PO) Take 3 tablets by mouth daily.     Historical Provider, MD  omeprazole (PRILOSEC) 40 MG capsule Take 40 mg by mouth daily. 03/17/15   Historical Provider, MD  Polyethyl Glycol-Propyl Glycol (SYSTANE OP) Place 1 drop into both eyes 2 (two) times daily.     Historical Provider, MD     Vital Signs: BP 155/98 mmHg  Pulse 65  Temp(Src) 97.6 F (36.4 C) (Oral)  Resp 16  Ht 5\' 3"  (1.6 m)  Wt 151 lb (68.493 kg)  BMI 26.76 kg/m2  SpO2 96%  Physical Exam patient awake,  alert. Chest clear to auscultation bilaterally. Heart with regular rate and rhythm. Abdomen soft, positive bowel sounds, ventral hernia present,NT; trace bilateral lower extremity edema.  Imaging: No results found.  Labs:  CBC:  Recent Labs  10/08/15 0126  10/08/15 0536  10/09/15 1202 10/09/15 2017 10/10/15 0404 06/06/16 1030  WBC 5.9  --  6.4  --   --   --  6.1 9.5  HGB 9.4*  < > 8.5*  < > 9.3* 9.0* 9.2* 11.4*  HCT 30.6*  < > 28.2*  < > 30.3* 29.4* 30.8* 36.9  PLT 199  --  167  --   --   --  203 223  < > = values in this interval not displayed.  COAGS:  Recent Labs  06/06/16 1030  INR 1.10  APTT 28    BMP:  Recent Labs  10/08/15 0126 10/08/15 0536 10/09/15 0349 10/10/15 0404  NA 141 141 140 141  K 4.3 4.1 4.1 4.2  CL 108 108 108 106  CO2 23 24 23 25   GLUCOSE 119* 100* 98 104*  BUN 27* 24* 13 13  CALCIUM 9.7 9.4 9.3 9.6  CREATININE 1.41* 1.37* 1.05* 1.05*  GFRNONAA 32* 33* 46* 46*  GFRAA 37* 39* 53* 53*    LIVER FUNCTION TESTS:  Recent Labs  10/08/15 0126  BILITOT 0.4  AST 14*  ALT 9*  ALKPHOS 73  PROT 6.7  ALBUMIN 3.4*    Assessment and Plan: 82 year old female with history of thoracic aortic aneurysm and endovascular repair in 2015. Abdominal aortic aneurysm repair in 2012. Seen in consult by Dr. Lowella DandyHenn on 05/21/16 for thoracic aortic aneurysm endoleak treatment options. Follow-up CTA images demonstrated interval enlargement of the thoracic aneurysm sac (measuring up to 6.7 cm) with an endoleak. This is most likely a type II endoleak originating from intercostal vessels along the medial aspect of the sac. Treatment of this type II endoleak would require percutaneous puncture of the aneurysm sac and catheter directed embolization of the sac and feeding vessel. Percutaneous puncture and embolization of this endoleak would be technically difficult based on the configuration of the descending thoracic aorta and aneurysm sac. There may be a percutaneous  window along the medial aspect of the aneurysm sac from paraspinal approach. Patient would require general anesthesia for this procedure. She presents today for diagnostic arteriography prior to future endoleak embolization. Details/risks of procedure, including not limited to, internal bleeding, infection, contrast nephropathy, TIA/stroke/peripheral embolization discussed with patient and husband with their understanding and consent. BUN 28, creat 1.44, GFR 31 today. Dr. Lowella DandyHenn aware.  Electronically Signed: D. Jeananne RamaKevin Alayia Meggison 06/06/2016, 11:01 AM   I spent a total of 20 minutes at the the patient's bedside AND on the patient's hospital floor or unit, greater than 50% of which was counseling/coordinating  care for thoracic/abdominal aortography with branch vessel arteriography

## 2016-06-06 NOTE — Sedation Documentation (Signed)
Exoseal placed to L groin, IR tech holding pressure

## 2016-06-06 NOTE — Progress Notes (Signed)
Called MD and asked for PRN orders for tylenol and Zofran.

## 2016-06-06 NOTE — Discharge Instructions (Signed)
Angiogram, Care After °Refer to this sheet in the next few weeks. These instructions provide you with information about caring for yourself after your procedure. Your health care provider may also give you more specific instructions. Your treatment has been planned according to current medical practices, but problems sometimes occur. Call your health care provider if you have any problems or questions after your procedure. °WHAT TO EXPECT AFTER THE PROCEDURE °After your procedure, it is typical to have the following: °· Bruising at the catheter insertion site that usually fades within 1-2 weeks. °· Blood collecting in the tissue (hematoma) that may be painful to the touch. It should usually decrease in size and tenderness within 1-2 weeks. °HOME CARE INSTRUCTIONS °· Take medicines only as directed by your health care provider. °· You may shower 24-48 hours after the procedure or as directed by your health care provider. Remove the bandage (dressing) and gently wash the site with plain soap and water. Pat the area dry with a clean towel. Do not rub the site, because this may cause bleeding. °· Do not take baths, swim, or use a hot tub until your health care provider approves. °· Check your insertion site every day for redness, swelling, or drainage. °· Do not apply powder or lotion to the site. °· Do not lift over 10 lb (4.5 kg) for 5 days after your procedure or as directed by your health care provider. °· Ask your health care provider when it is okay to: °¨ Return to work or school. °¨ Resume usual physical activities or sports. °¨ Resume sexual activity. °· Do not drive home if you are discharged the same day as the procedure. Have someone else drive you. °· You may drive 24 hours after the procedure unless otherwise instructed by your health care provider. °· Do not operate machinery or power tools for 24 hours after the procedure or as directed by your health care provider. °· If your procedure was done as an  outpatient procedure, which means that you went home the same day as your procedure, a responsible adult should be with you for the first 24 hours after you arrive home. °· Keep all follow-up visits as directed by your health care provider. This is important. °SEEK MEDICAL CARE IF: °· You have a fever. °· You have chills. °· You have increased bleeding from the catheter insertion site. Hold pressure on the site.  CALL 911 °SEEK IMMEDIATE MEDICAL CARE IF: °· You have unusual pain at the catheter insertion site. °· You have redness, warmth, or swelling at the catheter insertion site. °· You have drainage (other than a small amount of blood on the dressing) from the catheter insertion site. °· The catheter insertion site is bleeding, and the bleeding does not stop after 30 minutes of holding steady pressure on the site. °· The area near or just beyond the catheter insertion site becomes pale, cool, tingly, or numb. °  °This information is not intended to replace advice given to you by your health care provider. Make sure you discuss any questions you have with your health care provider. °  °Document Released: 06/06/2005 Document Revised: 12/09/2014 Document Reviewed: 04/21/2013 °[employer]vier Interactive Patient Education ©2016 [employer] ° °

## 2016-06-06 NOTE — Sedation Documentation (Signed)
CBG- 59, pt has been shaky in last few minutes, 25mg  D50 given per protocol

## 2016-06-06 NOTE — Sedation Documentation (Signed)
Recheck CBG 138

## 2016-06-12 ENCOUNTER — Other Ambulatory Visit (HOSPITAL_COMMUNITY): Payer: Self-pay | Admitting: Diagnostic Radiology

## 2016-06-12 DIAGNOSIS — I711 Thoracic aortic aneurysm, ruptured, unspecified: Secondary | ICD-10-CM

## 2016-06-13 ENCOUNTER — Other Ambulatory Visit: Payer: Self-pay | Admitting: Radiology

## 2016-06-17 ENCOUNTER — Other Ambulatory Visit (HOSPITAL_COMMUNITY): Payer: Self-pay | Admitting: Diagnostic Radiology

## 2016-06-17 ENCOUNTER — Ambulatory Visit (HOSPITAL_COMMUNITY)
Admission: RE | Admit: 2016-06-17 | Discharge: 2016-06-17 | Disposition: A | Discharge status: Home / Self Care | Payer: Medicare Other | Source: Ambulatory Visit | Attending: Diagnostic Radiology | Admitting: Diagnostic Radiology

## 2016-06-17 ENCOUNTER — Encounter (HOSPITAL_COMMUNITY): Payer: Self-pay

## 2016-06-17 DIAGNOSIS — I711 Thoracic aortic aneurysm, ruptured, unspecified: Secondary | ICD-10-CM

## 2016-06-17 DIAGNOSIS — I712 Thoracic aortic aneurysm, without rupture: Secondary | ICD-10-CM | POA: Insufficient documentation

## 2016-06-17 DIAGNOSIS — I110 Hypertensive heart disease with heart failure: Secondary | ICD-10-CM | POA: Insufficient documentation

## 2016-06-17 DIAGNOSIS — E119 Type 2 diabetes mellitus without complications: Secondary | ICD-10-CM | POA: Diagnosis not present

## 2016-06-17 DIAGNOSIS — Z48812 Encounter for surgical aftercare following surgery on the circulatory system: Secondary | ICD-10-CM | POA: Diagnosis present

## 2016-06-17 DIAGNOSIS — I509 Heart failure, unspecified: Secondary | ICD-10-CM | POA: Diagnosis not present

## 2016-06-17 DIAGNOSIS — K219 Gastro-esophageal reflux disease without esophagitis: Secondary | ICD-10-CM | POA: Diagnosis not present

## 2016-06-17 DIAGNOSIS — Z7982 Long term (current) use of aspirin: Secondary | ICD-10-CM | POA: Diagnosis not present

## 2016-06-17 DIAGNOSIS — Z8249 Family history of ischemic heart disease and other diseases of the circulatory system: Secondary | ICD-10-CM | POA: Insufficient documentation

## 2016-06-17 LAB — BASIC METABOLIC PANEL
ANION GAP: 7 (ref 5–15)
BUN: 23 mg/dL — ABNORMAL HIGH (ref 6–20)
CHLORIDE: 109 mmol/L (ref 101–111)
CO2: 24 mmol/L (ref 22–32)
CREATININE: 1.41 mg/dL — AB (ref 0.44–1.00)
Calcium: 9.8 mg/dL (ref 8.9–10.3)
GFR calc non Af Amer: 32 mL/min — ABNORMAL LOW (ref >60)
GFR, EST AFRICAN AMERICAN: 37 mL/min — AB (ref >60)
Glucose, Bld: 96 mg/dL (ref 65–99)
Potassium: 4.1 mmol/L (ref 3.5–5.1)
SODIUM: 140 mmol/L (ref 135–145)

## 2016-06-17 LAB — GLUCOSE, CAPILLARY
Glucose-Capillary: 93 mg/dL (ref 65–99)
Glucose-Capillary: 136 mg/dL — ABNORMAL HIGH (ref 65–99)

## 2016-06-17 LAB — CBC
HCT: 35.4 % — ABNORMAL LOW (ref 36.0–46.0)
HEMOGLOBIN: 10.7 g/dL — AB (ref 12.0–15.0)
MCH: 28.2 pg (ref 26.0–34.0)
MCHC: 30.2 g/dL (ref 30.0–36.0)
MCV: 93.4 fL (ref 78.0–100.0)
PLATELETS: 251 10*3/uL (ref 150–400)
RBC: 3.79 MIL/uL — AB (ref 3.87–5.11)
RDW: 16 % — AB (ref 11.5–15.5)
WBC: 7.8 10*3/uL (ref 4.0–10.5)

## 2016-06-17 LAB — PROTIME-INR
INR: 1.04 (ref 0.00–1.49)
PROTHROMBIN TIME: 13.8 s (ref 11.6–15.2)

## 2016-06-17 LAB — APTT: aPTT: 29 seconds (ref 24–37)

## 2016-06-17 MED ORDER — LIDOCAINE HCL 1 % IJ SOLN
INTRAMUSCULAR | Status: AC
  Filled 2016-06-17: qty 20

## 2016-06-17 MED ORDER — SODIUM CHLORIDE 0.9 % IV SOLN: INTRAVENOUS | Status: DC

## 2016-06-17 MED ORDER — HYDRALAZINE HCL 20 MG/ML IJ SOLN
INTRAMUSCULAR | Status: AC
  Filled 2016-06-17: qty 1

## 2016-06-17 MED ORDER — NITROGLYCERIN 1 MG/10 ML FOR IR/CATH LAB
INTRA_ARTERIAL | Status: AC | PRN
  Administered 2016-06-17: 200 ug via INTRA_ARTERIAL

## 2016-06-17 MED ORDER — HYDRALAZINE HCL 20 MG/ML IJ SOLN
10.0000 mg | Freq: Once | INTRAMUSCULAR | Status: AC
  Administered 2016-06-17: 10 mg via INTRAVENOUS

## 2016-06-17 MED ORDER — IOPAMIDOL (ISOVUE-300) INJECTION 61%
INTRAVENOUS | Status: AC
  Administered 2016-06-17: 50 mL
  Filled 2016-06-17: qty 50

## 2016-06-17 MED ORDER — HEPARIN SODIUM (PORCINE) 1000 UNIT/ML IJ SOLN
INTRAMUSCULAR | Status: AC | PRN
  Administered 2016-06-17: 2000 [IU] via INTRA_ARTERIAL

## 2016-06-17 MED ORDER — ACETYLCYSTEINE 10% NICU INHALATION SOLUTION
2.0000 mL | Freq: Once | RESPIRATORY_TRACT | Status: DC
  Filled 2016-06-17: qty 2

## 2016-06-17 MED ORDER — FENTANYL CITRATE (PF) 100 MCG/2ML IJ SOLN
INTRAMUSCULAR | Status: AC
  Filled 2016-06-17: qty 4

## 2016-06-17 MED ORDER — VITAMIN C 500 MG PO TABS
1000.0000 mg | ORAL_TABLET | Freq: Once | ORAL | Status: DC
  Filled 2016-06-17: qty 2

## 2016-06-17 MED ORDER — HEPARIN SODIUM (PORCINE) 1000 UNIT/ML IJ SOLN
INTRAMUSCULAR | Status: AC
  Filled 2016-06-17: qty 1

## 2016-06-17 MED ORDER — FENTANYL CITRATE (PF) 100 MCG/2ML IJ SOLN
INTRAMUSCULAR | Status: AC | PRN
  Administered 2016-06-17 (×2): 25 ug via INTRAVENOUS

## 2016-06-17 MED ORDER — MIDAZOLAM HCL 2 MG/2ML IJ SOLN
INTRAMUSCULAR | Status: AC | PRN
  Administered 2016-06-17: 0.5 mg via INTRAVENOUS
  Administered 2016-06-17: 1 mg via INTRAVENOUS

## 2016-06-17 MED ORDER — VERAPAMIL HCL 2.5 MG/ML IV SOLN
INTRAVENOUS | Status: AC | PRN
  Administered 2016-06-17: 2.5 mg via INTRA_ARTERIAL

## 2016-06-17 MED ORDER — MIDAZOLAM HCL 2 MG/2ML IJ SOLN
INTRAMUSCULAR | Status: AC
  Filled 2016-06-17: qty 4

## 2016-06-17 MED ORDER — SODIUM CHLORIDE 0.9 % IV SOLN: INTRAVENOUS | Status: AC

## 2016-06-17 MED ORDER — IOPAMIDOL (ISOVUE-300) INJECTION 61%
INTRAVENOUS | Status: AC
  Administered 2016-06-17: 70 mL
  Filled 2016-06-17: qty 100

## 2016-06-17 MED ORDER — NITROGLYCERIN 1 MG/10 ML FOR IR/CATH LAB
INTRA_ARTERIAL | Status: AC
  Filled 2016-06-17: qty 10

## 2016-06-17 MED ORDER — VERAPAMIL HCL 2.5 MG/ML IV SOLN
INTRAVENOUS | Status: AC
  Filled 2016-06-17: qty 2

## 2016-06-17 MED ORDER — SODIUM CHLORIDE 0.9 % IV SOLN
INTRAVENOUS | Status: AC | PRN
  Administered 2016-06-17: 10 mL/h via INTRAVENOUS

## 2016-06-17 MED ORDER — IOPAMIDOL (ISOVUE-300) INJECTION 61%: 100.0000 mL | Freq: Once | INTRAVENOUS | Status: DC | PRN

## 2016-06-17 NOTE — Sedation Documentation (Signed)
Patient is resting comfortably. 

## 2016-06-17 NOTE — H&P (Signed)
Chief Complaint: Patient was seen in consultation today for repeat thoracic angiogram at the request of Dr. Coral Else  Referring Physician(s): Dr. Coral Else  Supervising Physician: Gilmer Mor  Patient Status: Outpatient  History of Present Illness: Jennifer [name] is a 82 y.o. female familiar to IR service from recent consultation with Dr. Lowella Dandy on 05/21/16 to discuss treatment options for a suspected type II endoleak of known thoracic aortic aneurysm. See consult note for additional details. She presents today for repeat diagnostic arteriography prior to catheter directed embolization  Past Medical History  Diagnosis Date  . Diabetes mellitus without complication (HCC)   . Hypertension   . AAA (abdominal aortic aneurysm)-repaired   . Irregular heart beat   . Acute bronchitis   . Tachycardia     Unspecified  . Shortness of breath dyspnea   . GERD (gastroesophageal reflux disease)   . Anemia   . CHF (congestive heart failure) [facility])     Past Surgical History  Procedure Laterality Date  . Abdominal hysterectomy      40 years ago, intestinal damage  . Abdominal aortic aneurysm repair  2012    Dr. Arbie Cookey  . Breast lumpectomy Left     Benign Mass  . Thoracic aortic endovascular stent graft N/A 09/27/2014    Procedure: THORACIC AORTIC ENDOVASCULAR STENT GRAFT;  Surgeon: Nada Libman, MD;  Location: [facility] OR;  Service: Vascular;  Laterality: N/A;  . Oophorectomy      40 years ago  . Incision and drainage abscess N/A 02/28/2015    Procedure: INCISION AND DRAINAGE ABSCESS ABDOMINAL WALL;  Surgeon: Claud Kelp, MD;  Location: [facility] OR;  Service: General;  Laterality: N/A;  . Esophagogastroduodenoscopy (egd) with propofol Left 10/09/2015    Procedure: ESOPHAGOGASTRODUODENOSCOPY (EGD) WITH PROPOFOL;  Surgeon: Willis Modena, MD;  Location: [facility] ENDOSCOPY;  Service: Endoscopy;  Laterality: Left;    Allergies: Sulfur; Sulfamethoxazole; and Tape  Medications: Prior to  Admission medications   Medication Sig Start Date End Date Taking? Authorizing Provider  amLODipine (NORVASC) 10 MG tablet Take 10 mg by mouth daily.   Yes Historical Provider, MD  aspirin EC 81 MG tablet Take 81 mg by mouth daily.   Yes Historical Provider, MD  atorvastatin (LIPITOR) 20 MG tablet Take 20 mg by mouth daily.   Yes Historical Provider, MD  calcium carbonate (OS-CAL) 600 MG TABS tablet Take 600 mg by mouth 2 (two) times daily with a meal. Takes in the morning and at lunch time   Yes Historical Provider, MD  ferrous sulfate 325 (65 FE) MG tablet Take 1 tablet (325 mg total) by mouth 2 (two) times daily with a meal. 03/04/15  Yes Catarina Hartshorn, MD  furosemide (LASIX) 20 MG tablet Take 20 mg by mouth daily.   Yes Historical Provider, MD  gemfibrozil (LOPID) 600 MG tablet Take 600 mg by mouth 2 (two) times daily. 01/12/15  Yes Historical Provider, MD  glipiZIDE (GLIPIZIDE XL) 2.5 MG 24 hr tablet Take 1 tablet (2.5 mg total) by mouth daily with breakfast. 04/04/15  Yes Penny Pia, MD  KLOR-CON M20 20 MEQ tablet Take 20 mEq by mouth daily. 06/22/15  Yes Historical Provider, MD  labetalol (NORMODYNE) 100 MG tablet Take 1 tablet (100 mg total) by mouth 2 (two) times daily. 04/04/15  Yes Penny Pia, MD  Nutritional Supplements (JUICE PLUS FIBRE PO) Take 3 tablets by mouth daily.    Yes Historical Provider, MD  omeprazole (PRILOSEC) 40 MG capsule Take 40  mg by mouth daily. 03/17/15  Yes Historical Provider, MD  Polyethyl Glycol-Propyl Glycol (SYSTANE OP) Place 1 drop into both eyes 2 (two) times daily.    Yes Historical Provider, MD  [employer] STOOL SOFTENER 100 MG capsule Take 100 mg by mouth daily. 05/31/15   Historical Provider, MD     Family History  Problem Relation Age of Onset  . Heart disease Sister   . Cancer Brother   . Heart attack Mother   . Heart attack Sister   . Heart attack Brother     Social History   Social History  . Marital Status: Married    Spouse Name: N/A  . Number of  Children: N/A  . Years of Education: N/A   Social History Main Topics  . Smoking status: Never Smoker   . Smokeless tobacco: Never Used     Comment: quit smoking in 1975  . Alcohol Use: No  . Drug Use: No  . Sexual Activity: Not Asked   Other Topics Concern  . None   Social History Narrative     Review of Systems: A 12 point ROS discussed and pertinent positives are indicated in the HPI above.  All other systems are negative.  Review of Systems  Vital Signs: BP 188/85 mmHg  Pulse 66  Temp(Src) 97.8 F (36.6 C) (Oral)  Resp 18  Ht 5\' 3"  (1.6 m)  Wt 151 lb (68.493 kg)  BMI 26.76 kg/m2  SpO2 100%  Physical Exam  Constitutional: She appears well-developed and well-nourished. No distress.  HENT:  Head: Normocephalic.  Mouth/Throat: Oropharynx is clear and moist.  Cardiovascular: Normal rate, regular rhythm, normal heart sounds and intact distal pulses.   Pulmonary/Chest: Effort normal and breath sounds normal. No respiratory distress.  Neurological: She is alert.  Psychiatric: She has a normal mood and affect. Judgment normal.    Mallampati Score:  MD Evaluation Airway: WNL Heart: WNL Abdomen: WNL Chest/ Lungs: WNL ASA  Classification: 3 Mallampati/Airway Score: Two  Imaging: Ir Aorta/thoracic  06/06/2016  INDICATION: 82 year old with a thoracic aortic aneurysm and status post endovascular repair. Enlarging thoracic aortic aneurysm sac within endoleak. Plan for thoracic aortogram to better characterize this endoleak. EXAM: THORACIC AORTOGRAPHY; IR ULTRASOUND GUIDANCE VASC ACCESS LEFT MEDICATIONS: None ANESTHESIA/SEDATION: Moderate (conscious) sedation was employed during this procedure. A total of Versed 1.0 mg and Fentanyl 25 mcg was administered intravenously. Moderate Sedation Time: 60 minutes. The patient's level of consciousness and vital signs were monitored continuously by radiology nursing throughout the procedure under my direct supervision. CONTRAST:  75mL  ISOVUE-300 IOPAMIDOL (ISOVUE-300) INJECTION 61% FLUOROSCOPY TIME:  Fluoroscopy Time: 8 minutes 30 seconds (669 mGy). COMPLICATIONS: None immediate. PROCEDURE: Informed consent was obtained from the patient following explanation of the procedure, risks, benefits and alternatives. The patient understands, agrees and consents for the procedure. All questions were addressed. A time out was performed prior to the initiation of the procedure. Maximal barrier sterile technique utilized including caps, mask, sterile gowns, sterile gloves, large sterile drape, hand hygiene, and Betadine prep. Due to patient's low GFR, the patient was hydrated and given sodium bicarbonate before and after the procedure. Contrast was also diluted to 75% strength in order to decrease the contrast dose during the procedure. Patient was placed supine on the interventional table. Right groin was prepped and draped in a sterile fashion. Right common femoral artery was identified with ultrasound. Skin was anesthetized with 1% lidocaine. 21 gauge needle directed into right common femoral artery with ultrasound guidance. A  wire would not advance into the proximal right external iliac artery. Small amount of contrast was injected and there was concern for stenosis in this area after reviewing the prior CTA images. This needle was removed with manual compression. Left groin was prepped and draped in sterile fashion. Skin was anesthetized with 1% lidocaine. Using ultrasound guidance, left common femoral artery was punctured with a 21 gauge needle. Wire was advanced into the left iliac arteries without complication and a micropuncture dilator set was placed. A 5 French vascular sheath was placed. A 5 French Kumpe catheter was advanced into the thoracic aorta using a Bentson wire. A Kumpe catheter was exchanged for a 5 French pigtail catheter. The pigtail catheter was positioned in the aortic arch. Biplane digital subtraction angiography was obtained of  the thoracic aorta. Catheter was pulled into the proximal aspect of the thoracic stent graft. Additional biplane angiography was performed. Pigtail catheter was removed over a Bentson wire. Contrast was injected through the left groin sheath. The left groin sheath was removed with an Exoseal closure device. There was hemostasis in left groin at the end the procedure. Fluoroscopic and ultrasound images were taken and saved for documentation. FINDINGS: Right groin: Wire would not advance into the proximal right external iliac artery. Review of prior CTA demonstrates a stenosis in this area. Thoracic aorta: Aortic stent graft is positioned in the distal aortic arch, distal to left subclavian artery. Overlying grafts in the distal descending thoracic aorta. The graft terminates near the aortic hiatus. Right innominate artery, left common carotid artery and left subclavian artery are patent. The thoracic stent graft is widely patent. There may be an endoleak along the medial aspect of the proximal graft on the initial AP images. There is not an obvious type 3 endoleak along the anterior aspect of the proximal graft on the lateral view. IMPRESSION: Patent thoracic aortic stent graft. Question visualization of the endoleak in the proximal descending thoracic aortic region. However, this study does not confidently diagnosis the type of endoleak. Electronically Signed   By: Richarda Overlie M.D.   On: 06/06/2016 15:59   Ir US Guide Vasc Access Left  06/06/2016  INDICATION: 82 year old with a thoracic aortic aneurysm and status post endovascular repair. Enlarging thoracic aortic aneurysm sac within endoleak. Plan for thoracic aortogram to better characterize this endoleak. EXAM: THORACIC AORTOGRAPHY; IR ULTRASOUND GUIDANCE VASC ACCESS LEFT MEDICATIONS: None ANESTHESIA/SEDATION: Moderate (conscious) sedation was employed during this procedure. A total of Versed 1.0 mg and Fentanyl 25 mcg was administered intravenously. Moderate  Sedation Time: 60 minutes. The patient's level of consciousness and vital signs were monitored continuously by radiology nursing throughout the procedure under my direct supervision. CONTRAST:  75mL ISOVUE-300 IOPAMIDOL (ISOVUE-300) INJECTION 61% FLUOROSCOPY TIME:  Fluoroscopy Time: 8 minutes 30 seconds (669 mGy). COMPLICATIONS: None immediate. PROCEDURE: Informed consent was obtained from the patient following explanation of the procedure, risks, benefits and alternatives. The patient understands, agrees and consents for the procedure. All questions were addressed. A time out was performed prior to the initiation of the procedure. Maximal barrier sterile technique utilized including caps, mask, sterile gowns, sterile gloves, large sterile drape, hand hygiene, and Betadine prep. Due to patient's low GFR, the patient was hydrated and given sodium bicarbonate before and after the procedure. Contrast was also diluted to 75% strength in order to decrease the contrast dose during the procedure. Patient was placed supine on the interventional table. Right groin was prepped and draped in a sterile fashion. Right common femoral artery  was identified with ultrasound. Skin was anesthetized with 1% lidocaine. 21 gauge needle directed into right common femoral artery with ultrasound guidance. A wire would not advance into the proximal right external iliac artery. Small amount of contrast was injected and there was concern for stenosis in this area after reviewing the prior CTA images. This needle was removed with manual compression. Left groin was prepped and draped in sterile fashion. Skin was anesthetized with 1% lidocaine. Using ultrasound guidance, left common femoral artery was punctured with a 21 gauge needle. Wire was advanced into the left iliac arteries without complication and a micropuncture dilator set was placed. A 5 French vascular sheath was placed. A 5 French Kumpe catheter was advanced into the thoracic aorta  using a Bentson wire. A Kumpe catheter was exchanged for a 5 French pigtail catheter. The pigtail catheter was positioned in the aortic arch. Biplane digital subtraction angiography was obtained of the thoracic aorta. Catheter was pulled into the proximal aspect of the thoracic stent graft. Additional biplane angiography was performed. Pigtail catheter was removed over a Bentson wire. Contrast was injected through the left groin sheath. The left groin sheath was removed with an Exoseal closure device. There was hemostasis in left groin at the end the procedure. Fluoroscopic and ultrasound images were taken and saved for documentation. FINDINGS: Right groin: Wire would not advance into the proximal right external iliac artery. Review of prior CTA demonstrates a stenosis in this area. Thoracic aorta: Aortic stent graft is positioned in the distal aortic arch, distal to left subclavian artery. Overlying grafts in the distal descending thoracic aorta. The graft terminates near the aortic hiatus. Right innominate artery, left common carotid artery and left subclavian artery are patent. The thoracic stent graft is widely patent. There may be an endoleak along the medial aspect of the proximal graft on the initial AP images. There is not an obvious type 3 endoleak along the anterior aspect of the proximal graft on the lateral view. IMPRESSION: Patent thoracic aortic stent graft. Question visualization of the endoleak in the proximal descending thoracic aortic region. However, this study does not confidently diagnosis the type of endoleak. Electronically Signed   By: Richarda Overlie M.D.   On: 06/06/2016 15:59    Labs:  CBC:  Recent Labs  10/08/15 0536  10/09/15 2017 10/10/15 0404 06/06/16 1030 06/17/16 0700  WBC 6.4  --   --  6.1 9.5 7.8  HGB 8.5*  < > 9.0* 9.2* 11.4* 10.7*  HCT 28.2*  < > 29.4* 30.8* 36.9 35.4*  PLT 167  --   --  203 223 251  < > = values in this interval not displayed.  COAGS:  Recent  Labs  06/06/16 1030 06/17/16 0700  INR 1.10 1.04  APTT 28 29    BMP:  Recent Labs  10/09/15 0349 10/10/15 0404 06/06/16 1030 06/17/16 0700  NA 140 141 141 140  K 4.1 4.2 4.2 4.1  CL 108 106 110 109  CO2 23 25 23 24   GLUCOSE 98 104* 87 96  BUN 13 13 28* 23*  CALCIUM 9.3 9.6 10.1 9.8  CREATININE 1.05* 1.05* 1.44* 1.41*  GFRNONAA 46* 46* 31* 32*  GFRAA 53* 53* 36* 37*    LIVER FUNCTION TESTS:  Recent Labs  10/08/15 0126  BILITOT 0.4  AST 14*  ALT 9*  ALKPHOS 73  PROT 6.7  ALBUMIN 3.4*    TUMOR MARKERS: No results for input(s): AFPTM, CEA, CA199, CHROMGRNA in the last 8760  hours.  Assessment and Plan: Thoracic Endograft leak. For repeat angio today Labs ok, renal fxn stable Risks and Benefits discussed with the patient including, but not limited to bleeding, infection, vascular injury or contrast induced renal failure. All of the patient's questions were answered, patient is agreeable to proceed. Consent signed and in chart.   Thank you for this interesting consult.  I greatly enjoyed meeting Gershon MusselJanie B Noren and look forward to participating in their care.  A copy of this report was sent to the requesting provider on this date.  Electronically Signed: Brayton ElBRUNING, Oriya Kettering 06/17/2016, 8:11 AM   I spent a total of 20 minutes in face to face in clinical consultation, greater than 50% of which was counseling/coordinating care for thoracic angiogram

## 2016-06-17 NOTE — Discharge Instructions (Signed)
Radial Site Care °Refer to this sheet in the next few weeks. These instructions provide you with information about caring for yourself after your procedure. Your health care provider may also give you more specific instructions. Your treatment has been planned according to current medical practices, but problems sometimes occur. Call your health care provider if you have any problems or questions after your procedure. °WHAT TO EXPECT AFTER THE PROCEDURE °After your procedure, it is typical to have the following: °· Bruising at the radial site that usually fades within 1-2 weeks. °· Blood collecting in the tissue (hematoma) that may be painful to the touch. It should usually decrease in size and tenderness within 1-2 weeks. °HOME CARE INSTRUCTIONS °· Take medicines only as directed by your health care provider. °· You may shower 24-48 hours after the procedure or as directed by your health care provider. Remove the bandage (dressing) and gently wash the site with plain soap and water. Pat the area dry with a clean towel. Do not rub the site, because this may cause bleeding. °· Do not take baths, swim, or use a hot tub until your health care provider approves. °· Check your insertion site every day for redness, swelling, or drainage. °· Do not apply powder or lotion to the site. °· Do not flex or bend the affected arm for 24 hours or as directed by your health care provider. °· Do not push or pull heavy objects with the affected arm for 24 hours or as directed by your health care provider. °· Do not lift over 10 lb (4.5 kg) for 5 days after your procedure or as directed by your health care provider. °· Ask your health care provider when it is okay to: °¨ Return to work or school. °¨ Resume usual physical activities or sports. °¨ Resume sexual activity. °· Do not drive home if you are discharged the same day as the procedure. Have someone else drive you. °· You may drive 24 hours after the procedure unless otherwise  instructed by your health care provider. °· Do not operate machinery or power tools for 24 hours after the procedure. °· If your procedure was done as an outpatient procedure, which means that you went home the same day as your procedure, a responsible adult should be with you for the first 24 hours after you arrive home. °· Keep all follow-up visits as directed by your health care provider. This is important. °SEEK MEDICAL CARE IF: °· You have a fever. °· You have chills. °· You have increased bleeding from the radial site. Hold pressure on the site. °SEEK IMMEDIATE MEDICAL CARE IF: °· You have unusual pain at the radial site. °· You have redness, warmth, or swelling at the radial site. °· You have drainage (other than a small amount of blood on the dressing) from the radial site. °· The radial site is bleeding, and the bleeding does not stop after 30 minutes of holding steady pressure on the site. °· Your arm or hand becomes pale, cool, tingly, or numb. °  °This information is not intended to replace advice given to you by your health care provider. Make sure you discuss any questions you have with your health care provider. °  °Document Released: 12/21/2010 Document Revised: 12/09/2014 Document Reviewed: 06/06/2014 °Elsevier Interactive Patient Education ©2016 [employer] ° °

## 2016-06-17 NOTE — Progress Notes (Signed)
No further chills noted

## 2016-06-17 NOTE — Procedures (Signed)
Interventional Radiology Procedure Note  Procedure: Left radial artery access for left UE angiogram and selective angiogram of lateral thoracic artery and costo-cervical trunk.     Application of TR band for hemostasis.   .  Complications: None  Recommendations:  - Ok to shower tomorrow - TR Band orders entered in nursing care set - Do not submerge for 7 days - Routine wound care - IV hydration for renal protection - Mucomyst dose - vit c dose   Signed,  Yvone NeuJaime S. Loreta AveWagner, DO

## 2016-06-17 NOTE — Sedation Documentation (Addendum)
IV removed from left wrist to accomodate TR band. Left radial pulse 2+ pre procedure. TR band applied and 12cc in TR band. Sheath removed per Dr Loreta AveWagner.

## 2016-06-17 NOTE — Sedation Documentation (Signed)
Patient denies pain and is resting comfortably. TR band in place with 12 cc of air. Left radial 3+, Left hadn pink and warm with cap refill <3 seconds. Good sensation and movement to left hand with no bleeding at puncture site, level zero.

## 2016-06-17 NOTE — Progress Notes (Signed)
Dr Ardelle AntonWagoner notified of client with shaking chills and no other complaints and per Dr Ardelle AntonWagoner cbg done

## 2016-06-17 NOTE — Sedation Documentation (Signed)
Patient denies pain and is resting comfortably.  

## 2016-06-25 ENCOUNTER — Telehealth: Payer: Self-pay | Admitting: Diagnostic Radiology

## 2016-06-25 NOTE — Progress Notes (Signed)
I have reviewed the patient's recent arteriogram and discussed with Dr. Myra Gianotti.  We have not been able to localize the thoracic endoleak source with angiography.  I was considering a MRA examination but after discussing the protocol with the tech, I don't think we will be able to get additional information with our contrast limitations based on patient's GFR.  At this time, my recommendation is to closely follow the endoleak or seek a second opinion.  I discussed this with the patient's husband.  He is going to discuss with Mrs. Livesey and they will let us know how they want to proceed.

## 2016-11-18 ENCOUNTER — Ambulatory Visit: Payer: Medicare Other | Admitting: Surgery

## 2016-11-22 ENCOUNTER — Encounter: Payer: Self-pay | Admitting: Surgery

## 2016-12-09 ENCOUNTER — Ambulatory Visit: Payer: Medicare Other | Admitting: Surgery

## 2017-01-03 ENCOUNTER — Encounter: Payer: Self-pay | Admitting: Surgery

## 2017-01-08 ENCOUNTER — Encounter: Payer: Self-pay | Admitting: Surgery

## 2017-01-08 ENCOUNTER — Ambulatory Visit (INDEPENDENT_AMBULATORY_CARE_PROVIDER_SITE_OTHER): Payer: Self-pay | Admitting: Surgery

## 2017-01-08 VITALS — BP 167/81 | HR 64 | Temp 97.7°F | Resp 18 | Ht 63.0 in | Wt 151.6 lb

## 2017-01-08 DIAGNOSIS — I712 Thoracic aortic aneurysm, without rupture, unspecified: Secondary | ICD-10-CM

## 2017-01-08 NOTE — Addendum Note (Signed)
Addended by: Burton ApleyPETTY, Adian Jablonowski A on: 01/08/2017 11:56 AM   Modules accepted: Orders

## 2017-01-08 NOTE — Progress Notes (Signed)
The patient was supposed to come with a CT scan today, however there was a mix up and this was not done.  I have her scheduled for follow-up in 1-2 weeks.  Today will be a no charge visit.

## 2017-01-13 ENCOUNTER — Ambulatory Visit
Admission: RE | Admit: 2017-01-13 | Discharge: 2017-01-13 | Disposition: A | Discharge status: Home / Self Care | Payer: Medicare Other | Source: Ambulatory Visit | Attending: Surgery | Admitting: Surgery

## 2017-01-13 ENCOUNTER — Other Ambulatory Visit: Payer: Self-pay | Admitting: Surgery

## 2017-01-13 DIAGNOSIS — I712 Thoracic aortic aneurysm, without rupture, unspecified: Secondary | ICD-10-CM

## 2017-01-20 ENCOUNTER — Ambulatory Visit
Admission: RE | Admit: 2017-01-20 | Discharge: 2017-01-20 | Disposition: A | Discharge status: Home / Self Care | Payer: Medicare Other | Source: Ambulatory Visit | Attending: Surgery | Admitting: Surgery

## 2017-01-20 DIAGNOSIS — I712 Thoracic aortic aneurysm, without rupture, unspecified: Secondary | ICD-10-CM

## 2017-01-20 MED ORDER — IOPAMIDOL (ISOVUE-370) INJECTION 76%
60.0000 mL | Freq: Once | INTRAVENOUS | Status: AC | PRN
  Administered 2017-01-20: 60 mL via INTRAVENOUS

## 2017-01-21 ENCOUNTER — Encounter: Payer: Self-pay | Admitting: Surgery

## 2017-01-27 ENCOUNTER — Encounter: Payer: Self-pay | Admitting: Surgery

## 2017-01-27 ENCOUNTER — Ambulatory Visit (INDEPENDENT_AMBULATORY_CARE_PROVIDER_SITE_OTHER): Payer: Medicare Other | Admitting: Surgery

## 2017-01-27 VITALS — BP 150/70 | HR 69 | Temp 98.0°F | Resp 20 | Ht 63.0 in | Wt 152.0 lb

## 2017-01-27 DIAGNOSIS — I712 Thoracic aortic aneurysm, without rupture, unspecified: Secondary | ICD-10-CM

## 2017-01-27 NOTE — Progress Notes (Signed)
[facility]  Patient name: Jennifer MusselJanie B [name] MRN: [id number] DOB: 02/20/1925 Sex: female   REASON FOR VISIT:    Follow up TAAA  HISOTRY OF PRESENT ILLNESS:   Jennifer MusselJanie B [name] is a 83 y.o. female who initially presented with left-sided chest pain. 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 She was treated medically for this and discharged to home with follow-up in my office. When I evaluated her, she was having persistent back pain which had gotten worse on the day that I saw her. I repeated her CT scan which did not show any significant changes. She was scheduled for expedited endovascular repair of her aneurysm. On 09/27/2014 she underwent endovascular repair of her aneurysm using a Gore CTAG device. She had a distal extension placed as well. This was done through left common femoral artery access. Following her procedure she did have persistent pain. A CT scan was obtained which showed that the stent graft was in good position and that the aneurysm was completely excluded. She did have a new finding of a subclavian artery dissection as well as narrowing of her left distal external / common femoral artery.   We have been following her for a persistent endoleak with an increase in size of the native aneurysm sac.  She was referred to interventional radiology who performed angiography and was unable to locate the endoleak.  He is back today with a CT scan for follow-up.  PAST MEDICAL HISTORY:   Past Medical History:  Diagnosis Date  . AAA (abdominal aortic aneurysm)-repaired   . Acute bronchitis   . Anemia   . CHF (congestive heart failure) (HCC)   . Diabetes mellitus without complication (HCC)   . GERD (gastroesophageal reflux disease)   . Hypertension   . Irregular heart beat   . Shortness of breath dyspnea   . Tachycardia    Unspecified     FAMILY  HISTORY:   Family History  Problem Relation Age of Onset  . Heart disease Sister   . Heart attack Mother   . Cancer Brother   . Heart attack Sister   . Heart attack Brother     SOCIAL HISTORY:   Social History  Substance Use Topics  . Smoking status: Former Smoker    Quit date: 1975  . Smokeless tobacco: Never Used     Comment: quit smoking in 1975  . Alcohol use No     ALLERGIES:   Allergies  Allergen Reactions  . Sulfur     Unknown reaction   . Sulfamethoxazole Rash  . Tape Rash and Other (See Comments)    "paper" tape please, adhesive tape "burns'     CURRENT MEDICATIONS:   Current Outpatient Prescriptions  Medication Sig Dispense Refill  . amLODipine (NORVASC) 10 MG tablet Take 10 mg by mouth daily.    Marland Kitchen. aspirin EC 81 MG tablet Take 81 mg by mouth daily.    Marland Kitchen. atorvastatin (LIPITOR) 20 MG tablet Take 20 mg by mouth daily.    . calcium carbonate (OS-CAL) 600 MG TABS tablet Take 600 mg by mouth 2 (two) times daily with a meal. Takes in the morning and at lunch time    . [employer] STOOL SOFTENER 100 MG capsule Take 100 mg by mouth daily.  5  . ferrous sulfate 325 (65 FE) MG tablet Take 1 tablet (325 mg total) by mouth 2 (two) times daily with a meal. 60  tablet 0  . furosemide (LASIX) 20 MG tablet Take 20 mg by mouth daily.    Marland Kitchen gemfibrozil (LOPID) 600 MG tablet Take 600 mg by mouth 2 (two) times daily.  5  . glipiZIDE (GLIPIZIDE XL) 2.5 MG 24 hr tablet Take 1 tablet (2.5 mg total) by mouth daily with breakfast. 30 tablet 0  . KLOR-CON M20 20 MEQ tablet Take 20 mEq by mouth daily.  2  . labetalol (NORMODYNE) 100 MG tablet Take 1 tablet (100 mg total) by mouth 2 (two) times daily. 60 tablet 0  . Nutritional Supplements (JUICE PLUS FIBRE PO) Take 3 tablets by mouth daily.     Marland Kitchen omeprazole (PRILOSEC) 40 MG capsule Take 40 mg by mouth daily.  2  . Polyethyl Glycol-Propyl Glycol (SYSTANE OP) Place 1 drop into both eyes 2 (two) times daily.      No current  facility-administered medications for this visit.     REVIEW OF SYSTEMS:   [X]  denotes positive finding, [ ]  denotes negative finding Cardiac  Comments:  Chest pain or chest pressure:    Shortness of breath upon exertion:    Short of breath when lying flat:    Irregular heart rhythm:        Vascular    Pain in calf, thigh, or hip brought on by ambulation:    Pain in feet at night that wakes you up from your sleep:     Blood clot in your veins:    Leg swelling:         Pulmonary    Oxygen at home:    Productive cough:     Wheezing:         Neurologic    Sudden weakness in arms or legs:     Sudden numbness in arms or legs:     Sudden onset of difficulty speaking or slurred speech:    Temporary loss of vision in one eye:     Problems with dizziness:         Gastrointestinal    Blood in stool:     Vomited blood:         Genitourinary    Burning when urinating:     Blood in urine:        Psychiatric    Major depression:         Hematologic    Bleeding problems:    Problems with blood clotting too easily:        Skin    Rashes or ulcers:        Constitutional    Fever or chills:      PHYSICAL EXAM:   Vitals:   01/27/17 1408  BP: (!) 150/70  Pulse: 69  Resp: 20  Temp: 98 F (36.7 C)  TempSrc: Oral  SpO2: 92%  Weight: 152 lb (68.9 kg)  Height: 5\' 3"  (1.6 m)    GENERAL: The patient is a well-nourished female, in no acute distress. The vital signs are documented above. CARDIAC: There is a regular rate and rhythm.  PULMONARY: Non-labored respirations MUSCULOSKELETAL: There are no major deformities or cyanosis. NEUROLOGIC: No focal weakness or paresthesias are detected. SKIN: There are no ulcers or rashes noted. PSYCHIATRIC: The patient has a normal affect.  STUDIES:  Reviewed her CT scan with the following findings: 1. Stable thoracic aortic stent graft with persistent endoleak, and continued increase in native aneurysm sac diameter to 6.1 x 6.9  cm (previously 5.9 x 6.7). 2. 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 3. Stable 1 cm right middle lobe pulmonary nodule.  MEDICAL ISSUES:   TAAA:  The patient continues to have a persistent endoleak with an enlarging aneurysm sac.  We have been unable to identify the leak on angiography.  Therefore, after discussing this with Dr. Lowella Dandy, I am going to refer the patient to [facility] for further evaluation.  I'm going to make a copy of the CT scan and arteriogram consented to [facility] and then I will call the patient with further details.    Durene Cal, MD Vascular and Vein Specialists of [facility] [phone] Pager [phone]

## 2017-03-14 ENCOUNTER — Telehealth: Payer: Self-pay | Admitting: Surgery

## 2017-03-14 NOTE — Telephone Encounter (Signed)
Per Dr.Brabham's staff message, I faxed a referral to Dr.Charles Laurell Josephs with [facility] Interventional Radiology Dept with office notes, demographics, etc to fax# [phone] Attn:Cathy. Phone #[phone] She stated they would contact the patient regarding an appointment. I also arranged for a CD of her recent CTA to be made at [facility] [address] location. The patient is to pick this up prior to her appointment with Dr.Charles Laurell Josephs and take that CD with her to his office. I called the patient and her husband and gave them all the above information.  awt

## 2017-03-14 NOTE — Telephone Encounter (Signed)
-----   Message from Nada Libman, MD sent at 03/13/2017  9:44 PM EDT ----- Regarding: RE: [facility] referral Contact: [phone] I am not sure what happened.  Can we send her to see Dr. Carmie End at [facility] with interventional radiology.  I have discussed this with him and he is aware of the patient.  We will need to get a copy of her most recent CTA sent to [facility].  Can you please arrange this referral.  We can call [place] imaging to put her CT on a CD so the patient can take it with her.  Thank you so much for assisting with this ----- Message ----- From: Shari Prows Sent: 03/13/2017   4:15 PM To: Nada Libman, MD Subject: [facility] referral                                   Dr.Brabham, This patient called today regarding the referral to [facility] vascular physicians. She saw you as a new patient on 01/27/17 and I did see the mention of the referral in your office note from that date. She stated she hasn't received a phone call from them as of yet. Did you send the information to them or should I begin that process? The referral was noted on the blue discharge sheet from that date. Please advise. Thanks, Drinda Butts

## 2017-03-19 ENCOUNTER — Other Ambulatory Visit: Payer: Self-pay | Admitting: Internal Medicine

## 2017-03-19 ENCOUNTER — Ambulatory Visit
Admission: RE | Admit: 2017-03-19 | Discharge: 2017-03-19 | Disposition: A | Discharge status: Home / Self Care | Payer: Medicare Other | Source: Ambulatory Visit | Attending: Internal Medicine | Admitting: Internal Medicine

## 2017-03-19 DIAGNOSIS — M25551 Pain in right hip: Secondary | ICD-10-CM

## 2017-03-31 ENCOUNTER — Telehealth: Payer: Self-pay | Admitting: Surgery

## 2017-03-31 NOTE — Telephone Encounter (Signed)
Returned patient's husband phone call from this morning (answering service) regarding the referral from Dr.Brabham to Dr.Charles Laurell Josephs w/ [facility] in [place]. I contacted their office at ph# [phone] and the patient is scheduled to see Dr.Burke on Wed 04/02/17 at 10:30am.  I called the home phone # left w/ the answering service and it was a busy so I called the cell phone # and spoke to [place], the patient's husband. He stated his wife was physically unable to make the trip to [facility] for the appointment on 04/02/17. He stated he wanted his son to take the CD of the CTA to their office, I instructed him to ask Dr.Burke's office when he called if they should drive that far just to deliver a CD. It may not be necessary if they aren't going to see the patient on 04/02/17 as scheduled. I gave him the office phone # listed above for him to call and cancel/or reschedule this appointment for his wife. He voiced understanding. awt

## 2017-04-14 ENCOUNTER — Other Ambulatory Visit (HOSPITAL_COMMUNITY): Payer: Self-pay | Admitting: Diagnostic Radiology

## 2017-04-14 DIAGNOSIS — I712 Thoracic aortic aneurysm, without rupture, unspecified: Secondary | ICD-10-CM

## 2017-04-21 NOTE — Addendum Note (Signed)
Addended by: Burton ApleyPETTY, Nevin Kozuch A on: 04/21/2017 02:53 PM   Modules accepted: Orders

## 2017-04-24 ENCOUNTER — Ambulatory Visit
Admission: RE | Admit: 2017-04-24 | Discharge: 2017-04-24 | Disposition: A | Discharge status: Home / Self Care | Payer: Medicare Other | Source: Ambulatory Visit | Attending: Diagnostic Radiology | Admitting: Diagnostic Radiology

## 2017-04-24 DIAGNOSIS — I712 Thoracic aortic aneurysm, without rupture, unspecified: Secondary | ICD-10-CM

## 2017-04-24 HISTORY — PX: IR RADIOLOGIST EVAL & MGMT: IMG5224

## 2017-04-24 NOTE — Progress Notes (Signed)
Referring Physician(s): Dr. Coral ElseVance Brabham  Chief Complaint: The patient is seen in follow up today s/p endoleak of thoracic aortic aneurysm s/p thoracic angiogram 06/17/2016  History of present illness: Jennifer [name] is a 83 y.o. female familiar to IR service from ongoing assessment and discussion related to a persistent type II endoleak of known thoracic aortic aneurysm.   Most recent thoracic angiogram was completed 06/17/17.  At that time, no evidence of type 2 endoleak of descending thoracic aortic aneurysm was observed.   Repeat CTA Chest Aorta in 01/20/17 showed: 1. Stable thoracic aortic stent graft with persistent endoleak, and continued increase in native aneurysm sac diameter to 6.1 x 6.9 cm (previously 5.9 x 6.7). 2. 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 3. Stable 1 cm right middle lobe pulmonary nodule.  After discussion between Dr. Myra GianottiBrabham and Dr. Lowella DandyHenn, it was decided patient should be referred to [facility] for further evaluation and opinion. Unfortunately, the patient has not yet been able to make the trip [facility].  For this reason, she and her husband requested follow-up visit with Dr. Lowella DandyHenn to re-establish care plan.   Patient and her husband present to clinic today.  Her husband is concerned that there has been considerable time between images and appointments.  Jennifer [name] has been in good health with the exception of a muscle sprain in her thigh which prevented [facility]. Husband states they have resched[facility] due to unforseen circumstances and [facility] has been gracious with rescheduling appointments.  She is otherwise doing well and denies new symptoms or complaints.     Past Medical History:  Diagnosis Date  . AAA (abdominal aortic aneurysm)-repaired   . Acute bronchitis   . Anemia   . CHF (congestive heart failure) (HCC)   . Diabetes mellitus without  complication (HCC)   . GERD (gastroesophageal reflux disease)   . Hypertension   . Irregular heart beat   . Shortness of breath dyspnea   . Tachycardia    Unspecified    Past Surgical History:  Procedure Laterality Date  . ABDOMINAL AORTIC ANEURYSM REPAIR  2012   Dr. Arbie CookeyEarly  . ABDOMINAL HYSTERECTOMY     40 years ago, intestinal damage  . BREAST LUMPECTOMY Left    Benign Mass  . ESOPHAGOGASTRODUODENOSCOPY (EGD) WITH PROPOFOL Left 10/09/2015   Procedure: ESOPHAGOGASTRODUODENOSCOPY (EGD) WITH PROPOFOL;  Surgeon: Willis ModenaWilliam Outlaw, MD;  [facility];  Service: Endoscopy;  Laterality: Left;  . INCISION AND DRAINAGE ABSCESS N/A 02/28/2015   Procedure: INCISION AND DRAINAGE ABSCESS ABDOMINAL WALL;  Surgeon: Claud KelpHaywood Ingram, MD;  Location: [facility] OR;  Service: General;  Laterality: N/A;  . OOPHORECTOMY     40 years ago  . THORACIC AORTIC ENDOVASCULAR STENT GRAFT N/A 09/27/2014   Procedure: THORACIC AORTIC ENDOVASCULAR STENT GRAFT;  Surgeon: Nada LibmanVance W Brabham, MD;  Location: [facility] OR;  Service: Vascular;  Laterality: N/A;    Allergies: Sulfur; Amoxicillin; Sulfamethoxazole; and Tape  Medications: Prior to Admission medications   Medication Sig Start Date End Date Taking? Authorizing Provider  amLODipine (NORVASC) 10 MG tablet Take 10 mg by mouth daily.   Yes [provider]  aspirin EC 81 MG tablet Take 81 mg by mouth daily.   Yes [provider]  atorvastatin (LIPITOR) 20 MG tablet Take 20 mg by mouth daily.   Yes [provider]  calcium carbonate (OS-CAL) 600 MG TABS tablet Take  600 mg by mouth 2 (two) times daily with a meal. Takes in the morning and at lunch time   Yes [provider]  CVS STOOL SOFTENER 100 MG capsule Take 100 mg by mouth daily. 05/31/15  Yes [provider]  ferrous sulfate 325 (65 FE) MG tablet Take 1 tablet (325 mg total) by mouth 2 (two) times daily with a meal. 03/04/15  Yes Tat, David, MD  furosemide (LASIX) 20 MG tablet  Take 20 mg by mouth daily.   Yes [provider]  gemfibrozil (LOPID) 600 MG tablet Take 600 mg by mouth 2 (two) times daily. 01/12/15  Yes [provider]  glimepiride (AMARYL) 2 MG tablet Take 2 mg by mouth daily with breakfast.   Yes [provider]  KLOR-CON M20 20 MEQ tablet Take 20 mEq by mouth daily. 06/22/15  Yes [provider]  labetalol (NORMODYNE) 100 MG tablet Take 1 tablet (100 mg total) by mouth 2 (two) times daily. 04/04/15  Yes Penny Pia, MD  Multiple Vitamin (MULTIVITAMIN) tablet Take 1 tablet by mouth daily.   Yes [provider]  Nutritional Supplements (JUICE PLUS FIBRE PO) Take 3 tablets by mouth daily.    Yes [provider]  omeprazole (PRILOSEC) 40 MG capsule Take 40 mg by mouth daily. 03/17/15  Yes [provider]  Polyethyl Glycol-Propyl Glycol (SYSTANE OP) Place 1 drop into both eyes 2 (two) times daily.    Yes [provider]  glipiZIDE (GLIPIZIDE XL) 2.5 MG 24 hr tablet Take 1 tablet (2.5 mg total) by mouth daily with breakfast. Patient not taking: Reported on 04/24/2017 04/04/15   Penny Pia, MD     Family History  Problem Relation Age of Onset  . Heart disease Sister   . Heart attack Mother   . Cancer Brother   . Heart attack Sister   . Heart attack Brother     Social History   Social History  . Marital status: Married    Spouse name: N/A  . Number of children: N/A  . Years of education: N/A   Social History Main Topics  . Smoking status: Former Smoker    Quit date: 1975  . Smokeless tobacco: Never Used     Comment: quit smoking in 1975  . Alcohol use No  . Drug use: No  . Sexual activity: Not on file   Other Topics Concern  . Not on file   Social History Narrative  . No narrative on file     Vital Signs: BP (!) 157/77   Pulse 80   Temp 98.5 F (36.9 C) (Oral)   Resp 15   Ht 5\' 3"  (1.6 m)   Wt 146 lb (66.2 kg)   SpO2 95%   BMI 25.86 kg/m   Physical Exam    Constitutional: She is oriented to person, place, and time. She appears well-developed.  Cardiovascular: Normal rate, regular rhythm and normal heart sounds.   Pulmonary/Chest: Effort normal and breath sounds normal. No respiratory distress.  Musculoskeletal: She exhibits no edema or deformity.  Neurological: She is alert and oriented to person, place, and time.  Skin: Skin is warm and dry.  No cyanosis.   Nursing note and vitals reviewed.   Imaging: No results found.  Labs:  CBC:  Recent Labs  06/06/16 1030 06/17/16 0700  WBC 9.5 7.8  HGB 11.4* 10.7*  HCT 36.9 35.4*  PLT 223 251    COAGS:  Recent Labs  06/06/16 1030 06/17/16  0700  INR 1.10 1.04  APTT 28 29    BMP:  Recent Labs  06/06/16 1030 06/17/16 0700  NA 141 140  K 4.2 4.1  CL 110 109  CO2 23 24  GLUCOSE 87 96  BUN 28* 23*  CALCIUM 10.1 9.8  CREATININE 1.44* 1.41*  GFRNONAA 31* 32*  GFRAA 36* 37*    LIVER FUNCTION TESTS: No results for input(s): BILITOT, AST, ALT, ALKPHOS, PROT, ALBUMIN in the last 8760 hours.  Assessment: Jennifer [name] is a 83 y.o. female familiar to IR service from ongoing assessment and discussion related to a persistent type II endoleak of known thoracic aortic aneurysm.  Patient has been referred to [facility] for further discussion and opinion.  She has an appointment with Dr. Laurell Josephs on Wednesday May 30th.  Husband and patient present today for follow-up and to confirm care plan.  They inquire about the need for new imaging or repeat angiogram prior to appointment.  Dr. Lowella Dandy present for discussion and recommends that patient proceed with appointment without further delay. Additional imaging may be required, but should be done per the request of Dr. Laurell Josephs and his team.  Patient and husband are agreeable to care plan and state their appreciation of medical teams working to ensure best care. Patient has arranged transportation with her son and is planning to make her appointment next  week.   Signed: Hoyt Koch, PA 04/24/2017, 11:51 AM   Please refer to Dr. Lowella Dandy attestation of this note for management and plan.

## 2017-05-16 ENCOUNTER — Encounter: Payer: Self-pay | Admitting: Diagnostic Radiology

## 2017-09-29 ENCOUNTER — Telehealth: Payer: Self-pay | Admitting: *Deleted

## 2017-09-29 NOTE — Telephone Encounter (Signed)
Patient called stated that he "needed to get scan before December due to insurance" . Patient is now under the care of [place] Gave patient's spouse phone number for this office.

## 2017-12-09 ENCOUNTER — Ambulatory Visit
Admission: RE | Admit: 2017-12-09 | Discharge: 2017-12-09 | Disposition: A | Discharge status: Home / Self Care | Payer: Medicare Other | Source: Ambulatory Visit | Attending: Internal Medicine | Admitting: Internal Medicine

## 2017-12-09 ENCOUNTER — Other Ambulatory Visit: Payer: Self-pay | Admitting: Internal Medicine

## 2017-12-09 DIAGNOSIS — W19XXXA Unspecified fall, initial encounter: Secondary | ICD-10-CM | POA: Diagnosis not present

## 2017-12-09 DIAGNOSIS — M79601 Pain in right arm: Secondary | ICD-10-CM

## 2017-12-09 DIAGNOSIS — I712 Thoracic aortic aneurysm, without rupture: Secondary | ICD-10-CM | POA: Diagnosis not present

## 2017-12-09 DIAGNOSIS — I10 Essential (primary) hypertension: Secondary | ICD-10-CM | POA: Diagnosis not present

## 2017-12-09 DIAGNOSIS — M19011 Primary osteoarthritis, right shoulder: Secondary | ICD-10-CM | POA: Diagnosis not present

## 2017-12-09 DIAGNOSIS — S46811S Strain of other muscles, fascia and tendons at shoulder and upper arm level, right arm, sequela: Secondary | ICD-10-CM | POA: Diagnosis not present

## 2017-12-23 DIAGNOSIS — S46811D Strain of other muscles, fascia and tendons at shoulder and upper arm level, right arm, subsequent encounter: Secondary | ICD-10-CM | POA: Diagnosis not present

## 2017-12-23 DIAGNOSIS — E1151 Type 2 diabetes mellitus with diabetic peripheral angiopathy without gangrene: Secondary | ICD-10-CM | POA: Diagnosis not present

## 2017-12-23 DIAGNOSIS — I10 Essential (primary) hypertension: Secondary | ICD-10-CM | POA: Diagnosis not present

## 2018-01-12 DIAGNOSIS — I712 Thoracic aortic aneurysm, without rupture: Secondary | ICD-10-CM | POA: Diagnosis not present

## 2018-01-12 DIAGNOSIS — M79601 Pain in right arm: Secondary | ICD-10-CM | POA: Diagnosis not present

## 2018-01-21 DIAGNOSIS — I712 Thoracic aortic aneurysm, without rupture: Secondary | ICD-10-CM | POA: Diagnosis not present

## 2018-02-04 DIAGNOSIS — M79672 Pain in left foot: Secondary | ICD-10-CM | POA: Diagnosis not present

## 2018-02-04 DIAGNOSIS — M79671 Pain in right foot: Secondary | ICD-10-CM | POA: Diagnosis not present

## 2018-02-04 DIAGNOSIS — L602 Onychogryphosis: Secondary | ICD-10-CM | POA: Diagnosis not present

## 2018-02-04 DIAGNOSIS — L84 Corns and callosities: Secondary | ICD-10-CM | POA: Diagnosis not present

## 2018-02-04 DIAGNOSIS — E119 Type 2 diabetes mellitus without complications: Secondary | ICD-10-CM | POA: Diagnosis not present

## 2018-02-05 DIAGNOSIS — H524 Presbyopia: Secondary | ICD-10-CM | POA: Diagnosis not present

## 2018-02-09 DIAGNOSIS — G8929 Other chronic pain: Secondary | ICD-10-CM | POA: Diagnosis not present

## 2018-02-09 DIAGNOSIS — R102 Pelvic and perineal pain: Secondary | ICD-10-CM | POA: Diagnosis not present

## 2018-02-09 DIAGNOSIS — N3020 Other chronic cystitis without hematuria: Secondary | ICD-10-CM | POA: Diagnosis not present

## 2018-04-10 DIAGNOSIS — M79672 Pain in left foot: Secondary | ICD-10-CM | POA: Diagnosis not present

## 2018-04-10 DIAGNOSIS — L602 Onychogryphosis: Secondary | ICD-10-CM | POA: Diagnosis not present

## 2018-04-10 DIAGNOSIS — M79671 Pain in right foot: Secondary | ICD-10-CM | POA: Diagnosis not present

## 2018-04-10 DIAGNOSIS — L84 Corns and callosities: Secondary | ICD-10-CM | POA: Diagnosis not present

## 2018-04-10 DIAGNOSIS — E119 Type 2 diabetes mellitus without complications: Secondary | ICD-10-CM | POA: Diagnosis not present

## 2018-04-15 DIAGNOSIS — Z1389 Encounter for screening for other disorder: Secondary | ICD-10-CM | POA: Diagnosis not present

## 2018-04-15 DIAGNOSIS — E1122 Type 2 diabetes mellitus with diabetic chronic kidney disease: Secondary | ICD-10-CM | POA: Diagnosis not present

## 2018-04-15 DIAGNOSIS — Z136 Encounter for screening for cardiovascular disorders: Secondary | ICD-10-CM | POA: Diagnosis not present

## 2018-04-15 DIAGNOSIS — I10 Essential (primary) hypertension: Secondary | ICD-10-CM | POA: Diagnosis not present

## 2018-04-15 DIAGNOSIS — E559 Vitamin D deficiency, unspecified: Secondary | ICD-10-CM | POA: Diagnosis not present

## 2018-04-15 DIAGNOSIS — I5030 Unspecified diastolic (congestive) heart failure: Secondary | ICD-10-CM | POA: Diagnosis not present

## 2018-04-15 DIAGNOSIS — E1121 Type 2 diabetes mellitus with diabetic nephropathy: Secondary | ICD-10-CM | POA: Diagnosis not present

## 2018-04-15 DIAGNOSIS — I130 Hypertensive heart and chronic kidney disease with heart failure and stage 1 through stage 4 chronic kidney disease, or unspecified chronic kidney disease: Secondary | ICD-10-CM | POA: Diagnosis not present

## 2018-04-15 DIAGNOSIS — N183 Chronic kidney disease, stage 3 (moderate): Secondary | ICD-10-CM | POA: Diagnosis not present

## 2018-04-15 DIAGNOSIS — D500 Iron deficiency anemia secondary to blood loss (chronic): Secondary | ICD-10-CM | POA: Diagnosis not present

## 2018-04-15 DIAGNOSIS — Z0000 Encounter for general adult medical examination without abnormal findings: Secondary | ICD-10-CM | POA: Diagnosis not present

## 2018-04-15 DIAGNOSIS — N319 Neuromuscular dysfunction of bladder, unspecified: Secondary | ICD-10-CM | POA: Diagnosis not present

## 2018-06-15 DIAGNOSIS — M79672 Pain in left foot: Secondary | ICD-10-CM | POA: Diagnosis not present

## 2018-06-15 DIAGNOSIS — M79671 Pain in right foot: Secondary | ICD-10-CM | POA: Diagnosis not present

## 2018-06-15 DIAGNOSIS — L602 Onychogryphosis: Secondary | ICD-10-CM | POA: Diagnosis not present

## 2018-06-15 DIAGNOSIS — L84 Corns and callosities: Secondary | ICD-10-CM | POA: Diagnosis not present

## 2018-06-15 DIAGNOSIS — E119 Type 2 diabetes mellitus without complications: Secondary | ICD-10-CM | POA: Diagnosis not present

## 2018-06-17 DIAGNOSIS — R911 Solitary pulmonary nodule: Secondary | ICD-10-CM | POA: Diagnosis not present

## 2018-06-17 DIAGNOSIS — I774 Celiac artery compression syndrome: Secondary | ICD-10-CM | POA: Diagnosis not present

## 2018-06-17 DIAGNOSIS — I712 Thoracic aortic aneurysm, without rupture: Secondary | ICD-10-CM | POA: Diagnosis not present

## 2018-06-17 DIAGNOSIS — Z9889 Other specified postprocedural states: Secondary | ICD-10-CM | POA: Diagnosis not present

## 2018-06-17 DIAGNOSIS — T82330A Leakage of aortic (bifurcation) graft (replacement), initial encounter: Secondary | ICD-10-CM | POA: Diagnosis not present

## 2018-06-22 DIAGNOSIS — G8929 Other chronic pain: Secondary | ICD-10-CM | POA: Diagnosis not present

## 2018-06-22 DIAGNOSIS — B961 Klebsiella pneumoniae [K. pneumoniae] as the cause of diseases classified elsewhere: Secondary | ICD-10-CM | POA: Diagnosis not present

## 2018-06-22 DIAGNOSIS — R102 Pelvic and perineal pain: Secondary | ICD-10-CM | POA: Diagnosis not present

## 2018-06-22 DIAGNOSIS — N3020 Other chronic cystitis without hematuria: Secondary | ICD-10-CM | POA: Diagnosis not present

## 2018-07-10 DIAGNOSIS — T300 Burn of unspecified body region, unspecified degree: Secondary | ICD-10-CM | POA: Diagnosis not present

## 2018-07-29 DIAGNOSIS — E1151 Type 2 diabetes mellitus with diabetic peripheral angiopathy without gangrene: Secondary | ICD-10-CM | POA: Diagnosis not present

## 2018-07-29 DIAGNOSIS — E1122 Type 2 diabetes mellitus with diabetic chronic kidney disease: Secondary | ICD-10-CM | POA: Diagnosis not present

## 2018-07-29 DIAGNOSIS — N183 Chronic kidney disease, stage 3 (moderate): Secondary | ICD-10-CM | POA: Diagnosis not present

## 2018-07-29 DIAGNOSIS — I10 Essential (primary) hypertension: Secondary | ICD-10-CM | POA: Diagnosis not present

## 2018-08-17 DIAGNOSIS — M79672 Pain in left foot: Secondary | ICD-10-CM | POA: Diagnosis not present

## 2018-08-17 DIAGNOSIS — L602 Onychogryphosis: Secondary | ICD-10-CM | POA: Diagnosis not present

## 2018-08-17 DIAGNOSIS — E119 Type 2 diabetes mellitus without complications: Secondary | ICD-10-CM | POA: Diagnosis not present

## 2018-08-17 DIAGNOSIS — L84 Corns and callosities: Secondary | ICD-10-CM | POA: Diagnosis not present

## 2018-08-17 DIAGNOSIS — M79671 Pain in right foot: Secondary | ICD-10-CM | POA: Diagnosis not present

## 2018-08-26 DIAGNOSIS — I5030 Unspecified diastolic (congestive) heart failure: Secondary | ICD-10-CM | POA: Diagnosis not present

## 2018-08-26 DIAGNOSIS — E1122 Type 2 diabetes mellitus with diabetic chronic kidney disease: Secondary | ICD-10-CM | POA: Diagnosis not present

## 2018-08-26 DIAGNOSIS — F329 Major depressive disorder, single episode, unspecified: Secondary | ICD-10-CM | POA: Diagnosis not present

## 2018-08-26 DIAGNOSIS — I5032 Chronic diastolic (congestive) heart failure: Secondary | ICD-10-CM | POA: Diagnosis not present

## 2018-08-26 DIAGNOSIS — N183 Chronic kidney disease, stage 3 (moderate): Secondary | ICD-10-CM | POA: Diagnosis not present

## 2018-08-26 DIAGNOSIS — I10 Essential (primary) hypertension: Secondary | ICD-10-CM | POA: Diagnosis not present

## 2018-08-26 DIAGNOSIS — M810 Age-related osteoporosis without current pathological fracture: Secondary | ICD-10-CM | POA: Diagnosis not present

## 2018-08-26 DIAGNOSIS — E785 Hyperlipidemia, unspecified: Secondary | ICD-10-CM | POA: Diagnosis not present

## 2018-09-01 DIAGNOSIS — F329 Major depressive disorder, single episode, unspecified: Secondary | ICD-10-CM | POA: Diagnosis not present

## 2018-09-01 DIAGNOSIS — I5032 Chronic diastolic (congestive) heart failure: Secondary | ICD-10-CM | POA: Diagnosis not present

## 2018-09-01 DIAGNOSIS — I10 Essential (primary) hypertension: Secondary | ICD-10-CM | POA: Diagnosis not present

## 2018-09-01 DIAGNOSIS — N183 Chronic kidney disease, stage 3 (moderate): Secondary | ICD-10-CM | POA: Diagnosis not present

## 2018-09-01 DIAGNOSIS — M810 Age-related osteoporosis without current pathological fracture: Secondary | ICD-10-CM | POA: Diagnosis not present

## 2018-09-01 DIAGNOSIS — E785 Hyperlipidemia, unspecified: Secondary | ICD-10-CM | POA: Diagnosis not present

## 2018-09-01 DIAGNOSIS — E1122 Type 2 diabetes mellitus with diabetic chronic kidney disease: Secondary | ICD-10-CM | POA: Diagnosis not present

## 2018-09-01 DIAGNOSIS — D500 Iron deficiency anemia secondary to blood loss (chronic): Secondary | ICD-10-CM | POA: Diagnosis not present

## 2018-09-01 DIAGNOSIS — E1121 Type 2 diabetes mellitus with diabetic nephropathy: Secondary | ICD-10-CM | POA: Diagnosis not present

## 2018-10-19 DIAGNOSIS — M79671 Pain in right foot: Secondary | ICD-10-CM | POA: Diagnosis not present

## 2018-10-19 DIAGNOSIS — E119 Type 2 diabetes mellitus without complications: Secondary | ICD-10-CM | POA: Diagnosis not present

## 2018-10-19 DIAGNOSIS — L602 Onychogryphosis: Secondary | ICD-10-CM | POA: Diagnosis not present

## 2018-10-19 DIAGNOSIS — M79672 Pain in left foot: Secondary | ICD-10-CM | POA: Diagnosis not present

## 2018-10-19 DIAGNOSIS — L84 Corns and callosities: Secondary | ICD-10-CM | POA: Diagnosis not present

## 2018-10-26 DIAGNOSIS — N183 Chronic kidney disease, stage 3 (moderate): Secondary | ICD-10-CM | POA: Diagnosis not present

## 2018-10-26 DIAGNOSIS — R8290 Unspecified abnormal findings in urine: Secondary | ICD-10-CM | POA: Diagnosis not present

## 2018-10-26 DIAGNOSIS — Z23 Encounter for immunization: Secondary | ICD-10-CM | POA: Diagnosis not present

## 2018-10-26 DIAGNOSIS — E1121 Type 2 diabetes mellitus with diabetic nephropathy: Secondary | ICD-10-CM | POA: Diagnosis not present

## 2018-11-10 DIAGNOSIS — N3000 Acute cystitis without hematuria: Secondary | ICD-10-CM | POA: Diagnosis not present

## 2018-12-21 DIAGNOSIS — M79671 Pain in right foot: Secondary | ICD-10-CM | POA: Diagnosis not present

## 2018-12-21 DIAGNOSIS — L84 Corns and callosities: Secondary | ICD-10-CM | POA: Diagnosis not present

## 2018-12-21 DIAGNOSIS — L602 Onychogryphosis: Secondary | ICD-10-CM | POA: Diagnosis not present

## 2018-12-21 DIAGNOSIS — M79672 Pain in left foot: Secondary | ICD-10-CM | POA: Diagnosis not present

## 2018-12-21 DIAGNOSIS — E119 Type 2 diabetes mellitus without complications: Secondary | ICD-10-CM | POA: Diagnosis not present

## 2018-12-23 DIAGNOSIS — J9811 Atelectasis: Secondary | ICD-10-CM | POA: Diagnosis not present

## 2018-12-23 DIAGNOSIS — T82330S Leakage of aortic (bifurcation) graft (replacement), sequela: Secondary | ICD-10-CM | POA: Diagnosis not present

## 2018-12-23 DIAGNOSIS — T82330A Leakage of aortic (bifurcation) graft (replacement), initial encounter: Secondary | ICD-10-CM | POA: Diagnosis not present

## 2018-12-23 DIAGNOSIS — I712 Thoracic aortic aneurysm, without rupture: Secondary | ICD-10-CM | POA: Diagnosis not present

## 2018-12-28 ENCOUNTER — Telehealth: Payer: Self-pay

## 2018-12-28 NOTE — Telephone Encounter (Signed)
Phone call placed to patient. Spoke with husband to schedule visit with Palliative Care. Scheduled for 01/01/2019

## 2019-01-01 ENCOUNTER — Other Ambulatory Visit: Payer: Medicare HMO | Admitting: Internal Medicine

## 2019-01-04 DIAGNOSIS — R300 Dysuria: Secondary | ICD-10-CM | POA: Diagnosis not present

## 2019-01-28 ENCOUNTER — Telehealth: Payer: Self-pay

## 2019-01-28 ENCOUNTER — Other Ambulatory Visit: Payer: Medicare HMO | Admitting: Internal Medicine

## 2019-01-28 DIAGNOSIS — Z515 Encounter for palliative care: Secondary | ICD-10-CM

## 2019-01-28 NOTE — Telephone Encounter (Signed)
Call from patient to cancel MD appt with DR. Varadarajan tomorrow at 1:15 pm. RN called MD office and cancelled appt. NP with palliative care notified as well.

## 2019-02-02 DIAGNOSIS — E1151 Type 2 diabetes mellitus with diabetic peripheral angiopathy without gangrene: Secondary | ICD-10-CM | POA: Diagnosis not present

## 2019-02-02 DIAGNOSIS — K529 Noninfective gastroenteritis and colitis, unspecified: Secondary | ICD-10-CM | POA: Diagnosis not present

## 2019-02-02 DIAGNOSIS — L219 Seborrheic dermatitis, unspecified: Secondary | ICD-10-CM | POA: Diagnosis not present

## 2019-02-09 ENCOUNTER — Telehealth: Payer: Self-pay | Admitting: Licensed Clinical Social Worker

## 2019-02-09 NOTE — Telephone Encounter (Signed)
Palliative Care SW spoke with patient to offer assistance per the request of NP, Margaretha Sheffield.  Patient and her husband currently receive one free meal a day.  She also stated her daughter assists her with medication management.  Patient declined any further assistance.

## 2019-02-18 DIAGNOSIS — L218 Other seborrheic dermatitis: Secondary | ICD-10-CM | POA: Diagnosis not present

## 2019-03-01 NOTE — Progress Notes (Signed)
    [facility] Consult Note Telephone: [phone]  Fax: [phone]  PATIENT NAME: Jennifer [name] DOB: 04/19/1925 MRN: [id number]  PRIMARY CARE PROVIDER:   Leeroy Cha, MD  REFERRING PROVIDER:  Leeroy Cha, MD [address] [place], [place] [address]   NOTE:        Arrive at patient's home for follow-up Palliative care visit.  Met by patient at the door who stated that she and her husband had been ill for aprox 2-3 days with nausea and diarrhea.  Reports that her husband had a fever.  I informed the patient that I would not be able to complete our visit today and encouraged her to attempt to increase their oral hydration with clear liquids, rest and to contact her PCP as needed if her symptoms were not improving over the next day.   Pt. Also has been having discomfort without bleeding from her hemorrhoids due to diarrhea.   I phoned her son, Isabell Jarvis, and gave him the same instructions for his parents and to obtain, Tucks wipes and Anusol cream for his mother's hemorrhoids.  We will attempt to re-schedule the visit in the near future after their illnesses have resolved.  Patient and son thanked me for the call and attempted visit.    Gonzella Lex, NP

## 2019-03-29 DIAGNOSIS — B372 Candidiasis of skin and nail: Secondary | ICD-10-CM | POA: Diagnosis not present

## 2019-04-28 ENCOUNTER — Other Ambulatory Visit: Payer: Self-pay | Admitting: Internal Medicine

## 2019-04-28 ENCOUNTER — Ambulatory Visit
Admission: RE | Admit: 2019-04-28 | Discharge: 2019-04-28 | Disposition: A | Discharge status: Home / Self Care | Payer: Medicare HMO | Source: Ambulatory Visit | Attending: Internal Medicine | Admitting: Internal Medicine

## 2019-04-28 DIAGNOSIS — W19XXXA Unspecified fall, initial encounter: Secondary | ICD-10-CM | POA: Diagnosis not present

## 2019-04-28 DIAGNOSIS — M545 Low back pain: Secondary | ICD-10-CM | POA: Diagnosis not present

## 2019-04-28 DIAGNOSIS — M810 Age-related osteoporosis without current pathological fracture: Secondary | ICD-10-CM | POA: Diagnosis not present

## 2019-04-28 DIAGNOSIS — M25551 Pain in right hip: Secondary | ICD-10-CM | POA: Diagnosis not present

## 2019-04-28 DIAGNOSIS — I10 Essential (primary) hypertension: Secondary | ICD-10-CM | POA: Diagnosis not present

## 2019-04-28 DIAGNOSIS — M5431 Sciatica, right side: Secondary | ICD-10-CM | POA: Diagnosis not present

## 2019-05-10 DIAGNOSIS — N183 Chronic kidney disease, stage 3 (moderate): Secondary | ICD-10-CM | POA: Diagnosis not present

## 2019-05-10 DIAGNOSIS — N3001 Acute cystitis with hematuria: Secondary | ICD-10-CM | POA: Diagnosis not present

## 2019-05-10 DIAGNOSIS — I10 Essential (primary) hypertension: Secondary | ICD-10-CM | POA: Diagnosis not present

## 2019-05-10 DIAGNOSIS — N3010 Interstitial cystitis (chronic) without hematuria: Secondary | ICD-10-CM | POA: Diagnosis not present

## 2019-05-10 DIAGNOSIS — E1121 Type 2 diabetes mellitus with diabetic nephropathy: Secondary | ICD-10-CM | POA: Diagnosis not present

## 2019-05-10 DIAGNOSIS — E1151 Type 2 diabetes mellitus with diabetic peripheral angiopathy without gangrene: Secondary | ICD-10-CM | POA: Diagnosis not present

## 2019-05-22 DIAGNOSIS — H5200 Hypermetropia, unspecified eye: Secondary | ICD-10-CM | POA: Diagnosis not present

## 2019-05-22 DIAGNOSIS — E119 Type 2 diabetes mellitus without complications: Secondary | ICD-10-CM | POA: Diagnosis not present

## 2019-05-22 DIAGNOSIS — H26493 Other secondary cataract, bilateral: Secondary | ICD-10-CM | POA: Diagnosis not present

## 2019-05-22 DIAGNOSIS — H35033 Hypertensive retinopathy, bilateral: Secondary | ICD-10-CM | POA: Diagnosis not present

## 2019-05-25 DIAGNOSIS — L602 Onychogryphosis: Secondary | ICD-10-CM | POA: Diagnosis not present

## 2019-05-25 DIAGNOSIS — E119 Type 2 diabetes mellitus without complications: Secondary | ICD-10-CM | POA: Diagnosis not present

## 2019-05-25 DIAGNOSIS — M79671 Pain in right foot: Secondary | ICD-10-CM | POA: Diagnosis not present

## 2019-05-25 DIAGNOSIS — M79672 Pain in left foot: Secondary | ICD-10-CM | POA: Diagnosis not present

## 2019-05-25 DIAGNOSIS — L84 Corns and callosities: Secondary | ICD-10-CM | POA: Diagnosis not present

## 2019-05-31 DIAGNOSIS — M1610 Unilateral primary osteoarthritis, unspecified hip: Secondary | ICD-10-CM | POA: Diagnosis not present

## 2019-05-31 DIAGNOSIS — N183 Chronic kidney disease, stage 3 (moderate): Secondary | ICD-10-CM | POA: Diagnosis not present

## 2019-05-31 DIAGNOSIS — E1151 Type 2 diabetes mellitus with diabetic peripheral angiopathy without gangrene: Secondary | ICD-10-CM | POA: Diagnosis not present

## 2019-05-31 DIAGNOSIS — E1122 Type 2 diabetes mellitus with diabetic chronic kidney disease: Secondary | ICD-10-CM | POA: Diagnosis not present

## 2019-05-31 DIAGNOSIS — M4726 Other spondylosis with radiculopathy, lumbar region: Secondary | ICD-10-CM | POA: Diagnosis not present

## 2019-05-31 DIAGNOSIS — Z0000 Encounter for general adult medical examination without abnormal findings: Secondary | ICD-10-CM | POA: Diagnosis not present

## 2019-05-31 DIAGNOSIS — N390 Urinary tract infection, site not specified: Secondary | ICD-10-CM | POA: Diagnosis not present

## 2019-05-31 DIAGNOSIS — Z23 Encounter for immunization: Secondary | ICD-10-CM | POA: Diagnosis not present

## 2019-05-31 DIAGNOSIS — E781 Pure hyperglyceridemia: Secondary | ICD-10-CM | POA: Diagnosis not present

## 2019-05-31 DIAGNOSIS — I10 Essential (primary) hypertension: Secondary | ICD-10-CM | POA: Diagnosis not present

## 2019-06-21 DIAGNOSIS — N3020 Other chronic cystitis without hematuria: Secondary | ICD-10-CM | POA: Diagnosis not present

## 2019-06-21 DIAGNOSIS — G8929 Other chronic pain: Secondary | ICD-10-CM | POA: Diagnosis not present

## 2019-06-21 DIAGNOSIS — R102 Pelvic and perineal pain: Secondary | ICD-10-CM | POA: Diagnosis not present

## 2019-06-22 ENCOUNTER — Telehealth: Payer: Self-pay

## 2019-06-22 NOTE — Telephone Encounter (Signed)
Phone call placed to patient's husband to check in and to offer follow up visit with NP. Husband declined visit and feels that they do not need anything at this time

## 2019-07-06 DIAGNOSIS — L218 Other seborrheic dermatitis: Secondary | ICD-10-CM | POA: Diagnosis not present

## 2019-07-13 DIAGNOSIS — N3020 Other chronic cystitis without hematuria: Secondary | ICD-10-CM | POA: Diagnosis not present

## 2019-08-04 DIAGNOSIS — M810 Age-related osteoporosis without current pathological fracture: Secondary | ICD-10-CM | POA: Diagnosis not present

## 2019-08-04 DIAGNOSIS — R350 Frequency of micturition: Secondary | ICD-10-CM | POA: Diagnosis not present

## 2019-08-04 DIAGNOSIS — I10 Essential (primary) hypertension: Secondary | ICD-10-CM | POA: Diagnosis not present

## 2019-08-04 DIAGNOSIS — E785 Hyperlipidemia, unspecified: Secondary | ICD-10-CM | POA: Diagnosis not present

## 2019-09-01 DIAGNOSIS — F329 Major depressive disorder, single episode, unspecified: Secondary | ICD-10-CM | POA: Diagnosis not present

## 2019-09-01 DIAGNOSIS — N183 Chronic kidney disease, stage 3 (moderate): Secondary | ICD-10-CM | POA: Diagnosis not present

## 2019-09-01 DIAGNOSIS — M810 Age-related osteoporosis without current pathological fracture: Secondary | ICD-10-CM | POA: Diagnosis not present

## 2019-09-01 DIAGNOSIS — E1122 Type 2 diabetes mellitus with diabetic chronic kidney disease: Secondary | ICD-10-CM | POA: Diagnosis not present

## 2019-09-01 DIAGNOSIS — I5032 Chronic diastolic (congestive) heart failure: Secondary | ICD-10-CM | POA: Diagnosis not present

## 2019-09-01 DIAGNOSIS — D500 Iron deficiency anemia secondary to blood loss (chronic): Secondary | ICD-10-CM | POA: Diagnosis not present

## 2019-09-01 DIAGNOSIS — I10 Essential (primary) hypertension: Secondary | ICD-10-CM | POA: Diagnosis not present

## 2019-09-01 DIAGNOSIS — E785 Hyperlipidemia, unspecified: Secondary | ICD-10-CM | POA: Diagnosis not present

## 2019-09-07 DIAGNOSIS — M79671 Pain in right foot: Secondary | ICD-10-CM | POA: Diagnosis not present

## 2019-09-07 DIAGNOSIS — L602 Onychogryphosis: Secondary | ICD-10-CM | POA: Diagnosis not present

## 2019-09-07 DIAGNOSIS — E119 Type 2 diabetes mellitus without complications: Secondary | ICD-10-CM | POA: Diagnosis not present

## 2019-09-07 DIAGNOSIS — L84 Corns and callosities: Secondary | ICD-10-CM | POA: Diagnosis not present

## 2019-09-07 DIAGNOSIS — M79672 Pain in left foot: Secondary | ICD-10-CM | POA: Diagnosis not present

## 2019-09-14 DIAGNOSIS — M25551 Pain in right hip: Secondary | ICD-10-CM | POA: Diagnosis not present

## 2019-09-15 DIAGNOSIS — M810 Age-related osteoporosis without current pathological fracture: Secondary | ICD-10-CM | POA: Diagnosis not present

## 2019-09-15 DIAGNOSIS — I10 Essential (primary) hypertension: Secondary | ICD-10-CM | POA: Diagnosis not present

## 2019-09-15 DIAGNOSIS — N183 Chronic kidney disease, stage 3 unspecified: Secondary | ICD-10-CM | POA: Diagnosis not present

## 2019-09-15 DIAGNOSIS — D500 Iron deficiency anemia secondary to blood loss (chronic): Secondary | ICD-10-CM | POA: Diagnosis not present

## 2019-09-15 DIAGNOSIS — I5032 Chronic diastolic (congestive) heart failure: Secondary | ICD-10-CM | POA: Diagnosis not present

## 2019-09-15 DIAGNOSIS — E1121 Type 2 diabetes mellitus with diabetic nephropathy: Secondary | ICD-10-CM | POA: Diagnosis not present

## 2019-09-15 DIAGNOSIS — E785 Hyperlipidemia, unspecified: Secondary | ICD-10-CM | POA: Diagnosis not present

## 2019-09-15 DIAGNOSIS — F329 Major depressive disorder, single episode, unspecified: Secondary | ICD-10-CM | POA: Diagnosis not present

## 2019-09-15 DIAGNOSIS — E1122 Type 2 diabetes mellitus with diabetic chronic kidney disease: Secondary | ICD-10-CM | POA: Diagnosis not present

## 2019-09-16 ENCOUNTER — Ambulatory Visit (INDEPENDENT_AMBULATORY_CARE_PROVIDER_SITE_OTHER): Payer: Medicare HMO | Admitting: Family Medicine

## 2019-09-16 ENCOUNTER — Other Ambulatory Visit: Payer: Self-pay

## 2019-09-16 ENCOUNTER — Encounter: Payer: Self-pay | Admitting: Family Medicine

## 2019-09-16 ENCOUNTER — Ambulatory Visit: Payer: Self-pay

## 2019-09-16 DIAGNOSIS — M25551 Pain in right hip: Secondary | ICD-10-CM

## 2019-09-16 NOTE — Progress Notes (Signed)
Office Visit Note   Patient: Jennifer [name]           Date of Birth: Aug 02, 1925           MRN: [id number] Visit Date: 09/16/2019 Requested by: Leeroy Cha, MD [address] [place],  [place] [address] PCP: Leeroy Cha, MD  Subjective: Chief Complaint  Patient presents with  . pain lateral thigh x months & worsening    HPI: She is here with right hip pain.  Symptoms started probably 5 or 6 months ago.  She recalls bending to get something in her kitchen, falling to the floor.  She did not have significant pain immediately, but a few days later she started having pain in the anterior lateral hip and thigh.  It has been constant pain, keeping her awake at night.  Aleve gives her temporary relief.  Denies any low back pain, pain does not radiate below the knee.  Denies any bowel or bladder dysfunction related to this, or weakness or numbness in her leg.  No previous problems with her hip.  She had x-rays in May which did not show any fracture.  I reviewed by myself and she does have very mild DJD and some arthritis in her SI joint.  She is here with her husband today.              ROS: No fevers or chills.  All other systems were reviewed and are negative.  Objective: Vital Signs: There were no vitals taken for this visit.  Physical Exam:  General:  Alert and oriented, in no acute distress. Pulm:  Breathing unlabored. Psy:  Normal mood, congruent affect. Skin: No rash or erythema. Right hip: Her low back is not significantly tender to palpation.  She does have a little tenderness near the right SI joint.  Mildly tender over the greater trochanter.  She has quite a bit of pain with passive hip flexion and internal rotation.  Mild pain with hip flexion against resistance but her strength is normal.  No pain with adduction or abduction against resistance.  Remainder of lower extremity strength is normal.  Imaging: None today.  Previous x-rays viewed on the  computer.  Lumbar x-rays show significant degenerative scoliosis which is unchanged from 2011 CT scan images.   Assessment & Plan: 1.  Persistent right hip and thigh pain about 5 months status post fall, possibly due to exacerbation of DJD.  Cannot rule out lumbar nerve impingement. -Discussed options with patient and elected to do a diagnostic/therapeutic intra-articular hip injection today under ultrasound guidance.  She will call me next week to let me know how she is doing.  If no improvement, then MRI of the hip and lumbar spine.     Procedures:  Procedure: Ultrasound-guided right hip injection: After sterile prep with Betadine, injected 8 cc 1% lidocaine without epinephrine and 40 mg methylprednisolone using a 22-gauge spinal needle, passing the needle through the iliofemoral ligament into the femoral head/neck junction.  Injectate was seen filling the joint capsule.  She had modest improvement during the anesthetic phase.     PMFS History: Patient Active Problem List   Diagnosis Date Noted  . Anemia 10/08/2015  . Diastolic CHF ([place]) 09/38/1829  . Gastrointestinal hemorrhage with melena   . Pressure ulcer 04/27/2015  . Lower GI bleed 04/24/2015  . GI bleed 04/24/2015  . Acute respiratory distress 03/31/2015  . Hypertensive urgency 03/31/2015  . Anemia, iron deficiency 03/02/2015  . Klebsiella  cystitis 03/02/2015  . Abscess of abdominal wall 02/28/2015  . Abdominal abscess 02/28/2015  . Abdominal wall abscess at site of surgical wound 02/28/2015  . Acute blood loss anemia 10/05/2014  . Thoracic aortic aneurysm (HCC) 09/27/2014  . Dissecting aortic aneurysm (any part), thoracic (HCC) 09/27/2014  . Aneurysm, thoracic aortic (HCC) 09/26/2014  . Hyperlipidemia 09/16/2014  . Diabetes (HCC) 09/13/2014  . CHF (congestive heart failure) (HCC) 09/13/2014  . HTN (hypertension) 09/13/2014  . Physical deconditioning 09/12/2014  . 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 Acute on chronic diastolic CHF (congestive heart failure), NYHA class 3 (HCC) 09/08/2014  . Left sided chest pain 09/08/2014  . CKD (chronic kidney disease), stage III 09/08/2014  . Acute respiratory failure with hypoxia (HCC) 09/01/2014  . Acute pulmonary edema (HCC) 09/01/2014  . Cardiogenic shock (HCC) 09/01/2014  . Intramural aortic hematoma (HCC) 08/28/2014   Past Medical History:  Diagnosis Date  . AAA (abdominal aortic aneurysm)-repaired   . Acute bronchitis   . Anemia   . CHF (congestive heart failure) (HCC)   . Diabetes mellitus without complication (HCC)   . GERD (gastroesophageal reflux disease)   . Hypertension   . Irregular heart beat   . Shortness of breath dyspnea   . Tachycardia    Unspecified    Family History  Problem Relation Age of Onset  . Heart disease Sister   . Heart attack Mother   . Cancer Brother   . Heart attack Sister   . Heart attack Brother     Past Surgical History:  Procedure Laterality Date  . ABDOMINAL AORTIC ANEURYSM REPAIR  2012   Dr. Arbie Cookey  . ABDOMINAL HYSTERECTOMY     40 years ago, intestinal damage  . BREAST LUMPECTOMY Left    Benign Mass  . ESOPHAGOGASTRODUODENOSCOPY (EGD) WITH PROPOFOL Left 10/09/2015   Procedure: ESOPHAGOGASTRODUODENOSCOPY (EGD) WITH PROPOFOL;  Surgeon: Willis Modena, MD;  Location: [facility] ENDOSCOPY;  Service: Endoscopy;  Laterality: Left;  . INCISION AND DRAINAGE ABSCESS N/A 02/28/2015   Procedure: INCISION AND DRAINAGE ABSCESS ABDOMINAL WALL;  Surgeon: Claud Kelp, MD;  Location: [facility] OR;  Service: General;  Laterality: N/A;  . IR RADIOLOGIST EVAL & MGMT  04/24/2017  . OOPHORECTOMY     40 years ago  . THORACIC AORTIC ENDOVASCULAR STENT GRAFT N/A 09/27/2014   Procedure: THORACIC AORTIC ENDOVASCULAR STENT GRAFT;  Surgeon: Nada Libman, MD;  Location: [facility] OR;  Service: Vascular;  Laterality: N/A;   Social History   Occupational History  . Not on file  Tobacco Use  . Smoking status: Former Smoker    Quit  date: 1975    Years since quitting: 45.8  . Smokeless tobacco: Never Used  . Tobacco comment: quit smoking in 1975  Substance and Sexual Activity  . Alcohol use: No  . Drug use: No  . Sexual activity: Not on file

## 2019-09-20 ENCOUNTER — Other Ambulatory Visit: Payer: Self-pay | Admitting: Family Medicine

## 2019-09-20 DIAGNOSIS — M25551 Pain in right hip: Secondary | ICD-10-CM

## 2019-09-20 MED ORDER — TRAMADOL HCL 50 MG PO TABS: 50.0000 mg | ORAL_TABLET | Freq: Four times a day (QID) | ORAL | 0 refills | Status: DC | PRN

## 2019-09-20 NOTE — Addendum Note (Signed)
Addended by: Daylene Posey T on: 09/20/2019 11:01 AM   Modules accepted: Orders

## 2019-09-25 ENCOUNTER — Other Ambulatory Visit: Payer: Self-pay

## 2019-09-25 ENCOUNTER — Ambulatory Visit (HOSPITAL_BASED_OUTPATIENT_CLINIC_OR_DEPARTMENT_OTHER)
Admission: RE | Admit: 2019-09-25 | Discharge: 2019-09-25 | Disposition: A | Discharge status: Home / Self Care | Payer: Medicare HMO | Source: Ambulatory Visit | Attending: Family Medicine | Admitting: Family Medicine

## 2019-09-25 DIAGNOSIS — M25551 Pain in right hip: Secondary | ICD-10-CM | POA: Diagnosis not present

## 2019-09-25 DIAGNOSIS — M48061 Spinal stenosis, lumbar region without neurogenic claudication: Secondary | ICD-10-CM | POA: Diagnosis not present

## 2019-09-25 DIAGNOSIS — M1611 Unilateral primary osteoarthritis, right hip: Secondary | ICD-10-CM | POA: Diagnosis not present

## 2019-09-28 ENCOUNTER — Telehealth: Payer: Self-pay | Admitting: Family Medicine

## 2019-09-28 DIAGNOSIS — R399 Unspecified symptoms and signs involving the genitourinary system: Secondary | ICD-10-CM | POA: Diagnosis not present

## 2019-09-28 NOTE — Telephone Encounter (Signed)
Hip MRI, other than mild arthritis, looks good.    Lumbar spine MRI shows a lot of arthritic bone spurs combined with bulging discs, which result in narrowing of the spinal canal and nerve openings at two levels.  Since the hip injection didn't help, I think the pain is probably coming from the spine.  Presuming she is still in a lot of pain, options would include:  - Referral to Dr. Ernestina Patches for lumbar epidural steroid injection.  - Referral for physical therapy.  Whichever she prefers is fine.

## 2019-09-29 NOTE — Telephone Encounter (Signed)
FYI: I advised the patient and her husband of the MRI results. She is feeling better now that she has tried using the heating pad on her hip/leg. Since that is working well, she is choosing to just continue that for now.

## 2019-10-06 DIAGNOSIS — K644 Residual hemorrhoidal skin tags: Secondary | ICD-10-CM | POA: Diagnosis not present

## 2019-10-06 DIAGNOSIS — R319 Hematuria, unspecified: Secondary | ICD-10-CM | POA: Diagnosis not present

## 2019-10-07 DIAGNOSIS — R319 Hematuria, unspecified: Secondary | ICD-10-CM | POA: Diagnosis not present

## 2019-11-18 DIAGNOSIS — R300 Dysuria: Secondary | ICD-10-CM | POA: Diagnosis not present

## 2019-11-22 DIAGNOSIS — R05 Cough: Secondary | ICD-10-CM | POA: Diagnosis not present

## 2019-12-08 DIAGNOSIS — M79601 Pain in right arm: Secondary | ICD-10-CM | POA: Diagnosis not present

## 2019-12-08 DIAGNOSIS — I10 Essential (primary) hypertension: Secondary | ICD-10-CM | POA: Diagnosis not present

## 2019-12-08 DIAGNOSIS — N952 Postmenopausal atrophic vaginitis: Secondary | ICD-10-CM | POA: Diagnosis not present

## 2019-12-08 DIAGNOSIS — E1151 Type 2 diabetes mellitus with diabetic peripheral angiopathy without gangrene: Secondary | ICD-10-CM | POA: Diagnosis not present

## 2019-12-08 DIAGNOSIS — F0390 Unspecified dementia without behavioral disturbance: Secondary | ICD-10-CM | POA: Diagnosis not present

## 2019-12-08 DIAGNOSIS — N183 Chronic kidney disease, stage 3 unspecified: Secondary | ICD-10-CM | POA: Diagnosis not present

## 2019-12-16 DIAGNOSIS — F329 Major depressive disorder, single episode, unspecified: Secondary | ICD-10-CM | POA: Diagnosis not present

## 2019-12-16 DIAGNOSIS — M810 Age-related osteoporosis without current pathological fracture: Secondary | ICD-10-CM | POA: Diagnosis not present

## 2019-12-16 DIAGNOSIS — E785 Hyperlipidemia, unspecified: Secondary | ICD-10-CM | POA: Diagnosis not present

## 2019-12-16 DIAGNOSIS — D500 Iron deficiency anemia secondary to blood loss (chronic): Secondary | ICD-10-CM | POA: Diagnosis not present

## 2019-12-16 DIAGNOSIS — I10 Essential (primary) hypertension: Secondary | ICD-10-CM | POA: Diagnosis not present

## 2019-12-16 DIAGNOSIS — E1151 Type 2 diabetes mellitus with diabetic peripheral angiopathy without gangrene: Secondary | ICD-10-CM | POA: Diagnosis not present

## 2019-12-16 DIAGNOSIS — E1122 Type 2 diabetes mellitus with diabetic chronic kidney disease: Secondary | ICD-10-CM | POA: Diagnosis not present

## 2019-12-16 DIAGNOSIS — I5032 Chronic diastolic (congestive) heart failure: Secondary | ICD-10-CM | POA: Diagnosis not present

## 2019-12-16 DIAGNOSIS — E1121 Type 2 diabetes mellitus with diabetic nephropathy: Secondary | ICD-10-CM | POA: Diagnosis not present

## 2020-01-05 DIAGNOSIS — M79671 Pain in right foot: Secondary | ICD-10-CM | POA: Diagnosis not present

## 2020-01-05 DIAGNOSIS — L84 Corns and callosities: Secondary | ICD-10-CM | POA: Diagnosis not present

## 2020-01-05 DIAGNOSIS — M79672 Pain in left foot: Secondary | ICD-10-CM | POA: Diagnosis not present

## 2020-01-05 DIAGNOSIS — L602 Onychogryphosis: Secondary | ICD-10-CM | POA: Diagnosis not present

## 2020-01-05 DIAGNOSIS — E119 Type 2 diabetes mellitus without complications: Secondary | ICD-10-CM | POA: Diagnosis not present

## 2020-01-06 DIAGNOSIS — F329 Major depressive disorder, single episode, unspecified: Secondary | ICD-10-CM | POA: Diagnosis not present

## 2020-01-06 DIAGNOSIS — E785 Hyperlipidemia, unspecified: Secondary | ICD-10-CM | POA: Diagnosis not present

## 2020-01-06 DIAGNOSIS — M810 Age-related osteoporosis without current pathological fracture: Secondary | ICD-10-CM | POA: Diagnosis not present

## 2020-01-06 DIAGNOSIS — D500 Iron deficiency anemia secondary to blood loss (chronic): Secondary | ICD-10-CM | POA: Diagnosis not present

## 2020-01-06 DIAGNOSIS — E1151 Type 2 diabetes mellitus with diabetic peripheral angiopathy without gangrene: Secondary | ICD-10-CM | POA: Diagnosis not present

## 2020-01-06 DIAGNOSIS — I10 Essential (primary) hypertension: Secondary | ICD-10-CM | POA: Diagnosis not present

## 2020-01-06 DIAGNOSIS — I5032 Chronic diastolic (congestive) heart failure: Secondary | ICD-10-CM | POA: Diagnosis not present

## 2020-01-06 DIAGNOSIS — E1122 Type 2 diabetes mellitus with diabetic chronic kidney disease: Secondary | ICD-10-CM | POA: Diagnosis not present

## 2020-01-23 ENCOUNTER — Ambulatory Visit: Payer: Medicare HMO

## 2020-03-15 DIAGNOSIS — E1122 Type 2 diabetes mellitus with diabetic chronic kidney disease: Secondary | ICD-10-CM | POA: Diagnosis not present

## 2020-03-15 DIAGNOSIS — R300 Dysuria: Secondary | ICD-10-CM | POA: Diagnosis not present

## 2020-03-16 DIAGNOSIS — R300 Dysuria: Secondary | ICD-10-CM | POA: Diagnosis not present

## 2020-04-04 DIAGNOSIS — R2689 Other abnormalities of gait and mobility: Secondary | ICD-10-CM | POA: Diagnosis not present

## 2020-04-04 DIAGNOSIS — N952 Postmenopausal atrophic vaginitis: Secondary | ICD-10-CM | POA: Diagnosis not present

## 2020-04-04 DIAGNOSIS — M19041 Primary osteoarthritis, right hand: Secondary | ICD-10-CM | POA: Diagnosis not present

## 2020-04-04 DIAGNOSIS — N3281 Overactive bladder: Secondary | ICD-10-CM | POA: Diagnosis not present

## 2020-04-04 DIAGNOSIS — I5032 Chronic diastolic (congestive) heart failure: Secondary | ICD-10-CM | POA: Diagnosis not present

## 2020-04-04 DIAGNOSIS — M19042 Primary osteoarthritis, left hand: Secondary | ICD-10-CM | POA: Diagnosis not present

## 2020-04-04 DIAGNOSIS — K5901 Slow transit constipation: Secondary | ICD-10-CM | POA: Diagnosis not present

## 2020-04-06 DIAGNOSIS — M79672 Pain in left foot: Secondary | ICD-10-CM | POA: Diagnosis not present

## 2020-04-06 DIAGNOSIS — M79671 Pain in right foot: Secondary | ICD-10-CM | POA: Diagnosis not present

## 2020-04-06 DIAGNOSIS — L602 Onychogryphosis: Secondary | ICD-10-CM | POA: Diagnosis not present

## 2020-04-06 DIAGNOSIS — L84 Corns and callosities: Secondary | ICD-10-CM | POA: Diagnosis not present

## 2020-04-06 DIAGNOSIS — E119 Type 2 diabetes mellitus without complications: Secondary | ICD-10-CM | POA: Diagnosis not present

## 2020-04-19 ENCOUNTER — Ambulatory Visit: Payer: Medicare HMO | Admitting: Family Medicine

## 2020-04-27 DIAGNOSIS — F329 Major depressive disorder, single episode, unspecified: Secondary | ICD-10-CM | POA: Diagnosis not present

## 2020-04-27 DIAGNOSIS — M810 Age-related osteoporosis without current pathological fracture: Secondary | ICD-10-CM | POA: Diagnosis not present

## 2020-04-27 DIAGNOSIS — E1122 Type 2 diabetes mellitus with diabetic chronic kidney disease: Secondary | ICD-10-CM | POA: Diagnosis not present

## 2020-04-27 DIAGNOSIS — E1151 Type 2 diabetes mellitus with diabetic peripheral angiopathy without gangrene: Secondary | ICD-10-CM | POA: Diagnosis not present

## 2020-04-27 DIAGNOSIS — E785 Hyperlipidemia, unspecified: Secondary | ICD-10-CM | POA: Diagnosis not present

## 2020-04-27 DIAGNOSIS — I10 Essential (primary) hypertension: Secondary | ICD-10-CM | POA: Diagnosis not present

## 2020-04-27 DIAGNOSIS — M19041 Primary osteoarthritis, right hand: Secondary | ICD-10-CM | POA: Diagnosis not present

## 2020-04-27 DIAGNOSIS — I5032 Chronic diastolic (congestive) heart failure: Secondary | ICD-10-CM | POA: Diagnosis not present

## 2020-04-27 DIAGNOSIS — E1121 Type 2 diabetes mellitus with diabetic nephropathy: Secondary | ICD-10-CM | POA: Diagnosis not present

## 2020-04-29 ENCOUNTER — Other Ambulatory Visit: Payer: Self-pay

## 2020-04-29 ENCOUNTER — Encounter (HOSPITAL_COMMUNITY): Payer: Self-pay | Admitting: Emergency Medicine

## 2020-04-29 ENCOUNTER — Inpatient Hospital Stay (HOSPITAL_COMMUNITY)
Admission: EM | Admit: 2020-04-29 | Discharge: 2020-05-04 | DRG: 378 | Disposition: A | Discharge status: Home Health | Payer: Medicare HMO | Attending: Internal Medicine | Admitting: Internal Medicine

## 2020-04-29 DIAGNOSIS — I4581 Long QT syndrome: Secondary | ICD-10-CM | POA: Diagnosis not present

## 2020-04-29 DIAGNOSIS — Z90710 Acquired absence of both cervix and uterus: Secondary | ICD-10-CM

## 2020-04-29 DIAGNOSIS — K922 Gastrointestinal hemorrhage, unspecified: Secondary | ICD-10-CM | POA: Diagnosis present

## 2020-04-29 DIAGNOSIS — E878 Other disorders of electrolyte and fluid balance, not elsewhere classified: Secondary | ICD-10-CM | POA: Diagnosis present

## 2020-04-29 DIAGNOSIS — D62 Acute posthemorrhagic anemia: Secondary | ICD-10-CM | POA: Diagnosis present

## 2020-04-29 DIAGNOSIS — K644 Residual hemorrhoidal skin tags: Secondary | ICD-10-CM | POA: Diagnosis present

## 2020-04-29 DIAGNOSIS — R58 Hemorrhage, not elsewhere classified: Secondary | ICD-10-CM | POA: Diagnosis not present

## 2020-04-29 DIAGNOSIS — Z7984 Long term (current) use of oral hypoglycemic drugs: Secondary | ICD-10-CM

## 2020-04-29 DIAGNOSIS — R5381 Other malaise: Secondary | ICD-10-CM | POA: Diagnosis present

## 2020-04-29 DIAGNOSIS — N179 Acute kidney failure, unspecified: Secondary | ICD-10-CM | POA: Diagnosis present

## 2020-04-29 DIAGNOSIS — D509 Iron deficiency anemia, unspecified: Secondary | ICD-10-CM | POA: Diagnosis present

## 2020-04-29 DIAGNOSIS — K219 Gastro-esophageal reflux disease without esophagitis: Secondary | ICD-10-CM | POA: Diagnosis present

## 2020-04-29 DIAGNOSIS — M810 Age-related osteoporosis without current pathological fracture: Secondary | ICD-10-CM | POA: Diagnosis present

## 2020-04-29 DIAGNOSIS — Z882 Allergy status to sulfonamides status: Secondary | ICD-10-CM

## 2020-04-29 DIAGNOSIS — K5790 Diverticulosis of intestine, part unspecified, without perforation or abscess without bleeding: Secondary | ICD-10-CM | POA: Diagnosis not present

## 2020-04-29 DIAGNOSIS — I248 Other forms of acute ischemic heart disease: Secondary | ICD-10-CM | POA: Diagnosis present

## 2020-04-29 DIAGNOSIS — K5731 Diverticulosis of large intestine without perforation or abscess with bleeding: Secondary | ICD-10-CM | POA: Diagnosis present

## 2020-04-29 DIAGNOSIS — R52 Pain, unspecified: Secondary | ICD-10-CM | POA: Diagnosis not present

## 2020-04-29 DIAGNOSIS — R9431 Abnormal electrocardiogram [ECG] [EKG]: Secondary | ICD-10-CM | POA: Diagnosis present

## 2020-04-29 DIAGNOSIS — Z888 Allergy status to other drugs, medicaments and biological substances status: Secondary | ICD-10-CM

## 2020-04-29 DIAGNOSIS — E1122 Type 2 diabetes mellitus with diabetic chronic kidney disease: Secondary | ICD-10-CM | POA: Diagnosis present

## 2020-04-29 DIAGNOSIS — N183 Chronic kidney disease, stage 3 unspecified: Secondary | ICD-10-CM | POA: Diagnosis not present

## 2020-04-29 DIAGNOSIS — K625 Hemorrhage of anus and rectum: Secondary | ICD-10-CM | POA: Diagnosis not present

## 2020-04-29 DIAGNOSIS — Z7982 Long term (current) use of aspirin: Secondary | ICD-10-CM

## 2020-04-29 DIAGNOSIS — E1140 Type 2 diabetes mellitus with diabetic neuropathy, unspecified: Secondary | ICD-10-CM | POA: Diagnosis present

## 2020-04-29 DIAGNOSIS — I2510 Atherosclerotic heart disease of native coronary artery without angina pectoris: Secondary | ICD-10-CM | POA: Diagnosis present

## 2020-04-29 DIAGNOSIS — I5030 Unspecified diastolic (congestive) heart failure: Secondary | ICD-10-CM | POA: Diagnosis not present

## 2020-04-29 DIAGNOSIS — I5032 Chronic diastolic (congestive) heart failure: Secondary | ICD-10-CM | POA: Diagnosis present

## 2020-04-29 DIAGNOSIS — R079 Chest pain, unspecified: Secondary | ICD-10-CM | POA: Diagnosis not present

## 2020-04-29 DIAGNOSIS — I130 Hypertensive heart and chronic kidney disease with heart failure and stage 1 through stage 4 chronic kidney disease, or unspecified chronic kidney disease: Secondary | ICD-10-CM | POA: Diagnosis present

## 2020-04-29 DIAGNOSIS — E875 Hyperkalemia: Secondary | ICD-10-CM | POA: Diagnosis not present

## 2020-04-29 DIAGNOSIS — I340 Nonrheumatic mitral (valve) insufficiency: Secondary | ICD-10-CM | POA: Diagnosis not present

## 2020-04-29 DIAGNOSIS — I959 Hypotension, unspecified: Secondary | ICD-10-CM | POA: Diagnosis not present

## 2020-04-29 DIAGNOSIS — Z87891 Personal history of nicotine dependence: Secondary | ICD-10-CM

## 2020-04-29 DIAGNOSIS — Z8249 Family history of ischemic heart disease and other diseases of the circulatory system: Secondary | ICD-10-CM

## 2020-04-29 DIAGNOSIS — R197 Diarrhea, unspecified: Secondary | ICD-10-CM | POA: Diagnosis not present

## 2020-04-29 DIAGNOSIS — K2950 Unspecified chronic gastritis without bleeding: Secondary | ICD-10-CM | POA: Diagnosis not present

## 2020-04-29 DIAGNOSIS — N1831 Chronic kidney disease, stage 3a: Secondary | ICD-10-CM | POA: Diagnosis present

## 2020-04-29 DIAGNOSIS — K2971 Gastritis, unspecified, with bleeding: Secondary | ICD-10-CM | POA: Diagnosis not present

## 2020-04-29 DIAGNOSIS — N1832 Chronic kidney disease, stage 3b: Secondary | ICD-10-CM | POA: Diagnosis not present

## 2020-04-29 DIAGNOSIS — I509 Heart failure, unspecified: Secondary | ICD-10-CM

## 2020-04-29 DIAGNOSIS — F329 Major depressive disorder, single episode, unspecified: Secondary | ICD-10-CM | POA: Diagnosis present

## 2020-04-29 DIAGNOSIS — Z20822 Contact with and (suspected) exposure to covid-19: Secondary | ICD-10-CM | POA: Diagnosis present

## 2020-04-29 DIAGNOSIS — K648 Other hemorrhoids: Secondary | ICD-10-CM | POA: Diagnosis present

## 2020-04-29 DIAGNOSIS — I10 Essential (primary) hypertension: Secondary | ICD-10-CM | POA: Diagnosis present

## 2020-04-29 DIAGNOSIS — Z79899 Other long term (current) drug therapy: Secondary | ICD-10-CM

## 2020-04-29 DIAGNOSIS — I5033 Acute on chronic diastolic (congestive) heart failure: Secondary | ICD-10-CM | POA: Diagnosis not present

## 2020-04-29 DIAGNOSIS — E785 Hyperlipidemia, unspecified: Secondary | ICD-10-CM | POA: Diagnosis present

## 2020-04-29 DIAGNOSIS — Z66 Do not resuscitate: Secondary | ICD-10-CM | POA: Diagnosis present

## 2020-04-29 DIAGNOSIS — R609 Edema, unspecified: Secondary | ICD-10-CM | POA: Diagnosis not present

## 2020-04-29 DIAGNOSIS — M549 Dorsalgia, unspecified: Secondary | ICD-10-CM | POA: Diagnosis present

## 2020-04-29 DIAGNOSIS — E119 Type 2 diabetes mellitus without complications: Secondary | ICD-10-CM

## 2020-04-29 DIAGNOSIS — Z880 Allergy status to penicillin: Secondary | ICD-10-CM

## 2020-04-29 DIAGNOSIS — Z8679 Personal history of other diseases of the circulatory system: Secondary | ICD-10-CM | POA: Diagnosis not present

## 2020-04-29 DIAGNOSIS — Z03818 Encounter for observation for suspected exposure to other biological agents ruled out: Secondary | ICD-10-CM | POA: Diagnosis not present

## 2020-04-29 DIAGNOSIS — I080 Rheumatic disorders of both mitral and aortic valves: Secondary | ICD-10-CM | POA: Diagnosis present

## 2020-04-29 DIAGNOSIS — M5489 Other dorsalgia: Secondary | ICD-10-CM | POA: Diagnosis not present

## 2020-04-29 DIAGNOSIS — I4510 Unspecified right bundle-branch block: Secondary | ICD-10-CM | POA: Diagnosis not present

## 2020-04-29 DIAGNOSIS — I208 Other forms of angina pectoris: Secondary | ICD-10-CM | POA: Diagnosis not present

## 2020-04-29 DIAGNOSIS — D696 Thrombocytopenia, unspecified: Secondary | ICD-10-CM | POA: Diagnosis not present

## 2020-04-29 DIAGNOSIS — I361 Nonrheumatic tricuspid (valve) insufficiency: Secondary | ICD-10-CM | POA: Diagnosis not present

## 2020-04-29 LAB — COMPREHENSIVE METABOLIC PANEL
ALT: 10 U/L (ref 0–44)
AST: 13 U/L — ABNORMAL LOW (ref 15–41)
Albumin: 3.7 g/dL (ref 3.5–5.0)
Alkaline Phosphatase: 42 U/L (ref 38–126)
Anion gap: 8 (ref 5–15)
BUN: 47 mg/dL — ABNORMAL HIGH (ref 8–23)
CO2: 23 mmol/L (ref 22–32)
Calcium: 9.2 mg/dL (ref 8.9–10.3)
Chloride: 110 mmol/L (ref 98–111)
Creatinine, Ser: 1.56 mg/dL — ABNORMAL HIGH (ref 0.44–1.00)
GFR calc Af Amer: 33 mL/min — ABNORMAL LOW (ref >60)
GFR calc non Af Amer: 29 mL/min — ABNORMAL LOW (ref >60)
Glucose, Bld: 158 mg/dL — ABNORMAL HIGH (ref 70–99)
Potassium: 5.2 mmol/L — ABNORMAL HIGH (ref 3.5–5.1)
Sodium: 141 mmol/L (ref 135–145)
Total Bilirubin: 0.6 mg/dL (ref 0.3–1.2)
Total Protein: 6.2 g/dL — ABNORMAL LOW (ref 6.5–8.1)

## 2020-04-29 LAB — PROTIME-INR
INR: 1.1 (ref 0.8–1.2)
Prothrombin Time: 13.6 seconds (ref 11.4–15.2)

## 2020-04-29 LAB — RETICULOCYTES
Immature Retic Fract: 27.4 % — ABNORMAL HIGH (ref 2.3–15.9)
RBC.: 2.4 MIL/uL — ABNORMAL LOW (ref 3.87–5.11)
Retic Count, Absolute: 50.4 10*3/uL (ref 19.0–186.0)
Retic Ct Pct: 2.1 % (ref 0.4–3.1)

## 2020-04-29 LAB — CBC
HCT: 22.5 % — ABNORMAL LOW (ref 36.0–46.0)
Hemoglobin: 7.1 g/dL — ABNORMAL LOW (ref 12.0–15.0)
MCH: 30 pg (ref 26.0–34.0)
MCHC: 31.6 g/dL (ref 30.0–36.0)
MCV: 94.9 fL (ref 80.0–100.0)
Platelets: 175 10*3/uL (ref 150–400)
RBC: 2.37 MIL/uL — ABNORMAL LOW (ref 3.87–5.11)
RDW: 16.2 % — ABNORMAL HIGH (ref 11.5–15.5)
WBC: 9.4 10*3/uL (ref 4.0–10.5)
nRBC: 0 % (ref 0.0–0.2)

## 2020-04-29 LAB — APTT: aPTT: 20 seconds — ABNORMAL LOW (ref 24–36)

## 2020-04-29 MED ORDER — SODIUM CHLORIDE 0.9 % IV SOLN: 10.0000 mL/h | Freq: Once | INTRAVENOUS | Status: DC

## 2020-04-29 NOTE — ED Provider Notes (Addendum)
[facility]-EMERGENCY DEPT Provider Note   CSN: [id number] Arrival date & time: 04/29/20  2137     History Chief Complaint  Patient presents with  . GI Bleeding  . Back Pain    Jennifer [name] is a 86 y.o. female.  86 year old female who presents with rectal bleeding.  Has had symptoms for the past 3 days.  States that she is had bright red blood per rectum and EMS noted that there was 100 cc of bright red blood in the commode.  Does have a prior history of hemorrhoids.  Denies any abdominal discomfort.  Denies any weakness with standing.  No emesis appreciated.  Has also had low back discomfort which is been worse with certain movements.  No radicular symptoms with this.  Does have a history of AAA repair        Past Medical History:  Diagnosis Date  . AAA (abdominal aortic aneurysm)-repaired   . Acute bronchitis   . Anemia   . CHF (congestive heart failure) (HCC)   . Diabetes mellitus without complication (HCC)   . GERD (gastroesophageal reflux disease)   . Hypertension   . Irregular heart beat   . Shortness of breath dyspnea   . Tachycardia    Unspecified    Patient Active Problem List   Diagnosis Date Noted  . Anemia 10/08/2015  . Diastolic CHF (HCC) 10/08/2015  . Gastrointestinal hemorrhage with melena   . Pressure ulcer 04/27/2015  . Lower GI bleed 04/24/2015  . GI bleed 04/24/2015  . Acute respiratory distress 03/31/2015  . Hypertensive urgency 03/31/2015  . Anemia, iron deficiency 03/02/2015  . Klebsiella cystitis 03/02/2015  . Abscess of abdominal wall 02/28/2015  . Abdominal abscess 02/28/2015  . Abdominal wall abscess at site of surgical wound 02/28/2015  . Acute blood loss anemia 10/05/2014  . Thoracic aortic aneurysm (HCC) 09/27/2014  . Dissecting aortic aneurysm (any part), thoracic (HCC) 09/27/2014  . Aneurysm, thoracic aortic (HCC) 09/26/2014  . Hyperlipidemia 09/16/2014  . Diabetes (HCC) 09/13/2014  . CHF (congestive heart  failure) (HCC) 09/13/2014  . HTN (hypertension) 09/13/2014  . Physical deconditioning 09/12/2014  . 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 Acute on chronic diastolic CHF (congestive heart failure), NYHA class 3 (HCC) 09/08/2014  . Left sided chest pain 09/08/2014  . CKD (chronic kidney disease), stage III 09/08/2014  . Acute respiratory failure with hypoxia (HCC) 09/01/2014  . Acute pulmonary edema (HCC) 09/01/2014  . Cardiogenic shock (HCC) 09/01/2014  . Intramural aortic hematoma (HCC) 08/28/2014    Past Surgical History:  Procedure Laterality Date  . ABDOMINAL AORTIC ANEURYSM REPAIR  2012   Dr. Arbie Cookey  . ABDOMINAL HYSTERECTOMY     40 years ago, intestinal damage  . BREAST LUMPECTOMY Left    Benign Mass  . ESOPHAGOGASTRODUODENOSCOPY (EGD) WITH PROPOFOL Left 10/09/2015   Procedure: ESOPHAGOGASTRODUODENOSCOPY (EGD) WITH PROPOFOL;  Surgeon: Willis Modena, MD;  Location: [facility] ENDOSCOPY;  Service: Endoscopy;  Laterality: Left;  . INCISION AND DRAINAGE ABSCESS N/A 02/28/2015   Procedure: INCISION AND DRAINAGE ABSCESS ABDOMINAL WALL;  Surgeon: Claud Kelp, MD;  Location: [facility] OR;  Service: General;  Laterality: N/A;  . IR RADIOLOGIST EVAL & MGMT  04/24/2017  . OOPHORECTOMY     40 years ago  . THORACIC AORTIC ENDOVASCULAR STENT GRAFT N/A 09/27/2014   Procedure: THORACIC AORTIC ENDOVASCULAR STENT GRAFT;  Surgeon: Nada Libman, MD;  Location: [facility] OR;  Service: Vascular;  Laterality: N/A;     OB History  No obstetric history on file.     Family History  Problem Relation Age of Onset  . Heart disease Sister   . Heart attack Mother   . Cancer Brother   . Heart attack Sister   . Heart attack Brother     Social History   Tobacco Use  . Smoking status: Former Smoker    Quit date: 1975    Years since quitting: 46.4  . Smokeless tobacco: Never Used  . Tobacco comment: quit smoking in 1975  Substance Use Topics  . Alcohol use: No  . Drug use: No    Home  Medications Prior to Admission medications   Medication Sig Start Date End Date Taking? Authorizing Provider  amLODipine (NORVASC) 10 MG tablet Take 10 mg by mouth daily.    [provider]  aspirin EC 81 MG tablet Take 81 mg by mouth daily.    [provider]  atorvastatin (LIPITOR) 20 MG tablet Take 20 mg by mouth daily.    [provider]  calcium carbonate (OS-CAL) 600 MG TABS tablet Take 600 mg by mouth 2 (two) times daily with a meal. Takes in the morning and at lunch time    [provider]  [employer] STOOL SOFTENER 100 MG capsule Take 100 mg by mouth daily. 05/31/15   [provider]  ferrous sulfate 325 (65 FE) MG tablet Take 1 tablet (325 mg total) by mouth 2 (two) times daily with a meal. 03/04/15   Tat, Onalee Hua, MD  furosemide (LASIX) 20 MG tablet Take 20 mg by mouth daily.    [provider]  gemfibrozil (LOPID) 600 MG tablet Take 600 mg by mouth 2 (two) times daily. 01/12/15   [provider]  glimepiride (AMARYL) 2 MG tablet Take 2 mg by mouth daily with breakfast.    [provider]  glipiZIDE (GLIPIZIDE XL) 2.5 MG 24 hr tablet Take 1 tablet (2.5 mg total) by mouth daily with breakfast. Patient not taking: Reported on 04/24/2017 04/04/15   Penny Pia, MD  KLOR-CON M20 20 MEQ tablet Take 20 mEq by mouth daily. 06/22/15   [provider]  labetalol (NORMODYNE) 100 MG tablet Take 1 tablet (100 mg total) by mouth 2 (two) times daily. 04/04/15   Penny Pia, MD  Multiple Vitamin (MULTIVITAMIN) tablet Take 1 tablet by mouth daily.    [provider]  naproxen (NAPROSYN) 500 MG tablet  09/14/19   [provider]  Nutritional Supplements (JUICE PLUS FIBRE PO) Take 3 tablets by mouth daily.     [provider]  omeprazole (PRILOSEC) 40 MG capsule Take 40 mg by mouth daily. 03/17/15   [provider]  Polyethyl Glycol-Propyl Glycol (SYSTANE OP) Place 1 drop into both eyes 2 (two) times  daily.     [provider]  traMADol (ULTRAM) 50 MG tablet Take 1 tablet (50 mg total) by mouth every 6 (six) hours as needed. 09/20/19   Hilts, Casimiro Needle, MD    Allergies    Amoxicillin-pot clavulanate, Sulfur, Amoxicillin, Sulfamethoxazole, and Tape  Review of Systems   Review of Systems  All other systems reviewed and are negative.   Physical Exam Updated Vital Signs There were no vitals taken for this visit.  Physical Exam Vitals and nursing note reviewed.  Constitutional:      General: She is not in acute distress.    Appearance: Normal appearance. She is well-developed. She is not toxic-appearing.  HENT:     Head: Normocephalic and  atraumatic.  Eyes:     General: Lids are normal.     Conjunctiva/sclera: Conjunctivae normal.     Pupils: Pupils are equal, round, and reactive to light.  Neck:     Thyroid: No thyroid mass.     Trachea: No tracheal deviation.  Cardiovascular:     Rate and Rhythm: Normal rate and regular rhythm.     Heart sounds: Normal heart sounds. No murmur. No gallop.   Pulmonary:     Effort: Pulmonary effort is normal. No respiratory distress.     Breath sounds: Normal breath sounds. No stridor. No decreased breath sounds, wheezing, rhonchi or rales.  Abdominal:     General: Bowel sounds are normal. There is no distension.     Palpations: Abdomen is soft.     Tenderness: There is no abdominal tenderness. There is no rebound.  Genitourinary:    Rectum: External hemorrhoid present.     Comments: Gross bloody stools noted Musculoskeletal:        General: No tenderness. Normal range of motion.     Cervical back: Normal range of motion and neck supple.       Back:  Skin:    General: Skin is warm and dry.     Findings: No abrasion or rash.  Neurological:     Mental Status: She is alert and oriented to person, place, and time.     GCS: GCS eye subscore is 4. GCS verbal subscore is 5. GCS motor subscore is 6.     Cranial Nerves: No cranial  nerve deficit.     Sensory: No sensory deficit.     Comments: Patient's lower extremities are at her neurological baseline according to patient.  Psychiatric:        Speech: Speech normal.        Behavior: Behavior normal.     ED Results / Procedures / Treatments   Labs (all labs ordered are listed, but only abnormal results are displayed) Labs Reviewed  COMPREHENSIVE METABOLIC PANEL  CBC  POC OCCULT BLOOD, ED  TYPE AND SCREEN    EKG None  Radiology No results found.  Procedures Procedures (including critical care time)  Medications Ordered in ED Medications - No data to display  ED Course  I have reviewed the triage vital signs and the nursing notes.  Pertinent labs & imaging results that were available during my care of the patient were reviewed by me and considered in my medical decision making (see chart for details).    MDM Rules/Calculators/A&P                     Patient's back pain is reproducible.  No concern for involvement of her known repaired AAA. Patient hemoglobin is dropped by 3 g from baseline.  2 units of packed red blood cells ordered.  Patient has been seen by Dr. Paulita Fujita from GI in the past and have consulted his group.  She is hemodynamically stable at this time.  Will admit to the hospitalist service. Final Clinical Impression(s) / ED Diagnoses Final diagnoses:  None    Rx / DC Orders ED Discharge Orders    None       Lacretia Leigh, MD 04/29/20 2308    Lacretia Leigh, MD 04/29/20 [phone]

## 2020-04-29 NOTE — ED Notes (Signed)
Pt is too weak to stand and has back pain. Unable to get orthostatic vital signs

## 2020-04-29 NOTE — H&P (Signed)
Jennifer MusselJanie B Coopersmith ZOX:[id number] DOB: 07/26/1925 DOA: 04/29/2020     PCP: Lorenda IshiharaVaradarajan, Rupashree, MD   Outpatient Specialists:    GI  Dr. Dulce Sellarutlaw  Deboraha Sprang([facility] )   [facility] [place] Patient arrived to ER on 04/29/20 at 2137  Patient coming from: home Lives  With family    Chief Complaint:  Chief Complaint  Patient presents with  . GI Bleeding  . Back Pain    HPI: Jennifer MusselJanie B [name] is a 86 y.o. female with medical history significant of Dm 2, HTN, chronic iron deficiency anemia secondary to blood loss,, chronic diastolic CHF, HLD, osteoporosis, depression AAA status post repair, GERD, known history of severe degenerative disc disease    Presented with rectal bleeding for the past 4 days bright red blood per rectum once EMS arrived and noted about 100 mL of bright red blood in the commode.  Patient with prior history of hemorrhoids and chronic blood loss.  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 She has had some persistent endoleak which later on had to be repaired by interventional radiologist   Hyperlipidemia -  on statins Lipitor Lopid   HTN on Norvasc labetalol   chronic CHF diastolic  - last echo 2106 preserved EF Grade 2 diastolic CHF    DM 2 -  Lab Results  Component Value Date   HGBA1C 5.8 (H) 10/08/2015   on PO meds only     CKD stage III - baseline Cr 1.4 Lab Results  Component Value Date   CREATININE 1.56 (H) 04/29/2020   CREATININE 1.41 (H) 06/17/2016   CREATININE 1.44 (H) 06/06/2016    While in ER: Noted to have significant anemia hemoglobin down to 7.1 Patient continued to have significant lower GI bleed while in emergency department while on commode had a vasovagal episode and drop to pressures in the 60s.  Patient was transferred to a bed put in Trendelenburg started on IV bolus fluids but has arrived and she was started on blood transfusion.  Blood pressure improved rapidly to 144 and currently up to 160 systolic. Patient still complaining of intermittent chest pain but seems to be reproducible by palpation. EKG-show ST depressions in the lateral leads  ER Provider Called:   [facility] unclear if was able to get a hold of Hospitalist was called for admission for bright red blood per rectum lower GI bleed  The following Work up has been ordered so far:  Orders Placed This Encounter  Procedures  . SARS Coronavirus 2 by RT PCR (hospital order, performed in [facility]) Nasopharyngeal Nasopharyngeal Swab  . Comprehensive metabolic panel  . CBC  . Protime-INR  . APTT  . Diet NPO time specified  . Orthostatic Vital signs  . Place X2 Large Bore IV's  . Initiate Carrier Fluid Protocol  . Informed Consent Details: Physician/Practitioner Attestation; Transcribe to consent form and obtain patient signature  . Consult to gastroenterology  ALL PATIENTS BEING ADMITTED/HAVING PROCEDURES NEED COVID-19 SCREENING  . Consult to hospitalist  . Pulse oximetry, continuous  . Type and screen [facility]  . Prepare RBC (crossmatch)     Following Medications were ordered in ER: Medications  0.9 %  sodium chloride infusion (has no administration in time range)        Consult Orders  (From admission, onward)         Start     Ordered   04/29/20 2308  Consult to hospitalist  Once    Provider:  (Not yet assigned)  Question Answer Comment  Place call to: [facility]    Reason for Consult Admit      04/29/20 2307           Significant initial  Findings: Abnormal Labs Reviewed  COMPREHENSIVE METABOLIC PANEL - Abnormal; Notable for the following components:      Result Value   Potassium 5.2 (*)    Glucose, Bld 158 (*)    BUN 47 (*)    Creatinine, Ser 1.56 (*)    Total Protein 6.2 (*)    AST 13 (*)    GFR calc non Af Amer 29 (*)    GFR calc Af Amer 33 (*)    All other components within normal limits  CBC - Abnormal; Notable for the following components:   RBC 2.37 (*)    Hemoglobin 7.1 (*)    HCT 22.5 (*)    RDW 16.2 (*)    All other components within normal limits  APTT - Abnormal; Notable for the following components:   aPTT 20 (*)    All other components within normal limits   Otherwise labs showing:    Recent Labs  Lab 04/29/20 2204  NA 141  K 5.2*  CO2 23  GLUCOSE 158*  BUN 47*  CREATININE 1.56*  CALCIUM 9.2    Cr    Up from baseline see below Lab Results  Component Value Date   CREATININE 1.56 (H) 04/29/2020   CREATININE 1.41 (H) 06/17/2016   CREATININE 1.44 (H) 06/06/2016    Recent Labs  Lab 04/29/20 2204  AST 13*  ALT 10  ALKPHOS 42  BILITOT 0.6  PROT 6.2*  ALBUMIN 3.7   Lab Results  Component Value Date   CALCIUM 9.2 04/29/2020   PHOS 4.2 03/31/2015      WBC      Component Value Date/Time   WBC 9.4 04/29/2020 2204   ANC    Component Value Date/Time   NEUTROABS 7.0 04/24/2015 0600     Plt: Lab Results  Component Value Date   PLT 175 04/29/2020   COVID-19 Labs  Recent Labs    04/29/20 2332  FERRITIN 49     HG/HCT Down  from baseline see below 2018 hemoglobin was 11.3    Component Value Date/Time   HGB 7.1 (L) 04/29/2020 2204   HCT 22.5 (L) 04/29/2020 2204    Troponin  ordered   ECG: Ordered Personally reviewed by me showing: HR : 92 Rhythm:  NSR, RBBB ST depression in lateral leads QTC 526    UA not ordered   ED Triage Vitals  Enc Vitals Group     BP 04/29/20 2200  (!) 153/66     Pulse Rate 04/29/20 2200 82     Resp 04/29/20 2200 15     Temp 04/29/20 2227 98.1 F (36.7 C)     Temp src --  SpO2 04/29/20 2200 97 %     Weight --      Height --      Head Circumference --      Peak Flow --      Pain Score 04/29/20 2157 7     Pain Loc --      Pain Edu? --      Excl. in GC? --   TMAX(24)@       Latest  Blood pressure (!) 144/63, pulse 84, temperature 98.1 F (36.7 C), resp. rate (!) 21, SpO2 93 %.    Review of Systems:    Pertinent positives include: back pain, Bright red blood per rectum  Constitutional:  No weight loss, night sweats, Fevers, chills, fatigue, weight loss  HEENT:  No headaches, Difficulty swallowing,Tooth/dental problems,Sore throat,  No sneezing, itching, ear ache, nasal congestion, post nasal drip,  Cardio-vascular:  No chest pain, Orthopnea, PND, anasarca, dizziness, palpitations.no Bilateral lower extremity swelling  GI:  No heartburn, indigestion, abdominal pain, nausea, vomiting, diarrhea, change in bowel habits, loss of appetite, melena, blood in stool, hematemesis Resp:  no shortness of breath at rest. No dyspnea on exertion, No excess mucus, no productive cough, No non-productive cough, No coughing up of blood.No change in color of mucus.No wheezing. Skin:  no rash or lesions. No jaundice GU:  no dysuria, change in color of urine, no urgency or frequency. No straining to urinate.  No flank pain.  Musculoskeletal:  No joint pain or no joint swelling. No decreased range of motion. No back pain.  Psych:  No change in mood or affect. No depression or anxiety. No memory loss.  Neuro: no localizing neurological complaints, no tingling, no weakness, no double vision, no gait abnormality, no slurred speech, no confusion  All systems reviewed and apart from HOPI all are negative  Past Medical History:   Past Medical History:  Diagnosis Date  . AAA (abdominal aortic aneurysm)-repaired   . Acute bronchitis   .  Anemia   . CHF (congestive heart failure) (HCC)   . Diabetes mellitus without complication (HCC)   . GERD (gastroesophageal reflux disease)   . Hypertension   . Irregular heart beat   . Shortness of breath dyspnea   . Tachycardia    Unspecified     Past Surgical History:  Procedure Laterality Date  . ABDOMINAL AORTIC ANEURYSM REPAIR  2012   Dr. Arbie Cookey  . ABDOMINAL HYSTERECTOMY     40 years ago, intestinal damage  . BREAST LUMPECTOMY Left    Benign Mass  . ESOPHAGOGASTRODUODENOSCOPY (EGD) WITH PROPOFOL Left 10/09/2015   Procedure: ESOPHAGOGASTRODUODENOSCOPY (EGD) WITH PROPOFOL;  Surgeon: Willis Modena, MD;  [facility] ENDOSCOPY;  Service: Endoscopy;  Laterality: Left;  . INCISION AND DRAINAGE ABSCESS N/A 02/28/2015   Procedure: INCISION AND DRAINAGE ABSCESS ABDOMINAL WALL;  Surgeon: Claud Kelp, MD;  Location: [facility] OR;  Service: General;  Laterality: N/A;  . IR RADIOLOGIST EVAL & MGMT  04/24/2017  . OOPHORECTOMY     40 years ago  . THORACIC AORTIC ENDOVASCULAR STENT GRAFT N/A 09/27/2014   Procedure: THORACIC AORTIC ENDOVASCULAR STENT GRAFT;  Surgeon: Nada Libman, MD;  Location[facility] OR;  Service: Vascular;  Laterality: N/A;    Social History:  Ambulatory       reports that she quit smoking about 46 years ago. She has never used smokeless tobacco. She reports that she does not drink alcohol or use drugs.     Family History:   Family History  Problem Relation Age of Onset  . Heart disease Sister   . Heart attack Mother   . Cancer Brother   . Heart attack Sister   . Heart attack Brother     Allergies: Allergies  Allergen Reactions  . Amoxicillin-Pot Clavulanate Diarrhea  . Sulfur     Unknown reaction   . Amoxicillin Rash  . Sulfamethoxazole Rash  . Tape Rash and Other (See Comments)    "paper" tape please, adhesive tape "burns'     Prior to Admission medications   Medication Sig Start Date End Date Taking? Authorizing Provider  amLODipine (NORVASC) 10 MG  tablet Take 10 mg by mouth daily.    [provider]  aspirin EC 81 MG tablet Take 81 mg by mouth daily.    [provider]  atorvastatin (LIPITOR) 20 MG tablet Take 20 mg by mouth daily.    [provider]  calcium carbonate (OS-CAL) 600 MG TABS tablet Take 600 mg by mouth 2 (two) times daily with a meal. Takes in the morning and at lunch time    [provider]  [employer] STOOL SOFTENER 100 MG capsule Take 100 mg by mouth daily. 05/31/15   [provider]  ferrous sulfate 325 (65 FE) MG tablet Take 1 tablet (325 mg total) by mouth 2 (two) times daily with a meal. 03/04/15   Tat, Onalee Hua, MD  furosemide (LASIX) 20 MG tablet Take 20 mg by mouth daily.    [provider]  gemfibrozil (LOPID) 600 MG tablet Take 600 mg by mouth 2 (two) times daily. 01/12/15   [provider]  glimepiride (AMARYL) 2 MG tablet Take 2 mg by mouth daily with breakfast.    [provider]  glipiZIDE (GLIPIZIDE XL) 2.5 MG 24 hr tablet Take 1 tablet (2.5 mg total) by mouth daily with breakfast. Patient not taking: Reported on 04/24/2017 04/04/15   Penny Pia, MD  KLOR-CON M20 20 MEQ tablet Take 20 mEq by mouth daily. 06/22/15   [provider]  labetalol (NORMODYNE) 100 MG tablet Take 1 tablet (100 mg total) by mouth 2 (two) times daily. 04/04/15   Penny Pia, MD  Multiple Vitamin (MULTIVITAMIN) tablet Take 1 tablet by mouth daily.    [provider]  naproxen (NAPROSYN) 500 MG tablet  09/14/19   [provider]  Nutritional Supplements (JUICE PLUS FIBRE PO) Take 3 tablets by mouth daily.     [provider]  omeprazole (PRILOSEC) 40 MG capsule Take 40 mg by mouth daily. 03/17/15   [provider]  Polyethyl Glycol-Propyl Glycol (SYSTANE OP) Place 1 drop into both eyes 2 (two) times daily.     [provider]  traMADol (ULTRAM) 50 MG tablet Take 1 tablet (50 mg total) by mouth every 6 (six) hours as needed.  09/20/19   [place], Casimiro Needle, MD   Physical Exam: Blood pressure (!) 144/63, pulse 84, temperature 98.1 F (36.7 C), resp. rate (!) 21, SpO2 93 %. 1. General:  in  Acute distress increased work of breathing sweating  Chronically ill  -appearing 2. Psychological: Alert and Oriented 3. Head/ENT:  Dry Mucous Membranes                          Head Non traumatic, neck supple                          Poor Dentition , pale appearing 4.  SKIN:   decreased Skin turgor,  Skin clean Dry and intact no rash  5. Heart: Regular rate and rhythm no  Murmur, no Rub or gallop 6. Lungs: no wheezes or crackles   7. Abdomen: Soft, non-tender, Non distended  bowel sounds present 8. Lower extremities: no clubbing, cyanosis, no   edema 9. Neurologically Grossly intact, moving all 4 extremities equally   10. MSK: Normal range of motion   All other LABS:     Recent Labs  Lab 04/29/20 2204  WBC 9.4  HGB 7.1*  HCT 22.5*  MCV 94.9  PLT 175     Recent Labs  Lab 04/29/20 2204  NA 141  K 5.2*  CL 110  CO2 23  GLUCOSE 158*  BUN 47*  CREATININE 1.56*  CALCIUM 9.2     Recent Labs  Lab 04/29/20 2204  AST 13*  ALT 10  ALKPHOS 42  BILITOT 0.6  PROT 6.2*  ALBUMIN 3.7       Cultures:    Component Value Date/Time   SDES URINE, CLEAN CATCH 10/08/2015 1044   SPECREQUEST NONE 10/08/2015 1044   CULT >=100,000 COLONIES/mL KLEBSIELLA OXYTOCA 10/08/2015 1044   REPTSTATUS 10/10/2015 FINAL 10/08/2015 1044     Radiological Exams on Admission: No results found.  Chart has been reviewed    Assessment/Plan   86 y.o. female with medical history significant of Dm 2, HTN, chronic iron deficiency anemia secondary to blood loss,, chronic diastolic CHF, HLD, osteoporosis, depression AAA status post repair, GERD, known history of severe degenerative disc disease Admitted for lower gi bleed  Present on Admission: . Lower GI bleed - - Suspect Lower Gi source  No hx of PUD, no melena,   to  suggest  otherwise  - Admit  For further management given:   Age >60 years,  comorbid illnesses  ,  hemodynamic instability,  gross rectal bleeding,  re bleeding  - most likely Diverticular source -  painless bleeding making colitis less likely      -  hemodynamic instability present      -  Admit to stepdown given above    -  ER  Provider and myself  consult  gastroenterology ( [facility]) spke to Dr. Ernie Avena  - serial CBC.    - Monitor for any recurrence,  evidence of hemodynamic instability or significant blood loss -  type and screen,  - Transfuse 2 units given evidence of significant  bleeding  - Establish at least 2 PIV and fluid resuscitate    keep nothing by mouth    -  monitor for Recurrent significant  Bleeding of red blood and hemodynamic instability in which case Bleeding scan stat  Otherwise bleeding scan can be done in AM if still bleeding  discussed case with IR consult they are aware of the patient Given CKD not a good candidate for contrasted study   . CKD (chronic kidney disease), stage III -avoid nephrotoxic medications Follow electrolytes after being rehydrated  . HTN (hypertension) -hold home medication   . Hyperlipidemia -stable continue home medication   . Acute blood loss anemia -transfuse 2 units and repeat CBC transfuse as needed   . Diastolic CHF ([place]) -gently rehydrate appears to be fluid down   . Chest pain -cycle cardiac enzymes monitor repeat EKG.  Could be demand ischemia in the setting of GI blood loss versus musculoskeletal since chest pain is reproducible by palpation but EKG does show some ischemic changes more consistent with demand Obtain echo  in the morning  . Abnormal ECG -cycle cardiac enzymes and obtain echo in the morning . Hyperkalemia -mild we will rehydrate  . Prolonged QT interval - - will monitor on tele avoid QT prolonging medications, rehydrate correct electrolytes  Back pain - appears musculoskeletal at this time,  Pt with hx of sevre  DDD   Dm 2 -  - Order Sensitive  SSI     -  check TSH and HgA1C  - Hold by mouth medications     Other plan as per orders.  DVT prophylaxis:  SCD    Code Status:  FULL CODE  as per patient  I had personally discussed CODE STATUS with patient  Overall guarded prognosis admit to stepdown patient has high risk for decompensation  Family Communication:   Family not at  Bedside    Disposition Plan:         To home once workup is complete and patient is stable   Following barriers for discharge:                            Electrolytes corrected                             Anemia corrected Gi bleeding resolved                             Will need to be able to tolerate PO                                                      Will need consultants to evaluate patient prior to discharge                      Would benefit from PT/OT eval prior to DC  Order once stable                                      Consults called:   [facility] is aware will see in AM  Notified IR on call in case pt need emergent study  Admission status:  ED Disposition    ED Disposition Condition Comment   Admit  [facility]ospital [facility] [[id number]]  Level of Care: Stepdown [14]  Admit to SDU based on following criteria: Other see comments  Comments: gi bleed  May admit patient to [facility] or [facility] if equivalent level of care is available:: No  Covid Evaluation: Asymptomatic Screening Protocol (No Symptoms)  Diagnosis: Lower GI bleed [850277]  Admitting Physician: Therisa Doyne [[id number]]  Attending Physician: Therisa Doyne [[id number]]  Estimated length of stay: past midnight tomorrow  Certification:: I certify this patient will need inpatient services for at least 2 midnights          inpatient     I Expect 2 midnight stay secondary to severity of patient's current illness need for inpatient interventions justified by the following:  hemodynamic instability despite  optimal treatment ( hypotension )  Severe lab/radiological/exam abnormalities including:  Symptomatic anemia   and extensive comorbidities including:    DM2  CHF  CAD   CKD  That are currently affecting medical management.   I expect  patient to be hospitalized for 2 midnights requiring inpatient medical care.  Patient is at high risk for adverse outcome (such as loss of life or disability) if not treated.  Indication for inpatient stay as follows:  Severe change from baseline regarding mental status Hemodynamic instability despite maximal medical therapy,  ongoing suicidal ideations,  severe pain requiring acute inpatient management,  inability to maintain oral hydration   persistent chest pain despite medical management Need for operative/procedural  intervention New or worsening hypoxia  Need for IV antibiotics, IV fluids, IV rate controling medications, IV antihypertensives, IV pain medications, IV anticoagulation    Level of care         SDU tele indefinitely please discontinue once patient no longer qualifies   Precautions: admitted as  asymptomatic screening protocol    PPE: Used by the provider:   P100  eye Goggles,  Gloves    Darcus Edds 04/29/2020, 1:31 AM    [facility]     after 2 AM please page floor coverage PA If 7AM-7PM, please contact the day team taking care of the patient using [email or web address]   Patient was evaluated in the context of the global COVID-19 pandemic, which necessitated consideration that the patient might be at risk for infection with the SARS-CoV-2 virus that causes COVID-19. Institutional protocols and algorithms that pertain to the evaluation of patients at risk for COVID-19 are in a state of rapid change based on information released by regulatory bodies including the CDC and federal and state organizations. These policies and algorithms were followed during the patient's care.

## 2020-04-29 NOTE — ED Notes (Signed)
While getting pt to bedside commode, pt began c/o "not feeling good". Obtained v/s at the time and recorded in chart. RN, NT and Dr.Doutova aware.  Pt still on bedside commode with Dr.Doutova at bedside and call light within reach.

## 2020-04-30 ENCOUNTER — Inpatient Hospital Stay (HOSPITAL_COMMUNITY): Payer: Medicare HMO

## 2020-04-30 DIAGNOSIS — F329 Major depressive disorder, single episode, unspecified: Secondary | ICD-10-CM | POA: Diagnosis present

## 2020-04-30 DIAGNOSIS — I361 Nonrheumatic tricuspid (valve) insufficiency: Secondary | ICD-10-CM

## 2020-04-30 DIAGNOSIS — N179 Acute kidney failure, unspecified: Secondary | ICD-10-CM | POA: Diagnosis present

## 2020-04-30 DIAGNOSIS — Z95828 Presence of other vascular implants and grafts: Secondary | ICD-10-CM

## 2020-04-30 DIAGNOSIS — E785 Hyperlipidemia, unspecified: Secondary | ICD-10-CM | POA: Diagnosis present

## 2020-04-30 DIAGNOSIS — D62 Acute posthemorrhagic anemia: Secondary | ICD-10-CM | POA: Diagnosis present

## 2020-04-30 DIAGNOSIS — N183 Chronic kidney disease, stage 3 unspecified: Secondary | ICD-10-CM | POA: Diagnosis not present

## 2020-04-30 DIAGNOSIS — D509 Iron deficiency anemia, unspecified: Secondary | ICD-10-CM | POA: Diagnosis present

## 2020-04-30 DIAGNOSIS — R079 Chest pain, unspecified: Secondary | ICD-10-CM | POA: Diagnosis present

## 2020-04-30 DIAGNOSIS — Z8249 Family history of ischemic heart disease and other diseases of the circulatory system: Secondary | ICD-10-CM | POA: Diagnosis not present

## 2020-04-30 DIAGNOSIS — Z79899 Other long term (current) drug therapy: Secondary | ICD-10-CM | POA: Diagnosis not present

## 2020-04-30 DIAGNOSIS — K5731 Diverticulosis of large intestine without perforation or abscess with bleeding: Secondary | ICD-10-CM | POA: Diagnosis present

## 2020-04-30 DIAGNOSIS — I248 Other forms of acute ischemic heart disease: Secondary | ICD-10-CM | POA: Diagnosis present

## 2020-04-30 DIAGNOSIS — E1140 Type 2 diabetes mellitus with diabetic neuropathy, unspecified: Secondary | ICD-10-CM | POA: Diagnosis present

## 2020-04-30 DIAGNOSIS — K922 Gastrointestinal hemorrhage, unspecified: Secondary | ICD-10-CM | POA: Diagnosis present

## 2020-04-30 DIAGNOSIS — I5030 Unspecified diastolic (congestive) heart failure: Secondary | ICD-10-CM | POA: Diagnosis not present

## 2020-04-30 DIAGNOSIS — R609 Edema, unspecified: Secondary | ICD-10-CM | POA: Diagnosis not present

## 2020-04-30 DIAGNOSIS — I130 Hypertensive heart and chronic kidney disease with heart failure and stage 1 through stage 4 chronic kidney disease, or unspecified chronic kidney disease: Secondary | ICD-10-CM

## 2020-04-30 DIAGNOSIS — Z888 Allergy status to other drugs, medicaments and biological substances status: Secondary | ICD-10-CM | POA: Diagnosis not present

## 2020-04-30 DIAGNOSIS — I340 Nonrheumatic mitral (valve) insufficiency: Secondary | ICD-10-CM

## 2020-04-30 DIAGNOSIS — Z880 Allergy status to penicillin: Secondary | ICD-10-CM | POA: Diagnosis not present

## 2020-04-30 DIAGNOSIS — N1831 Chronic kidney disease, stage 3a: Secondary | ICD-10-CM | POA: Diagnosis present

## 2020-04-30 DIAGNOSIS — I5032 Chronic diastolic (congestive) heart failure: Secondary | ICD-10-CM | POA: Diagnosis present

## 2020-04-30 DIAGNOSIS — Z66 Do not resuscitate: Secondary | ICD-10-CM | POA: Diagnosis present

## 2020-04-30 DIAGNOSIS — Z8679 Personal history of other diseases of the circulatory system: Secondary | ICD-10-CM | POA: Diagnosis not present

## 2020-04-30 DIAGNOSIS — R9431 Abnormal electrocardiogram [ECG] [EKG]: Secondary | ICD-10-CM | POA: Diagnosis not present

## 2020-04-30 DIAGNOSIS — M549 Dorsalgia, unspecified: Secondary | ICD-10-CM

## 2020-04-30 DIAGNOSIS — K219 Gastro-esophageal reflux disease without esophagitis: Secondary | ICD-10-CM | POA: Diagnosis present

## 2020-04-30 DIAGNOSIS — Z7984 Long term (current) use of oral hypoglycemic drugs: Secondary | ICD-10-CM | POA: Diagnosis not present

## 2020-04-30 DIAGNOSIS — E875 Hyperkalemia: Secondary | ICD-10-CM | POA: Diagnosis present

## 2020-04-30 DIAGNOSIS — M810 Age-related osteoporosis without current pathological fracture: Secondary | ICD-10-CM | POA: Diagnosis present

## 2020-04-30 DIAGNOSIS — I208 Other forms of angina pectoris: Secondary | ICD-10-CM | POA: Diagnosis not present

## 2020-04-30 DIAGNOSIS — E1122 Type 2 diabetes mellitus with diabetic chronic kidney disease: Secondary | ICD-10-CM | POA: Diagnosis present

## 2020-04-30 DIAGNOSIS — I4581 Long QT syndrome: Secondary | ICD-10-CM

## 2020-04-30 DIAGNOSIS — Z7982 Long term (current) use of aspirin: Secondary | ICD-10-CM | POA: Diagnosis not present

## 2020-04-30 DIAGNOSIS — Z882 Allergy status to sulfonamides status: Secondary | ICD-10-CM | POA: Diagnosis not present

## 2020-04-30 DIAGNOSIS — Z20822 Contact with and (suspected) exposure to covid-19: Secondary | ICD-10-CM | POA: Diagnosis present

## 2020-04-30 HISTORY — PX: IR ANGIOGRAM VISCERAL SELECTIVE: IMG657

## 2020-04-30 HISTORY — PX: IR US GUIDE VASC ACCESS RIGHT: IMG2390

## 2020-04-30 LAB — CBC WITH DIFFERENTIAL/PLATELET
Abs Immature Granulocytes: 0.05 10*3/uL (ref 0.00–0.07)
Basophils Absolute: 0 10*3/uL (ref 0.0–0.1)
Basophils Relative: 0 %
Eosinophils Absolute: 0 10*3/uL (ref 0.0–0.5)
Eosinophils Relative: 1 %
HCT: 23.7 % — ABNORMAL LOW (ref 36.0–46.0)
Hemoglobin: 7.5 g/dL — ABNORMAL LOW (ref 12.0–15.0)
Immature Granulocytes: 1 %
Lymphocytes Relative: 31 %
Lymphs Abs: 2.5 10*3/uL (ref 0.7–4.0)
MCH: 29.5 pg (ref 26.0–34.0)
MCHC: 31.6 g/dL (ref 30.0–36.0)
MCV: 93.3 fL (ref 80.0–100.0)
Monocytes Absolute: 0.5 10*3/uL (ref 0.1–1.0)
Monocytes Relative: 6 %
Neutro Abs: 5.1 10*3/uL (ref 1.7–7.7)
Neutrophils Relative %: 61 %
Platelets: 122 10*3/uL — ABNORMAL LOW (ref 150–400)
RBC: 2.54 MIL/uL — ABNORMAL LOW (ref 3.87–5.11)
RDW: 16.1 % — ABNORMAL HIGH (ref 11.5–15.5)
WBC: 8.2 10*3/uL (ref 4.0–10.5)
nRBC: 0 % (ref 0.0–0.2)

## 2020-04-30 LAB — CBC
HCT: 26.2 % — ABNORMAL LOW (ref 36.0–46.0)
HCT: 24.8 % — ABNORMAL LOW (ref 36.0–46.0)
Hemoglobin: 8.4 g/dL — ABNORMAL LOW (ref 12.0–15.0)
Hemoglobin: 7.9 g/dL — ABNORMAL LOW (ref 12.0–15.0)
MCH: 29.6 pg (ref 26.0–34.0)
MCH: 29.7 pg (ref 26.0–34.0)
MCHC: 32.1 g/dL (ref 30.0–36.0)
MCHC: 31.9 g/dL (ref 30.0–36.0)
MCV: 92.3 fL (ref 80.0–100.0)
MCV: 93.2 fL (ref 80.0–100.0)
Platelets: 102 10*3/uL — ABNORMAL LOW (ref 150–400)
Platelets: 93 10*3/uL — ABNORMAL LOW (ref 150–400)
RBC: 2.84 MIL/uL — ABNORMAL LOW (ref 3.87–5.11)
RBC: 2.66 MIL/uL — ABNORMAL LOW (ref 3.87–5.11)
RDW: 16.4 % — ABNORMAL HIGH (ref 11.5–15.5)
RDW: 16.7 % — ABNORMAL HIGH (ref 11.5–15.5)
WBC: 10.2 10*3/uL (ref 4.0–10.5)
WBC: 8.9 10*3/uL (ref 4.0–10.5)
nRBC: 0 % (ref 0.0–0.2)
nRBC: 0 % (ref 0.0–0.2)

## 2020-04-30 LAB — IRON AND TIBC
Iron: 41 ug/dL (ref 28–170)
Saturation Ratios: 13 % (ref 10.4–31.8)
TIBC: 318 ug/dL (ref 250–450)
UIBC: 277 ug/dL

## 2020-04-30 LAB — ECHOCARDIOGRAM COMPLETE
Height: 63 in
Weight: 2250.46 oz

## 2020-04-30 LAB — COMPREHENSIVE METABOLIC PANEL
ALT: 9 U/L (ref 0–44)
AST: 11 U/L — ABNORMAL LOW (ref 15–41)
Albumin: 3.1 g/dL — ABNORMAL LOW (ref 3.5–5.0)
Alkaline Phosphatase: 28 U/L — ABNORMAL LOW (ref 38–126)
Anion gap: 8 (ref 5–15)
BUN: 45 mg/dL — ABNORMAL HIGH (ref 8–23)
CO2: 20 mmol/L — ABNORMAL LOW (ref 22–32)
Calcium: 8.2 mg/dL — ABNORMAL LOW (ref 8.9–10.3)
Chloride: 114 mmol/L — ABNORMAL HIGH (ref 98–111)
Creatinine, Ser: 1.42 mg/dL — ABNORMAL HIGH (ref 0.44–1.00)
GFR calc Af Amer: 37 mL/min — ABNORMAL LOW (ref >60)
GFR calc non Af Amer: 32 mL/min — ABNORMAL LOW (ref >60)
Glucose, Bld: 136 mg/dL — ABNORMAL HIGH (ref 70–99)
Potassium: 4.5 mmol/L (ref 3.5–5.1)
Sodium: 142 mmol/L (ref 135–145)
Total Bilirubin: 0.7 mg/dL (ref 0.3–1.2)
Total Protein: 5.1 g/dL — ABNORMAL LOW (ref 6.5–8.1)

## 2020-04-30 LAB — TROPONIN I (HIGH SENSITIVITY)
Troponin I (High Sensitivity): 252 ng/L (ref <18)
Troponin I (High Sensitivity): 366 ng/L (ref <18)

## 2020-04-30 LAB — URINALYSIS, ROUTINE W REFLEX MICROSCOPIC
Bilirubin Urine: NEGATIVE
Glucose, UA: NEGATIVE mg/dL
Hgb urine dipstick: NEGATIVE
Ketones, ur: NEGATIVE mg/dL
Nitrite: NEGATIVE
Protein, ur: NEGATIVE mg/dL
Specific Gravity, Urine: 1.014 (ref 1.005–1.030)
WBC, UA: >50 WBC/hpf — ABNORMAL HIGH (ref 0–5)
pH: 5 (ref 5.0–8.0)

## 2020-04-30 LAB — MAGNESIUM: Magnesium: 2.2 mg/dL (ref 1.7–2.4)

## 2020-04-30 LAB — MRSA PCR SCREENING: MRSA by PCR: NEGATIVE

## 2020-04-30 LAB — SARS CORONAVIRUS 2 BY RT PCR (HOSPITAL ORDER, PERFORMED IN ~~LOC~~ HOSPITAL LAB): SARS Coronavirus 2: NEGATIVE

## 2020-04-30 LAB — VITAMIN B12: Vitamin B-12: 651 pg/mL (ref 180–914)

## 2020-04-30 LAB — TSH: TSH: 0.959 u[IU]/mL (ref 0.350–4.500)

## 2020-04-30 LAB — HEMOGLOBIN A1C
Hgb A1c MFr Bld: 6.3 % — ABNORMAL HIGH (ref 4.8–5.6)
Mean Plasma Glucose: 134.11 mg/dL

## 2020-04-30 LAB — GLUCOSE, CAPILLARY
Glucose-Capillary: 117 mg/dL — ABNORMAL HIGH (ref 70–99)
Glucose-Capillary: 113 mg/dL — ABNORMAL HIGH (ref 70–99)
Glucose-Capillary: 91 mg/dL (ref 70–99)
Glucose-Capillary: 93 mg/dL (ref 70–99)

## 2020-04-30 LAB — PHOSPHORUS: Phosphorus: 4.3 mg/dL (ref 2.5–4.6)

## 2020-04-30 LAB — SODIUM, URINE, RANDOM: Sodium, Ur: 52 mmol/L

## 2020-04-30 LAB — FERRITIN: Ferritin: 49 ng/mL (ref 11–307)

## 2020-04-30 LAB — PREPARE RBC (CROSSMATCH)

## 2020-04-30 LAB — CREATININE, URINE, RANDOM: Creatinine, Urine: 81.47 mg/dL

## 2020-04-30 LAB — FOLATE: Folate: 41.2 ng/mL (ref >5.9)

## 2020-04-30 MED ORDER — SODIUM CHLORIDE 0.9 % IV BOLUS
500.0000 mL | Freq: Once | INTRAVENOUS | Status: AC
  Administered 2020-04-30: 500 mL via INTRAVENOUS

## 2020-04-30 MED ORDER — PANTOPRAZOLE SODIUM 40 MG IV SOLR
40.0000 mg | Freq: Two times a day (BID) | INTRAVENOUS | Status: DC
  Administered 2020-04-30 (×2): 40 mg via INTRAVENOUS
  Filled 2020-04-30 (×2): qty 40

## 2020-04-30 MED ORDER — SODIUM CHLORIDE 0.9% IV SOLUTION: Freq: Once | INTRAVENOUS | Status: DC

## 2020-04-30 MED ORDER — TECHNETIUM TC 99M-LABELED RED BLOOD CELLS IV KIT
20.1000 | PACK | Freq: Once | INTRAVENOUS | Status: AC
  Administered 2020-04-30: 20.1 via INTRAVENOUS

## 2020-04-30 MED ORDER — MIDAZOLAM HCL 2 MG/2ML IJ SOLN
INTRAMUSCULAR | Status: AC | PRN
  Administered 2020-04-30 (×2): 0.5 mg via INTRAVENOUS
  Administered 2020-04-30: 1 mg via INTRAVENOUS
  Administered 2020-04-30: 0.5 mg via INTRAVENOUS

## 2020-04-30 MED ORDER — LIDOCAINE HCL 1 % IJ SOLN
INTRAMUSCULAR | Status: AC
  Filled 2020-04-30: qty 20

## 2020-04-30 MED ORDER — IODIXANOL 320 MG/ML IV SOLN
50.0000 mL | Freq: Once | INTRAVENOUS | Status: AC | PRN
  Administered 2020-04-30: 32 mL via INTRA_ARTERIAL

## 2020-04-30 MED ORDER — SODIUM CHLORIDE 0.9 % IV SOLN: INTRAVENOUS | Status: AC

## 2020-04-30 MED ORDER — MIDAZOLAM HCL 2 MG/2ML IJ SOLN
INTRAMUSCULAR | Status: AC
  Filled 2020-04-30: qty 2

## 2020-04-30 MED ORDER — SODIUM CHLORIDE 0.9 % IV SOLN: INTRAVENOUS | Status: DC

## 2020-04-30 MED ORDER — ATORVASTATIN CALCIUM 10 MG PO TABS: 20.0000 mg | ORAL_TABLET | Freq: Every day | ORAL | Status: DC

## 2020-04-30 MED ORDER — INSULIN ASPART 100 UNIT/ML ~~LOC~~ SOLN
0.0000 [IU] | SUBCUTANEOUS | Status: DC
  Administered 2020-05-01: 1 [IU] via SUBCUTANEOUS

## 2020-04-30 MED ORDER — ACETAMINOPHEN 650 MG RE SUPP: 650.0000 mg | Freq: Four times a day (QID) | RECTAL | Status: DC | PRN

## 2020-04-30 MED ORDER — HYDRALAZINE HCL 20 MG/ML IJ SOLN
5.0000 mg | Freq: Once | INTRAMUSCULAR | Status: AC | PRN
  Administered 2020-05-01: 5 mg via INTRAVENOUS
  Filled 2020-04-30: qty 1

## 2020-04-30 MED ORDER — ACETAMINOPHEN 325 MG PO TABS
650.0000 mg | ORAL_TABLET | Freq: Four times a day (QID) | ORAL | Status: DC | PRN
  Administered 2020-05-01: 650 mg via ORAL
  Filled 2020-04-30: qty 2

## 2020-04-30 MED ORDER — FENTANYL CITRATE (PF) 100 MCG/2ML IJ SOLN
INTRAMUSCULAR | Status: AC | PRN
  Administered 2020-04-30: 50 ug via INTRAVENOUS
  Administered 2020-04-30 (×2): 25 ug via INTRAVENOUS

## 2020-04-30 MED ORDER — IODIXANOL 320 MG/ML IV SOLN
50.0000 mL | Freq: Once | INTRAVENOUS | Status: AC | PRN
  Administered 2020-04-30: 35 mL via INTRA_ARTERIAL

## 2020-04-30 MED ORDER — CHLORHEXIDINE GLUCONATE CLOTH 2 % EX PADS
6.0000 | MEDICATED_PAD | Freq: Every day | CUTANEOUS | Status: DC
  Administered 2020-04-30: 6 via TOPICAL

## 2020-04-30 MED ORDER — NITROGLYCERIN IN D5W 100-5 MCG/ML-% IV SOLN
INTRAVENOUS | Status: AC
  Filled 2020-04-30: qty 250

## 2020-04-30 MED ORDER — PEG 3350-KCL-NA BICARB-NACL 420 G PO SOLR
4000.0000 mL | Freq: Once | ORAL | Status: AC
  Administered 2020-04-30: 4000 mL via ORAL
  Filled 2020-04-30: qty 4000

## 2020-04-30 MED ORDER — ORAL CARE MOUTH RINSE
15.0000 mL | Freq: Two times a day (BID) | OROMUCOSAL | Status: DC
  Administered 2020-04-30 – 2020-05-03 (×5): 15 mL via OROMUCOSAL

## 2020-04-30 MED ORDER — FENTANYL CITRATE (PF) 100 MCG/2ML IJ SOLN
INTRAMUSCULAR | Status: AC
  Filled 2020-04-30: qty 2

## 2020-04-30 MED ORDER — NITROGLYCERIN 1 MG/10 ML FOR IR/CATH LAB
INTRA_ARTERIAL | Status: AC | PRN
  Administered 2020-04-30: 100 ug via INTRA_ARTERIAL

## 2020-04-30 MED ORDER — SODIUM CHLORIDE 0.9% FLUSH
3.0000 mL | Freq: Two times a day (BID) | INTRAVENOUS | Status: DC
  Administered 2020-04-30 – 2020-05-04 (×8): 3 mL via INTRAVENOUS

## 2020-04-30 MED ORDER — LIDOCAINE 5 % EX OINT
TOPICAL_OINTMENT | Freq: Three times a day (TID) | CUTANEOUS | Status: DC
  Filled 2020-04-30 (×2): qty 35.44

## 2020-04-30 NOTE — Consult Note (Signed)
Referring Provider:  ED Primary Care Physician:  Leeroy Cha, MD Primary Gastroenterologist:  Dr. Paulita Fujita   Reason for Consultation: GI bleed  HPI: Jennifer [name] is a 86 y.o. female with past medical history of abdominal aortic aneurysm repair, history of CHF and diabetes and GERD presented to the hospital yesterday with GI bleed and abdominal pain.  GI is consulted for further evaluation.  Initial evaluation showed hemoglobin of 7.1, mild elevated creatinine, normal LFTs, normal INR.  Normal iron studies.  Normal vitamin B12.  Patient seen and examined at bedside.  According to her, she started noticing rectal bleeding around 4 days ago.  Multiple bloody bowel movements per day.  Subsequently started noticing rectal pain with each bowel movement.  Denies any abdominal pain.  Denies nausea vomiting.  Complaining of fatigue and weakness.  Denied similar bleeding episodes in the past.  Last colonoscopy around 5 to 10 years ago was normal.  No family history of colon cancer  Past Medical History:  Diagnosis Date  . AAA (abdominal aortic aneurysm)-repaired   . Acute bronchitis   . Anemia   . CHF (congestive heart failure) ([place])   . Diabetes mellitus without complication ([place])   . GERD (gastroesophageal reflux disease)   . Hypertension   . Irregular heart beat   . Shortness of breath dyspnea   . Tachycardia    Unspecified    Past Surgical History:  Procedure Laterality Date  . ABDOMINAL AORTIC ANEURYSM REPAIR  2012   Dr. Donnetta Hutching  . ABDOMINAL HYSTERECTOMY     40 years ago, intestinal damage  . BREAST LUMPECTOMY Left    Benign Mass  . ESOPHAGOGASTRODUODENOSCOPY (EGD) WITH PROPOFOL Left 10/09/2015   Procedure: ESOPHAGOGASTRODUODENOSCOPY (EGD) WITH PROPOFOL;  Surgeon: Arta Silence, MD;  Location: [facility] ENDOSCOPY;  Service: Endoscopy;  Laterality: Left;  . INCISION AND DRAINAGE ABSCESS N/A 02/28/2015   Procedure: INCISION AND DRAINAGE ABSCESS ABDOMINAL WALL;  Surgeon: Fanny Skates, MD;  Location: [place];  Service: General;  Laterality: N/A;  . IR RADIOLOGIST EVAL & MGMT  04/24/2017  . OOPHORECTOMY     40 years ago  . THORACIC AORTIC ENDOVASCULAR STENT GRAFT N/A 09/27/2014   Procedure: THORACIC AORTIC ENDOVASCULAR STENT GRAFT;  Surgeon: Serafina Mitchell, MD;  Location: [place];  Service: Vascular;  Laterality: N/A;    Prior to Admission medications   Medication Sig Start Date End Date Taking? Authorizing Provider  Cholecalciferol (VITAMIN D-3) 125 MCG (5000 UT) TABS Take 5,000 Units by mouth daily.   Yes [provider]  furosemide (LASIX) 20 MG tablet Take 20 mg by mouth 2 (two) times daily.    Yes [provider]  KLOR-CON M20 20 MEQ tablet Take 20 mEq by mouth daily. 06/22/15  Yes [provider]  labetalol (NORMODYNE) 100 MG tablet Take 1 tablet (100 mg total) by mouth 2 (two) times daily. 04/04/15  Yes Velvet Bathe, MD  Nutritional Supplements (JUICE PLUS FIBRE PO) Take 3 tablets by mouth daily.    Yes [provider]  omeprazole (PRILOSEC) 40 MG capsule Take 40 mg by mouth daily. 03/17/15  Yes [provider]  vitamin B-12 (CYANOCOBALAMIN) 1000 MCG tablet Take 1,000 mcg by mouth daily.   Yes [provider]  aspirin EC 81 MG tablet Take 81 mg by mouth daily.    [provider]  atorvastatin (LIPITOR) 20 MG tablet Take 20 mg by mouth daily.    [provider]  glipiZIDE (GLIPIZIDE XL) 2.5 MG 24  hr tablet Take 1 tablet (2.5 mg total) by mouth daily with breakfast. Patient not taking: Reported on 04/24/2017 04/04/15   Penny Pia, MD  Multiple Vitamin (MULTIVITAMIN) tablet Take 1 tablet by mouth daily.    [provider]  naproxen (NAPROSYN) 500 MG tablet  09/14/19   [provider]  Polyethyl Glycol-Propyl Glycol (SYSTANE OP) Place 1 drop into both eyes 2 (two) times daily.     [provider]  traMADol (ULTRAM) 50 MG tablet Take 1 tablet (50 mg total) by mouth every 6  (six) hours as needed. 09/20/19   Hilts, Casimiro Needle, MD    Scheduled Meds: . sodium chloride   Intravenous Once  . sodium chloride   Intravenous Once  . atorvastatin  20 mg Oral Daily  . Chlorhexidine Gluconate Cloth  6 each Topical Daily  . mouth rinse  15 mL Mouth Rinse BID  . pantoprazole (PROTONIX) IV  40 mg Intravenous Q12H  . sodium chloride flush  3 mL Intravenous Q12H   Continuous Infusions: . sodium chloride     PRN Meds:.acetaminophen **OR** acetaminophen  Allergies as of 04/29/2020 - Review Complete 04/29/2020  Allergen Reaction Noted  . Amoxicillin-pot clavulanate Diarrhea 07/25/2016  . Sulfur  04/24/2015  . Amoxicillin Rash 04/24/2017  . Sulfamethoxazole Rash 02/29/2016  . Tape Rash and Other (See Comments) 04/01/2015    Family History  Problem Relation Age of Onset  . Heart disease Sister   . Heart attack Mother   . Cancer Brother   . Heart attack Sister   . Heart attack Brother     Social History   Socioeconomic History  . Marital status: Married    Spouse name: Not on file  . Number of children: Not on file  . Years of education: Not on file  . Highest education level: Not on file  Occupational History  . Not on file  Tobacco Use  . Smoking status: Former Smoker    Quit date: 1975    Years since quitting: 46.4  . Smokeless tobacco: Never Used  . Tobacco comment: quit smoking in 1975  Substance and Sexual Activity  . Alcohol use: No  . Drug use: No  . Sexual activity: Not on file  Other Topics Concern  . Not on file  Social History Narrative  . Not on file   Social Determinants of Health   Financial Resource Strain:   . Difficulty of Paying Living Expenses:   Food Insecurity:   . Worried About Programme researcher, broadcasting/film/video in the Last Year:   . Barista in the Last Year:   Transportation Needs:   . Freight forwarder (Medical):   Marland Kitchen Lack of Transportation (Non-Medical):   Physical Activity:   . Days of Exercise per Week:   . Minutes  of Exercise per Session:   Stress:   . Feeling of Stress :   Social Connections:   . Frequency of Communication with Friends and Family:   . Frequency of Social Gatherings with Friends and Family:   . Attends Religious Services:   . Active Member of Clubs or Organizations:   . Attends Banker Meetings:   Marland Kitchen Marital Status:   Intimate Partner Violence:   . Fear of Current or Ex-Partner:   . Emotionally Abused:   Marland Kitchen Physically Abused:   . Sexually Abused:     Review of Systems: All negative except as stated above in HPI.  Physical Exam: Vital signs: Vitals:  04/30/20 0500 04/30/20 0600  BP: (!) 158/68 (!) 144/58  Pulse: 93 82  Resp: (!) 21 14  Temp:    SpO2: 97% 96%   Last BM Date: 04/29/20 Physical Exam  Constitutional: She is oriented to person, place, and time. She appears well-nourished.  Elderly patient  HENT:  Head: Normocephalic and atraumatic.  Mouth/Throat: Oropharynx is clear and moist. No oropharyngeal exudate.  Eyes: EOM are normal. No scleral icterus.  Cardiovascular: Normal rate and normal heart sounds.  Pulmonary/Chest: Effort normal and breath sounds normal. No respiratory distress.  Abdominal: Soft. Bowel sounds are normal. She exhibits no distension. There is no abdominal tenderness. There is no rebound and no guarding.  Musculoskeletal:     Cervical back: Normal range of motion and neck supple.     Comments: Rectal exam performed with help of RN at bedside.  Patient had large amount of blood around perianal area.  Large external hemorrhoids.  No rectal mass.  Neurological: She is alert and oriented to person, place, and time.  Skin: Skin is warm. No erythema.  Psychiatric: She has a normal mood and affect. Judgment and thought content normal.  Nursing note and vitals reviewed.    GI:  Lab Results: Recent Labs    04/29/20 2204 04/30/20 0615  WBC 9.4 8.2  HGB 7.1* 7.5*  HCT 22.5* 23.7*  PLT 175 122*   BMET Recent Labs     04/29/20 2204 04/30/20 0615  NA 141 142  K 5.2* 4.5  CL 110 114*  CO2 23 20*  GLUCOSE 158* 136*  BUN 47* 45*  CREATININE 1.56* 1.42*  CALCIUM 9.2 8.2*   LFT Recent Labs    04/30/20 0615  PROT 5.1*  ALBUMIN 3.1*  AST 11*  ALT 9  ALKPHOS 28*  BILITOT 0.7   PT/INR Recent Labs    04/29/20 2204  LABPROT 13.6  INR 1.1     Studies/Results: No results found.  Impression/Plan: -Rectal bleeding since last 4 days.  Associated with rectal pain.  Rectal exam showed large external hemorrhoids but no rectal mass.  Most likely diverticular bleed versus hemorrhoidal bleed.  Less likely from abdominal aortic aneurysm as it has been going on for 4 days without causing any hemodynamic instability -Acute blood loss anemia  Recommendations ------------------------- -Get GI bleeding scan.  If positive recommend IR guided embolization.  If negative, we will arrange for colonoscopy for tomorrow.  May consider adding EGD tomorrow -Transfuse as needed to keep hemoglobin around 8 -Okay to start liquid diet after GI bleeding scan.  Okay to have sips of water now -Lidocaine topical applied over the rectum for rectal pain -GI will follow    LOS: 0 days   Kathi Der  MD, FACP 04/30/2020, 8:03 AM  Contact #  [phone]

## 2020-04-30 NOTE — ED Notes (Signed)
Patient lifted and placed into bed by staff. Patient placed in trendelenburg and fluids started.

## 2020-04-30 NOTE — Progress Notes (Signed)
-  Bleeding scan positive for active bleeding either in the small bowel or in the colon.  Discussed with interventional radiologist.  We will try limited angiogram to localize the bleeding.  Risk of nephrotoxicity discussed.  Primary team notified to start IV hydration  - called and D/W patients husband.   Kathi Der MD, FACP 04/30/2020, 3:06 PM  Contact #  [phone]

## 2020-04-30 NOTE — Sedation Documentation (Signed)
Gauze/tegaderm bandage applied to RIGHT femoral artery puncture site. Groin level 0, R DP/PT Doppler +.

## 2020-04-30 NOTE — Consult Note (Signed)
Chief Complaint: Patient was seen in consultation today for GI bleed at the request of Dr. Kathi DerParag Brahmbhatt  Referring Physician(s): Kathi DerParag Brahmbhatt, M.D.  [facility]  History of Present Illness: Jennifer [name] is a 86 y.o. female with rectal bleeding x 4 days and rectal pain. Initial Hgb 7.1. Rectal exam demonstrated significant blood in rectal vault and some external hemorrhoids. NM bleeding scan positive for bleed earlier today in region of distal SB or possibly proximal colon. Has received 4U PRBC's transfusion so far. Hemodynamically stable.  Past Medical History:  Diagnosis Date  . AAA (abdominal aortic aneurysm)-repaired   . Acute bronchitis   . Anemia   . CHF (congestive heart failure) (HCC)   . Diabetes mellitus without complication (HCC)   . GERD (gastroesophageal reflux disease)   . Hypertension   . Irregular heart beat   . Shortness of breath dyspnea   . Tachycardia    Unspecified    Past Surgical History:  Procedure Laterality Date  . ABDOMINAL AORTIC ANEURYSM REPAIR  2012   Dr. Arbie CookeyEarly  . ABDOMINAL HYSTERECTOMY     40 years ago, intestinal damage  . BREAST LUMPECTOMY Left    Benign Mass  . ESOPHAGOGASTRODUODENOSCOPY (EGD) WITH PROPOFOL Left 10/09/2015   Procedure: ESOPHAGOGASTRODUODENOSCOPY (EGD) WITH PROPOFOL;  Surgeon: Willis ModenaWilliam Outlaw, MD;  [facility];  Service: Endoscopy;  Laterality: Left;  . INCISION AND DRAINAGE ABSCESS N/A 02/28/2015   Procedure: INCISION AND DRAINAGE ABSCESS ABDOMINAL WALL;  Surgeon: Claud KelpHaywood Ingram, MD;  Location: [facility] OR;  Service: General;  Laterality: N/A;  . IR RADIOLOGIST EVAL & MGMT  04/24/2017  . OOPHORECTOMY     40 years ago  . THORACIC AORTIC ENDOVASCULAR STENT GRAFT N/A 09/27/2014   Procedure: THORACIC AORTIC ENDOVASCULAR STENT GRAFT;  Surgeon: Nada LibmanVance W Brabham, MD;  Location: [facility] OR;  Service: Vascular;  Laterality: N/A;    Allergies: Amoxicillin-pot clavulanate, Sulfur, Amoxicillin,  Sulfamethoxazole, and Tape  Medications: Prior to Admission medications   Medication Sig Start Date End Date Taking? Authorizing Provider  alendronate (FOSAMAX) 70 MG tablet Take 70 mg by mouth once a week. Take with a full glass of water on an empty stomach. Wednesday   Yes [provider]  atorvastatin (LIPITOR) 20 MG tablet Take 20 mg by mouth daily.   Yes [provider]  Cholecalciferol (VITAMIN D-3) 125 MCG (5000 UT) TABS Take 5,000 Units by mouth daily.   Yes [provider]  furosemide (LASIX) 20 MG tablet Take 20 mg by mouth 2 (two) times daily.    Yes [provider]  hydrocortisone cream 0.5 % Apply 1 application topically as needed for itching.   Yes [provider]  KLOR-CON M20 20 MEQ tablet Take 20 mEq by mouth daily. 06/22/15  Yes [provider]  labetalol (NORMODYNE) 100 MG tablet Take 1 tablet (100 mg total) by mouth 2 (two) times daily. 04/04/15  Yes Penny PiaVega, [place], MD  Nutritional Supplements (JUICE PLUS FIBRE PO) Take 3 tablets by mouth daily.    Yes [provider]  omeprazole (PRILOSEC) 40 MG capsule Take 40 mg by mouth daily. 03/17/15  Yes [provider]  phenylephrine-shark liver oil-mineral oil-petrolatum (PREPARATION H) 0.25-3-14-71.9 % rectal ointment Place 1 application rectally 2 (two) times daily as needed for hemorrhoids.   Yes [provider]  Polyethyl Glycol-Propyl Glycol (SYSTANE OP) Place 1 drop into both eyes 2 (two) times daily.    Yes [provider]  vitamin B-12 (CYANOCOBALAMIN)  1000 MCG tablet Take 1,000 mcg by mouth daily.   Yes [provider]  glipiZIDE (GLIPIZIDE XL) 2.5 MG 24 hr tablet Take 1 tablet (2.5 mg total) by mouth daily with breakfast. Patient not taking: Reported on 04/24/2017 04/04/15   Penny Pia, MD  traMADol (ULTRAM) 50 MG tablet Take 1 tablet (50 mg total) by mouth every 6 (six) hours as needed. Patient not taking: Reported on 04/30/2020  09/20/19   Hilts, Casimiro Needle, MD     Family History  Problem Relation Age of Onset  . Heart disease Sister   . Heart attack Mother   . Cancer Brother   . Heart attack Sister   . Heart attack Brother     Social History   Socioeconomic History  . Marital status: Married    Spouse name: Not on file  . Number of children: Not on file  . Years of education: Not on file  . Highest education level: Not on file  Occupational History  . Not on file  Tobacco Use  . Smoking status: Former Smoker    Quit date: 1975    Years since quitting: 46.4  . Smokeless tobacco: Never Used  . Tobacco comment: quit smoking in 1975  Substance and Sexual Activity  . Alcohol use: No  . Drug use: No  . Sexual activity: Not on file  Other Topics Concern  . Not on file  Social History Narrative  . Not on file   Social Determinants of Health   Financial Resource Strain:   . Difficulty of Paying Living Expenses:   Food Insecurity:   . Worried About Programme researcher, broadcasting/film/video in the Last Year:   . Barista in the Last Year:   Transportation Needs:   . Freight forwarder (Medical):   Marland Kitchen Lack of Transportation (Non-Medical):   Physical Activity:   . Days of Exercise per Week:   . Minutes of Exercise per Session:   Stress:   . Feeling of Stress :   Social Connections:   . Frequency of Communication with Friends and Family:   . Frequency of Social Gatherings with Friends and Family:   . Attends Religious Services:   . Active Member of Clubs or Organizations:   . Attends Banker Meetings:   Marland Kitchen Marital Status:     Review of Systems: A 12 point ROS discussed and pertinent positives are indicated in the HPI above.  All other systems are negative.  Review of Systems  Constitutional: Positive for fatigue.  Respiratory: Negative.   Cardiovascular: Negative.   Gastrointestinal: Positive for abdominal pain, blood in stool and rectal pain.  Genitourinary: Negative.   Musculoskeletal:  Negative.   Neurological: Negative.     Vital Signs: BP (!) 147/102   Pulse 82   Temp 98.2 F (36.8 C) (Oral)   Resp 18   Ht 5\' 3"  (1.6 m)   Wt 63.8 kg   SpO2 97%   BMI 24.92 kg/m   Physical Exam Vitals reviewed.  Constitutional:      Appearance: She is ill-appearing.     Comments: Pale  Cardiovascular:     Pulses: Normal pulses.     Heart sounds: Normal heart sounds.  Pulmonary:     Effort: Pulmonary effort is normal.     Breath sounds: Normal breath sounds.  Abdominal:     Palpations: Abdomen is soft.     Tenderness: There is abdominal tenderness. There is no guarding or rebound.  Musculoskeletal:        General: No swelling.  Skin:    General: Skin is warm and dry.  Neurological:     General: No focal deficit present.     Mental Status: She is alert and oriented to person, place, and time.     Imaging: NM GI Blood Loss  Result Date: 04/30/2020 CLINICAL DATA:  86 year old female with GI bleed. EXAM: NUCLEAR MEDICINE GASTROINTESTINAL BLEEDING SCAN TECHNIQUE: Sequential abdominal images were obtained following intravenous administration of Tc-52m labeled red blood cells. RADIOPHARMACEUTICALS:  20.1 mCi Tc-33m pertechnetate in-vitro labeled red cells. COMPARISON:  05/08/2015 CT and 04/26/2015 GI bleeding study. FINDINGS: The patient could only tolerate 1-1/2 hours of scanning. Abnormal activity is initially identified within the mid-upper RIGHT abdomen and extends to the LEFT side. Given appearance and location of bowel loops on prior CT, favor GI bleed originating from proximal small bowel although it is difficult to entirely exclude active bleeding originating within the colon at the hepatic flexure. IMPRESSION: Active GI bleeding.  Favor small bowel bleed as discussed above. Electronically Signed   By: Harmon Pier M.D.   On: 04/30/2020 14:15   ECHOCARDIOGRAM COMPLETE  Result Date: 04/30/2020    ECHOCARDIOGRAM REPORT   Patient Name:   EYLEEN RAWLINSON Date of Exam:  04/30/2020 Medical Rec #:  [id number]     Height:       63.0 in Accession #:    [id number]    Weight:       140.7 lb Date of Birth:  27-Jan-1925     BSA:          1.665 m Patient Age:    89 years      BP:           119/55 mmHg Patient Gender: F             HR:           88 bpm. Exam Location:  Inpatient Procedure: 2D Echo Indications:    Chest Pain 786.50 / R07.9  History:        Patient has prior history of Echocardiogram examinations, most                 recent 04/01/2015. Risk Factors:Diabetes, Hypertension and                 Dyslipidemia. CKD                 History of aortic aneurysm.  Sonographer:    Leeroy Bock Turrentine Referring Phys: [id number] ANASTASSIA DOUTOVA IMPRESSIONS  1. Left ventricular ejection fraction, by estimation, is 55 to 60%. The left ventricle has normal function. The left ventricle has no regional wall motion abnormalities. There is mild left ventricular hypertrophy. Left ventricular diastolic parameters are consistent with Grade I diastolic dysfunction (impaired relaxation).  2. Right ventricular systolic function is normal. The right ventricular size is normal. Tricuspid regurgitation signal is inadequate for assessing PA pressure.  3. The mitral valve is degenerative, mildly thickened and with moderate annular calcification. Mild mitral valve regurgitation.  4. The aortic valve is tricuspid. Aortic valve regurgitation is moderate. Mild to moderate aortic valve sclerosis/calcification is present, without any evidence of aortic stenosis.  5. The inferior vena cava is normal in size with greater than 50% respiratory variability, suggesting right atrial pressure of 3 mmHg.  6. Distal aortic arch and descending aorta not well seen - history of stent graft repair of aneurysm. FINDINGS  Left Ventricle: Left  ventricular ejection fraction, by estimation, is 55 to 60%. The left ventricle has normal function. The left ventricle has no regional wall motion abnormalities. The left ventricular internal cavity  size was small. There is mild left ventricular hypertrophy. Left ventricular diastolic parameters are consistent with Grade I diastolic dysfunction (impaired relaxation). Right Ventricle: The right ventricular size is normal. No increase in right ventricular wall thickness. Right ventricular systolic function is normal. Tricuspid regurgitation signal is inadequate for assessing PA pressure. Left Atrium: Left atrial size was normal in size. Right Atrium: Right atrial size was normal in size. Pericardium: There is no evidence of pericardial effusion. Presence of pericardial fat pad. Mitral Valve: The mitral valve is degenerative in appearance. There is mild calcification of the mitral valve leaflet(s). Moderate mitral annular calcification. Mild mitral valve regurgitation. Tricuspid Valve: The tricuspid valve is grossly normal. Tricuspid valve regurgitation is mild. Aortic Valve: The aortic valve is tricuspid. Aortic valve regurgitation is moderate. Mild to moderate aortic valve sclerosis/calcification is present, without any evidence of aortic stenosis. Moderate aortic valve annular calcification. Pulmonic Valve: The pulmonic valve was grossly normal. Pulmonic valve regurgitation is trivial. Aorta: The aortic root is normal in size and structure. Venous: The inferior vena cava is normal in size with greater than 50% respiratory variability, suggesting right atrial pressure of 3 mmHg. IAS/Shunts: The interatrial septum appears to be lipomatous. No atrial level shunt detected by color flow Doppler.  LEFT VENTRICLE PLAX 2D LVIDd:         3.60 cm  Diastology LVIDs:         2.80 cm  LV e' lateral:   5.33 cm/s LV PW:         1.00 cm  LV E/e' lateral: 15.2 LV IVS:        1.00 cm  LV e' medial:    4.90 cm/s LVOT diam:     2.00 cm  LV E/e' medial:  16.6 LV SV:         43 LV SV Index:   26 LVOT Area:     3.14 cm  RIGHT VENTRICLE RV S prime:     10.10 cm/s LEFT ATRIUM           Index       RIGHT ATRIUM           Index LA Vol  (A4C): 28.0 ml 16.82 ml/m RA Area:     11.80 cm                                   RA Volume:   25.00 ml  15.02 ml/m  AORTIC VALVE LVOT Vmax:   100.00 cm/s LVOT Vmean:  66.500 cm/s LVOT VTI:    0.138 m  AORTA Ao Root diam: 3.20 cm MITRAL VALVE MV Area (PHT): 4.53 cm     SHUNTS MV Decel Time: 168 msec     Systemic VTI:  0.14 m MV E velocity: 81.10 cm/s   Systemic Diam: 2.00 cm MV A velocity: 100.00 cm/s MV E/A ratio:  0.81 Nona Dell MD Electronically signed by Nona Dell MD Signature Date/Time: 04/30/2020/10:53:52 AM    Final     Labs:  CBC: Recent Labs    04/29/20 2204 04/30/20 0615  WBC 9.4 8.2  HGB 7.1* 7.5*  HCT 22.5* 23.7*  PLT 175 122*    COAGS: Recent Labs    04/29/20 2204  INR 1.1  APTT 20*    BMP: Recent Labs    04/29/20 2204 04/30/20 0615  NA 141 142  K 5.2* 4.5  CL 110 114*  CO2 23 20*  GLUCOSE 158* 136*  BUN 47* 45*  CALCIUM 9.2 8.2*  CREATININE 1.56* 1.42*  GFRNONAA 29* 32*  GFRAA 33* 37*    LIVER FUNCTION TESTS: Recent Labs    04/29/20 2204 04/30/20 0615  BILITOT 0.6 0.7  AST 13* 11*  ALT 10 9  ALKPHOS 42 28*  PROT 6.2* 5.1*  ALBUMIN 3.7 3.1*    Assessment and Plan:  For mesenteric arteriography today with possible embolization if bleed localized. Risks and benefits of mesenteric arteriography were discussed with the patient including, but not limited to bleeding, infection, vascular injury or contrast induced renal failure.  This interventional procedure involves the use of X-rays and because of the nature of the planned procedure, it is possible that we will have prolonged use of X-ray fluoroscopy.  Potential radiation risks to you include (but are not limited to) the following: - A slightly elevated risk for cancer  several years later in life. This risk is typically less than 0.5% percent. This risk is low in comparison to the normal incidence of human cancer, which is 33% for women and 50% for men according to the [place]. - Radiation induced injury can include skin redness, resembling a rash, tissue breakdown / ulcers and hair loss (which can be temporary or permanent).   The likelihood of either of these occurring depends on the difficulty of the procedure and whether you are sensitive to radiation due to previous procedures, disease, or genetic conditions.   IF your procedure requires a prolonged use of radiation, you will be notified and given written instructions for further action.  It is your responsibility to monitor the irradiated area for the 2 weeks following the procedure and to notify your physician if you are concerned that you have suffered a radiation induced injury.    All of the patient's questions were answered, patient is agreeable to proceed.  Consent signed and in chart.  Thank you for this interesting consult.  I greatly enjoyed meeting AMORI COOPERMAN and look forward to participating in their care.  A copy of this report was sent to the requesting provider on this date.  Electronically Signed: Azzie Roup, MD 04/30/2020, 3:19 PM     I spent a total of 20 Minutes in face to face in clinical consultation, greater than 50% of which was counseling/coordinating care for GI bleed.

## 2020-04-30 NOTE — Plan of Care (Signed)
  Problem: Education: Goal: Knowledge of General Education information will improve Description: Including pain rating scale, medication(s)/side effects and non-pharmacologic comfort measures Outcome: Not Progressing   Problem: Health Behavior/Discharge Planning: Goal: Ability to manage health-related needs will improve Outcome: Not Progressing   Problem: Clinical Measurements: Goal: Diagnostic test results will improve Outcome: Not Progressing   Problem: Coping: Goal: Level of anxiety will decrease Outcome: Not Progressing   Problem: Pain Managment: Goal: General experience of comfort will improve Outcome: Not Progressing

## 2020-04-30 NOTE — Progress Notes (Signed)
    BRIEF OVERNIGHT PROGRESS REPORT    SUBJECTIVE: Per patient's RN, patient continues to have episodes of  Large dark red blood per rectum. She remains asymptomatic without associated symptoms of SOB, tachycardia, dizziness or palpitation.  OBJECTIVE: She remains afebrile with blood pressure 147/72 mm Hg and pulse rate 99 beats/min. There were no focal neurological deficits; he was alert and oriented x4, and she did not demonstrate any obvious distress per staff.  BRIEF PATIENT DESCRIPTION: 86 y.o. female with medical history significant of Dm 2, HTN, chronic iron deficiency anemia secondary to blood loss,, chronic diastolic CHF, HLD, osteoporosis, depression AAA status post repair, GERD, known history of severe degenerative disc disease Admitted for lower gi bleed  ASSESSMENT: Acute Lower GI Bleed  PLAN: 1. Acute GI Bleed - Likely Lower without evidence hemodynamic instability -S/p PRBCs transfusion 2 units - Will obtain bleeding scan for further evaluation - At least 2x IV access, 18 gauge or larger - IVF resuscitation to maintain MAP>65 - H&H monitoring q6h - Blood Consent.  Transfuse PRN Hgb<8 - Pantoprazole 40mg  IV BID - NPO for possible procedure - Consider GI Consult for EGD/Colonoscopy - Helicobacter pylori Ab + stool Ag.   - Hold NSAIDs, steroids, ASA    , DNP, CCRN, [facility] Nurse Practitioner

## 2020-04-30 NOTE — Procedures (Signed)
Interventional Radiology Procedure Note  Procedure: SMA arteriography  Complications: None  Estimated Blood Loss: < 10 mL  Findings: Selective SMA arteriography before and after NTG injection shows no active bleeding, vascular malformation or other vascular abnormality. IMA is occluded and large [place] supplies IMA territory via SMA.  Jodi Marble. Fredia Sorrow, M.D Pager:  [phone]

## 2020-04-30 NOTE — ED Notes (Addendum)
Patient began having chest pain, MD at bedside. Vital signs stable, lung sounds clear, S1S2 heard, no extra sounds.

## 2020-04-30 NOTE — ED Notes (Addendum)
Patient on bedside commode stating she is feeling weak and like she's going to "fall out." BP rechecked, hypotensive. MD called to bedside.

## 2020-04-30 NOTE — ED Triage Notes (Signed)
Patient arrives from home via EMS with complaints of GI bleeding x4 days with abdominal cramping. Per EMS, patient had an approximately 100 cc BRB bowel movement before arriving. Patient believes it started with a hemorrhoid. Patient noted to be pale on arrival and complaining of mid and lower back pain. MD called to bedside.

## 2020-04-30 NOTE — Progress Notes (Signed)
Patient ID: Jennifer [name], female   DOB: 16-Nov-1925, 86 y.o.   MRN: [id number]  PROGRESS NOTE    DAROLYN DOUBLE  FTD:[id number] DOB: Mar 04, 1925 DOA: 04/29/2020 PCP: Lorenda Ishihara, MD    Brief Narrative:  Jennifer [name] is a 86 y.o. female with medical history significant of Dm 2, HTN, chronic iron deficiency anemia secondary to blood loss,, chronic diastolic CHF, HLD, osteoporosis, depression AAA status post repair, GERD, known history of severe degenerative disc disease   Presented with rectal bleeding for the past 4 days bright red blood per rectum once EMS arrived and noted about 100 mL of bright red blood in the commode.  Patient with prior history of hemorrhoids and chronic blood loss.  No abdominal pain no weakness no shortness of breath no nausea no vomiting Patient also endorsed some back pain that is what brought her into emergency department Patient is on naproxen   Assessment & Plan:   Active Problems:   CKD (chronic kidney disease), stage III   Diabetes (HCC)   CHF (congestive heart failure) (HCC)   HTN (hypertension)   Hyperlipidemia   Acute blood loss anemia   Lower GI bleed   Diastolic CHF (HCC)   Chest pain   Abnormal ECG   Hyperkalemia   Prolonged QT interval  Lower GI bleed - - Suspect Lower Gi source  No hx of PUD, no melena--GI consult, s/p 4 u PRBCs   - serial CBC.   - Establish at least 2 PIV and fluid resuscitate    keep nothing by mouth    - Bleeding scan stat -showed source for IR attempt at embolization per GI and IR discussion--will hydrate to protect renal function  CKD (chronic kidney disease), stage III -avoid nephrotoxic medications--getting contrast for IR emolization--begin hydration and monitor closely, avoid other nephrotoxic agents  HTN (hypertension) -hold home medication given soft BP's eval as needed  Hyperlipidemia -stable continue home medication   Acute blood loss anemia -transfuse 4 units and repeat CBC transfuse as  needed   Diastolic CHF (HCC) -gently rehydrate appears to be fluid down   Chest pain -cycle cardiac enzymes monitor repeat EKG.  Could be demand ischemia in the setting of GI blood loss versus musculoskeletal since chest pain is reproducible by palpation but EKG does show some ischemic changes more consistent with demand--reviewed with cards--no good options given bleeding, think it is demand ischemia--could add B-blocker if tolerates Obtain echo--EF is 55-60% with Grade 1 diastolic dysfunction, mitral valve degeneration with thickened valve and annular calcification with mild MR, moderate AR  Abnormal ECG -cycle cardiac enzymes and obtain echo in the morning  Hyperkalemia -mild we will rehydrate  Prolonged QT interval - - will monitor on tele avoid QT prolonging medications, rehydrate correct electrolytes  Back pain - appears musculoskeletal at this time,  Pt with hx of sevre DDD   Dm 2 -  - Order Sensitive  SSI   DVT prophylaxis: None:active bleeding Code Status: Full code  Family Communication: Husband by phone Disposition Plan: Home   Consultants:   GI  Procedures:  NM GI bleeding scan  Antimicrobials: Anti-infectives (From admission, onward)   None       Subjective: Continues to have some chest pain. On-going GI blood loss.  Objective: Vitals:   04/30/20 0500 04/30/20 0600 04/30/20 0840 04/30/20 0855  BP: (!) 158/68 (!) 144/58 (!) 124/59 (!) 119/55  Pulse: 93 82 89 88  Resp: (!) 21 14  19   Temp:  98 F (36.7 C) 98 F (36.7 C)  TempSrc:   Oral Oral  SpO2: 97% 96% 95% 94%  Weight:      Height:        Intake/Output Summary (Last 24 hours) at 04/30/2020 0954 Last data filed at 04/30/2020 0445 Gross per 24 hour  Intake 1445 ml  Output --  Net 1445 ml   Filed Weights   04/30/20 0143  Weight: 63.8 kg    Examination:  General exam: Appears calm and comfortable  Respiratory system: Clear to auscultation. Respiratory effort  normal. Cardiovascular system: distant S1 & S2 heard, RRR.  Gastrointestinal system: Abdomen is nondistended, soft and nontender.  Central nervous system: Alert and oriented. No focal neurological deficits. Extremities: Symmetric  Skin: No rashes Psychiatry: Judgement and insight appear somewhat slowed. Mood & affect appropriate.   Data Reviewed: I have personally reviewed following labs and imaging studies  CBC: Recent Labs  Lab 04/29/20 2204 04/30/20 0615  WBC 9.4 8.2  NEUTROABS  --  5.1  HGB 7.1* 7.5*  HCT 22.5* 23.7*  MCV 94.9 93.3  PLT 175 122*   Basic Metabolic Panel: Recent Labs  Lab 04/29/20 2204 04/30/20 0615  NA 141 142  K 5.2* 4.5  CL 110 114*  CO2 23 20*  GLUCOSE 158* 136*  BUN 47* 45*  CREATININE 1.56* 1.42*  CALCIUM 9.2 8.2*  MG  --  2.2  PHOS  --  4.3   GFR: Estimated Creatinine Clearance: 22.7 mL/min (A) (by C-G formula based on SCr of 1.42 mg/dL (H)). Liver Function Tests: Recent Labs  Lab 04/29/20 2204 04/30/20 0615  AST 13* 11*  ALT 10 9  ALKPHOS 42 28*  BILITOT 0.6 0.7  PROT 6.2* 5.1*  ALBUMIN 3.7 3.1*   Coagulation Profile: Recent Labs  Lab 04/29/20 2204  INR 1.1   Thyroid Function Tests: Recent Labs    04/30/20 0615  TSH 0.959   Anemia Panel: Recent Labs    04/29/20 2332  VITAMINB12 651  FERRITIN 49  TIBC 318  IRON 41  RETICCTPCT 2.1    Recent Results (from the past 240 hour(s))  SARS Coronavirus 2 by RT PCR (hospital order, performed in [facility] hospital lab) Nasopharyngeal Nasopharyngeal Swab     Status: None   Collection Time: 04/30/20 12:30 AM   Specimen: Nasopharyngeal Swab  Result Value Ref Range Status   SARS Coronavirus 2 NEGATIVE NEGATIVE Final    Comment: (NOTE) SARS-CoV-2 target nucleic acids are NOT DETECTED. The SARS-CoV-2 RNA is generally detectable in upper and lower respiratory specimens during the acute phase of infection. The lowest concentration of SARS-CoV-2 viral copies this assay can  detect is 250 copies / mL. A negative result does not preclude SARS-CoV-2 infection and should not be used as the sole basis for treatment or other patient management decisions.  A negative result may occur with improper specimen collection / handling, submission of specimen other than nasopharyngeal swab, presence of viral mutation(s) within the areas targeted by this assay, and inadequate number of viral copies (<250 copies / mL). A negative result must be combined with clinical observations, patient history, and epidemiological information. Fact Sheet for Patients:   BoilerBrush.com.cy Fact Sheet for Healthcare Providers: https://pope.com/ This test is not yet approved or cleared  by the [place] FDA and has been authorized for detection and/or diagnosis of SARS-CoV-2 by FDA under an Emergency Use Authorization (EUA).  This EUA will remain in effect (meaning this test can be used) for  the duration of the COVID-19 declaration under Section 564(b)(1) of the Act, 21 U.S.C. section 360bbb-3(b)(1), unless the authorization is terminated or revoked sooner. Performed at [facility], [place] [address] [place], [place] [address]   MRSA PCR Screening     Status: None   Collection Time: 04/30/20  1:57 AM   Specimen: Nasal Mucosa; Nasopharyngeal  Result Value Ref Range Status   MRSA by PCR NEGATIVE NEGATIVE Final    Comment:        The GeneXpert MRSA Assay (FDA approved for NASAL specimens only), is one component of a comprehensive MRSA colonization surveillance program. It is not intended to diagnose MRSA infection nor to guide or monitor treatment for MRSA infections. Performed at [facility], [place] [address], [place],  [address]       Radiology Studies: No results found.   Scheduled Meds: . sodium chloride   Intravenous Once  . sodium chloride   Intravenous Once  . atorvastatin   20 mg Oral Daily  . Chlorhexidine Gluconate Cloth  6 each Topical Daily  . lidocaine   Topical Q8H  . mouth rinse  15 mL Mouth Rinse BID  . pantoprazole (PROTONIX) IV  40 mg Intravenous Q12H  . sodium chloride flush  3 mL Intravenous Q12H   Continuous Infusions: . sodium chloride 75 mL/hr at 04/30/20 0851     LOS: 0 days    Donnamae Jude, MD 04/30/2020 9:54 AM [phone] [facility] If 7PM-7AM, please contact night-coverage 04/30/2020, 9:54 AM

## 2020-04-30 NOTE — Progress Notes (Signed)
Patient has drunk approximately 30% of bowel prep.  She refuses to drink any more.  This RN tried to explain the importance of drinking the bowel prep for the colonoscopy, but she still refuses.  Patient said "I have had enough of that stuff."

## 2020-04-30 NOTE — Progress Notes (Signed)
  Echocardiogram 2D Echocardiogram has been performed.  Cosimo Schertzer A Shaka Cardin 04/30/2020, 9:29 AM

## 2020-04-30 NOTE — Sedation Documentation (Signed)
6Fr sheath removed for RIGHT femoral artery by Dr. Fredia Sorrow. Hemostasis achieved with Exoseal closure device. Groin level 0, RDP/PT Doppler +.

## 2020-05-01 ENCOUNTER — Inpatient Hospital Stay (HOSPITAL_COMMUNITY): Payer: Medicare HMO | Admitting: Certified Registered Nurse Anesthetist

## 2020-05-01 ENCOUNTER — Encounter (HOSPITAL_COMMUNITY): Payer: Self-pay | Admitting: Internal Medicine

## 2020-05-01 ENCOUNTER — Encounter (HOSPITAL_COMMUNITY)
Admission: EM | Disposition: A | Discharge status: Home Health | Payer: Self-pay | Source: Home / Self Care | Attending: Family Medicine

## 2020-05-01 HISTORY — PX: COLONOSCOPY WITH PROPOFOL: SHX5780

## 2020-05-01 HISTORY — PX: ENTEROSCOPY: SHX5533

## 2020-05-01 HISTORY — PX: BIOPSY: SHX5522

## 2020-05-01 LAB — TYPE AND SCREEN
ABO/RH(D): O POS
Antibody Screen: NEGATIVE
Unit division: 0
Unit division: 0
Unit division: 0
Unit division: 0

## 2020-05-01 LAB — BASIC METABOLIC PANEL
Anion gap: 3 — ABNORMAL LOW (ref 5–15)
BUN: 19 mg/dL (ref 8–23)
CO2: 21 mmol/L — ABNORMAL LOW (ref 22–32)
Calcium: 7.8 mg/dL — ABNORMAL LOW (ref 8.9–10.3)
Chloride: 117 mmol/L — ABNORMAL HIGH (ref 98–111)
Creatinine, Ser: 1.09 mg/dL — ABNORMAL HIGH (ref 0.44–1.00)
GFR calc Af Amer: 51 mL/min — ABNORMAL LOW (ref >60)
GFR calc non Af Amer: 44 mL/min — ABNORMAL LOW (ref >60)
Glucose, Bld: 119 mg/dL — ABNORMAL HIGH (ref 70–99)
Potassium: 3.7 mmol/L (ref 3.5–5.1)
Sodium: 141 mmol/L (ref 135–145)

## 2020-05-01 LAB — BPAM RBC
Blood Product Expiration Date: 202107022359
Blood Product Expiration Date: 202107032359
Blood Product Expiration Date: 202107032359
Blood Product Expiration Date: 202107032359
ISSUE DATE / TIME: 202105300014
ISSUE DATE / TIME: 202105300240
ISSUE DATE / TIME: 202105300815
ISSUE DATE / TIME: 202105301113
Unit Type and Rh: 5100
Unit Type and Rh: 5100
Unit Type and Rh: 5100
Unit Type and Rh: 5100

## 2020-05-01 LAB — GLUCOSE, CAPILLARY
Glucose-Capillary: 107 mg/dL — ABNORMAL HIGH (ref 70–99)
Glucose-Capillary: 92 mg/dL (ref 70–99)
Glucose-Capillary: 110 mg/dL — ABNORMAL HIGH (ref 70–99)
Glucose-Capillary: 129 mg/dL — ABNORMAL HIGH (ref 70–99)

## 2020-05-01 LAB — CBC
HCT: 24.7 % — ABNORMAL LOW (ref 36.0–46.0)
Hemoglobin: 7.9 g/dL — ABNORMAL LOW (ref 12.0–15.0)
MCH: 29.8 pg (ref 26.0–34.0)
MCHC: 32 g/dL (ref 30.0–36.0)
MCV: 93.2 fL (ref 80.0–100.0)
Platelets: 95 10*3/uL — ABNORMAL LOW (ref 150–400)
RBC: 2.65 MIL/uL — ABNORMAL LOW (ref 3.87–5.11)
RDW: 16.9 % — ABNORMAL HIGH (ref 11.5–15.5)
WBC: 8.3 10*3/uL (ref 4.0–10.5)
nRBC: 0 % (ref 0.0–0.2)

## 2020-05-01 SURGERY — ENTEROSCOPY
Anesthesia: Monitor Anesthesia Care

## 2020-05-01 MED ORDER — HYDROCORTISONE ACETATE 25 MG RE SUPP
25.0000 mg | Freq: Two times a day (BID) | RECTAL | Status: DC
  Administered 2020-05-01 – 2020-05-04 (×6): 25 mg via RECTAL
  Filled 2020-05-01 (×8): qty 1

## 2020-05-01 MED ORDER — PANTOPRAZOLE SODIUM 40 MG IV SOLR
40.0000 mg | Freq: Two times a day (BID) | INTRAVENOUS | Status: DC
  Administered 2020-05-01 – 2020-05-04 (×6): 40 mg via INTRAVENOUS
  Filled 2020-05-01 (×6): qty 40

## 2020-05-01 MED ORDER — PROPOFOL 500 MG/50ML IV EMUL
INTRAVENOUS | Status: AC
  Filled 2020-05-01: qty 50

## 2020-05-01 MED ORDER — PHENOL 1.4 % MT LIQD: 1.0000 | OROMUCOSAL | Status: DC | PRN

## 2020-05-01 MED ORDER — PROPOFOL 10 MG/ML IV BOLUS
INTRAVENOUS | Status: DC | PRN
  Administered 2020-05-01: 10 mg via INTRAVENOUS
  Administered 2020-05-01: 20 mg via INTRAVENOUS

## 2020-05-01 MED ORDER — ATORVASTATIN CALCIUM 20 MG PO TABS
20.0000 mg | ORAL_TABLET | Freq: Every day | ORAL | Status: DC
  Administered 2020-05-01 – 2020-05-04 (×4): 20 mg via ORAL
  Filled 2020-05-01: qty 2
  Filled 2020-05-01 (×2): qty 1
  Filled 2020-05-01: qty 2

## 2020-05-01 MED ORDER — LABETALOL HCL 100 MG PO TABS
100.0000 mg | ORAL_TABLET | Freq: Two times a day (BID) | ORAL | Status: DC
  Administered 2020-05-01 – 2020-05-04 (×6): 100 mg via ORAL
  Filled 2020-05-01 (×6): qty 1

## 2020-05-01 MED ORDER — PROPOFOL 1000 MG/100ML IV EMUL
INTRAVENOUS | Status: AC
  Filled 2020-05-01: qty 100

## 2020-05-01 MED ORDER — PROPOFOL 500 MG/50ML IV EMUL
INTRAVENOUS | Status: DC | PRN
  Administered 2020-05-01: 75 ug/kg/min via INTRAVENOUS

## 2020-05-01 MED ORDER — LACTATED RINGERS IV SOLN: INTRAVENOUS | Status: DC

## 2020-05-01 MED ORDER — ONDANSETRON HCL 4 MG/2ML IJ SOLN
INTRAMUSCULAR | Status: DC | PRN
  Administered 2020-05-01: 4 mg via INTRAVENOUS

## 2020-05-01 MED ORDER — FUROSEMIDE 20 MG PO TABS
20.0000 mg | ORAL_TABLET | Freq: Two times a day (BID) | ORAL | Status: DC
  Administered 2020-05-02 – 2020-05-04 (×5): 20 mg via ORAL
  Filled 2020-05-01 (×5): qty 1

## 2020-05-01 SURGICAL SUPPLY — 22 items

## 2020-05-01 NOTE — Progress Notes (Signed)
[facility] Progress Note  Jennifer [name] 86 y.o. 1925-11-15  CC:   GI bleed   Subjective: Patient seen and examined at bedside endoscopy unit.  Yesterday's events noted.  Bleeding scan was positive for bleeding in the small bowel or hepatic flexure of the colon but follow-up arteriography was negative.  Only drink 30% of the prep.  ROS : Afebrile.  Anxious   Objective: Vital signs in last 24 hours: Vitals:   05/01/20 0351 05/01/20 0728  BP:  (!) 157/64  Pulse:  94  Resp:  18  Temp: 98.2 F (36.8 C) 98.3 F (36.8 C)  SpO2:  94%    Physical Exam:  General.  Elderly patient.  Anxious but not in acute distress Abdomen.  Soft, nontender, nondistended, bowel sounds present. Neuro.  Alert/oriented x3 Psych.  Anxious Lab Results: Recent Labs    04/29/20 2204 04/30/20 0615  NA 141 142  K 5.2* 4.5  CL 110 114*  CO2 23 20*  GLUCOSE 158* 136*  BUN 47* 45*  CREATININE 1.56* 1.42*  CALCIUM 9.2 8.2*  MG  --  2.2  PHOS  --  4.3   Recent Labs    04/29/20 2204 04/30/20 0615  AST 13* 11*  ALT 10 9  ALKPHOS 42 28*  BILITOT 0.6 0.7  PROT 6.2* 5.1*  ALBUMIN 3.7 3.1*   Recent Labs    04/30/20 0615 04/30/20 1654 04/30/20 2219 05/01/20 0412  WBC 8.2   < > 8.9 8.3  NEUTROABS 5.1  --   --   --   HGB 7.5*   < > 7.9* 7.9*  HCT 23.7*   < > 24.8* 24.7*  MCV 93.3   < > 93.2 93.2  PLT 122*   < > 93* 95*   < > = values in this interval not displayed.   Recent Labs    04/29/20 2204  LABPROT 13.6  INR 1.1      Assessment/Plan: GI bleed. Bleeding scan was positive for bleeding in the small bowel or hepatic flexure of the colon but follow-up arteriography was negative. Acute blood loss anemia.  Status post blood transfusion.  Hemoglobin stable now Acute kidney injury  Recommendations --------------------------- -Proceed with push enteroscopy and colonoscopy today.  - called patient's husband. Both numbers. No answers.   Risks (bleeding, infection,  bowel perforation that could require surgery, sedation-related changes in cardiopulmonary systems), benefits (identification and possible treatment of source of symptoms, exclusion of certain causes of symptoms), and alternatives (watchful waiting, radiographic imaging studies, empiric medical treatment)  were explained to patient in detail and patient wishes to proceed.   Kathi Der MD, FACP 05/01/2020, 8:02 AM  Contact #  [phone]

## 2020-05-01 NOTE — Anesthesia Procedure Notes (Signed)
Procedure Name: MAC Date/Time: 05/01/2020 8:17 AM Performed by: Maxwell Caul, CRNA Pre-anesthesia Checklist: Patient identified, Emergency Drugs available, Suction available and Timeout performed Oxygen Delivery Method: Simple face mask

## 2020-05-01 NOTE — Brief Op Note (Signed)
04/29/2020 - 05/01/2020  9:18 AM  PATIENT:  Jennifer [name]  86 y.o. female  PRE-OPERATIVE DIAGNOSIS:  GI bleed  POST-OPERATIVE DIAGNOSIS:  gastritis, diverticulosis, poor prep   PROCEDURE:  Procedure(s): ENTEROSCOPY (N/A) COLONOSCOPY WITH PROPOFOL (N/A) BIOPSY  SURGEON:  Surgeon(s) and Role:    * Finnley Lewis, MD - Primary  Findings --------- -Push enteroscopy showed mild gastritis otherwise no evidence of active bleeding. -Colonoscopy showed poor prep, scattered old blood throughout the colon, diverticulosis and large internal and external hemorrhoids.  No evidence of active bleeding.  Not able to intubate TI despite multiple attempts.  Recommendations ------------------------- -Start full liquid diet -Monitor H&H - called and D/W Husband.  - GI will follow   Kathi Der MD, FACP 05/01/2020, 9:21 AM  Contact #  [phone]

## 2020-05-01 NOTE — Transfer of Care (Signed)
Immediate Anesthesia Transfer of Care Note  Patient: Jennifer [name]  Procedure(s) Performed: ENTEROSCOPY (N/A ) COLONOSCOPY WITH PROPOFOL (N/A ) BIOPSY  Patient Location: PACU  Anesthesia Type:MAC  Level of Consciousness: awake, alert  and oriented  Airway & Oxygen Therapy: Patient Spontanous Breathing and Patient connected to face mask oxygen  Post-op Assessment: Report given to RN and Post -op Vital signs reviewed and stable  Post vital signs: Reviewed and stable  Last Vitals:  Vitals Value Taken Time  BP 166/73 05/01/20 0915  Temp    Pulse 95 05/01/20 0915  Resp 17 05/01/20 0915  SpO2 100 % 05/01/20 0915  Vitals shown include unvalidated device data.  Last Pain:  Vitals:   05/01/20 0910  TempSrc: (P) Oral  PainSc:          Complications: No apparent anesthesia complications

## 2020-05-01 NOTE — Op Note (Signed)
[facility] Patient Name: Jennifer [name] Procedure Date: 05/01/2020 MRN: [id number] Attending MD: Kathi Der , MD Date of Birth: Apr 21, 1925 CSN: [id number] Age: 86 Admit Type: Inpatient Procedure:                Small bowel enteroscopy Indications:              Gastrointestinal bleeding of unknown origin Providers:                Kathi Der, MD, Norman Clay, RN, Lawson Radar,                            Technician, Maricela Curet, CRNA Referring MD:              Medicines:                Sedation Administered by an Anesthesia Professional Complications:            No immediate complications. Estimated Blood Loss:     Estimated blood loss was minimal. Procedure:                Pre-Anesthesia Assessment:                           - Prior to the procedure, a History and Physical                            was performed, and patient medications and                            allergies were reviewed. The patient's tolerance of                            previous anesthesia was also reviewed. The risks                            and benefits of the procedure and the sedation                            options and risks were discussed with the patient.                            All questions were answered, and informed consent                            was obtained. Prior Anticoagulants: The patient has                            taken no previous anticoagulant or antiplatelet                            agents. ASA Grade Assessment: III - A patient with                            severe systemic disease. After reviewing the risks  and benefits, the patient was deemed in                            satisfactory condition to undergo the procedure.                           After obtaining informed consent, the endoscope was                            passed under direct vision. Throughout the                            procedure, the patient's  blood pressure, pulse, and                            oxygen saturations were monitored continuously. The                            PCF-H190DL ([id number]) Olympus pediatric colonscope                            was introduced through the mouth and advanced to                            the jejunum, to the 100 cm mark (from the                            incisors). The small bowel enteroscopy was                            accomplished without difficulty. The patient                            tolerated the procedure well. Scope In: Scope Out: Findings:      The Z-line was regular and was found 39 cm from the incisors.      Scattered mild inflammation was found in the gastric antrum. Biopsies       were taken with a cold forceps for histology.      The cardia and gastric fundus were normal on retroflexion.      There was no evidence of significant pathology in the entire examined       duodenum.      There was no evidence of significant pathology in the entire examined       portion of jejunum. Impression:               - Z-line regular, 39 cm from the incisors.                           - Gastritis. Biopsied.                           - Normal examined duodenum.                           - The examined portion of the  jejunum was normal. Recommendation:           - Perform a colonoscopy today. Procedure Code(s):        --- Professional ---                           [phone], Small intestinal endoscopy, enteroscopy                            beyond second portion of duodenum, not including                            ileum; with biopsy, single or multiple Diagnosis Code(s):        --- Professional ---                           K29.70, Gastritis, unspecified, without bleeding                           K92.2, Gastrointestinal hemorrhage, unspecified CPT copyright 2019 American Medical Association. All rights reserved. The codes documented in this report are preliminary and upon coder review  may  be revised to meet current compliance requirements. Otis Brace, MD Otis Brace, MD 05/01/2020 9:04:52 AM Number of Addenda: 0

## 2020-05-01 NOTE — Op Note (Signed)
[facility] Patient Name: Jennifer [name] Procedure Date: 05/01/2020 MRN: [id number] Attending MD: Otis Brace , MD Date of Birth: 1925-05-23 CSN: [id number] Age: 86 Admit Type: Inpatient Procedure:                Colonoscopy Indications:              Hematochezia Providers:                Otis Brace, MD, Burtis Junes, RN, Laverda Sorenson,                            Technician, Virgia Land, CRNA Referring MD:              Medicines:                Sedation Administered by an Anesthesia Professional Complications:            No immediate complications. Estimated Blood Loss:     Estimated blood loss was minimal. Procedure:                Pre-Anesthesia Assessment:                           - Prior to the procedure, a History and Physical                            was performed, and patient medications and                            allergies were reviewed. The patient's tolerance of                            previous anesthesia was also reviewed. The risks                            and benefits of the procedure and the sedation                            options and risks were discussed with the patient.                            All questions were answered, and informed consent                            was obtained. Prior Anticoagulants: The patient has                            taken no previous anticoagulant or antiplatelet                            agents. ASA Grade Assessment: III - A patient with                            severe systemic disease. After reviewing the risks  and benefits, the patient was deemed in                            satisfactory condition to undergo the procedure.                           After obtaining informed consent, the colonoscope                            was passed under direct vision. Throughout the                            procedure, the patient's blood pressure, pulse, and              oxygen saturations were monitored continuously. The                            PCF-H190DL ([id number]) Olympus pediatric colonscope                            was introduced through the anus and advanced to the                            the cecum, identified by appendiceal orifice and                            ileocecal valve. The colonoscopy was performed with                            moderate difficulty due to a redundant colon.                            Successful completion of the procedure was aided by                            applying abdominal pressure. The patient tolerated                            the procedure well. The quality of the bowel                            preparation was inadequate. Scope In: 8:33:04 AM Scope Out: 8:59:01 AM Scope Withdrawal Time: 0 hours 17 minutes 44 seconds  Total Procedure Duration: 0 hours 25 minutes 57 seconds  Findings:      Hemorrhoids were found on perianal exam.      Hematin (altered blood/coffee-ground-like material) was found in the       rectum, in the recto-sigmoid colon, in the sigmoid colon, in the       transverse colon and in the cecum.      A moderate amount of semi-liquid semi-solid stool was found in the       entire colon, interfering with visualization.      Multiple diverticula were found in the entire colon.      Not able to intubate TI despite of multple attempts.  Internal hemorrhoids were found during retroflexion. The hemorrhoids       were large. Impression:               - Preparation of the colon was inadequate.                           - Hemorrhoids found on perianal exam.                           - Blood in the rectum, in the recto-sigmoid colon,                            in the sigmoid colon, in the transverse colon and                            in the cecum.                           - Stool in the entire examined colon.                           - Diverticulosis in the entire examined  colon.                           - Internal hemorrhoids.                           - No specimens collected. Moderate Sedation:      Moderate (conscious) sedation was personally administered by an       anesthesia professional. The following parameters were monitored: oxygen       saturation, heart rate, blood pressure, and response to care. Recommendation:           - Return patient to hospital ward for ongoing care.                           - Full liquid diet.                           - Continue present medications. Procedure Code(s):        --- Professional ---                           [phone], Colonoscopy, flexible; diagnostic, including                            collection of specimen(s) by brushing or washing,                            when performed (separate procedure) Diagnosis Code(s):        --- Professional ---                           K64.8, Other hemorrhoids                           K62.5, Hemorrhage of anus  and rectum                           K92.2, Gastrointestinal hemorrhage, unspecified                           K92.1, Melena (includes Hematochezia)                           K57.30, Diverticulosis of large intestine without                            perforation or abscess without bleeding CPT copyright 2019 American Medical Association. All rights reserved. The codes documented in this report are preliminary and upon coder review may  be revised to meet current compliance requirements. Kathi Der, MD Kathi Der, MD 05/01/2020 9:10:09 AM Number of Addenda: 0

## 2020-05-01 NOTE — Anesthesia Preprocedure Evaluation (Signed)
Anesthesia Evaluation  Patient identified by MRN, date of birth, ID band Patient awake    Reviewed: Allergy & Precautions, NPO status , Patient's Chart, lab work & pertinent test results  Airway Mallampati: III  TM Distance: >3 FB Neck ROM: Full    Dental  (+) Teeth Intact, Dental Advisory Given   Pulmonary former smoker,    breath sounds clear to auscultation       Cardiovascular hypertension, Pt. on home beta blockers +CHF   Rhythm:Regular Rate:Normal     Neuro/Psych negative neurological ROS  negative psych ROS   GI/Hepatic Neg liver ROS, GERD  Medicated,  Endo/Other  diabetes, Type 2, Oral Hypoglycemic Agents  Renal/GU      Musculoskeletal   Abdominal Normal abdominal exam  (+)   Peds  Hematology   Anesthesia Other Findings - s/p TEVAR  Reproductive/Obstetrics                             Anesthesia Physical Anesthesia Plan  ASA: III  Anesthesia Plan: MAC   Post-op Pain Management:    Induction: Intravenous  PONV Risk Score and Plan: 0 and Propofol infusion  Airway Management Planned: Natural Airway and Nasal Cannula  Additional Equipment: None  Intra-op Plan:   Post-operative Plan:   Informed Consent: I have reviewed the patients History and Physical, chart, labs and discussed the procedure including the risks, benefits and alternatives for the proposed anesthesia with the patient or authorized representative who has indicated his/her understanding and acceptance.       Plan Discussed with: CRNA  Anesthesia Plan Comments:         Anesthesia Quick Evaluation

## 2020-05-01 NOTE — Anesthesia Postprocedure Evaluation (Signed)
Anesthesia Post Note  Patient: Jennifer [name]  Procedure(s) Performed: ENTEROSCOPY (N/A ) COLONOSCOPY WITH PROPOFOL (N/A ) BIOPSY     Patient location during evaluation: PACU Anesthesia Type: MAC Level of consciousness: awake and alert Pain management: pain level controlled Vital Signs Assessment: post-procedure vital signs reviewed and stable Respiratory status: spontaneous breathing, nonlabored ventilation, respiratory function stable and patient connected to nasal cannula oxygen Cardiovascular status: stable and blood pressure returned to baseline Postop Assessment: no apparent nausea or vomiting Anesthetic complications: no    Last Vitals:  Vitals:   05/01/20 0920 05/01/20 0940  BP:    Pulse: 96   Resp: (!) 22   Temp:  37.1 C  SpO2: 97%     Last Pain:  Vitals:   05/01/20 0940  TempSrc:   PainSc: 0-No pain                 Effie Berkshire

## 2020-05-01 NOTE — Progress Notes (Signed)
Patient ID: Jennifer [name], female   DOB: 1925-05-29, 86 y.o.   MRN: [id number]  PROGRESS NOTE    Jennifer [name]  FYB:[id number] DOB: March 18, 1925 DOA: 04/29/2020 PCP: Lorenda Ishihara, MD    Brief Narrative:  Jennifer [name] Jennifer [name] a 86 y.o.femalewith medical history significant of Dm 2, HTN,chronic iron deficiency anemia secondary to blood loss,,chronic diastolic CHF, HLD, osteoporosis, depression AAA status post repair, GERD,known history of severe degenerative disc disease  Presented withrectal bleeding for the past4days bright red blood per rectum once EMS arrived and noted about 100 mL of bright red blood in the commode. Patient with prior history of hemorrhoids and chronic blood loss. No abdominal pain no weakness no shortness of breath no nausea no vomiting Patient also endorsed some back painthat is what brought her into emergency department Patient is on naproxen  Had bleeding scan with area of bleeding noted and status post IR angiography without being able to embolize any bleeders. Now status post EGD and colonoscopy.  Findings are consistent with diverticular bleed.   Assessment & Plan:   Active Problems:   CKD (chronic kidney disease), stage III   Diabetes (HCC)   CHF (congestive heart failure) (HCC)   HTN (hypertension)   Hyperlipidemia   Acute blood loss anemia   Lower GI bleed   Diastolic CHF (HCC)   Chest pain   Abnormal ECG   Hyperkalemia   Prolonged QT interval  Lower GI bleed -- Suspect Lower Gi source No hx of PUD, nomelena--GI consult, s/p 4 u PRBCs  - serial CBC--stable x 6 hours.  - Establish at least 2 PIV and fluid resuscitate   - Bleeding scan stat -showed source for IR attempt at embolization per GI and IR discussion--will hydrate to protect renal function--unable to stop bleeding.  -Now status post EGD and colonoscopy without good prep and probable diverticular bleeding but could not treat  CKD (chronic kidney disease), stage  III-avoid nephrotoxic medications--getting contrast for IR emolization--begin hydration and monitor closely, avoid other nephrotoxic agents Serum Cr is down to 1.09 today (1.42 on 5/30)--IVF stopped  HTN (hypertension)-hold home medication given soft BP's eval as needed--BP is now elevated and her BP meds were resumed  Hyperlipidemia -stable continue home medication  Acute blood loss anemia-transfuse 4 units and repeat CBC transfuse as needed  Diastolic CHF (HCC)-gently rehydrate appears to be fluid down  Chest pain-cycle cardiac enzymes monitor repeat EKG. Could be demand ischemia in the setting of GI blood loss versus musculoskeletal since chest pain is reproducible by palpation but EKG does show some ischemic changes more consistent with demand--reviewed with cards--no good options given bleeding, think it is demand ischemia--could add B-blocker if tolerates Obtain echo--EF is 55-60% with Grade 1 diastolic dysfunction, mitral valve degeneration with thickened valve and annular calcification with mild MR, moderate AR  Hyperkalemia -resolved  Prolonged QT interval-- will monitor on tele avoid QT prolonging medications, rehydrate correct electrolytes  Back pain -appears musculoskeletal at this time, Pt with hx of sevre DDD  Dm 2 -- Order Sensitive SSI    DVT prophylaxis: SCD/Compression stockings Code Status: DNR  Family Communication: Husband at bedside, RN reviewed with daughter, has HCPOA and Living will confirming DNR. She would like home health as patient and husband are in their 90s. Disposition Plan: Home (PT/OT eval)   Consultants:   GI  IR  Procedures:  EGD  Colonoscopy  SMA arteriography  Antimicrobials: Anti-infectives (From admission, onward)   None  Subjective: Feels well. Old bloody stools in PACU after colonoscopy. Stable CBC with Hgb at 7.9 now.  Objective: Vitals:   05/01/20 0910 05/01/20 0920 05/01/20 0940 05/01/20  1230  BP: (!) 166/73   (!) 167/68  Pulse: 95 96  83  Resp: 17 (!) 22  13  Temp: (!) 97.3 F (36.3 C)  98.8 F (37.1 C)   TempSrc: Oral     SpO2: 100% 97%  97%  Weight:      Height:        Intake/Output Summary (Last 24 hours) at 05/01/2020 1234 Last data filed at 05/01/2020 1043 Gross per 24 hour  Intake 2906.15 ml  Output 600 ml  Net 2306.15 ml   Filed Weights   04/30/20 0143  Weight: 63.8 kg    Examination:  General exam: Appears calm and comfortable  Respiratory system: Clear to auscultation. Respiratory effort normal. Cardiovascular system: S1 & S2 heard, RRR.  Gastrointestinal system: Abdomen is nondistended, soft and nontender.  Central nervous system: Alert and oriented. No focal neurological deficits. Extremities: Symmetric  Skin: No rashes Psychiatry: Judgement and insight appear normal. Mood & affect appropriate.     Data Reviewed: I have personally reviewed following labs and imaging studies  CBC: Recent Labs  Lab 04/29/20 2204 04/30/20 0615 04/30/20 1654 04/30/20 2219 05/01/20 0412  WBC 9.4 8.2 10.2 8.9 8.3  NEUTROABS  --  5.1  --   --   --   HGB 7.1* 7.5* 8.4* 7.9* 7.9*  HCT 22.5* 23.7* 26.2* 24.8* 24.7*  MCV 94.9 93.3 92.3 93.2 93.2  PLT 175 122* 102* 93* 95*   Basic Metabolic Panel: Recent Labs  Lab 04/29/20 2204 04/30/20 0615 05/01/20 1034  NA 141 142 141  K 5.2* 4.5 3.7  CL 110 114* 117*  CO2 23 20* 21*  GLUCOSE 158* 136* 119*  BUN 47* 45* 19  CREATININE 1.56* 1.42* 1.09*  CALCIUM 9.2 8.2* 7.8*  MG  --  2.2  --   PHOS  --  4.3  --    GFR: Estimated Creatinine Clearance: 29.6 mL/min (A) (by C-G formula based on SCr of 1.09 mg/dL (H)). Liver Function Tests: Recent Labs  Lab 04/29/20 2204 04/30/20 0615  AST 13* 11*  ALT 10 9  ALKPHOS 42 28*  BILITOT 0.6 0.7  PROT 6.2* 5.1*  ALBUMIN 3.7 3.1*   Coagulation Profile: Recent Labs  Lab 04/29/20 2204  INR 1.1   HbA1C: Recent Labs    04/30/20 1042  HGBA1C 6.3*    CBG: Recent Labs  Lab 04/30/20 1137 04/30/20 1643 04/30/20 1942 04/30/20 2316 05/01/20 0330  GLUCAP 117* 113* 91 93 107*   Thyroid Function Tests: Recent Labs    04/30/20 0615  TSH 0.959   Anemia Panel: Recent Labs    04/29/20 2332  VITAMINB12 651  FOLATE 41.2  FERRITIN 49  TIBC 318  IRON 41  RETICCTPCT 2.1     Recent Results (from the past 240 hour(s))  SARS Coronavirus 2 by RT PCR (hospital order, performed in [facility] hospital lab) Nasopharyngeal Nasopharyngeal Swab     Status: None   Collection Time: 04/30/20 12:30 AM   Specimen: Nasopharyngeal Swab  Result Value Ref Range Status   SARS Coronavirus 2 NEGATIVE NEGATIVE Final    Comment: (NOTE) SARS-CoV-2 target nucleic acids are NOT DETECTED. The SARS-CoV-2 RNA is generally detectable in upper and lower respiratory specimens during the acute phase of infection. The lowest concentration of SARS-CoV-2 viral copies this assay  can detect is 250 copies / mL. A negative result does not preclude SARS-CoV-2 infection and should not be used as the sole basis for treatment or other patient management decisions.  A negative result may occur with improper specimen collection / handling, submission of specimen other than nasopharyngeal swab, presence of viral mutation(s) within the areas targeted by this assay, and inadequate number of viral copies (<250 copies / mL). A negative result must be combined with clinical observations, patient history, and epidemiological information. Fact Sheet for Patients:   StrictlyIdeas.no Fact Sheet for Healthcare Providers: BankingDealers.co.za This test is not yet approved or cleared  by the [place] FDA and has been authorized for detection and/or diagnosis of SARS-CoV-2 by FDA under an Emergency Use Authorization (EUA).  This EUA will remain in effect (meaning this test can be used) for the duration of the COVID-19 declaration  under Section 564(b)(1) of the Act, 21 U.S.C. section 360bbb-3(b)(1), unless the authorization is terminated or revoked sooner. Performed at [facility], [place] [address], [place], [place] [address]   MRSA PCR Screening     Status: None   Collection Time: 04/30/20  1:57 AM   Specimen: Nasal Mucosa; Nasopharyngeal  Result Value Ref Range Status   MRSA by PCR NEGATIVE NEGATIVE Final    Comment:        The GeneXpert MRSA Assay (FDA approved for NASAL specimens only), is one component of a comprehensive MRSA colonization surveillance program. It is not intended to diagnose MRSA infection nor to guide or monitor treatment for MRSA infections. Performed at [facility], [place] [address], [place], [place] [address]       Radiology Studies: NM GI Blood Loss  Result Date: 04/30/2020 CLINICAL DATA:  86 year old female with GI bleed. EXAM: NUCLEAR MEDICINE GASTROINTESTINAL BLEEDING SCAN TECHNIQUE: Sequential abdominal images were obtained following intravenous administration of Tc-7m labeled red blood cells. RADIOPHARMACEUTICALS:  20.1 mCi Tc-41m pertechnetate in-vitro labeled red cells. COMPARISON:  05/08/2015 CT and 04/26/2015 GI bleeding study. FINDINGS: The patient could only tolerate 1-1/2 hours of scanning. Abnormal activity is initially identified within the mid-upper RIGHT abdomen and extends to the LEFT side. Given appearance and location of bowel loops on prior CT, favor GI bleed originating from proximal small bowel although it is difficult to entirely exclude active bleeding originating within the colon at the hepatic flexure. IMPRESSION: Active GI bleeding.  Favor small bowel bleed as discussed above. Electronically Signed   By: Margarette Canada M.D.   On: 04/30/2020 14:15   IR Angiogram Visceral Selective  Result Date: 04/30/2020 INDICATION: Acute GI bleed requiring blood transfusion. Positive nuclear medicine bleeding scan in the region of small  bowel and or proximal colon. History of prior abdominal aortic aneurysm resection with placement of aortic tube graft and prior TEVAR. EXAM: 1. ULTRASOUND GUIDANCE FOR VASCULAR ACCESS OF THE RIGHT COMMON FEMORAL ARTERY 2. SELECTIVE ARTERIOGRAPHY OF THE SUPERIOR MESENTERIC ARTERY MEDICATIONS: 100 mcg intra-arterial nitroglycerin administered during the procedure. ANESTHESIA/SEDATION: Moderate (conscious) sedation was employed during this procedure. A total of Versed 2.5 mg and Fentanyl 100 mcg was administered intravenously. Moderate Sedation Time: 38 minutes. The patient's level of consciousness and vital signs were monitored continuously by radiology nursing throughout the procedure under my direct supervision. CONTRAST:  99 mL Visipaque 320 FLUOROSCOPY TIME:  Fluoroscopy Time: 54 seconds.  368 mGy. COMPLICATIONS: None immediate. PROCEDURE: Informed consent was obtained from the patient following explanation of the procedure, risks, benefits and alternatives. The patient understands, agrees  and consents for the procedure. All questions were addressed. A time out was performed prior to the initiation of the procedure. Maximal barrier sterile technique utilized including caps, mask, sterile gowns, sterile gloves, large sterile drape, hand hygiene, and chlorhexidine prep. Ultrasound was used to confirm patency of the right common femoral artery. Under direct ultrasound guidance, access of the artery was performed with a 21 gauge needle and micropuncture set. After establishing arterial access, a 5 French sheath was placed over a guidewire. A 5 French Cobra catheter was advanced into the abdominal aorta. This was used to selectively catheterize the superior mesenteric artery. The catheter was further advanced into the trunk of the SMA over a guidewire. Selective arteriography of the SMA was then performed in multiple projections prior to, and following, administration of intra-arterial nitroglycerin. Additional  arteriography was performed through the catheter at the level of the distal aorta. The catheter was removed. Hemostasis was obtained after sheath removal utilizing the Cordis ExoSeal device and manual compression. FINDINGS: Selective superior mesenteric arteriography demonstrates normal patency of supply to the small bowel and colon without evidence of active bleeding, arteriovenous malformation, pseudoaneurysm or other vascular abnormality. There is incidental replacement of the right hepatic artery off of the proximal SMA. A prominent Arc of Riolan is present extending into the IMA territory and supplying the descending colon and rectum. Additional injection at the level of the distal aorta confirms ligation and sacrifice of the inferior mesenteric artery at the time of abdominal aortic aneurysm repair. The distal segment of the aortic tube graft shows normal patency. IMPRESSION: Selective superior mesenteric arteriography does not demonstrate evidence of active bleeding source, AV malformation or other vascular abnormality. Embolization was not able to be performed. The SMA also supplies IMA territory via an Arc of Riolan after prior IMA ligation. Electronically Signed   By: Irish Lack M.D.   On: 04/30/2020 16:56   IR US Guide Vasc Access Right  Result Date: 04/30/2020 INDICATION: Acute GI bleed requiring blood transfusion. Positive nuclear medicine bleeding scan in the region of small bowel and or proximal colon. History of prior abdominal aortic aneurysm resection with placement of aortic tube graft and prior TEVAR. EXAM: 1. ULTRASOUND GUIDANCE FOR VASCULAR ACCESS OF THE RIGHT COMMON FEMORAL ARTERY 2. SELECTIVE ARTERIOGRAPHY OF THE SUPERIOR MESENTERIC ARTERY MEDICATIONS: 100 mcg intra-arterial nitroglycerin administered during the procedure. ANESTHESIA/SEDATION: Moderate (conscious) sedation was employed during this procedure. A total of Versed 2.5 mg and Fentanyl 100 mcg was administered  intravenously. Moderate Sedation Time: 38 minutes. The patient's level of consciousness and vital signs were monitored continuously by radiology nursing throughout the procedure under my direct supervision. CONTRAST:  99 mL Visipaque 320 FLUOROSCOPY TIME:  Fluoroscopy Time: 54 seconds.  368 mGy. COMPLICATIONS: None immediate. PROCEDURE: Informed consent was obtained from the patient following explanation of the procedure, risks, benefits and alternatives. The patient understands, agrees and consents for the procedure. All questions were addressed. A time out was performed prior to the initiation of the procedure. Maximal barrier sterile technique utilized including caps, mask, sterile gowns, sterile gloves, large sterile drape, hand hygiene, and chlorhexidine prep. Ultrasound was used to confirm patency of the right common femoral artery. Under direct ultrasound guidance, access of the artery was performed with a 21 gauge needle and micropuncture set. After establishing arterial access, a 5 French sheath was placed over a guidewire. A 5 French Cobra catheter was advanced into the abdominal aorta. This was used to selectively catheterize the superior mesenteric artery. The catheter  was further advanced into the trunk of the SMA over a guidewire. Selective arteriography of the SMA was then performed in multiple projections prior to, and following, administration of intra-arterial nitroglycerin. Additional arteriography was performed through the catheter at the level of the distal aorta. The catheter was removed. Hemostasis was obtained after sheath removal utilizing the Cordis ExoSeal device and manual compression. FINDINGS: Selective superior mesenteric arteriography demonstrates normal patency of supply to the small bowel and colon without evidence of active bleeding, arteriovenous malformation, pseudoaneurysm or other vascular abnormality. There is incidental replacement of the right hepatic artery off of the  proximal SMA. A prominent Arc of Riolan is present extending into the IMA territory and supplying the descending colon and rectum. Additional injection at the level of the distal aorta confirms ligation and sacrifice of the inferior mesenteric artery at the time of abdominal aortic aneurysm repair. The distal segment of the aortic tube graft shows normal patency. IMPRESSION: Selective superior mesenteric arteriography does not demonstrate evidence of active bleeding source, AV malformation or other vascular abnormality. Embolization was not able to be performed. The SMA also supplies IMA territory via an Arc of Riolan after prior IMA ligation. Electronically Signed   By: Irish Lack M.D.   On: 04/30/2020 16:56   ECHOCARDIOGRAM COMPLETE  Result Date: 04/30/2020    ECHOCARDIOGRAM REPORT   Patient Name:   Jennifer [name] Date of Exam: 04/30/2020 Medical Rec #:  [id number]     Height:       63.0 in Accession #:    [id number]    Weight:       140.7 lb Date of Birth:  Jul 27, 1925     BSA:          1.665 m Patient Age:    89 years      BP:           119/55 mmHg Patient Gender: F             HR:           88 bpm. Exam Location:  Inpatient Procedure: 2D Echo Indications:    Chest Pain 786.50 / R07.9  History:        Patient has prior history of Echocardiogram examinations, most                 recent 04/01/2015. Risk Factors:Diabetes, Hypertension and                 Dyslipidemia. CKD                 History of aortic aneurysm.  Sonographer:    Leeroy Bock Turrentine Referring Phys: [id number] ANASTASSIA DOUTOVA IMPRESSIONS  1. Left ventricular ejection fraction, by estimation, is 55 to 60%. The left ventricle has normal function. The left ventricle has no regional wall motion abnormalities. There is mild left ventricular hypertrophy. Left ventricular diastolic parameters are consistent with Grade I diastolic dysfunction (impaired relaxation).  2. Right ventricular systolic function is normal. The right ventricular size is normal.  Tricuspid regurgitation signal is inadequate for assessing PA pressure.  3. The mitral valve is degenerative, mildly thickened and with moderate annular calcification. Mild mitral valve regurgitation.  4. The aortic valve is tricuspid. Aortic valve regurgitation is moderate. Mild to moderate aortic valve sclerosis/calcification is present, without any evidence of aortic stenosis.  5. The inferior vena cava is normal in size with greater than 50% respiratory variability, suggesting right atrial pressure of 3 mmHg.  6. Distal aortic  arch and descending aorta not well seen - history of stent graft repair of aneurysm. FINDINGS  Left Ventricle: Left ventricular ejection fraction, by estimation, is 55 to 60%. The left ventricle has normal function. The left ventricle has no regional wall motion abnormalities. The left ventricular internal cavity size was small. There is mild left ventricular hypertrophy. Left ventricular diastolic parameters are consistent with Grade I diastolic dysfunction (impaired relaxation). Right Ventricle: The right ventricular size is normal. No increase in right ventricular wall thickness. Right ventricular systolic function is normal. Tricuspid regurgitation signal is inadequate for assessing PA pressure. Left Atrium: Left atrial size was normal in size. Right Atrium: Right atrial size was normal in size. Pericardium: There is no evidence of pericardial effusion. Presence of pericardial fat pad. Mitral Valve: The mitral valve is degenerative in appearance. There is mild calcification of the mitral valve leaflet(s). Moderate mitral annular calcification. Mild mitral valve regurgitation. Tricuspid Valve: The tricuspid valve is grossly normal. Tricuspid valve regurgitation is mild. Aortic Valve: The aortic valve is tricuspid. Aortic valve regurgitation is moderate. Mild to moderate aortic valve sclerosis/calcification is present, without any evidence of aortic stenosis. Moderate aortic valve  annular calcification. Pulmonic Valve: The pulmonic valve was grossly normal. Pulmonic valve regurgitation is trivial. Aorta: The aortic root is normal in size and structure. Venous: The inferior vena cava is normal in size with greater than 50% respiratory variability, suggesting right atrial pressure of 3 mmHg. IAS/Shunts: The interatrial septum appears to be lipomatous. No atrial level shunt detected by color flow Doppler.  LEFT VENTRICLE PLAX 2D LVIDd:         3.60 cm  Diastology LVIDs:         2.80 cm  LV e' lateral:   5.33 cm/s LV PW:         1.00 cm  LV E/e' lateral: 15.2 LV IVS:        1.00 cm  LV e' medial:    4.90 cm/s LVOT diam:     2.00 cm  LV E/e' medial:  16.6 LV SV:         43 LV SV Index:   26 LVOT Area:     3.14 cm  RIGHT VENTRICLE RV S prime:     10.10 cm/s LEFT ATRIUM           Index       RIGHT ATRIUM           Index LA Vol (A4C): 28.0 ml 16.82 ml/m RA Area:     11.80 cm                                   RA Volume:   25.00 ml  15.02 ml/m  AORTIC VALVE LVOT Vmax:   100.00 cm/s LVOT Vmean:  66.500 cm/s LVOT VTI:    0.138 m  AORTA Ao Root diam: 3.20 cm MITRAL VALVE MV Area (PHT): 4.53 cm     SHUNTS MV Decel Time: 168 msec     Systemic VTI:  0.14 m MV E velocity: 81.10 cm/s   Systemic Diam: 2.00 cm MV A velocity: 100.00 cm/s MV E/A ratio:  0.81 Nona Dell MD Electronically signed by Nona Dell MD Signature Date/Time: 04/30/2020/10:53:52 AM    Final      Scheduled Meds: . sodium chloride   Intravenous Once  . sodium chloride   Intravenous Once  . atorvastatin  20 mg  Oral Daily  . Chlorhexidine Gluconate Cloth  6 each Topical Daily  . hydrocortisone  25 mg Rectal BID  . insulin aspart  0-9 Units Subcutaneous Q4H  . lidocaine   Topical Q8H  . mouth rinse  15 mL Mouth Rinse BID  . pantoprazole (PROTONIX) IV  40 mg Intravenous Q12H  . sodium chloride flush  3 mL Intravenous Q12H   Continuous Infusions: . sodium chloride 150 mL/hr at 04/30/20 1701  . sodium chloride Stopped  (05/01/20 0718)     LOS: 1 day    Reva Boresanya S Iaan Oregel, MD 05/01/2020 12:34 PM [phone] [facility] If 7PM-7AM, please contact night-coverage 05/01/2020, 12:34 PM

## 2020-05-02 ENCOUNTER — Encounter: Payer: Self-pay | Admitting: *Deleted

## 2020-05-02 ENCOUNTER — Inpatient Hospital Stay (HOSPITAL_COMMUNITY): Payer: Medicare HMO

## 2020-05-02 DIAGNOSIS — R609 Edema, unspecified: Secondary | ICD-10-CM

## 2020-05-02 LAB — CBC
HCT: 24 % — ABNORMAL LOW (ref 36.0–46.0)
Hemoglobin: 7.5 g/dL — ABNORMAL LOW (ref 12.0–15.0)
MCH: 29.8 pg (ref 26.0–34.0)
MCHC: 31.3 g/dL (ref 30.0–36.0)
MCV: 95.2 fL (ref 80.0–100.0)
Platelets: 105 10*3/uL — ABNORMAL LOW (ref 150–400)
RBC: 2.52 MIL/uL — ABNORMAL LOW (ref 3.87–5.11)
RDW: 17.3 % — ABNORMAL HIGH (ref 11.5–15.5)
WBC: 7.6 10*3/uL (ref 4.0–10.5)
nRBC: 0 % (ref 0.0–0.2)

## 2020-05-02 LAB — GLUCOSE, CAPILLARY: Glucose-Capillary: 106 mg/dL — ABNORMAL HIGH (ref 70–99)

## 2020-05-02 MED ORDER — LIP MEDEX EX OINT
TOPICAL_OINTMENT | CUTANEOUS | Status: AC
  Filled 2020-05-02: qty 7

## 2020-05-02 NOTE — Progress Notes (Signed)
Orthopedic Tech Progress Note Patient Details:  Jennifer [name] 1925/02/19 [id number]  Ortho Devices Type of Ortho Device: Sling arm elevator Ortho Device/Splint Location: LUE Ortho Device/Splint Interventions: Ordered, Application, Adjustment   Post Interventions Patient Tolerated: Well Instructions Provided: Care of device   Ancil Linsey 05/02/2020, 12:31 PM

## 2020-05-02 NOTE — TOC Initial Note (Signed)
Transition of Care [facility]) - Initial/Assessment Note    Patient Details  Name: Jennifer [name] MRN: [id number] Date of Birth: 1925/05/10  Transition [facility]) CM/SW Contact:    Golda Acre, RN Phone Number: 05/02/2020, 7:41 AM  Clinical Narrative:                 Gi bleed, from home has pcp, hgb 7.5,egd and colonscopy lower gi bleed, diverticulosis without bleeding or infection.  Plan to return home once stable.  Expected Discharge Plan: Home/Self Care Barriers to Discharge: Continued Medical Work up   Patient Goals and CMS Choice Patient states their goals for this hospitalization and ongoing recovery are:: to go hoime CMS Medicare.gov Compare Post Acute Care list provided to:: Patient    Expected Discharge Plan and Services Expected Discharge Plan: Home/Self Care   Discharge Planning Services: CM Consult   Living arrangements for the past 2 months: Single Family Home                                      Prior Living Arrangements/Services Living arrangements for the past 2 months: Single Family Home Lives with:: Spouse Patient language and need for interpreter reviewed:: No        Need for Family Participation in Patient Care: Yes (Comment) Care giver support system in place?: Yes (comment)   Criminal Activity/Legal Involvement Pertinent to Current Situation/Hospitalization: No - Comment as needed  Activities of Daily Living      Permission Sought/Granted                  Emotional Assessment Appearance:: Appears stated age Attitude/Demeanor/Rapport: Engaged Affect (typically observed): Calm Orientation: : Oriented to Self, Oriented to Place, Oriented to  Time, Oriented to Situation Alcohol / Substance Use: Not Applicable Psych Involvement: No (comment)  Admission diagnosis:  Lower GI bleed [K92.2] Gastrointestinal hemorrhage, unspecified gastrointestinal hemorrhage type [K92.2] Patient Active Problem List   Diagnosis Date Noted  .  Chest pain 04/30/2020  . Abnormal ECG 04/30/2020  . Hyperkalemia 04/30/2020  . Prolonged QT interval 04/30/2020  . Anemia 10/08/2015  . Diastolic CHF (HCC) 10/08/2015  . Gastrointestinal hemorrhage with melena   . Pressure ulcer 04/27/2015  . Lower GI bleed 04/24/2015  . GI bleed 04/24/2015  . Acute respiratory distress 03/31/2015  . Hypertensive urgency 03/31/2015  . Anemia, iron deficiency 03/02/2015  . Klebsiella cystitis 03/02/2015  . Abscess of abdominal wall 02/28/2015  . Abdominal abscess 02/28/2015  . Abdominal wall abscess at site of surgical wound 02/28/2015  . Acute blood loss anemia 10/05/2014  . Thoracic aortic aneurysm (HCC) 09/27/2014  . Dissecting aortic aneurysm (any part), thoracic (HCC) 09/27/2014  . Aneurysm, thoracic aortic (HCC) 09/26/2014  . Hyperlipidemia 09/16/2014  . Diabetes (HCC) 09/13/2014  . CHF (congestive heart failure) (HCC) 09/13/2014  . HTN (hypertension) 09/13/2014  . Physical deconditioning 09/12/2014  . 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 Acute on chronic diastolic CHF (congestive heart failure), NYHA class 3 (HCC) 09/08/2014  . Left sided chest pain 09/08/2014  . CKD (chronic kidney disease), stage III 09/08/2014  . Acute respiratory failure with hypoxia (HCC) 09/01/2014  . Acute pulmonary edema (HCC) 09/01/2014  . Cardiogenic shock (HCC) 09/01/2014  . Intramural aortic hematoma (HCC) 08/28/2014   PCP:  Lorenda Ishihara, MD Pharmacy:   [facility] [phone] - [place], [place] - [address]  [place] [address] Phone: [phone] Fax: [facility], [address] [place] [address] Phone: [phone] Fax: [phone]     Social Determinants of Health (SDOH) Interventions    Readmission Risk Interventions No flowsheet data found.

## 2020-05-02 NOTE — Plan of Care (Signed)

## 2020-05-02 NOTE — Progress Notes (Signed)
Upper venous duplex       has been completed. Preliminary results can be found under CV proc through chart review. Jennifer [name], BS, RDMS, RVT   

## 2020-05-02 NOTE — Evaluation (Signed)
Physical Therapy Evaluation Patient Details Name: Jennifer [name] MRN: [id number] DOB: Jan 04, 1925 Today's Date: 05/02/2020   History of Present Illness  Pt is a 86 yo female with PMH including DM2, HTN, anemia, CHF, HLD, osteoporosis, depression, AAA and repair, GERD, and DDD.  Pt presented with GIB and is s/p EGD and colonscopy with findings consistent with diverticular bleed. Current Hgb is 7.5  Clinical Impression  Pt admitted with above diagnosis. Pt was able to transfer and ambulate with min guard to min A level.  She required min cues for safety and assist with ADLs due to lines/leads.  All VSS during therapy.  Pt lives with spouse who is also in his 42's.  Pt had very mild confusion but answered most questions appropriately.   Pt currently with functional limitations due to the deficits listed below (see PT Problem List). Pt will benefit from skilled PT to increase their independence and safety with mobility to allow discharge to the venue listed below.  If pt's spouse able to provide adequate 24 hr support, should be able to return home with HHPT.  Spouse was not present at eval - pt reports his sister just died and he is handling some arrangements.      Follow Up Recommendations Supervision/Assistance - 24 hour;Home health PT    Equipment Recommendations  None recommended by PT(has RW)    Recommendations for Other Services       Precautions / Restrictions Precautions Precautions: Fall Restrictions Weight Bearing Restrictions: No      Mobility  Bed Mobility Overal bed mobility: Needs Assistance Bed Mobility: Supine to Sit     Supine to sit: Min assist     General bed mobility comments: min A to scoot forward  Transfers Overall transfer level: Needs assistance Equipment used: Rolling walker (2 wheeled) Transfers: Sit to/from Stand Sit to Stand: Min guard         General transfer comment: performed x 2; pt with good hand placement  Ambulation/Gait Ambulation/Gait  assistance: Min guard Gait Distance (Feet): 100 Feet Assistive device: Rolling walker (2 wheeled) Gait Pattern/deviations: Step-through pattern;Trunk flexed     General Gait Details: overall fair speed for pt's age; no LOB; did need min cues for posture and RW proximity  Stairs            Wheelchair Mobility    Modified Rankin (Stroke Patients Only)       Balance Overall balance assessment: Needs assistance Sitting-balance support: No upper extremity supported Sitting balance-Leahy Scale: Good     Standing balance support: Bilateral upper extremity supported Standing balance-Leahy Scale: Fair Standing balance comment: required RW                             Pertinent Vitals/Pain Pain Assessment: No/denies pain    Home Living Family/patient expects to be discharged to:: Unsure Living Arrangements: Spouse/significant other Available Help at Discharge: Family;Available 24 hours/day Type of Home: House Home Access: Stairs to enter Entrance Stairs-Rails: None Entrance Stairs-Number of Steps: 1 onto porch then threshold Home Layout: One level Home Equipment: Walker - 2 wheels      Prior Function Level of Independence: Independent with assistive device(s)         Comments: Pt reports could ambulate short community distances with RW and perform ADLs.  Mild confusion at times.     Hand Dominance        Extremity/Trunk Assessment   Upper Extremity Assessment Upper  Extremity Assessment: LUE deficits/detail;RUE deficits/detail RUE Deficits / Details: Overall ROM WFL and MMT grossly 4-5/5 throughout LUE Deficits / Details: ROM WFL; MMT 2-3/5 with with significant edema throughout L UE (discussed with RN)    Lower Extremity Assessment Lower Extremity Assessment: Overall WFL for tasks assessed    Cervical / Trunk Assessment Cervical / Trunk Assessment: Kyphotic  Communication   Communication: HOH  Cognition Arousal/Alertness:  Awake/alert Behavior During Therapy: Anxious Overall Cognitive Status: No family/caregiver present to determine baseline cognitive functioning                                 General Comments: Pt with mild confusion at times and difficulty occasionally with finding words.  Pt very concerned over modesty (assured we would keep her covered) and cost of being in hospital with everyone coming to see her (try to reassure discussing everything goes through insurance and we are providing the care she needs at this time to get well).      General Comments General comments (skin integrity, edema, etc.): VSS    Exercises     Assessment/Plan    PT Assessment Patient needs continued PT services  PT Problem List Decreased strength;Decreased mobility;Decreased range of motion;Decreased safety awareness;Decreased activity tolerance;Cardiopulmonary status limiting activity;Decreased cognition;Decreased balance;Decreased knowledge of use of DME       PT Treatment Interventions DME instruction;Therapeutic activities;Gait training;Therapeutic exercise;Patient/family education;Stair training;Balance training;Functional mobility training    PT Goals (Current goals can be found in the Care Plan section)  Acute Rehab PT Goals Patient Stated Goal: return home PT Goal Formulation: With patient Time For Goal Achievement: 05/16/20 Potential to Achieve Goals: Good    Frequency Min 3X/week   Barriers to discharge   Spouse also in 90's, may need extra support at home    Co-evaluation               AM-PAC PT "6 Clicks" Mobility  Outcome Measure Help needed turning from your back to your side while in a flat bed without using bedrails?: A Little Help needed moving from lying on your back to sitting on the side of a flat bed without using bedrails?: A Little Help needed moving to and from a bed to a chair (including a wheelchair)?: A Little Help needed standing up from a chair using  your arms (e.g., wheelchair or bedside chair)?: A Little Help needed to walk in hospital room?: None Help needed climbing 3-5 steps with a railing? : A Little 6 Click Score: 19    End of Session Equipment Utilized During Treatment: Gait belt Activity Tolerance: Patient tolerated treatment well Patient left: with chair alarm set;in chair;with call bell/phone within reach Nurse Communication: Mobility status;Other (comment)(Edema and weakness in L UE - nurse reports no hx of IV infiltration and she will notify MD) PT Visit Diagnosis: Unsteadiness on feet (R26.81);Muscle weakness (generalized) (M62.81)    Time: 6967-8938 PT Time Calculation (min) (ACUTE ONLY): 29 min   Charges:   PT Evaluation $PT Eval Moderate Complexity: 1 Mod          Royetta Asal, PT Acute Rehab Services Pager [phone] [facility] [phone] [facility] [phone]   Rayetta Humphrey 05/02/2020, 11:49 AM

## 2020-05-02 NOTE — Evaluation (Signed)
Occupational Therapy Evaluation Patient Details Name: Jennifer [name] MRN: [id number] DOB: 1925/10/11 Today's Date: 05/02/2020    History of Present Illness Pt is a 86 yo female with PMH including DM2, HTN, anemia, CHF, HLD, osteoporosis, depression, AAA and repair, GERD, and DDD.  Pt presented with GIB and is s/p EGD and colonscopy with findings consistent with diverticular bleed. Current Hgb is 7.5   Clinical Impression   RN okay for OT eval; Pt presenting seated in recliner pleasant and willing to participate in therapy session. Pt reports she lives with her spouse and overall is mod independent with ADL tasks, reports she has an aide who assists with bathing 1x/wk and some iADL. Pt overall requiring minguard assist for functional transfers using RW, requiring minA for seated UB ADL, and up to [facility] for LB ADL. Pt continues to have LUE edema and noted bruising to L wrist impacting her ability to use UE easily during functional tasks, pt denies falling (RN reports currently attempting elevation to see if this helps prior to completion of imaging). Pt with impaired cognition, easily distracted and tangential at times. VSS throughout. She will benefit from continued acute OT services to further address LUE deficits and to maximize pt's overall safety and independence with ADL and mobility. Given pt's current cognition recommend pt have 24hr supervision/assist if pt to discharge home to ensure safety with ADL/mobility tasks, if spouse/family not able to provide adequate assist pt may require ST SNF. She may also be a good candidate for the [facility] program with [facility] if eligible. Will continue to follow.     Follow Up Recommendations  Home health OT;Supervision/Assistance - 24 hour(HH with 24hr vs SNF)    Equipment Recommendations  None recommended by OT           Precautions / Restrictions Precautions Precautions: Fall Restrictions Weight Bearing Restrictions: No      Mobility Bed  Mobility               General bed mobility comments: OOB in recliner upon arrival   Transfers Overall transfer level: Needs assistance Equipment used: Rolling walker (2 wheeled) Transfers: Sit to/from Stand Sit to Stand: Min guard         General transfer comment: for safety and balance    Balance Overall balance assessment: Needs assistance Sitting-balance support: No upper extremity supported Sitting balance-Leahy Scale: Good     Standing balance support: Bilateral upper extremity supported Standing balance-Leahy Scale: Fair Standing balance comment: required RW                           ADL either performed or assessed with clinical judgement   ADL Overall ADL's : Needs assistance/impaired Eating/Feeding: Set up;Sitting Eating/Feeding Details (indicate cue type and reason): setup with lunch tray end of session, increased time/effort as pt with difficulty using LUE  Grooming: Set up;Sitting   Upper Body Bathing: Minimal assistance;Sitting   Lower Body Bathing: Moderate assistance;Sitting/lateral leans;Sit to/from stand   Upper Body Dressing : Minimal assistance;Sitting   Lower Body Dressing: Moderate assistance;Sit to/from stand;Sitting/lateral leans Lower Body Dressing Details (indicate cue type and reason): pt having trouble using LUE and difficulty manipulating items (such as socks); minguard for standin balance  Toilet Transfer: Minimal assistance;Stand-pivot;RW Toilet Transfer Details (indicate cue type and reason): simulated via transfer to/from recliner  Toileting- Clothing Manipulation and Hygiene: Minimal assistance;Sit to/from stand       Functional mobility during ADLs: Min guard;Minimal assistance;Rolling  walker       Vision         Perception     Praxis      Pertinent Vitals/Pain Pain Assessment: Faces Faces Pain Scale: Hurts a little bit Pain Location: L wrist Pain Descriptors / Indicators: Discomfort Pain  Intervention(s): Limited activity within patient's tolerance;Monitored during session;Repositioned(LUE elevated due to edema )     Hand Dominance Right   Extremity/Trunk Assessment Upper Extremity Assessment Upper Extremity Assessment: Generalized weakness;LUE deficits/detail LUE Deficits / Details: notable edema in LUE and bruising noted to wrist/hand , did not formally asess as RN reports MD still considering imaging vs doppler  LUE Coordination: decreased fine motor;decreased gross motor   Lower Extremity Assessment Lower Extremity Assessment: Defer to PT evaluation   Cervical / Trunk Assessment Cervical / Trunk Assessment: Kyphotic   Communication Communication Communication: HOH   Cognition Arousal/Alertness: Awake/alert Behavior During Therapy: WFL for tasks assessed/performed Overall Cognitive Status: No family/caregiver present to determine baseline cognitive functioning                                 General Comments: pt often with tangential speech and easily distracted, spoke with RN who reports daughter stating this is close to her baseline    General Comments  VSS     Exercises     Shoulder Instructions      Home Living Family/patient expects to be discharged to:: Unsure Living Arrangements: Spouse/significant other Available Help at Discharge: Family;Available 24 hours/day Type of Home: House Home Access: Stairs to enter [employer] of Steps: 1 onto porch then threshold Entrance Stairs-Rails: None Home Layout: One level     Bathroom Shower/Tub: Chief Strategy Officer: Standard     Home Equipment: Environmental consultant - 2 wheels;Shower seat          Prior Functioning/Environment Level of Independence: Independent with assistive device(s)        Comments: Pt reports could ambulate short community distances with RW and perform ADLs.  Mild confusion at times. pt reports she has an aide 1x/wk to assist with bathing and some  iADL tasks         OT Problem List: Decreased strength;Decreased range of motion;Decreased activity tolerance;Impaired balance (sitting and/or standing);Decreased cognition;Decreased safety awareness;Impaired UE functional use;Pain;Decreased knowledge of precautions      OT Treatment/Interventions: Self-care/ADL training;Therapeutic exercise;Energy conservation;DME and/or AE instruction;Therapeutic activities;Visual/perceptual remediation/compensation;Patient/family education;Balance training    OT Goals(Current goals can be found in the care plan section) Acute Rehab OT Goals Patient Stated Goal: return home OT Goal Formulation: With patient Time For Goal Achievement: 05/16/20 Potential to Achieve Goals: Good  OT Frequency: Min 2X/week   Barriers to D/C:            Co-evaluation              AM-PAC OT "6 Clicks" Daily Activity     Outcome Measure Help from another person eating meals?: A Little Help from another person taking care of personal grooming?: A Little Help from another person toileting, which includes using toliet, bedpan, or urinal?: A Little Help from another person bathing (including washing, rinsing, drying)?: A Lot Help from another person to put on and taking off regular upper body clothing?: A Little Help from another person to put on and taking off regular lower body clothing?: A Lot 6 Click Score: 16   End of Session Equipment Utilized During Treatment: Rolling  walker Nurse Communication: Mobility status(RN okay to see )  Activity Tolerance: Patient tolerated treatment well Patient left: in chair;with call bell/phone within reach;with chair alarm set  OT Visit Diagnosis: Muscle weakness (generalized) (M62.81);Other symptoms and signs involving cognitive function;Unsteadiness on feet (R26.81)                Time: 7681-1572 OT Time Calculation (min): 22 min Charges:  OT General Charges $OT Visit: 1 Visit OT Evaluation $OT Eval Moderate  Complexity: 1 Mod  Lou Cal, OT Acute Rehabilitation Services Pager [phone] Office [phone]  Raymondo Band 05/02/2020, 3:53 PM

## 2020-05-02 NOTE — Progress Notes (Signed)
No BM's.  States she feels well.  VSS.  Alert, coherent, NAD.  Skin warm, radial pulse is full.  Patient's transfusions ended 2 days ago; current hgb 7.5    The patient has had a mild decline in hemoglobin over past 24 hours, but colonoscopy yesterday showed no evidence of gross blood or fresh blood in the colon, just small amounts of old blood (from review of photographs), making it likely that the slight decline in hemoglobin is due to equilibration.    The patient has had a previous AAA repair but enteroscopy showed no evidence of ulceration or other abnormality in the third portion of the duodenum.  BUN and creatinine have normalized since admission; there was some degree of disproportionate elevation of BUN to creatinine on the day of admission but I do not think that this was compelling for an upper tract source.  Meanwhile, the patient was noted to have multiple diverticula during her colonoscopy.  Impression:   1. Overall, this seems most compatible with a diverticular bleed, which is currently quiescent.  2.  Posthemorrhagic anemia, stable.  Plan: Continue clinical monitoring, daily CBC, consider small bowel capsule endoscopy if there is evidence of further bleeding.  Will advance diet.   Jennifer [name], M.D. Pager [phone] If no answer or after 5 PM call [phone]

## 2020-05-02 NOTE — Progress Notes (Signed)
PROGRESS NOTE    Jennifer [name]  ZOX:[id number] DOB: 1925/05/04 DOA: 04/29/2020 PCP: Lorenda Ishihara, MD    Chief Complaint  Patient presents with  . GI Bleeding  . 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 Laneta Simmers 09/28/2014-repair of thoracic endoleak by Dr. Lowella Dandy 06/06/2016 Severe degenerative disc disease  Admit 04/29/2020 rectal bleed since ~04/16/2020-reported 100 amount BRB commode-+ chronic hemorrhoids-takes Naprosyn Hemoglobin 7.1-vasovagal in ED blood pressure 60-EKG ST depressions laterally [facility] consulted-TX 2U PRBC Interventional radiology consulted in addition   Assessment & Plan:   Active Problems:   CKD (chronic kidney disease), stage III   Diabetes (HCC)   CHF (congestive heart failure) (HCC)   HTN (hypertension)   Hyperlipidemia   Acute blood loss anemia   Lower GI bleed   Diastolic CHF (HCC)   Chest pain   Abnormal ECG   Hyperkalemia   Prolonged QT interval   Acute GI bleed Status post angiography + push enteroscopy as below No further bleeding overnight reported per nursing Feel if no bleeding can likely transfer once seen by GI to telemetry floor-is being graduated in terms of diet to more substantial diet per Dr. Matthias Hughs 5 6/1  Acute blood loss anemia-iron studies 5/29 within normal limits Transfusion threshold is less than 7 She does not have any underlying coronary disease Repeat labs in a.m. and suspect of proportion of prior anemia is likely secondary to dilution and chronic venous draws being in the hospital  AKI on admission + hyperkalemia + hyperchloremia Hyperchloremic to some degree but hyperkalemia from previously is resolved We will repeat labs in a.m.  Elevated  troponin on admission to 252-->366 She is not complaining of any chest pain at this time my over read of the EKG from prior to this admission on 06/22/2015 shows similar changes across lateral leads which do not indicate any significant ischemia From my perspective she is not having chest pain and I would not further follow this  DM TY 2 nephropathy neuropathy  HFpEF Echo this admit 55-60% G DD 1 No wall motion abnormality found on echo-no further work-up at this time  AAA + repair 2015/2017 endoleak repair Outpatient follow-up with regards to vascular/CVTS follow-up  Thrombocytopenia not otherwise specified Patient is not on any blood thinners because of GI bleed-we will recheck labs in the morning-my suspicion is that this is more likely secondary to a drop in total blood cell mass and this should rebound-if it does not we will see how things go    DVT prophylaxis:  Code Status:  Family Communication: Disposition:   Status is: Inpatient  Remains inpatient appropriate because:Unsafe d/c plan   Dispo: The patient is from: Home              Anticipated d/c is to: SNF              Anticipated d/c date is: 1 day              Patient currently is not medically stable to d/c.  Therapy feels like she can discharge with home health PT likely in the next day or so depending on recurrence of bleeding and hemoglobin-she may need a transfusion in a.m. if she is below 7    Consultants:  Dr. Peggye Ley gastroenterology  Procedures: Push enteroscopy 5/31 = poor prep, scattered old blood-diverticulosis + large internal/external hemorrhoids no evidence of bleeding  SMA arteriography 5/30 = no active bleed vascular malformation-IMA occluded large arc of Riolan supplying IMA territory via SMA  Antimicrobials: None at this time   Subjective: Awake alert She really wants to go home When I try to sit her up at the bedside however she is more than 2 assist and is hardly able to get her  feet on the ground She has no chest pain no fever no chills no Reiger and there is no been no reports of blood loss overnight rectally rectally She is tolerating full liquid diet according to nursing  Objective: Vitals:   05/02/20 0200 05/02/20 0300 05/02/20 0400 05/02/20 0500  BP: (!) 151/58 (!) 160/48 (!) 155/59 (!) 165/63  Pulse: 79 82 78 79  Resp: 20 (!) Temp:   98.3 F (36.8 C)   TempSrc:   Axillary   SpO2: 92% 92% (!) 88% 93%  Weight:      Height:        Intake/Output Summary (Last 24 hours) at 05/02/2020 0655 Last data filed at 05/01/2020 1854 Gross per 24 hour  Intake 772.87 ml  Output 250 ml  Net 522.87 ml   Filed Weights   04/30/20 0143  Weight: 63.8 kg    Examination:  General exam: Pleasant white female looking about stated age slightly younger arcus senilis is present, no icterus no pallor No thyromegaly No lymphadenopathy Respiratory system: Clinically clear no added sound no rales no rhonchi Cardiovascular system: S1-S2 no murmur Gastrointestinal system: Soft nontender she does have a midline scar with abdominal hernia in addition. Central nervous system: Intact with no focal deficit coherent Extremities: Soft no swelling Skin: No rash Psychiatry: Euthymic and pleasant    Data Reviewed: I have personally reviewed following labs and imaging studies BUN/creatinine on admission 47/1.5-->19/1.09 Chloride 117 Bicarb 21 Hemoglobin 7.5 down from 8.45/30 Platelets 105-baseline previously seems to be in the 250 range  Radiology Studies: NM GI Blood Loss  Result Date: 04/30/2020 CLINICAL DATA:  85 year old female with GI bleed. EXAM: NUCLEAR MEDICINE GASTROINTESTINAL BLEEDING SCAN TECHNIQUE: Sequential abdominal images were obtained following intravenous administration of Tc-31m labeled red blood cells. RADIOPHARMACEUTICALS:  20.1 mCi Tc-57m pertechnetate in-vitro labeled red cells. COMPARISON:  05/08/2015 CT and 04/26/2015 GI bleeding study.  FINDINGS: The patient could only tolerate 1-1/2 hours of scanning. Abnormal activity is initially identified within the mid-upper RIGHT abdomen and extends to the LEFT side. Given appearance and location of bowel loops on prior CT, favor GI bleed originating from proximal small bowel although it is difficult to entirely exclude active bleeding originating within the colon at the hepatic flexure. IMPRESSION: Active GI bleeding.  Favor small bowel bleed as discussed above. Electronically Signed   By: Harmon Pier M.D.   On: 04/30/2020 14:15   IR Angiogram Visceral Selective  Result Date: 04/30/2020 INDICATION: Acute GI bleed requiring blood transfusion. Positive nuclear medicine bleeding scan in the region of small bowel and or proximal colon. History of prior abdominal aortic aneurysm resection with placement of aortic tube graft and prior TEVAR. EXAM: 1. ULTRASOUND GUIDANCE FOR VASCULAR ACCESS OF THE RIGHT COMMON FEMORAL ARTERY 2. SELECTIVE ARTERIOGRAPHY OF THE SUPERIOR MESENTERIC ARTERY MEDICATIONS: 100 mcg intra-arterial nitroglycerin administered during the procedure. ANESTHESIA/SEDATION: Moderate (conscious) sedation was employed during this procedure. A total of Versed 2.5 mg and Fentanyl 100 mcg was administered intravenously.  Moderate Sedation Time: 38 minutes. The patient's level of consciousness and vital signs were monitored continuously by radiology nursing throughout the procedure under my direct supervision. CONTRAST:  99 mL Visipaque 320 FLUOROSCOPY TIME:  Fluoroscopy Time: 54 seconds.  368 mGy. COMPLICATIONS: None immediate. PROCEDURE: Informed consent was obtained from the patient following explanation of the procedure, risks, benefits and alternatives. The patient understands, agrees and consents for the procedure. All questions were addressed. A time out was performed prior to the initiation of the procedure. Maximal barrier sterile technique utilized including caps, mask, sterile gowns, sterile  gloves, large sterile drape, hand hygiene, and chlorhexidine prep. Ultrasound was used to confirm patency of the right common femoral artery. Under direct ultrasound guidance, access of the artery was performed with a 21 gauge needle and micropuncture set. After establishing arterial access, a 5 French sheath was placed over a guidewire. A 5 French Cobra catheter was advanced into the abdominal aorta. This was used to selectively catheterize the superior mesenteric artery. The catheter was further advanced into the trunk of the SMA over a guidewire. Selective arteriography of the SMA was then performed in multiple projections prior to, and following, administration of intra-arterial nitroglycerin. Additional arteriography was performed through the catheter at the level of the distal aorta. The catheter was removed. Hemostasis was obtained after sheath removal utilizing the Cordis ExoSeal device and manual compression. FINDINGS: Selective superior mesenteric arteriography demonstrates normal patency of supply to the small bowel and colon without evidence of active bleeding, arteriovenous malformation, pseudoaneurysm or other vascular abnormality. There is incidental replacement of the right hepatic artery off of the proximal SMA. A prominent Arc of Riolan is present extending into the IMA territory and supplying the descending colon and rectum. Additional injection at the level of the distal aorta confirms ligation and sacrifice of the inferior mesenteric artery at the time of abdominal aortic aneurysm repair. The distal segment of the aortic tube graft shows normal patency. IMPRESSION: Selective superior mesenteric arteriography does not demonstrate evidence of active bleeding source, AV malformation or other vascular abnormality. Embolization was not able to be performed. The SMA also supplies IMA territory via an Arc of Riolan after prior IMA ligation. Electronically Signed   By: Irish Lack M.D.   On:  04/30/2020 16:56   IR US Guide Vasc Access Right  Result Date: 04/30/2020 INDICATION: Acute GI bleed requiring blood transfusion. Positive nuclear medicine bleeding scan in the region of small bowel and or proximal colon. History of prior abdominal aortic aneurysm resection with placement of aortic tube graft and prior TEVAR. EXAM: 1. ULTRASOUND GUIDANCE FOR VASCULAR ACCESS OF THE RIGHT COMMON FEMORAL ARTERY 2. SELECTIVE ARTERIOGRAPHY OF THE SUPERIOR MESENTERIC ARTERY MEDICATIONS: 100 mcg intra-arterial nitroglycerin administered during the procedure. ANESTHESIA/SEDATION: Moderate (conscious) sedation was employed during this procedure. A total of Versed 2.5 mg and Fentanyl 100 mcg was administered intravenously. Moderate Sedation Time: 38 minutes. The patient's level of consciousness and vital signs were monitored continuously by radiology nursing throughout the procedure under my direct supervision. CONTRAST:  99 mL Visipaque 320 FLUOROSCOPY TIME:  Fluoroscopy Time: 54 seconds.  368 mGy. COMPLICATIONS: None immediate. PROCEDURE: Informed consent was obtained from the patient following explanation of the procedure, risks, benefits and alternatives. The patient understands, agrees and consents for the procedure. All questions were addressed. A time out was performed prior to the initiation of the procedure. Maximal barrier sterile technique utilized including caps, mask, sterile gowns, sterile gloves, large sterile drape, hand hygiene, and chlorhexidine prep.  Ultrasound was used to confirm patency of the right common femoral artery. Under direct ultrasound guidance, access of the artery was performed with a 21 gauge needle and micropuncture set. After establishing arterial access, a 5 French sheath was placed over a guidewire. A 5 French Cobra catheter was advanced into the abdominal aorta. This was used to selectively catheterize the superior mesenteric artery. The catheter was further advanced into the trunk  of the SMA over a guidewire. Selective arteriography of the SMA was then performed in multiple projections prior to, and following, administration of intra-arterial nitroglycerin. Additional arteriography was performed through the catheter at the level of the distal aorta. The catheter was removed. Hemostasis was obtained after sheath removal utilizing the Cordis ExoSeal device and manual compression. FINDINGS: Selective superior mesenteric arteriography demonstrates normal patency of supply to the small bowel and colon without evidence of active bleeding, arteriovenous malformation, pseudoaneurysm or other vascular abnormality. There is incidental replacement of the right hepatic artery off of the proximal SMA. A prominent Arc of Riolan is present extending into the IMA territory and supplying the descending colon and rectum. Additional injection at the level of the distal aorta confirms ligation and sacrifice of the inferior mesenteric artery at the time of abdominal aortic aneurysm repair. The distal segment of the aortic tube graft shows normal patency. IMPRESSION: Selective superior mesenteric arteriography does not demonstrate evidence of active bleeding source, AV malformation or other vascular abnormality. Embolization was not able to be performed. The SMA also supplies IMA territory via an Arc of Riolan after prior IMA ligation. Electronically Signed   By: Aletta Edouard M.D.   On: 04/30/2020 16:56   ECHOCARDIOGRAM COMPLETE  Result Date: 04/30/2020    ECHOCARDIOGRAM REPORT   Patient Name:   Jennifer [name] Date of Exam: 04/30/2020 Medical Rec #:  [id number]     Height:       63.0 in Accession #:    [id number]    Weight:       140.7 lb Date of Birth:  09-20-1925     BSA:          1.665 m Patient Age:    40 years      BP:           119/55 mmHg Patient Gender: F             HR:           88 bpm. Exam Location:  Inpatient Procedure: 2D Echo Indications:    Chest Pain 786.50 / R07.9  History:        Patient has  prior history of Echocardiogram examinations, most                 recent 04/01/2015. Risk Factors:Diabetes, Hypertension and                 Dyslipidemia. CKD                 History of aortic aneurysm.  Sonographer:    Vikki Ports Turrentine Referring Phys: [place]  1. Left ventricular ejection fraction, by estimation, is 55 to 60%. The left ventricle has normal function. The left ventricle has no regional wall motion abnormalities. There is mild left ventricular hypertrophy. Left ventricular diastolic parameters are consistent with Grade I diastolic dysfunction (impaired relaxation).  2. Right ventricular systolic function is normal. The right ventricular size is normal. Tricuspid regurgitation signal is inadequate for assessing PA pressure.  3. The mitral valve  is degenerative, mildly thickened and with moderate annular calcification. Mild mitral valve regurgitation.  4. The aortic valve is tricuspid. Aortic valve regurgitation is moderate. Mild to moderate aortic valve sclerosis/calcification is present, without any evidence of aortic stenosis.  5. The inferior vena cava is normal in size with greater than 50% respiratory variability, suggesting right atrial pressure of 3 mmHg.  6. Distal aortic arch and descending aorta not well seen - history of stent graft repair of aneurysm. FINDINGS  Left Ventricle: Left ventricular ejection fraction, by estimation, is 55 to 60%. The left ventricle has normal function. The left ventricle has no regional wall motion abnormalities. The left ventricular internal cavity size was small. There is mild left ventricular hypertrophy. Left ventricular diastolic parameters are consistent with Grade I diastolic dysfunction (impaired relaxation). Right Ventricle: The right ventricular size is normal. No increase in right ventricular wall thickness. Right ventricular systolic function is normal. Tricuspid regurgitation signal is inadequate for assessing PA  pressure. Left Atrium: Left atrial size was normal in size. Right Atrium: Right atrial size was normal in size. Pericardium: There is no evidence of pericardial effusion. Presence of pericardial fat pad. Mitral Valve: The mitral valve is degenerative in appearance. There is mild calcification of the mitral valve leaflet(s). Moderate mitral annular calcification. Mild mitral valve regurgitation. Tricuspid Valve: The tricuspid valve is grossly normal. Tricuspid valve regurgitation is mild. Aortic Valve: The aortic valve is tricuspid. Aortic valve regurgitation is moderate. Mild to moderate aortic valve sclerosis/calcification is present, without any evidence of aortic stenosis. Moderate aortic valve annular calcification. Pulmonic Valve: The pulmonic valve was grossly normal. Pulmonic valve regurgitation is trivial. Aorta: The aortic root is normal in size and structure. Venous: The inferior vena cava is normal in size with greater than 50% respiratory variability, suggesting right atrial pressure of 3 mmHg. IAS/Shunts: The interatrial septum appears to be lipomatous. No atrial level shunt detected by color flow Doppler.  LEFT VENTRICLE PLAX 2D LVIDd:         3.60 cm  Diastology LVIDs:         2.80 cm  LV e' lateral:   5.33 cm/s LV PW:         1.00 cm  LV E/e' lateral: 15.2 LV IVS:        1.00 cm  LV e' medial:    4.90 cm/s LVOT diam:     2.00 cm  LV E/e' medial:  16.6 LV SV:         43 LV SV Index:   26 LVOT Area:     3.14 cm  RIGHT VENTRICLE RV S prime:     10.10 cm/s LEFT ATRIUM           Index       RIGHT ATRIUM           Index LA Vol (A4C): 28.0 ml 16.82 ml/m RA Area:     11.80 cm                                   RA Volume:   25.00 ml  15.02 ml/m  AORTIC VALVE LVOT Vmax:   100.00 cm/s LVOT Vmean:  66.500 cm/s LVOT VTI:    0.138 m  AORTA Ao Root diam: 3.20 cm MITRAL VALVE MV Area (PHT): 4.53 cm     SHUNTS MV Decel Time: 168 msec     Systemic VTI:  0.14 m  MV E velocity: 81.10 cm/s   Systemic Diam: 2.00 cm MV  A velocity: 100.00 cm/s MV E/A ratio:  0.81 Nona DellSamuel Mcdowell MD Electronically signed by Nona DellSamuel Mcdowell MD Signature Date/Time: 04/30/2020/10:53:52 AM    Final       Scheduled Meds: . sodium chloride   Intravenous Once  . sodium chloride   Intravenous Once  . atorvastatin  20 mg Oral Daily  . Chlorhexidine Gluconate Cloth  6 each Topical Daily  . furosemide  20 mg Oral BID  . hydrocortisone  25 mg Rectal BID  . insulin aspart  0-9 Units Subcutaneous Q4H  . labetalol  100 mg Oral BID  . lidocaine   Topical Q8H  . mouth rinse  15 mL Mouth Rinse BID  . pantoprazole (PROTONIX) IV  40 mg Intravenous Q12H  . sodium chloride flush  3 mL Intravenous Q12H   Continuous Infusions:   LOS: 2 days    Time spent: 6345    Rhetta MuraJai-Gurmukh Romy Mcgue, MD [facility]   To contact the attending provider between 7A-7P or the covering provider during after hours 7P-7A, please log into the web site [email or web address] and access using universal [place] password for that web site. If you do not have the password, please call the hospital operator.  05/02/2020, 6:55 AM

## 2020-05-03 ENCOUNTER — Other Ambulatory Visit: Payer: Self-pay

## 2020-05-03 DIAGNOSIS — I208 Other forms of angina pectoris: Secondary | ICD-10-CM

## 2020-05-03 DIAGNOSIS — I5030 Unspecified diastolic (congestive) heart failure: Secondary | ICD-10-CM

## 2020-05-03 LAB — CBC
HCT: 23.9 % — ABNORMAL LOW (ref 36.0–46.0)
Hemoglobin: 7.4 g/dL — ABNORMAL LOW (ref 12.0–15.0)
MCH: 29.6 pg (ref 26.0–34.0)
MCHC: 31 g/dL (ref 30.0–36.0)
MCV: 95.6 fL (ref 80.0–100.0)
Platelets: 122 10*3/uL — ABNORMAL LOW (ref 150–400)
RBC: 2.5 MIL/uL — ABNORMAL LOW (ref 3.87–5.11)
RDW: 17 % — ABNORMAL HIGH (ref 11.5–15.5)
WBC: 7.6 10*3/uL (ref 4.0–10.5)
nRBC: 0 % (ref 0.0–0.2)

## 2020-05-03 LAB — HEMOGLOBIN AND HEMATOCRIT, BLOOD
HCT: 27 % — ABNORMAL LOW (ref 36.0–46.0)
Hemoglobin: 8.6 g/dL — ABNORMAL LOW (ref 12.0–15.0)

## 2020-05-03 LAB — PREPARE RBC (CROSSMATCH)

## 2020-05-03 LAB — SURGICAL PATHOLOGY

## 2020-05-03 MED ORDER — SODIUM CHLORIDE 0.9% IV SOLUTION: Freq: Once | INTRAVENOUS | Status: DC

## 2020-05-03 NOTE — Progress Notes (Addendum)
Physical Therapy Treatment Patient Details Name: Jennifer [name] MRN: [id number] DOB: 1925-08-17 Today's Date: 05/03/2020    History of Present Illness Pt is a 86 yo female with PMH including DM2, HTN, anemia, CHF, HLD, osteoporosis, depression, AAA and repair, GERD, and DDD.  Pt presented with GIB and is s/p EGD and colonscopy with findings consistent with diverticular bleed. Current Hgb is 7.5    PT Comments    Pt participated well. Continues to report some pain in L wrist/hand.    Follow Up Recommendations  Home health PT;Supervision/Assistance - 24 hour     Equipment Recommendations  None recommended by PT    Recommendations for Other Services       Precautions / Restrictions Precautions Precautions: Fall Precaution Comments: HOH Restrictions Weight Bearing Restrictions: No    Mobility  Bed Mobility Overal bed mobility: Needs Assistance Bed Mobility: Sit to Supine      Sit to supine: Min guard;HOB elevated   General bed mobility comments: Increased time and effort. Cues required.  Transfers Overall transfer level: Needs assistance Equipment used: Rolling walker (2 wheeled) Transfers: Sit to/from Stand Sit to Stand: Min assist         General transfer comment: Assist to power up. VCs safety, hand placement. x2 (once from recliner, once from toilet)  Ambulation/Gait Ambulation/Gait assistance: Min guard Gait Distance (Feet): 115 Feet Assistive device: Rolling walker (2 wheeled) Gait Pattern/deviations: Step-through pattern;Decreased stride length;Trunk flexed     General Gait Details: Min guard for safety. Cues for posture, RW proximity   Stairs             Wheelchair Mobility    Modified Rankin (Stroke Patients Only)       Balance Overall balance assessment: Needs assistance Sitting-balance support: No upper extremity supported;Feet supported Sitting balance-Leahy Scale: Good     Standing balance support: Bilateral upper extremity  supported;During functional activity;Single extremity supported Standing balance-Leahy Scale: Fair Standing balance comment: patient able to maintain balance with unilateral support, increased safety with B UE support                            Cognition Arousal/Alertness: Awake/alert Behavior During Therapy: WFL for tasks assessed/performed Overall Cognitive Status: No family/caregiver present to determine baseline cognitive functioning                                 General Comments: pt often with tangential speech and easily distracted       Exercises      General Comments        Pertinent Vitals/Pain Pain Assessment: Faces Faces Pain Scale: Hurts little more Pain Location: L wrist Pain Descriptors / Indicators: Discomfort;Sore Pain Intervention(s): Monitored during session    Home Living Family/patient expects to be discharged to:: Private residence                    Prior Function            PT Goals (current goals can now be found in the care plan section) Acute Rehab PT Goals Patient Stated Goal: return home Progress towards PT goals: Progressing toward goals    Frequency    Min 3X/week      PT Plan Current plan remains appropriate    Co-evaluation              AM-PAC PT "6 Clicks"  Mobility   Outcome Measure  Help needed turning from your back to your side while in a flat bed without using bedrails?: A Little Help needed moving from lying on your back to sitting on the side of a flat bed without using bedrails?: A Little Help needed moving to and from a bed to a chair (including a wheelchair)?: A Little Help needed standing up from a chair using your arms (e.g., wheelchair or bedside chair)?: A Little Help needed to walk in hospital room?: A Little Help needed climbing 3-5 steps with a railing? : A Little 6 Click Score: 18    End of Session Equipment Utilized During Treatment: Gait belt Activity  Tolerance: Patient tolerated treatment well Patient left: in bed;with call bell/phone within reach;with bed alarm set   PT Visit Diagnosis: Muscle weakness (generalized) (M62.81);Unsteadiness on feet (R26.81)     Time: 6578-4696 PT Time Calculation (min) (ACUTE ONLY): 34 min  Charges:  $Gait Training: 8-22 mins $Therapeutic Activity: 8-22 mins                         Faye Ramsay, PT Acute Rehabilitation

## 2020-05-03 NOTE — Progress Notes (Signed)
Occupational Therapy Treatment Patient Details Name: Jennifer [name] MRN: [id number] DOB: Apr 04, 1925 Today's Date: 05/03/2020    History of present illness Pt is a 86 yo female with PMH including DM2, HTN, anemia, CHF, HLD, osteoporosis, depression, AAA and repair, GERD, and DDD.  Pt presented with GIB and is s/p EGD and colonscopy with findings consistent with diverticular bleed. Current Hgb is 7.5   OT comments  Patient progressing towards goals. Patient primarily supervision level for functional transfers and UB ADLs with mod cues for safety with body mechanics using rolling walker. Patient has tendency to let go of walker and hold onto counter, chair, before safely aligned with surface. Will continue to follow.    Follow Up Recommendations  Home health OT;Supervision/Assistance - 24 hour    Equipment Recommendations  None recommended by OT       Precautions / Restrictions Precautions Precautions: Fall Precaution Comments: HOH Restrictions Weight Bearing Restrictions: No       Mobility Bed Mobility Overal bed mobility: Needs Assistance Bed Mobility: Supine to Sit     Supine to sit: Min assist     General bed mobility comments: trunk elevation  Transfers Overall transfer level: Needs assistance Equipment used: Rolling walker (2 wheeled) Transfers: Sit to/from Stand Sit to Stand: Supervision;Min guard         General transfer comment: patient requires mod cues to keep hands on walker throughout transfer until fully aligned with chair, toilet.     Balance Overall balance assessment: Needs assistance Sitting-balance support: No upper extremity supported;Feet supported Sitting balance-Leahy Scale: Good     Standing balance support: Bilateral upper extremity supported;During functional activity;Single extremity supported Standing balance-Leahy Scale: Fair Standing balance comment: patient able to maintain balance with unilateral support, increased safety with B UE  support                           ADL either performed or assessed with clinical judgement   ADL Overall ADL's : Needs assistance/impaired     Grooming: Oral care;Wash/dry face;Supervision/safety;Standing           Upper Body Dressing : Set up;Sitting Upper Body Dressing Details (indicate cue type and reason): to don clean gown     Toilet Transfer: Supervision/safety;Min guard;Cueing for safety;Regular Toilet;RW;Grab bars;Ambulation Toilet Transfer Details (indicate cue type and reason): patient requires cues to maintain hand placement on walker until aligned with toilet Toileting- Clothing Manipulation and Hygiene: Supervision/safety;Sitting/lateral lean;Sit to/from stand       Functional mobility during ADLs: Supervision/safety;Min guard;Cueing for safety;Rolling walker                 Cognition Arousal/Alertness: Awake/alert Behavior During Therapy: WFL for tasks assessed/performed Overall Cognitive Status: No family/caregiver present to determine baseline cognitive functioning                                 General Comments: pt often with tangential speech and easily distracted                    Pertinent Vitals/ Pain       Pain Assessment: Faces Faces Pain Scale: Hurts a little bit Pain Location: L wrist Pain Descriptors / Indicators: Discomfort Pain Intervention(s): Other (comment)(RN aware)         Frequency  Min 2X/week        Progress Toward Goals  OT Goals(current goals  can now be found in the care plan section)  Progress towards OT goals: Progressing toward goals  Acute Rehab OT Goals Patient Stated Goal: return home OT Goal Formulation: With patient Time For Goal Achievement: 05/16/20 Potential to Achieve Goals: Good ADL Goals Pt Will Perform Grooming: with supervision;sitting;standing Pt Will Perform Lower Body Bathing: with supervision;sitting/lateral leans;sit to/from stand Pt Will Perform Lower Body  Dressing: with supervision;sit to/from stand;sitting/lateral leans Pt Will Transfer to Toilet: with supervision;ambulating Pt Will Perform Toileting - Clothing Manipulation and hygiene: with supervision;sit to/from stand;sitting/lateral leans Pt/caregiver will Perform Home Exercise Program: Increased strength;Increased ROM;Left upper extremity;With written HEP provided;With Supervision  Plan Discharge plan remains appropriate       AM-PAC OT "6 Clicks" Daily Activity     Outcome Measure   Help from another person eating meals?: A Little Help from another person taking care of personal grooming?: A Little Help from another person toileting, which includes using toliet, bedpan, or urinal?: A Little Help from another person bathing (including washing, rinsing, drying)?: A Lot Help from another person to put on and taking off regular upper body clothing?: A Little Help from another person to put on and taking off regular lower body clothing?: A Lot 6 Click Score: 16    End of Session Equipment Utilized During Treatment: Rolling walker  OT Visit Diagnosis: Muscle weakness (generalized) (M62.81);Other symptoms and signs involving cognitive function;Unsteadiness on feet (R26.81)   Activity Tolerance Patient tolerated treatment well   Patient Left in chair;with call bell/phone within reach;with chair alarm set   Nurse Communication Mobility status        Time: 1443-1540 OT Time Calculation (min): 25 min  Charges: OT General Charges $OT Visit: 1 Visit OT Treatments $Self Care/Home Management : 23-37 mins  Jennifer [name] OT Pager: [phone]   Jennifer [name] 05/03/2020, 2:51 PM

## 2020-05-03 NOTE — Care Management Important Message (Signed)
Important Message  Patient Details  Name: Jennifer [name] MRN: [id number] Date of Birth: [birth year]   Medicare Important Message Given:    Given to Lanier Clam, NCM  for the patient to sign     Chari Manning 05/03/2020, 10:28 AM

## 2020-05-03 NOTE — Progress Notes (Signed)
Hemoglobin stable at 7.4.  No evidence of further bleeding. Specifically, no BM's.  Recommendations:  1. Do not think small bowel capsule endoscopy is necessary.  2.  I think discharge home tomorrow would be reasonable if the patient remains clinically stable.  However, since this was probably a diverticular bleed and they are notorious for restarting after a couple of days of quiescence, it would not be unreasonable to hold the patient in-house (taking into account her advanced age) until the day after tomorrow for added assurance of the absence of recurrent bleeding.  3.  Follow-up with GI is not necessary (Dr. Dulce Sellar is her primary gastroenterologist).  However, following discharge, the patient should have periodic hemoglobin determination and Hemoccult testing (can be done through her PCP) to confirm resolution of the bleeding (if she remained heme positive, a small bowel capsule endoscopy might have to be reconsidered)  4. We will sign off, but please call if we can be of further assistance in this patient's care.  Florencia Reasons, M.D. Pager [phone] If no answer or after 5 PM call [phone]

## 2020-05-03 NOTE — Progress Notes (Signed)
[facility]                                                                              Patient Demographics  Jennifer [name], is a 86 y.o. female, DOB - [birth year], WJX:[id number]  Admit date - 04/29/2020   Admitting Physician Jennifer Doyne, MD  Outpatient Primary MD for the patient is Jennifer Ishihara, MD  Outpatient specialists:   LOS - 3  days   Medical records reviewed and are as summarized below:    Chief Complaint  Patient presents with  . GI Bleeding  . Back Pain       Brief summary   Patient is a 86 year old female with history of CKD stage III, diabetes mellitus, chronic diastolic CHF, chronic iron deficiency anemia presented with rectal bleeding for 4 days PTA.  Patient had prior history of hemorrhoids and chronic blood loss.  No abdominal pain, no shortness of breath, nausea or vomiting.  Patient was taking naproxen. Had bleeding scan with area of bleeding noted, status post IR angiography. GI was consulted.  Assessment & Plan    Acute lower GI bleed, acute blood loss anemia on chronic iron deficiency anemia -Suspected lower GI source, no prior history of PUD, likely diverticular bleed -GI was consulted, hemoglobin 7.1 on admission, patient received 4 units packed RBCs -Colonoscopy on 5/21 showed no evidence of gross blood or fresh blood in the colon, just small amounts of old blood -Previous AAA repair but enteroscopy showed no evidence of ulceration or other abnormality in the third portion of duodenum -Hemoglobin slightly low 7.4 today ( <-7.5 on 6/1).  No active bleeding overnight. -Discussed with GI, Dr. Matthias [name], recommended 1 unit packed RBC transfusion, if tolerating diet can be discharged today or tomorrow after the transfusion  Acute on chronic kidney disease stage IIIa -Creatinine 1.56, BUN 47 at the time of admission 1.2-1.3 -Avoid NSAIDs -Patient received packed RBC transfusion, creatinine 1.09 on 5/31   Essential  hypertension  Initially antihypertensives held due to soft BP, now has been resumed  Hyperlipidemia Stable  Chronic diastolic CHF -Currently stable, euvolemic, follow closely with packed RBC transfusion  Atypical chest pain -At the time of admission, possibly due to demand ischemia, EKG showed some ischemic changes.  Dr. Shawnie [name] reviewed with cardiology, no good options given bleeding, elevated troponin likely due to demand ischemia.  Could add beta-blocker if tolerates. -2D echo showed EF of 55 to 60% with grade 1 diastolic dysfunction, mitral valve degeneration with thickened valve and annular calcification with mild MR, moderate AR  Hyperkalemia Resolved  Prolonged QT -Avoid QT prolonging medications  Diabetes mellitus type 2 Continue sliding scale insulin   Code Status: DNR DVT Prophylaxis: SCDs Family Communication: Discussed all imaging results, lab results, explained to the patient   Disposition Plan:     Status is: Inpatient  Remains inpatient appropriate because:Inpatient level of care appropriate due to severity of illness   Dispo: The patient is from: Home              Anticipated d/c is to: Home              Anticipated  d/c date is: 1 day              Patient currently is not medically stable to d/c.  Hemoglobin trending down today to 7.4, will transfuse 1 unit packed RBCs.   Time Spent in minutes 25 minutes  Procedures:  Push enteroscopy 5/31 = poor prep, scattered old blood-diverticulosis + large internal/external hemorrhoids no evidence of bleeding  SMA arteriography 5/30 = no active bleed vascular malformation-IMA occluded large arc of Riolan supplying IMA territory via SMA  Left upper  extremity venous duplex: No DVT or SVT Consultants:    GI IR  Antimicrobials:   Anti-infectives (From admission, onward)   None          Medications  Scheduled Meds: . sodium chloride   Intravenous Once  . sodium chloride   Intravenous Once  .  sodium chloride   Intravenous Once  . atorvastatin  20 mg Oral Daily  . furosemide  20 mg Oral BID  . hydrocortisone  25 mg Rectal BID  . labetalol  100 mg Oral BID  . lidocaine   Topical Q8H  . mouth rinse  15 mL Mouth Rinse BID  . pantoprazole (PROTONIX) IV  40 mg Intravenous Q12H  . sodium chloride flush  3 mL Intravenous Q12H   Continuous Infusions: PRN Meds:.acetaminophen **OR** acetaminophen, phenol      Subjective:   Jennifer [name] was seen and examined today.  No acute complaints, no GI bleeding overnight.  No chest pain or acute shortness of breath.  Hoping to go home soon. Patient denies dizziness, chest pain, shortness of breath, abdominal pain, N/V/D/C, new weakness, numbess, tingling.   Objective:   Vitals:   05/03/20 0212 05/03/20 0614 05/03/20 0944 05/03/20 0958  BP: 138/67 135/60 (!) 147/63 135/60  Pulse: 81 79 83 84  Resp: Temp: 98 F (36.7 C) 99.2 F (37.3 C) 98.8 F (37.1 C)   TempSrc: Oral Oral Oral   SpO2: 94% 92% 94%   Weight:      Height:        Intake/Output Summary (Last 24 hours) at 05/03/2020 1110 Last data filed at 05/03/2020 0944 Gross per 24 hour  Intake 123 ml  Output 600 ml  Net -477 ml     Wt Readings from Last 3 Encounters:  04/30/20 63.8 kg  04/24/17 66.2 kg  01/27/17 68.9 kg     Exam  General: Alert and oriented x 3, NAD  Cardiovascular: S1 S2 auscultated, no murmurs, RRR  Respiratory: Clear to auscultation bilaterally, no wheezing, rales or rhonchi  Gastrointestinal: Soft, nontender, nondistended, + bowel sounds  Ext: no pedal edema bilaterally  Neuro: No new deficit  Musculoskeletal: No digital cyanosis, clubbing  Skin: No rashes  Psych: Pleasant, alert and oriented   Data Reviewed:  I have personally reviewed following labs and imaging studies  Micro Results Recent Results (from the past 240 hour(s))  SARS Coronavirus 2 by RT PCR (hospital order, performed in [facility] hospital lab)  Nasopharyngeal Nasopharyngeal Swab     Status: None   Collection Time: 04/30/20 12:30 AM   Specimen: Nasopharyngeal Swab  Result Value Ref Range Status   SARS Coronavirus 2 NEGATIVE NEGATIVE Final    Comment: (NOTE) SARS-CoV-2 target nucleic acids are NOT DETECTED. The SARS-CoV-2 RNA is generally detectable in upper and lower respiratory specimens during the acute phase of infection. The lowest concentration of SARS-CoV-2 viral copies this assay can detect is 250 copies / mL.  A negative result does not preclude SARS-CoV-2 infection and should not be used as the sole basis for treatment or other patient management decisions.  A negative result may occur with improper specimen collection / handling, submission of specimen other than nasopharyngeal swab, presence of viral mutation(s) within the areas targeted by this assay, and inadequate number of viral copies (<250 copies / mL). A negative result must be combined with clinical observations, patient history, and epidemiological information. Fact Sheet for Patients:   BoilerBrush.com.cy Fact Sheet for Healthcare Providers: https://pope.com/ This test is not yet approved or cleared  by the [place] FDA and has been authorized for detection and/or diagnosis of SARS-CoV-2 by FDA under an Emergency Use Authorization (EUA).  This EUA will remain in effect (meaning this test can be used) for the duration of the COVID-19 declaration under Section 564(b)(1) of the Act, 21 U.S.C. section 360bbb-3(b)(1), unless the authorization is terminated or revoked sooner. Performed at [facility], [address] [place], [place] [address]   MRSA PCR Screening     Status: None   Collection Time: 04/30/20  1:57 AM   Specimen: Nasal Mucosa; Nasopharyngeal  Result Value Ref Range Status   MRSA by PCR NEGATIVE NEGATIVE Final    Comment:        The GeneXpert MRSA Assay (FDA approved  for NASAL specimens only), is one component of a comprehensive MRSA colonization surveillance program. It is not intended to diagnose MRSA infection nor to guide or monitor treatment for MRSA infections. Performed at [facility], [address] [place], [place] [address]     Radiology Reports NM GI Blood Loss  Result Date: 04/30/2020 CLINICAL DATA:  86 year old female with GI bleed. EXAM: NUCLEAR MEDICINE GASTROINTESTINAL BLEEDING SCAN TECHNIQUE: Sequential abdominal images were obtained following intravenous administration of Tc-25m labeled red blood cells. RADIOPHARMACEUTICALS:  20.1 mCi Tc-81m pertechnetate in-vitro labeled red cells. COMPARISON:  05/08/2015 CT and 04/26/2015 GI bleeding study. FINDINGS: The patient could only tolerate 1-1/2 hours of scanning. Abnormal activity is initially identified within the mid-upper RIGHT abdomen and extends to the LEFT side. Given appearance and location of bowel loops on prior CT, favor GI bleed originating from proximal small bowel although it is difficult to entirely exclude active bleeding originating within the colon at the hepatic flexure. IMPRESSION: Active GI bleeding.  Favor small bowel bleed as discussed above. Electronically Signed   By: Harmon Pier M.D.   On: 04/30/2020 14:15   IR Angiogram Visceral Selective  Result Date: 04/30/2020 INDICATION: Acute GI bleed requiring blood transfusion. Positive nuclear medicine bleeding scan in the region of small bowel and or proximal colon. History of prior abdominal aortic aneurysm resection with placement of aortic tube graft and prior TEVAR. EXAM: 1. ULTRASOUND GUIDANCE FOR VASCULAR ACCESS OF THE RIGHT COMMON FEMORAL ARTERY 2. SELECTIVE ARTERIOGRAPHY OF THE SUPERIOR MESENTERIC ARTERY MEDICATIONS: 100 mcg intra-arterial nitroglycerin administered during the procedure. ANESTHESIA/SEDATION: Moderate (conscious) sedation was employed during this procedure. A total of Versed 2.5 mg and  Fentanyl 100 mcg was administered intravenously. Moderate Sedation Time: 38 minutes. The patient's level of consciousness and vital signs were monitored continuously by radiology nursing throughout the procedure under my direct supervision. CONTRAST:  99 mL Visipaque 320 FLUOROSCOPY TIME:  Fluoroscopy Time: 54 seconds.  368 mGy. COMPLICATIONS: None immediate. PROCEDURE: Informed consent was obtained from the patient following explanation of the procedure, risks, benefits and alternatives. The patient understands, agrees and consents for the procedure. All questions were addressed.  A time out was performed prior to the initiation of the procedure. Maximal barrier sterile technique utilized including caps, mask, sterile gowns, sterile gloves, large sterile drape, hand hygiene, and chlorhexidine prep. Ultrasound was used to confirm patency of the right common femoral artery. Under direct ultrasound guidance, access of the artery was performed with a 21 gauge needle and micropuncture set. After establishing arterial access, a 5 French sheath was placed over a guidewire. A 5 French Cobra catheter was advanced into the abdominal aorta. This was used to selectively catheterize the superior mesenteric artery. The catheter was further advanced into the trunk of the SMA over a guidewire. Selective arteriography of the SMA was then performed in multiple projections prior to, and following, administration of intra-arterial nitroglycerin. Additional arteriography was performed through the catheter at the level of the distal aorta. The catheter was removed. Hemostasis was obtained after sheath removal utilizing the Cordis ExoSeal device and manual compression. FINDINGS: Selective superior mesenteric arteriography demonstrates normal patency of supply to the small bowel and colon without evidence of active bleeding, arteriovenous malformation, pseudoaneurysm or other vascular abnormality. There is incidental replacement of the  right hepatic artery off of the proximal SMA. A prominent Arc of Riolan is present extending into the IMA territory and supplying the descending colon and rectum. Additional injection at the level of the distal aorta confirms ligation and sacrifice of the inferior mesenteric artery at the time of abdominal aortic aneurysm repair. The distal segment of the aortic tube graft shows normal patency. IMPRESSION: Selective superior mesenteric arteriography does not demonstrate evidence of active bleeding source, AV malformation or other vascular abnormality. Embolization was not able to be performed. The SMA also supplies IMA territory via an Arc of Riolan after prior IMA ligation. Electronically Signed   By: Irish Lack M.D.   On: 04/30/2020 16:56   IR US Guide Vasc Access Right  Result Date: 04/30/2020 INDICATION: Acute GI bleed requiring blood transfusion. Positive nuclear medicine bleeding scan in the region of small bowel and or proximal colon. History of prior abdominal aortic aneurysm resection with placement of aortic tube graft and prior TEVAR. EXAM: 1. ULTRASOUND GUIDANCE FOR VASCULAR ACCESS OF THE RIGHT COMMON FEMORAL ARTERY 2. SELECTIVE ARTERIOGRAPHY OF THE SUPERIOR MESENTERIC ARTERY MEDICATIONS: 100 mcg intra-arterial nitroglycerin administered during the procedure. ANESTHESIA/SEDATION: Moderate (conscious) sedation was employed during this procedure. A total of Versed 2.5 mg and Fentanyl 100 mcg was administered intravenously. Moderate Sedation Time: 38 minutes. The patient's level of consciousness and vital signs were monitored continuously by radiology nursing throughout the procedure under my direct supervision. CONTRAST:  99 mL Visipaque 320 FLUOROSCOPY TIME:  Fluoroscopy Time: 54 seconds.  368 mGy. COMPLICATIONS: None immediate. PROCEDURE: Informed consent was obtained from the patient following explanation of the procedure, risks, benefits and alternatives. The patient understands, agrees and  consents for the procedure. All questions were addressed. A time out was performed prior to the initiation of the procedure. Maximal barrier sterile technique utilized including caps, mask, sterile gowns, sterile gloves, large sterile drape, hand hygiene, and chlorhexidine prep. Ultrasound was used to confirm patency of the right common femoral artery. Under direct ultrasound guidance, access of the artery was performed with a 21 gauge needle and micropuncture set. After establishing arterial access, a 5 French sheath was placed over a guidewire. A 5 French Cobra catheter was advanced into the abdominal aorta. This was used to selectively catheterize the superior mesenteric artery. The catheter was further advanced into the trunk of the SMA  over a guidewire. Selective arteriography of the SMA was then performed in multiple projections prior to, and following, administration of intra-arterial nitroglycerin. Additional arteriography was performed through the catheter at the level of the distal aorta. The catheter was removed. Hemostasis was obtained after sheath removal utilizing the Cordis ExoSeal device and manual compression. FINDINGS: Selective superior mesenteric arteriography demonstrates normal patency of supply to the small bowel and colon without evidence of active bleeding, arteriovenous malformation, pseudoaneurysm or other vascular abnormality. There is incidental replacement of the right hepatic artery off of the proximal SMA. A prominent Arc of Riolan is present extending into the IMA territory and supplying the descending colon and rectum. Additional injection at the level of the distal aorta confirms ligation and sacrifice of the inferior mesenteric artery at the time of abdominal aortic aneurysm repair. The distal segment of the aortic tube graft shows normal patency. IMPRESSION: Selective superior mesenteric arteriography does not demonstrate evidence of active bleeding source, AV malformation or  other vascular abnormality. Embolization was not able to be performed. The SMA also supplies IMA territory via an Arc of Riolan after prior IMA ligation. Electronically Signed   By: Irish LackGlenn  Yamagata M.D.   On: 04/30/2020 16:56   ECHOCARDIOGRAM COMPLETE  Result Date: 04/30/2020    ECHOCARDIOGRAM REPORT   Patient Name:   Gershon MusselJANIE B Nicholes Date of Exam: 04/30/2020 Medical Rec #:  [id number]     Height:       63.0 in Accession #:    [id number]    Weight:       140.7 lb Date of Birth:  02/08/1925     BSA:          1.665 m Patient Age:    89 years      BP:           119/55 mmHg Patient Gender: F             HR:           88 bpm. Exam Location:  Inpatient Procedure: 2D Echo Indications:    Chest Pain 786.50 / R07.9  History:        Patient has prior history of Echocardiogram examinations, most                 recent 04/01/2015. Risk Factors:Diabetes, Hypertension and                 Dyslipidemia. CKD                 History of aortic aneurysm.  Sonographer:    Leeroy Bockhelsea Turrentine Referring Phys: [id number] ANASTASSIA DOUTOVA IMPRESSIONS  1. Left ventricular ejection fraction, by estimation, is 55 to 60%. The left ventricle has normal function. The left ventricle has no regional wall motion abnormalities. There is mild left ventricular hypertrophy. Left ventricular diastolic parameters are consistent with Grade I diastolic dysfunction (impaired relaxation).  2. Right ventricular systolic function is normal. The right ventricular size is normal. Tricuspid regurgitation signal is inadequate for assessing PA pressure.  3. The mitral valve is degenerative, mildly thickened and with moderate annular calcification. Mild mitral valve regurgitation.  4. The aortic valve is tricuspid. Aortic valve regurgitation is moderate. Mild to moderate aortic valve sclerosis/calcification is present, without any evidence of aortic stenosis.  5. The inferior vena cava is normal in size with greater than 50% respiratory variability, suggesting right atrial  pressure of 3 mmHg.  6. Distal aortic arch and descending aorta not well seen - history  of stent graft repair of aneurysm. FINDINGS  Left Ventricle: Left ventricular ejection fraction, by estimation, is 55 to 60%. The left ventricle has normal function. The left ventricle has no regional wall motion abnormalities. The left ventricular internal cavity size was small. There is mild left ventricular hypertrophy. Left ventricular diastolic parameters are consistent with Grade I diastolic dysfunction (impaired relaxation). Right Ventricle: The right ventricular size is normal. No increase in right ventricular wall thickness. Right ventricular systolic function is normal. Tricuspid regurgitation signal is inadequate for assessing PA pressure. Left Atrium: Left atrial size was normal in size. Right Atrium: Right atrial size was normal in size. Pericardium: There is no evidence of pericardial effusion. Presence of pericardial fat pad. Mitral Valve: The mitral valve is degenerative in appearance. There is mild calcification of the mitral valve leaflet(s). Moderate mitral annular calcification. Mild mitral valve regurgitation. Tricuspid Valve: The tricuspid valve is grossly normal. Tricuspid valve regurgitation is mild. Aortic Valve: The aortic valve is tricuspid. Aortic valve regurgitation is moderate. Mild to moderate aortic valve sclerosis/calcification is present, without any evidence of aortic stenosis. Moderate aortic valve annular calcification. Pulmonic Valve: The pulmonic valve was grossly normal. Pulmonic valve regurgitation is trivial. Aorta: The aortic root is normal in size and structure. Venous: The inferior vena cava is normal in size with greater than 50% respiratory variability, suggesting right atrial pressure of 3 mmHg. IAS/Shunts: The interatrial septum appears to be lipomatous. No atrial level shunt detected by color flow Doppler.  LEFT VENTRICLE PLAX 2D LVIDd:         3.60 cm  Diastology LVIDs:          2.80 cm  LV e' lateral:   5.33 cm/s LV PW:         1.00 cm  LV E/e' lateral: 15.2 LV IVS:        1.00 cm  LV e' medial:    4.90 cm/s LVOT diam:     2.00 cm  LV E/e' medial:  16.6 LV SV:         43 LV SV Index:   26 LVOT Area:     3.14 cm  RIGHT VENTRICLE RV S prime:     10.10 cm/s LEFT ATRIUM           Index       RIGHT ATRIUM           Index LA Vol (A4C): 28.0 ml 16.82 ml/m RA Area:     11.80 cm                                   RA Volume:   25.00 ml  15.02 ml/m  AORTIC VALVE LVOT Vmax:   100.00 cm/s LVOT Vmean:  66.500 cm/s LVOT VTI:    0.138 m  AORTA Ao Root diam: 3.20 cm MITRAL VALVE MV Area (PHT): 4.53 cm     SHUNTS MV Decel Time: 168 msec     Systemic VTI:  0.14 m MV E velocity: 81.10 cm/s   Systemic Diam: 2.00 cm MV A velocity: 100.00 cm/s MV E/A ratio:  0.81 Rozann Lesches MD Electronically signed by Rozann Lesches MD Signature Date/Time: 04/30/2020/10:53:52 AM    Final    VAS [place] UPPER EXTREMITY VENOUS DUPLEX  Result Date: 05/02/2020 UPPER VENOUS STUDY  Indications: Edema Performing Technologist: June Leap RDMS, RVT  Examination Guidelines: A complete evaluation includes B-mode imaging, spectral Doppler, color  Doppler, and power Doppler as needed of all accessible portions of each vessel. Bilateral testing is considered an integral part of a complete examination. Limited examinations for reoccurring indications may be performed as noted.  Right Findings: +-----+------------+---------+-----------+----------+--------------+ RIGHTCompressiblePhasicitySpontaneousProperties   Summary     +-----+------------+---------+-----------+----------+--------------+ IJV                                            Not visualized +-----+------------+---------+-----------+----------+--------------+  Left Findings: +----------+------------+---------+-----------+----------+-------+ LEFT      CompressiblePhasicitySpontaneousPropertiesSummary  +----------+------------+---------+-----------+----------+-------+ IJV           Full       Yes       Yes                      +----------+------------+---------+-----------+----------+-------+ Subclavian    Full       Yes       Yes                      +----------+------------+---------+-----------+----------+-------+ Axillary      Full       Yes       Yes                      +----------+------------+---------+-----------+----------+-------+ Brachial      Full                                          +----------+------------+---------+-----------+----------+-------+ Radial        Full                                          +----------+------------+---------+-----------+----------+-------+ Ulnar         Full                                          +----------+------------+---------+-----------+----------+-------+ Cephalic      Full                                          +----------+------------+---------+-----------+----------+-------+ Basilic       Full                                          +----------+------------+---------+-----------+----------+-------+  Summary:  Left: No evidence of deep vein thrombosis in the upper extremity. No evidence of superficial vein thrombosis in the upper extremity.  *See table(s) above for measurements and observations.  Diagnosing physician: Waverly Ferrari MD Electronically signed by Waverly Ferrari MD on 05/02/2020 at 6:06:02 PM.    Final     Lab Data:  CBC: Recent Labs  Lab 04/30/20 0615 04/30/20 0615 04/30/20 1654 04/30/20 2219 05/01/20 0412 05/02/20 0216 05/03/20 0510  WBC 8.2   < > 10.2 8.9 8.3 7.6 7.6  NEUTROABS 5.1  --   --   --   --   --   --  HGB 7.5*   < > 8.4* 7.9* 7.9* 7.5* 7.4*  HCT 23.7*   < > 26.2* 24.8* 24.7* 24.0* 23.9*  MCV 93.3   < > 92.3 93.2 93.2 95.2 95.6  PLT 122*   < > 102* 93* 95* 105* 122*   < > = values in this interval not displayed.   Basic Metabolic  Panel: Recent Labs  Lab 04/29/20 2204 04/30/20 0615 05/01/20 1034  NA 141 142 141  K 5.2* 4.5 3.7  CL 110 114* 117*  CO2 23 20* 21*  GLUCOSE 158* 136* 119*  BUN 47* 45* 19  CREATININE 1.56* 1.42* 1.09*  CALCIUM 9.2 8.2* 7.8*  MG  --  2.2  --   PHOS  --  4.3  --    GFR: Estimated Creatinine Clearance: 29.6 mL/min (A) (by C-G formula based on SCr of 1.09 mg/dL (H)). Liver Function Tests: Recent Labs  Lab 04/29/20 2204 04/30/20 0615  AST 13* 11*  ALT 10 9  ALKPHOS 42 28*  BILITOT 0.6 0.7  PROT 6.2* 5.1*  ALBUMIN 3.7 3.1*   No results for input(s): LIPASE, AMYLASE in the last 168 hours. No results for input(s): AMMONIA in the last 168 hours. Coagulation Profile: Recent Labs  Lab 04/29/20 2204  INR 1.1   Cardiac Enzymes: No results for input(s): CKTOTAL, CKMB, CKMBINDEX, TROPONINI in the last 168 hours. BNP (last 3 results) No results for input(s): PROBNP in the last 8760 hours. HbA1C: No results for input(s): HGBA1C in the last 72 hours. CBG: Recent Labs  Lab 05/01/20 0330 05/01/20 1252 05/01/20 1721 05/01/20 1951 05/02/20 0735  GLUCAP 107* 92 110* 129* 106*   Lipid Profile: No results for input(s): CHOL, HDL, LDLCALC, TRIG, CHOLHDL, LDLDIRECT in the last 72 hours. Thyroid Function Tests: No results for input(s): TSH, T4TOTAL, FREET4, T3FREE, THYROIDAB in the last 72 hours. Anemia Panel: No results for input(s): VITAMINB12, FOLATE, FERRITIN, TIBC, IRON, RETICCTPCT in the last 72 hours. Urine analysis:    Component Value Date/Time   COLORURINE YELLOW 04/29/2020 2333   APPEARANCEUR HAZY (A) 04/29/2020 2333   LABSPEC 1.014 04/29/2020 2333   PHURINE 5.0 04/29/2020 2333   GLUCOSEU NEGATIVE 04/29/2020 2333   HGBUR NEGATIVE 04/29/2020 2333   BILIRUBINUR NEGATIVE 04/29/2020 2333   KETONESUR NEGATIVE 04/29/2020 2333   PROTEINUR NEGATIVE 04/29/2020 2333   UROBILINOGEN 0.2 10/08/2015 0145   NITRITE NEGATIVE 04/29/2020 2333   LEUKOCYTESUR LARGE (A)  04/29/2020 2333     Vinnie Bobst M.D. [facility] 05/03/2020, 11:10 AM   Call night coverage person covering after 7pm

## 2020-05-04 LAB — TYPE AND SCREEN
ABO/RH(D): O POS
Antibody Screen: NEGATIVE
Unit division: 0

## 2020-05-04 LAB — BPAM RBC
Blood Product Expiration Date: 202107062359
ISSUE DATE / TIME: 202106021314
Unit Type and Rh: 5100

## 2020-05-04 LAB — CBC
HCT: 28.7 % — ABNORMAL LOW (ref 36.0–46.0)
Hemoglobin: 9 g/dL — ABNORMAL LOW (ref 12.0–15.0)
MCH: 29.9 pg (ref 26.0–34.0)
MCHC: 31.4 g/dL (ref 30.0–36.0)
MCV: 95.3 fL (ref 80.0–100.0)
Platelets: 128 10*3/uL — ABNORMAL LOW (ref 150–400)
RBC: 3.01 MIL/uL — ABNORMAL LOW (ref 3.87–5.11)
RDW: 16.6 % — ABNORMAL HIGH (ref 11.5–15.5)
WBC: 7.9 10*3/uL (ref 4.0–10.5)
nRBC: 0 % (ref 0.0–0.2)

## 2020-05-04 MED ORDER — HYDROCORTISONE ACETATE 25 MG RE SUPP: 25.0000 mg | Freq: Two times a day (BID) | RECTAL | 1 refills | Status: AC

## 2020-05-04 NOTE — Progress Notes (Signed)
Pt to be discharged to home this afternoon. Pt's Husband Runell Kovich and a caregiver given discharge teaching including Medications and schedules for these Medications reviewed with Pt's Husband and Caregiver. Understanding verbalized. Discharge packet with pt and family at time of discharge

## 2020-05-04 NOTE — Care Management Important Message (Signed)
Important Message  Patient Details  Name: Jennifer [name] MRN: [id number] Date of Birth: 1925-06-09   Medicare Important Message Given:      Document given to Lanier Clam, NCM  to give to the patient.   Chari Manning 05/04/2020, 10:25 AM

## 2020-05-04 NOTE — TOC Transition Note (Signed)
Transition of Care [facility]) - CM/SW Discharge Note   Patient Details  Name: Jennifer [name] MRN: [id number] Date of Birth: Dec 17, 1924  [facility] CM/SW Contact:  Lanier Clam, RN Phone Number: 05/04/2020, 9:18 AM   Clinical Narrative:d/c home today w/[facility]/OT. No further CM needs.       Final next level of care: Home w Home Health Services Barriers to Discharge: No Barriers Identified   Patient Goals and CMS Choice Patient states their goals for this hospitalization and ongoing recovery are:: go home CMS Medicare.gov Compare Post Acute Care list provided to:: Patient Represenative (must comment)(left vm with Son/spouse about HHC-Sonny.) Choice offered to / list presented to : Adult Children  Discharge Placement                       Discharge Plan and Services   Discharge Planning Services: CM Consult Post Acute Care Choice: Home Health                    HH Arranged: PT, OT [facility] Agency: [facility] Date [facility] Agency Contacted: 05/04/20 Time HH Agency Contacted: [phone] Representative spoke with at [facility] Agency: Kandee Keen  Social Determinants of Health (SDOH) Interventions     Readmission Risk Interventions No flowsheet data found.

## 2020-05-04 NOTE — Discharge Summary (Addendum)
Physician Discharge Summary   Patient ID: Jennifer [name] MRN: [id number] DOB/AGE: [birth year] 86 y.o.  Admit date: 04/29/2020 Discharge date: 05/04/2020  Primary Care Physician:  Lorenda Ishihara, MD   Recommendations for Outpatient Follow-up:  1. Follow up with PCP in 1-2 weeks 2. Outpatient CBC in 1 week and follow-up with GI  Home Health: Home health PT OT Equipment/Devices:   Discharge Condition: stable  CODE STATUS: FULL  Diet recommendation: Heart healthy diet   Discharge Diagnoses:    . Acute lower GI bleed . Acute blood loss anemia on chronic iron deficiency anemia . Acute on CKD (chronic kidney disease), stage IIIa . HTN (hypertension) . Hyperlipidemia . Acute blood loss anemia . Chronic diastolic CHF (HCC) . Atypical chest pain . Hyperkalemia . Prolonged QT interval Generalized debility   Consults: Gastroenterology    Allergies:   Allergies  Allergen Reactions  . Amoxicillin-Pot Clavulanate Diarrhea  . Sulfur     Unknown reaction   . Amoxicillin Rash  . Sulfamethoxazole Rash  . Tape Rash and Other (See Comments)    "paper" tape please, adhesive tape "burns'     DISCHARGE MEDICATIONS: Allergies as of 05/04/2020      Reactions   Amoxicillin-pot Clavulanate Diarrhea   Sulfur    Unknown reaction    Amoxicillin Rash   Sulfamethoxazole Rash   Tape Rash, Other (See Comments)   "paper" tape please, adhesive tape "burns'      Medication List    TAKE these medications   alendronate 70 MG tablet Commonly known as: FOSAMAX Take 70 mg by mouth once a week. Take with a full glass of water on an empty stomach. Wednesday Notes to patient: This Medication is to be taken once a week on Wednesday. Take with a full glass of water on a empty stomach.      atorvastatin 20 MG tablet Commonly known as: LIPITOR Take 20 mg by mouth daily. Notes to patient: Last Dose of this Medication was given on 05/04/2020 at 9:30 am    furosemide 20 MG  tablet Commonly known as: LASIX Take 20 mg by mouth 2 (two) times daily. Notes to patient: Last Dose of this Medication was given on 05/04/2020 at 7:29 am Please a second dose of this Medication this evening   hydrocortisone 25 MG suppository Commonly known as: ANUSOL-HC Place 1 suppository (25 mg total) rectally 2 (two) times daily. For hemorrhoids Notes to patient: Last Suppository rectally was given on 05/04/2020 at 9:31 am. Please take a second suppository tonight    hydrocortisone cream 0.5 % Apply 1 application topically as needed for itching.   JUICE PLUS FIBRE PO Take 3 tablets by mouth daily.   Klor-Con M20 20 MEQ tablet Generic drug: potassium chloride SA Take 20 mEq by mouth daily.   labetalol 100 MG tablet Commonly known as: NORMODYNE Take 1 tablet (100 mg total) by mouth 2 (two) times daily. Notes to patient: Last Dose of this Medication was given on 05/04/2020 at 9:30 am. Please take a second dose of this Medication tonight   omeprazole 40 MG capsule Commonly known as: PRILOSEC Take 40 mg by mouth daily.   phenylephrine-shark liver oil-mineral oil-petrolatum 0.25-3-14-71.9 % rectal ointment Commonly known as: PREPARATION H Place 1 application rectally 2 (two) times daily as needed for hemorrhoids.   SYSTANE OP Place 1 drop into both eyes 2 (two) times daily.   vitamin B-12 1000 MCG tablet Commonly known as: CYANOCOBALAMIN Take 1,000 mcg by mouth daily.  Vitamin D-3 125 MCG (5000 UT) Tabs Take 5,000 Units by mouth daily.        Brief H and P: For complete details please refer to admission H and P, but in brief Patient is a 86 year old female with history of CKD stage III, diabetes mellitus, chronic diastolic CHF, chronic iron deficiency anemia presented with rectal bleeding for 4 days PTA.  Patient had prior history of hemorrhoids and chronic blood loss.  No abdominal pain, no shortness of breath, nausea or vomiting.  Patient was taking naproxen. Had  bleeding scan with area of bleeding noted, status post IR angiography. GI was consulted.   Hospital Course:  Acute lower GI bleed, acute blood loss anemia on chronic iron deficiency anemia -Suspected lower GI source, no prior history of PUD, likely diverticular bleed -GI was consulted, hemoglobin 7.1 on admission, patient received 4 units packed RBCs hemoglobin 9.0 at the time of discharge -Colonoscopy on 5/21 showed no evidence of gross blood or fresh blood in the colon, just small amounts of old blood -Previous AAA repair but enteroscopy showed no evidence of ulceration or other abnormality in the third portion of duodenum -Likely diverticular bleed,  hence recommended outpatient CBC follow-up with her PCP. -If she continues to remain heme positive or worsening anemia, a small bowel capsule endoscopy might have to be considered.  Acute on chronic kidney disease stage IIIa -Creatinine 1.56, BUN 47 at the time of admission 1.2-1.3 -Avoid NSAIDs -Patient received packed RBC transfusion, creatinine 1.09 on 5/31   Essential hypertension  Initially antihypertensives held due to soft BP, now has been resumed  Hyperlipidemia Stable  Chronic diastolic CHF -Currently stable, euvolemic no acute issues 2D echo showed EF 55 to 60%, grade 1 diastolic dysfunction  Atypical chest pain -At the time of admission, possibly due to demand ischemia, EKG showed some ischemic changes.  Dr. Shawnie Pons reviewed with cardiology, no good options given bleeding, elevated troponin likely due to demand ischemia.  Could add beta-blocker if tolerates. -2D echo showed EF of 55 to 60% with grade 1 diastolic dysfunction, mitral valve degeneration with thickened valve and annular calcification with mild MR, moderate AR  Hyperkalemia Resolved  Prolonged QT -Avoid QT prolonging medications     Day of Discharge S: No acute complaints, hoping to go home today.  Eating breakfast without any difficulty.  No  overnight bleeding issues H&H currently stable  BP 128/68 (BP Location: Left Arm)   Pulse 66   Temp 98.3 F (36.8 C) (Oral)   Resp 20   Ht  (1.6 m)   Wt 63.8 kg   SpO2 94%   BMI 24.92 kg/m   Physical Exam: General: Alert and awake oriented x3 not in any acute distress. HEENT: anicteric sclera, pupils reactive to light and accommodation CVS: S1-S2 clear no murmur rubs or gallops Chest: clear to auscultation bilaterally, no wheezing rales or rhonchi Abdomen: soft nontender, nondistended, normal bowel sounds Extremities: no cyanosis, clubbing or edema noted bilaterally Neuro: Cranial nerves II-XII intact, no focal neurological deficits    Get Medicines reviewed and adjusted: Please take all your medications with you for your next visit with your Primary MD  Please request your Primary MD to go over all hospital tests and procedure/radiological results at the follow up. Please ask your Primary MD to get all Hospital records sent to his/her office.  If you experience worsening of your admission symptoms, develop shortness of breath, life threatening emergency, suicidal or homicidal thoughts you must seek medical  attention immediately by calling 911 or calling your MD immediately  if symptoms less severe.  You must read complete instructions/literature along with all the possible adverse reactions/side effects for all the Medicines you take and that have been prescribed to you. Take any new Medicines after you have completely understood and accept all the possible adverse reactions/side effects.   Do not drive when taking pain medications.   Do not take more than prescribed Pain, Sleep and Anxiety Medications  Special Instructions: If you have smoked or chewed Tobacco  in the last 2 yrs please stop smoking, stop any regular Alcohol  and or any Recreational drug use.  Wear Seat belts while driving.  Please note  You were cared for by a hospitalist during your hospital stay.  Once you are discharged, your primary care physician will handle any further medical issues. Please note that NO REFILLS for any discharge medications will be authorized once you are discharged, as it is imperative that you return to your primary care physician (or establish a relationship with a primary care physician if you do not have one) for your aftercare needs so that they can reassess your need for medications and monitor your lab values.   The results of significant diagnostics from this hospitalization (including imaging, microbiology, ancillary and laboratory) are listed below for reference.      Procedures/Studies:  NM GI Blood Loss  Result Date: 04/30/2020 CLINICAL DATA:  86 year old female with GI bleed. EXAM: NUCLEAR MEDICINE GASTROINTESTINAL BLEEDING SCAN TECHNIQUE: Sequential abdominal images were obtained following intravenous administration of Tc-71m labeled red blood cells. RADIOPHARMACEUTICALS:  20.1 mCi Tc-45m pertechnetate in-vitro labeled red cells. COMPARISON:  05/08/2015 CT and 04/26/2015 GI bleeding study. FINDINGS: The patient could only tolerate 1-1/2 hours of scanning. Abnormal activity is initially identified within the mid-upper RIGHT abdomen and extends to the LEFT side. Given appearance and location of bowel loops on prior CT, favor GI bleed originating from proximal small bowel although it is difficult to entirely exclude active bleeding originating within the colon at the hepatic flexure. IMPRESSION: Active GI bleeding.  Favor small bowel bleed as discussed above. Electronically Signed   By: Harmon Pier M.D.   On: 04/30/2020 14:15   IR Angiogram Visceral Selective  Result Date: 04/30/2020 INDICATION: Acute GI bleed requiring blood transfusion. Positive nuclear medicine bleeding scan in the region of small bowel and or proximal colon. History of prior abdominal aortic aneurysm resection with placement of aortic tube graft and prior TEVAR. EXAM: 1. ULTRASOUND GUIDANCE  FOR VASCULAR ACCESS OF THE RIGHT COMMON FEMORAL ARTERY 2. SELECTIVE ARTERIOGRAPHY OF THE SUPERIOR MESENTERIC ARTERY MEDICATIONS: 100 mcg intra-arterial nitroglycerin administered during the procedure. ANESTHESIA/SEDATION: Moderate (conscious) sedation was employed during this procedure. A total of Versed 2.5 mg and Fentanyl 100 mcg was administered intravenously. Moderate Sedation Time: 38 minutes. The patient's level of consciousness and vital signs were monitored continuously by radiology nursing throughout the procedure under my direct supervision. CONTRAST:  99 mL Visipaque 320 FLUOROSCOPY TIME:  Fluoroscopy Time: 54 seconds.  368 mGy. COMPLICATIONS: None immediate. PROCEDURE: Informed consent was obtained from the patient following explanation of the procedure, risks, benefits and alternatives. The patient understands, agrees and consents for the procedure. All questions were addressed. A time out was performed prior to the initiation of the procedure. Maximal barrier sterile technique utilized including caps, mask, sterile gowns, sterile gloves, large sterile drape, hand hygiene, and chlorhexidine prep. Ultrasound was used to confirm patency of the right common femoral artery. Under  direct ultrasound guidance, access of the artery was performed with a 21 gauge needle and micropuncture set. After establishing arterial access, a 5 French sheath was placed over a guidewire. A 5 French Cobra catheter was advanced into the abdominal aorta. This was used to selectively catheterize the superior mesenteric artery. The catheter was further advanced into the trunk of the SMA over a guidewire. Selective arteriography of the SMA was then performed in multiple projections prior to, and following, administration of intra-arterial nitroglycerin. Additional arteriography was performed through the catheter at the level of the distal aorta. The catheter was removed. Hemostasis was obtained after sheath removal utilizing the  Cordis ExoSeal device and manual compression. FINDINGS: Selective superior mesenteric arteriography demonstrates normal patency of supply to the small bowel and colon without evidence of active bleeding, arteriovenous malformation, pseudoaneurysm or other vascular abnormality. There is incidental replacement of the right hepatic artery off of the proximal SMA. A prominent Arc of Riolan is present extending into the IMA territory and supplying the descending colon and rectum. Additional injection at the level of the distal aorta confirms ligation and sacrifice of the inferior mesenteric artery at the time of abdominal aortic aneurysm repair. The distal segment of the aortic tube graft shows normal patency. IMPRESSION: Selective superior mesenteric arteriography does not demonstrate evidence of active bleeding source, AV malformation or other vascular abnormality. Embolization was not able to be performed. The SMA also supplies IMA territory via an Arc of Riolan after prior IMA ligation. Electronically Signed   By: Irish Lack M.D.   On: 04/30/2020 16:56   IR US Guide Vasc Access Right  Result Date: 04/30/2020 INDICATION: Acute GI bleed requiring blood transfusion. Positive nuclear medicine bleeding scan in the region of small bowel and or proximal colon. History of prior abdominal aortic aneurysm resection with placement of aortic tube graft and prior TEVAR. EXAM: 1. ULTRASOUND GUIDANCE FOR VASCULAR ACCESS OF THE RIGHT COMMON FEMORAL ARTERY 2. SELECTIVE ARTERIOGRAPHY OF THE SUPERIOR MESENTERIC ARTERY MEDICATIONS: 100 mcg intra-arterial nitroglycerin administered during the procedure. ANESTHESIA/SEDATION: Moderate (conscious) sedation was employed during this procedure. A total of Versed 2.5 mg and Fentanyl 100 mcg was administered intravenously. Moderate Sedation Time: 38 minutes. The patient's level of consciousness and vital signs were monitored continuously by radiology nursing throughout the procedure  under my direct supervision. CONTRAST:  99 mL Visipaque 320 FLUOROSCOPY TIME:  Fluoroscopy Time: 54 seconds.  368 mGy. COMPLICATIONS: None immediate. PROCEDURE: Informed consent was obtained from the patient following explanation of the procedure, risks, benefits and alternatives. The patient understands, agrees and consents for the procedure. All questions were addressed. A time out was performed prior to the initiation of the procedure. Maximal barrier sterile technique utilized including caps, mask, sterile gowns, sterile gloves, large sterile drape, hand hygiene, and chlorhexidine prep. Ultrasound was used to confirm patency of the right common femoral artery. Under direct ultrasound guidance, access of the artery was performed with a 21 gauge needle and micropuncture set. After establishing arterial access, a 5 French sheath was placed over a guidewire. A 5 French Cobra catheter was advanced into the abdominal aorta. This was used to selectively catheterize the superior mesenteric artery. The catheter was further advanced into the trunk of the SMA over a guidewire. Selective arteriography of the SMA was then performed in multiple projections prior to, and following, administration of intra-arterial nitroglycerin. Additional arteriography was performed through the catheter at the level of the distal aorta. The catheter was removed. Hemostasis was obtained after sheath  removal utilizing the Cordis ExoSeal device and manual compression. FINDINGS: Selective superior mesenteric arteriography demonstrates normal patency of supply to the small bowel and colon without evidence of active bleeding, arteriovenous malformation, pseudoaneurysm or other vascular abnormality. There is incidental replacement of the right hepatic artery off of the proximal SMA. A prominent Arc of Riolan is present extending into the IMA territory and supplying the descending colon and rectum. Additional injection at the level of the distal  aorta confirms ligation and sacrifice of the inferior mesenteric artery at the time of abdominal aortic aneurysm repair. The distal segment of the aortic tube graft shows normal patency. IMPRESSION: Selective superior mesenteric arteriography does not demonstrate evidence of active bleeding source, AV malformation or other vascular abnormality. Embolization was not able to be performed. The SMA also supplies IMA territory via an Arc of Riolan after prior IMA ligation. Electronically Signed   By: Irish Lack M.D.   On: 04/30/2020 16:56   ECHOCARDIOGRAM COMPLETE  Result Date: 04/30/2020    ECHOCARDIOGRAM REPORT   Patient Name:   SHAWNEE GAMBONE Date of Exam: 04/30/2020 Medical Rec #:  [id number]     Height:       63.0 in Accession #:    [id number]    Weight:       140.7 lb Date of Birth:  03-13-1925     BSA:          1.665 m Patient Age:    89 years      BP:           119/55 mmHg Patient Gender: F             HR:           88 bpm. Exam Location:  Inpatient Procedure: 2D Echo Indications:    Chest Pain 786.50 / R07.9  History:        Patient has prior history of Echocardiogram examinations, most                 recent 04/01/2015. Risk Factors:Diabetes, Hypertension and                 Dyslipidemia. CKD                 History of aortic aneurysm.  Sonographer:    Leeroy Bock Turrentine Referring Phys: [id number] ANASTASSIA DOUTOVA IMPRESSIONS  1. Left ventricular ejection fraction, by estimation, is 55 to 60%. The left ventricle has normal function. The left ventricle has no regional wall motion abnormalities. There is mild left ventricular hypertrophy. Left ventricular diastolic parameters are consistent with Grade I diastolic dysfunction (impaired relaxation).  2. Right ventricular systolic function is normal. The right ventricular size is normal. Tricuspid regurgitation signal is inadequate for assessing PA pressure.  3. The mitral valve is degenerative, mildly thickened and with moderate annular calcification. Mild mitral  valve regurgitation.  4. The aortic valve is tricuspid. Aortic valve regurgitation is moderate. Mild to moderate aortic valve sclerosis/calcification is present, without any evidence of aortic stenosis.  5. The inferior vena cava is normal in size with greater than 50% respiratory variability, suggesting right atrial pressure of 3 mmHg.  6. Distal aortic arch and descending aorta not well seen - history of stent graft repair of aneurysm. FINDINGS  Left Ventricle: Left ventricular ejection fraction, by estimation, is 55 to 60%. The left ventricle has normal function. The left ventricle has no regional wall motion abnormalities. The left ventricular internal cavity size was small. There is  mild left ventricular hypertrophy. Left ventricular diastolic parameters are consistent with Grade I diastolic dysfunction (impaired relaxation). Right Ventricle: The right ventricular size is normal. No increase in right ventricular wall thickness. Right ventricular systolic function is normal. Tricuspid regurgitation signal is inadequate for assessing PA pressure. Left Atrium: Left atrial size was normal in size. Right Atrium: Right atrial size was normal in size. Pericardium: There is no evidence of pericardial effusion. Presence of pericardial fat pad. Mitral Valve: The mitral valve is degenerative in appearance. There is mild calcification of the mitral valve leaflet(s). Moderate mitral annular calcification. Mild mitral valve regurgitation. Tricuspid Valve: The tricuspid valve is grossly normal. Tricuspid valve regurgitation is mild. Aortic Valve: The aortic valve is tricuspid. Aortic valve regurgitation is moderate. Mild to moderate aortic valve sclerosis/calcification is present, without any evidence of aortic stenosis. Moderate aortic valve annular calcification. Pulmonic Valve: The pulmonic valve was grossly normal. Pulmonic valve regurgitation is trivial. Aorta: The aortic root is normal in size and structure. Venous: The  inferior vena cava is normal in size with greater than 50% respiratory variability, suggesting right atrial pressure of 3 mmHg. IAS/Shunts: The interatrial septum appears to be lipomatous. No atrial level shunt detected by color flow Doppler.  LEFT VENTRICLE PLAX 2D LVIDd:         3.60 cm  Diastology LVIDs:         2.80 cm  LV e' lateral:   5.33 cm/s LV PW:         1.00 cm  LV E/e' lateral: 15.2 LV IVS:        1.00 cm  LV e' medial:    4.90 cm/s LVOT diam:     2.00 cm  LV E/e' medial:  16.6 LV SV:         43 LV SV Index:   26 LVOT Area:     3.14 cm  RIGHT VENTRICLE RV S prime:     10.10 cm/s LEFT ATRIUM           Index       RIGHT ATRIUM           Index LA Vol (A4C): 28.0 ml 16.82 ml/m RA Area:     11.80 cm                                   RA Volume:   25.00 ml  15.02 ml/m  AORTIC VALVE LVOT Vmax:   100.00 cm/s LVOT Vmean:  66.500 cm/s LVOT VTI:    0.138 m  AORTA Ao Root diam: 3.20 cm MITRAL VALVE MV Area (PHT): 4.53 cm     SHUNTS MV Decel Time: 168 msec     Systemic VTI:  0.14 m MV E velocity: 81.10 cm/s   Systemic Diam: 2.00 cm MV A velocity: 100.00 cm/s MV E/A ratio:  0.81 Nona DellSamuel Mcdowell MD Electronically signed by Nona DellSamuel Mcdowell MD Signature Date/Time: 04/30/2020/10:53:52 AM    Final    VAS US UPPER EXTREMITY VENOUS DUPLEX  Result Date: 05/02/2020 UPPER VENOUS STUDY  Indications: Edema Performing Technologist: Jeb LeveringJill Parker RDMS, RVT  Examination Guidelines: A complete evaluation includes B-mode imaging, spectral Doppler, color Doppler, and power Doppler as needed of all accessible portions of each vessel. Bilateral testing is considered an integral part of a complete examination. Limited examinations for reoccurring indications may be performed as noted.  Right Findings: +-----+------------+---------+-----------+----------+--------------+ RIGHTCompressiblePhasicitySpontaneousProperties   Summary     +-----+------------+---------+-----------+----------+--------------+  IJV                                             Not visualized +-----+------------+---------+-----------+----------+--------------+  Left Findings: +----------+------------+---------+-----------+----------+-------+ LEFT      CompressiblePhasicitySpontaneousPropertiesSummary +----------+------------+---------+-----------+----------+-------+ IJV           Full       Yes       Yes                      +----------+------------+---------+-----------+----------+-------+ Subclavian    Full       Yes       Yes                      +----------+------------+---------+-----------+----------+-------+ Axillary      Full       Yes       Yes                      +----------+------------+---------+-----------+----------+-------+ Brachial      Full                                          +----------+------------+---------+-----------+----------+-------+ Radial        Full                                          +----------+------------+---------+-----------+----------+-------+ Ulnar         Full                                          +----------+------------+---------+-----------+----------+-------+ Cephalic      Full                                          +----------+------------+---------+-----------+----------+-------+ Basilic       Full                                          +----------+------------+---------+-----------+----------+-------+  Summary:  Left: No evidence of deep vein thrombosis in the upper extremity. No evidence of superficial vein thrombosis in the upper extremity.  *See table(s) above for measurements and observations.  Diagnosing physician: Deitra Mayo MD Electronically signed by Deitra Mayo MD on 05/02/2020 at 6:06:02 PM.    Final        LAB RESULTS: Basic Metabolic Panel: Recent Labs  Lab 04/30/20 0615 05/01/20 1034  NA 142 141  K 4.5 3.7  CL 114* 117*  CO2 20* 21*  GLUCOSE 136* 119*  BUN 45* 19  CREATININE 1.42* 1.09*  CALCIUM  8.2* 7.8*  MG 2.2  --   PHOS 4.3  --    Liver Function Tests: Recent Labs  Lab 04/29/20 2204 04/30/20 0615  AST 13* 11*  ALT 10 9  ALKPHOS 42 28*  BILITOT 0.6 0.7  PROT 6.2* 5.1*  ALBUMIN 3.7 3.1*  No results for input(s): LIPASE, AMYLASE in the last 168 hours. No results for input(s): AMMONIA in the last 168 hours. CBC: Recent Labs  Lab 04/30/20 0615 04/30/20 1654 05/03/20 0510 05/03/20 0510 05/03/20 1902 05/04/20 0447  WBC 8.2   < > 7.6  --   --  7.9  NEUTROABS 5.1  --   --   --   --   --   HGB 7.5*   < > 7.4*   < > 8.6* 9.0*  HCT 23.7*   < > 23.9*   < > 27.0* 28.7*  MCV 93.3   < > 95.6   < >  --  95.3  PLT 122*   < > 122*  --   --  128*   < > = values in this interval not displayed.   Cardiac Enzymes: No results for input(s): CKTOTAL, CKMB, CKMBINDEX, TROPONINI in the last 168 hours. BNP: Invalid input(s): POCBNP CBG: Recent Labs  Lab 05/01/20 1951 05/02/20 0735  GLUCAP 129* 106*       Disposition and Follow-up: Discharge Instructions    Diet - low sodium heart healthy   Complete by: As directed    Increase activity slowly   Complete by: As directed        DISPOSITION: Home with home health   DISCHARGE FOLLOW-UP Follow-up Information    Buccini, Robert, MD. Schedule an appointment as soon as possible for a visit in 2 week(s).   Specialty: Gastroenterology Contact information: [address] [place] [address] [phone]        Lorenda Ishihara, MD. Schedule an appointment as soon as possible for a visit in 2 week(s).   Specialty: Internal Medicine Why: please get CBC in 1 week for the blood counts  Contact information: [address] [place] [address] [phone]        Care, [facility] Follow up.   Specialty: Home Health Services Why: [facility] physical therapy/occupational therapy Contact information: [address] [place] [address] [phone]             Time coordinating discharge:  35 minutes  Signed:   Thad Ranger M.D. [facility] 05/04/2020, 1:12 PM

## 2020-05-11 DIAGNOSIS — I10 Essential (primary) hypertension: Secondary | ICD-10-CM | POA: Diagnosis not present

## 2020-05-11 DIAGNOSIS — K922 Gastrointestinal hemorrhage, unspecified: Secondary | ICD-10-CM | POA: Diagnosis not present

## 2020-05-11 DIAGNOSIS — I5032 Chronic diastolic (congestive) heart failure: Secondary | ICD-10-CM | POA: Diagnosis not present

## 2020-05-17 DIAGNOSIS — D500 Iron deficiency anemia secondary to blood loss (chronic): Secondary | ICD-10-CM | POA: Diagnosis not present

## 2020-05-17 DIAGNOSIS — Z8719 Personal history of other diseases of the digestive system: Secondary | ICD-10-CM | POA: Diagnosis not present

## 2020-05-17 DIAGNOSIS — K644 Residual hemorrhoidal skin tags: Secondary | ICD-10-CM | POA: Diagnosis not present

## 2020-05-23 DIAGNOSIS — D500 Iron deficiency anemia secondary to blood loss (chronic): Secondary | ICD-10-CM | POA: Diagnosis not present

## 2020-05-23 DIAGNOSIS — I5032 Chronic diastolic (congestive) heart failure: Secondary | ICD-10-CM | POA: Diagnosis not present

## 2020-05-23 DIAGNOSIS — E1122 Type 2 diabetes mellitus with diabetic chronic kidney disease: Secondary | ICD-10-CM | POA: Diagnosis not present

## 2020-05-23 DIAGNOSIS — D631 Anemia in chronic kidney disease: Secondary | ICD-10-CM | POA: Diagnosis not present

## 2020-05-23 DIAGNOSIS — N1831 Chronic kidney disease, stage 3a: Secondary | ICD-10-CM | POA: Diagnosis not present

## 2020-05-23 DIAGNOSIS — K649 Unspecified hemorrhoids: Secondary | ICD-10-CM | POA: Diagnosis not present

## 2020-05-23 DIAGNOSIS — K5791 Diverticulosis of intestine, part unspecified, without perforation or abscess with bleeding: Secondary | ICD-10-CM | POA: Diagnosis not present

## 2020-05-23 DIAGNOSIS — I130 Hypertensive heart and chronic kidney disease with heart failure and stage 1 through stage 4 chronic kidney disease, or unspecified chronic kidney disease: Secondary | ICD-10-CM | POA: Diagnosis not present

## 2020-05-23 DIAGNOSIS — F0390 Unspecified dementia without behavioral disturbance: Secondary | ICD-10-CM | POA: Diagnosis not present

## 2020-05-23 DIAGNOSIS — E1151 Type 2 diabetes mellitus with diabetic peripheral angiopathy without gangrene: Secondary | ICD-10-CM | POA: Diagnosis not present

## 2020-05-29 DIAGNOSIS — K5791 Diverticulosis of intestine, part unspecified, without perforation or abscess with bleeding: Secondary | ICD-10-CM | POA: Diagnosis not present

## 2020-05-29 DIAGNOSIS — I5032 Chronic diastolic (congestive) heart failure: Secondary | ICD-10-CM | POA: Diagnosis not present

## 2020-05-29 DIAGNOSIS — K649 Unspecified hemorrhoids: Secondary | ICD-10-CM | POA: Diagnosis not present

## 2020-05-29 DIAGNOSIS — I130 Hypertensive heart and chronic kidney disease with heart failure and stage 1 through stage 4 chronic kidney disease, or unspecified chronic kidney disease: Secondary | ICD-10-CM | POA: Diagnosis not present

## 2020-05-29 DIAGNOSIS — N1831 Chronic kidney disease, stage 3a: Secondary | ICD-10-CM | POA: Diagnosis not present

## 2020-05-29 DIAGNOSIS — D631 Anemia in chronic kidney disease: Secondary | ICD-10-CM | POA: Diagnosis not present

## 2020-05-29 DIAGNOSIS — E1122 Type 2 diabetes mellitus with diabetic chronic kidney disease: Secondary | ICD-10-CM | POA: Diagnosis not present

## 2020-05-29 DIAGNOSIS — E1151 Type 2 diabetes mellitus with diabetic peripheral angiopathy without gangrene: Secondary | ICD-10-CM | POA: Diagnosis not present

## 2020-05-29 DIAGNOSIS — F0390 Unspecified dementia without behavioral disturbance: Secondary | ICD-10-CM | POA: Diagnosis not present

## 2020-06-14 DIAGNOSIS — I10 Essential (primary) hypertension: Secondary | ICD-10-CM | POA: Diagnosis not present

## 2020-06-14 DIAGNOSIS — F329 Major depressive disorder, single episode, unspecified: Secondary | ICD-10-CM | POA: Diagnosis not present

## 2020-06-14 DIAGNOSIS — I5032 Chronic diastolic (congestive) heart failure: Secondary | ICD-10-CM | POA: Diagnosis not present

## 2020-06-14 DIAGNOSIS — M810 Age-related osteoporosis without current pathological fracture: Secondary | ICD-10-CM | POA: Diagnosis not present

## 2020-06-14 DIAGNOSIS — I5030 Unspecified diastolic (congestive) heart failure: Secondary | ICD-10-CM | POA: Diagnosis not present

## 2020-06-14 DIAGNOSIS — E1151 Type 2 diabetes mellitus with diabetic peripheral angiopathy without gangrene: Secondary | ICD-10-CM | POA: Diagnosis not present

## 2020-06-14 DIAGNOSIS — D500 Iron deficiency anemia secondary to blood loss (chronic): Secondary | ICD-10-CM | POA: Diagnosis not present

## 2020-06-14 DIAGNOSIS — E1122 Type 2 diabetes mellitus with diabetic chronic kidney disease: Secondary | ICD-10-CM | POA: Diagnosis not present

## 2020-06-14 DIAGNOSIS — E785 Hyperlipidemia, unspecified: Secondary | ICD-10-CM | POA: Diagnosis not present

## 2020-07-05 DIAGNOSIS — E119 Type 2 diabetes mellitus without complications: Secondary | ICD-10-CM | POA: Diagnosis not present

## 2020-07-05 DIAGNOSIS — L602 Onychogryphosis: Secondary | ICD-10-CM | POA: Diagnosis not present

## 2020-07-05 DIAGNOSIS — L84 Corns and callosities: Secondary | ICD-10-CM | POA: Diagnosis not present

## 2020-07-05 DIAGNOSIS — M79671 Pain in right foot: Secondary | ICD-10-CM | POA: Diagnosis not present

## 2020-07-05 DIAGNOSIS — M79672 Pain in left foot: Secondary | ICD-10-CM | POA: Diagnosis not present

## 2020-07-07 DIAGNOSIS — E1121 Type 2 diabetes mellitus with diabetic nephropathy: Secondary | ICD-10-CM | POA: Diagnosis not present

## 2020-07-07 DIAGNOSIS — F329 Major depressive disorder, single episode, unspecified: Secondary | ICD-10-CM | POA: Diagnosis not present

## 2020-07-07 DIAGNOSIS — I5032 Chronic diastolic (congestive) heart failure: Secondary | ICD-10-CM | POA: Diagnosis not present

## 2020-07-07 DIAGNOSIS — E1122 Type 2 diabetes mellitus with diabetic chronic kidney disease: Secondary | ICD-10-CM | POA: Diagnosis not present

## 2020-07-07 DIAGNOSIS — M810 Age-related osteoporosis without current pathological fracture: Secondary | ICD-10-CM | POA: Diagnosis not present

## 2020-07-07 DIAGNOSIS — I5030 Unspecified diastolic (congestive) heart failure: Secondary | ICD-10-CM | POA: Diagnosis not present

## 2020-07-07 DIAGNOSIS — D500 Iron deficiency anemia secondary to blood loss (chronic): Secondary | ICD-10-CM | POA: Diagnosis not present

## 2020-07-07 DIAGNOSIS — E785 Hyperlipidemia, unspecified: Secondary | ICD-10-CM | POA: Diagnosis not present

## 2020-07-07 DIAGNOSIS — I10 Essential (primary) hypertension: Secondary | ICD-10-CM | POA: Diagnosis not present

## 2020-07-19 DIAGNOSIS — K644 Residual hemorrhoidal skin tags: Secondary | ICD-10-CM | POA: Diagnosis not present

## 2020-07-19 DIAGNOSIS — D500 Iron deficiency anemia secondary to blood loss (chronic): Secondary | ICD-10-CM | POA: Diagnosis not present

## 2020-07-19 DIAGNOSIS — Z8719 Personal history of other diseases of the digestive system: Secondary | ICD-10-CM | POA: Diagnosis not present

## 2020-07-19 DIAGNOSIS — K5900 Constipation, unspecified: Secondary | ICD-10-CM | POA: Diagnosis not present

## 2020-08-15 DIAGNOSIS — I10 Essential (primary) hypertension: Secondary | ICD-10-CM | POA: Diagnosis not present

## 2020-08-15 DIAGNOSIS — E785 Hyperlipidemia, unspecified: Secondary | ICD-10-CM | POA: Diagnosis not present

## 2020-08-15 DIAGNOSIS — E1151 Type 2 diabetes mellitus with diabetic peripheral angiopathy without gangrene: Secondary | ICD-10-CM | POA: Diagnosis not present

## 2020-08-15 DIAGNOSIS — E1121 Type 2 diabetes mellitus with diabetic nephropathy: Secondary | ICD-10-CM | POA: Diagnosis not present

## 2020-08-15 DIAGNOSIS — M810 Age-related osteoporosis without current pathological fracture: Secondary | ICD-10-CM | POA: Diagnosis not present

## 2020-08-15 DIAGNOSIS — I5032 Chronic diastolic (congestive) heart failure: Secondary | ICD-10-CM | POA: Diagnosis not present

## 2020-08-15 DIAGNOSIS — I5030 Unspecified diastolic (congestive) heart failure: Secondary | ICD-10-CM | POA: Diagnosis not present

## 2020-08-15 DIAGNOSIS — D500 Iron deficiency anemia secondary to blood loss (chronic): Secondary | ICD-10-CM | POA: Diagnosis not present

## 2020-08-15 DIAGNOSIS — E1122 Type 2 diabetes mellitus with diabetic chronic kidney disease: Secondary | ICD-10-CM | POA: Diagnosis not present

## 2020-08-21 DIAGNOSIS — M810 Age-related osteoporosis without current pathological fracture: Secondary | ICD-10-CM | POA: Diagnosis not present

## 2020-08-21 DIAGNOSIS — Z23 Encounter for immunization: Secondary | ICD-10-CM | POA: Diagnosis not present

## 2020-08-21 DIAGNOSIS — M4726 Other spondylosis with radiculopathy, lumbar region: Secondary | ICD-10-CM | POA: Diagnosis not present

## 2020-08-21 DIAGNOSIS — E559 Vitamin D deficiency, unspecified: Secondary | ICD-10-CM | POA: Diagnosis not present

## 2020-08-21 DIAGNOSIS — E1151 Type 2 diabetes mellitus with diabetic peripheral angiopathy without gangrene: Secondary | ICD-10-CM | POA: Diagnosis not present

## 2020-08-21 DIAGNOSIS — E785 Hyperlipidemia, unspecified: Secondary | ICD-10-CM | POA: Diagnosis not present

## 2020-08-21 DIAGNOSIS — K5901 Slow transit constipation: Secondary | ICD-10-CM | POA: Diagnosis not present

## 2020-08-21 DIAGNOSIS — R2689 Other abnormalities of gait and mobility: Secondary | ICD-10-CM | POA: Diagnosis not present

## 2020-08-21 DIAGNOSIS — I5032 Chronic diastolic (congestive) heart failure: Secondary | ICD-10-CM | POA: Diagnosis not present

## 2020-08-21 DIAGNOSIS — I10 Essential (primary) hypertension: Secondary | ICD-10-CM | POA: Diagnosis not present

## 2020-08-21 DIAGNOSIS — E1121 Type 2 diabetes mellitus with diabetic nephropathy: Secondary | ICD-10-CM | POA: Diagnosis not present

## 2020-08-21 DIAGNOSIS — N3281 Overactive bladder: Secondary | ICD-10-CM | POA: Diagnosis not present

## 2020-09-26 ENCOUNTER — Ambulatory Visit: Payer: Medicare HMO | Attending: Internal Medicine

## 2020-09-26 ENCOUNTER — Other Ambulatory Visit (HOSPITAL_BASED_OUTPATIENT_CLINIC_OR_DEPARTMENT_OTHER): Payer: Self-pay | Admitting: Internal Medicine

## 2020-09-26 DIAGNOSIS — Z23 Encounter for immunization: Secondary | ICD-10-CM

## 2020-09-26 NOTE — Progress Notes (Signed)
   Covid-19 Vaccination Clinic  Name:  SMRITHI PIGFORD    MRN: [id number] DOB: 1925/02/17  09/26/2020  Ms. Critcher was observed post Covid-19 immunization for 15 minutes without incident. She was provided with Vaccine Information Sheet and instruction to access the V-Safe system. Vaccinated by [employer].   Ms. Muradyan was instructed to call 911 with any severe reactions post vaccine: Marland Kitchen Difficulty breathing  . Swelling of face and throat  . A fast heartbeat  . A bad rash all over body  . Dizziness and weakness

## 2020-09-27 DIAGNOSIS — H524 Presbyopia: Secondary | ICD-10-CM | POA: Diagnosis not present

## 2020-09-27 DIAGNOSIS — E119 Type 2 diabetes mellitus without complications: Secondary | ICD-10-CM | POA: Diagnosis not present

## 2020-09-27 DIAGNOSIS — H26493 Other secondary cataract, bilateral: Secondary | ICD-10-CM | POA: Diagnosis not present

## 2020-09-27 DIAGNOSIS — Z135 Encounter for screening for eye and ear disorders: Secondary | ICD-10-CM | POA: Diagnosis not present

## 2020-09-27 DIAGNOSIS — Z0100 Encounter for examination of eyes and vision without abnormal findings: Secondary | ICD-10-CM | POA: Diagnosis not present

## 2020-09-28 ENCOUNTER — Emergency Department (HOSPITAL_COMMUNITY): Payer: Medicare HMO

## 2020-09-28 ENCOUNTER — Other Ambulatory Visit: Payer: Self-pay

## 2020-09-28 ENCOUNTER — Emergency Department (HOSPITAL_COMMUNITY)
Admission: EM | Admit: 2020-09-28 | Discharge: 2020-09-29 | Disposition: A | Discharge status: Home / Self Care | Payer: Medicare HMO | Attending: Emergency Medicine | Admitting: Emergency Medicine

## 2020-09-28 DIAGNOSIS — B9689 Other specified bacterial agents as the cause of diseases classified elsewhere: Secondary | ICD-10-CM | POA: Insufficient documentation

## 2020-09-28 DIAGNOSIS — M79651 Pain in right thigh: Secondary | ICD-10-CM | POA: Diagnosis not present

## 2020-09-28 DIAGNOSIS — I5033 Acute on chronic diastolic (congestive) heart failure: Secondary | ICD-10-CM | POA: Insufficient documentation

## 2020-09-28 DIAGNOSIS — W19XXXA Unspecified fall, initial encounter: Secondary | ICD-10-CM

## 2020-09-28 DIAGNOSIS — Z880 Allergy status to penicillin: Secondary | ICD-10-CM | POA: Insufficient documentation

## 2020-09-28 DIAGNOSIS — R2681 Unsteadiness on feet: Secondary | ICD-10-CM | POA: Insufficient documentation

## 2020-09-28 DIAGNOSIS — Y92009 Unspecified place in unspecified non-institutional (private) residence as the place of occurrence of the external cause: Secondary | ICD-10-CM | POA: Insufficient documentation

## 2020-09-28 DIAGNOSIS — E1122 Type 2 diabetes mellitus with diabetic chronic kidney disease: Secondary | ICD-10-CM | POA: Insufficient documentation

## 2020-09-28 DIAGNOSIS — Z9181 History of falling: Secondary | ICD-10-CM | POA: Insufficient documentation

## 2020-09-28 DIAGNOSIS — R627 Adult failure to thrive: Secondary | ICD-10-CM | POA: Insufficient documentation

## 2020-09-28 DIAGNOSIS — N390 Urinary tract infection, site not specified: Secondary | ICD-10-CM

## 2020-09-28 DIAGNOSIS — I10 Essential (primary) hypertension: Secondary | ICD-10-CM | POA: Diagnosis not present

## 2020-09-28 DIAGNOSIS — Z87891 Personal history of nicotine dependence: Secondary | ICD-10-CM | POA: Insufficient documentation

## 2020-09-28 DIAGNOSIS — W010XXA Fall on same level from slipping, tripping and stumbling without subsequent striking against object, initial encounter: Secondary | ICD-10-CM | POA: Diagnosis not present

## 2020-09-28 DIAGNOSIS — F0390 Unspecified dementia without behavioral disturbance: Secondary | ICD-10-CM | POA: Diagnosis not present

## 2020-09-28 DIAGNOSIS — R262 Difficulty in walking, not elsewhere classified: Secondary | ICD-10-CM | POA: Insufficient documentation

## 2020-09-28 DIAGNOSIS — N183 Chronic kidney disease, stage 3 unspecified: Secondary | ICD-10-CM | POA: Diagnosis not present

## 2020-09-28 DIAGNOSIS — N760 Acute vaginitis: Secondary | ICD-10-CM | POA: Diagnosis not present

## 2020-09-28 DIAGNOSIS — Y9301 Activity, walking, marching and hiking: Secondary | ICD-10-CM | POA: Insufficient documentation

## 2020-09-28 DIAGNOSIS — S0990XA Unspecified injury of head, initial encounter: Secondary | ICD-10-CM | POA: Diagnosis not present

## 2020-09-28 DIAGNOSIS — Z043 Encounter for examination and observation following other accident: Secondary | ICD-10-CM | POA: Diagnosis not present

## 2020-09-28 DIAGNOSIS — R519 Headache, unspecified: Secondary | ICD-10-CM | POA: Diagnosis present

## 2020-09-28 DIAGNOSIS — I130 Hypertensive heart and chronic kidney disease with heart failure and stage 1 through stage 4 chronic kidney disease, or unspecified chronic kidney disease: Secondary | ICD-10-CM | POA: Insufficient documentation

## 2020-09-28 DIAGNOSIS — Z79899 Other long term (current) drug therapy: Secondary | ICD-10-CM | POA: Insufficient documentation

## 2020-09-28 LAB — CBC WITH DIFFERENTIAL/PLATELET
Abs Immature Granulocytes: 0.01 10*3/uL (ref 0.00–0.07)
Basophils Absolute: 0 10*3/uL (ref 0.0–0.1)
Basophils Relative: 0 %
Eosinophils Absolute: 0 10*3/uL (ref 0.0–0.5)
Eosinophils Relative: 0 %
HCT: 33.4 % — ABNORMAL LOW (ref 36.0–46.0)
Hemoglobin: 10.2 g/dL — ABNORMAL LOW (ref 12.0–15.0)
Immature Granulocytes: 0 %
Lymphocytes Relative: 29 %
Lymphs Abs: 1.6 10*3/uL (ref 0.7–4.0)
MCH: 27.8 pg (ref 26.0–34.0)
MCHC: 30.5 g/dL (ref 30.0–36.0)
MCV: 91 fL (ref 80.0–100.0)
Monocytes Absolute: 0.8 10*3/uL (ref 0.1–1.0)
Monocytes Relative: 14 %
Neutro Abs: 3.1 10*3/uL (ref 1.7–7.7)
Neutrophils Relative %: 57 %
Platelets: 145 10*3/uL — ABNORMAL LOW (ref 150–400)
RBC: 3.67 MIL/uL — ABNORMAL LOW (ref 3.87–5.11)
RDW: 17.3 % — ABNORMAL HIGH (ref 11.5–15.5)
WBC: 5.5 10*3/uL (ref 4.0–10.5)
nRBC: 0 % (ref 0.0–0.2)

## 2020-09-28 LAB — URINALYSIS, ROUTINE W REFLEX MICROSCOPIC
Bilirubin Urine: NEGATIVE
Glucose, UA: NEGATIVE mg/dL
Ketones, ur: NEGATIVE mg/dL
Nitrite: NEGATIVE
Protein, ur: 100 mg/dL — AB
Specific Gravity, Urine: 1.015 (ref 1.005–1.030)
WBC, UA: >50 WBC/hpf — ABNORMAL HIGH (ref 0–5)
pH: 5 (ref 5.0–8.0)

## 2020-09-28 LAB — COMPREHENSIVE METABOLIC PANEL
ALT: 14 U/L (ref 0–44)
AST: 20 U/L (ref 15–41)
Albumin: 4 g/dL (ref 3.5–5.0)
Alkaline Phosphatase: 42 U/L (ref 38–126)
Anion gap: 10 (ref 5–15)
BUN: 28 mg/dL — ABNORMAL HIGH (ref 8–23)
CO2: 24 mmol/L (ref 22–32)
Calcium: 9.2 mg/dL (ref 8.9–10.3)
Chloride: 103 mmol/L (ref 98–111)
Creatinine, Ser: 1.58 mg/dL — ABNORMAL HIGH (ref 0.44–1.00)
GFR, Estimated: 30 mL/min — ABNORMAL LOW (ref >60)
Glucose, Bld: 101 mg/dL — ABNORMAL HIGH (ref 70–99)
Potassium: 4.1 mmol/L (ref 3.5–5.1)
Sodium: 137 mmol/L (ref 135–145)
Total Bilirubin: 0.6 mg/dL (ref 0.3–1.2)
Total Protein: 7.5 g/dL (ref 6.5–8.1)

## 2020-09-28 LAB — CBG MONITORING, ED: Glucose-Capillary: 87 mg/dL (ref 70–99)

## 2020-09-28 MED ORDER — HYDROCORTISONE ACETATE 25 MG RE SUPP
25.0000 mg | Freq: Two times a day (BID) | RECTAL | Status: DC
  Administered 2020-09-28 – 2020-09-29 (×2): 25 mg via RECTAL
  Filled 2020-09-28 (×3): qty 1

## 2020-09-28 MED ORDER — VITAMIN B-12 1000 MCG PO TABS
1000.0000 ug | ORAL_TABLET | Freq: Every day | ORAL | Status: DC
  Administered 2020-09-28 – 2020-09-29 (×2): 1000 ug via ORAL
  Filled 2020-09-28 (×2): qty 1

## 2020-09-28 MED ORDER — ACETAMINOPHEN 325 MG PO TABS
650.0000 mg | ORAL_TABLET | Freq: Once | ORAL | Status: AC
  Administered 2020-09-28: 650 mg via ORAL
  Filled 2020-09-28: qty 2

## 2020-09-28 MED ORDER — JUICE PLUS FIBRE PO LIQD: 3.0000 | Freq: Every day | ORAL | Status: DC

## 2020-09-28 MED ORDER — FUROSEMIDE 40 MG PO TABS
20.0000 mg | ORAL_TABLET | Freq: Two times a day (BID) | ORAL | Status: DC
  Administered 2020-09-28 – 2020-09-29 (×2): 20 mg via ORAL
  Filled 2020-09-28 (×2): qty 1

## 2020-09-28 MED ORDER — VITAMIN D 25 MCG (1000 UNIT) PO TABS
5000.0000 [IU] | ORAL_TABLET | Freq: Every day | ORAL | Status: DC
  Administered 2020-09-28 – 2020-09-29 (×2): 5000 [IU] via ORAL
  Filled 2020-09-28 (×2): qty 5

## 2020-09-28 MED ORDER — SODIUM CHLORIDE 0.9 % IV BOLUS
500.0000 mL | Freq: Once | INTRAVENOUS | Status: AC
  Administered 2020-09-28: 500 mL via INTRAVENOUS

## 2020-09-28 MED ORDER — CEPHALEXIN 250 MG PO CAPS
250.0000 mg | ORAL_CAPSULE | Freq: Two times a day (BID) | ORAL | Status: DC
  Administered 2020-09-28 – 2020-09-29 (×3): 250 mg via ORAL
  Filled 2020-09-28 (×3): qty 1

## 2020-09-28 MED ORDER — LABETALOL HCL 100 MG PO TABS
100.0000 mg | ORAL_TABLET | Freq: Two times a day (BID) | ORAL | Status: DC
  Administered 2020-09-28 – 2020-09-29 (×2): 100 mg via ORAL
  Filled 2020-09-28 (×2): qty 1

## 2020-09-28 MED ORDER — PANTOPRAZOLE SODIUM 40 MG PO TBEC
80.0000 mg | DELAYED_RELEASE_TABLET | Freq: Every day | ORAL | Status: DC
  Administered 2020-09-28 – 2020-09-29 (×2): 80 mg via ORAL
  Filled 2020-09-28 (×2): qty 2

## 2020-09-28 MED ORDER — PHENYLEPHRINE-MINERAL OIL-PET 0.25-14-74.9 % RE OINT
1.0000 "application " | TOPICAL_OINTMENT | Freq: Two times a day (BID) | RECTAL | Status: DC | PRN
  Filled 2020-09-28: qty 57

## 2020-09-28 MED ORDER — ACETAMINOPHEN 325 MG PO TABS: 650.0000 mg | ORAL_TABLET | Freq: Four times a day (QID) | ORAL | Status: DC | PRN

## 2020-09-28 MED ORDER — CLOTRIMAZOLE 1 % EX CREA
TOPICAL_CREAM | Freq: Two times a day (BID) | CUTANEOUS | Status: DC
  Administered 2020-09-29: 1 via TOPICAL
  Filled 2020-09-28: qty 15

## 2020-09-28 MED ORDER — ATORVASTATIN CALCIUM 10 MG PO TABS
20.0000 mg | ORAL_TABLET | Freq: Every day | ORAL | Status: DC
  Administered 2020-09-28 – 2020-09-29 (×2): 20 mg via ORAL
  Filled 2020-09-28 (×2): qty 2

## 2020-09-28 NOTE — ED Notes (Signed)
O2 sats noted to decrease to 89%, good waveform. Daughter commented that she was asleep prior to RN entering the room, O2 sats increased to 96% while awake and talking.  EDP [place] made aware. Will continue to monitor.

## 2020-09-28 NOTE — NC FL2 (Signed)
[place] MEDICAID FL2 LEVEL OF CARE SCREENING TOOL     IDENTIFICATION  Patient Name: Jennifer [name] Birthdate: 06/04/1925 Sex: female Admission Date (Current Location): 09/28/2020  [place] and [place] Number:  Haynes Bast [id number] K Facility and Address:  [facility],  [address] [place] [address]      Provider Number: [id number]  Attending Physician Name and Address:  Pricilla Loveless, MD  Relative Name and Phone Number:  Lorenso Quarry [phone]    Current Level of Care: Hospital Recommended Level of Care: Skilled Nursing Facility Prior Approval Number:    Date Approved/Denied:   PASRR Number: [id number]  Discharge Plan: SNF    Current Diagnoses: Patient Active Problem List   Diagnosis Date Noted  . Chest pain 04/30/2020  . Abnormal ECG 04/30/2020  . Hyperkalemia 04/30/2020  . Prolonged QT interval 04/30/2020  . Anemia 10/08/2015  . Diastolic CHF (HCC) 10/08/2015  . Gastrointestinal hemorrhage with melena   . Pressure ulcer 04/27/2015  . Lower GI bleed 04/24/2015  . GI bleed 04/24/2015  . Acute respiratory distress 03/31/2015  . Hypertensive urgency 03/31/2015  . Anemia, iron deficiency 03/02/2015  . Klebsiella cystitis 03/02/2015  . Abscess of abdominal wall 02/28/2015  . Abdominal abscess 02/28/2015  . Abdominal wall abscess at site of surgical wound 02/28/2015  . Acute blood loss anemia 10/05/2014  . Thoracic aortic aneurysm (HCC) 09/27/2014  . Dissecting aortic aneurysm (any part), thoracic (HCC) 09/27/2014  . Aneurysm, thoracic aortic (HCC) 09/26/2014  . Hyperlipidemia 09/16/2014  . Diabetes (HCC) 09/13/2014  . CHF (congestive heart failure) (HCC) 09/13/2014  . HTN (hypertension) 09/13/2014  . Physical deconditioning 09/12/2014  . 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 Acute on chronic diastolic CHF (congestive heart failure), NYHA class 3 (HCC) 09/08/2014  . Left sided chest pain 09/08/2014  . CKD (chronic  kidney disease), stage III (HCC) 09/08/2014  . Acute respiratory failure with hypoxia (HCC) 09/01/2014  . Acute pulmonary edema (HCC) 09/01/2014  . Cardiogenic shock (HCC) 09/01/2014  . Intramural aortic hematoma (HCC) 08/28/2014    Orientation RESPIRATION BLADDER Height & Weight     Self, Situation, Place  Normal Incontinent (At present time, due to fall at home.) Weight: 135 lb (61.2 kg) Height:  5\' 3"  (160 cm)  BEHAVIORAL SYMPTOMS/MOOD NEUROLOGICAL BOWEL NUTRITION STATUS      Incontinent (At present time, due to fall at home.) Diet (Normal)  AMBULATORY STATUS COMMUNICATION OF NEEDS Skin   Limited Assist (At present time, due to fall at home.)   Normal                       Personal Care Assistance Level of Assistance  Bathing, Feeding, Dressing Bathing Assistance: Limited assistance (At present time, due to fall at home.) Feeding assistance: Independent Dressing Assistance: Limited assistance (At present time, due to fall at home.)     Functional Limitations Info  Sight, Hearing, Speech Sight Info: Adequate Hearing Info: Adequate Speech Info: Adequate    SPECIAL CARE FACTORS FREQUENCY  PT (By licensed PT), OT (By licensed OT)     PT Frequency: 5 times a week. OT Frequency: 5 times a week.            Contractures Contractures Info: Not present    Additional Factors Info  Code Status, Allergies Code Status Info: DNR Allergies Info: Amoxicillin-pot Clavulanate,Sulfur,Amoxicillin,Sulfamethoxazole, and Tape           Current Medications (09/28/2020):  This is the current  hospital active medication list Current Facility-Administered Medications  Medication Dose Route Frequency Provider Last Rate Last Admin  . cephALEXin (KEFLEX) capsule 250 mg  250 mg Oral Q12H Pricilla Loveless, MD   250 mg at 09/28/20 1337  . clotrimazole (LOTRIMIN) 1 % cream   Topical BID Pricilla Loveless, MD       Current Outpatient Medications  Medication Sig Dispense Refill  .  alendronate (FOSAMAX) 70 MG tablet Take 70 mg by mouth once a week. Take with a full glass of water on an empty stomach. Wednesday    . atorvastatin (LIPITOR) 20 MG tablet Take 20 mg by mouth daily.    . Cholecalciferol (VITAMIN D-3) 125 MCG (5000 UT) TABS Take 5,000 Units by mouth daily.    . furosemide (LASIX) 20 MG tablet Take 20 mg by mouth 2 (two) times daily.     . hydrocortisone (ANUSOL-HC) 25 MG suppository Place 1 suppository (25 mg total) rectally 2 (two) times daily. For hemorrhoids 60 suppository 1  . hydrocortisone cream 0.5 % Apply 1 application topically as needed for itching.    Marland Kitchen KLOR-CON M20 20 MEQ tablet Take 20 mEq by mouth daily.  2  . labetalol (NORMODYNE) 100 MG tablet Take 1 tablet (100 mg total) by mouth 2 (two) times daily. 60 tablet 0  . Nutritional Supplements (JUICE PLUS FIBRE PO) Take 3 tablets by mouth daily.     Marland Kitchen omeprazole (PRILOSEC) 40 MG capsule Take 40 mg by mouth daily.  2  . phenylephrine-shark liver oil-mineral oil-petrolatum (PREPARATION H) 0.25-3-14-71.9 % rectal ointment Place 1 application rectally 2 (two) times daily as needed for hemorrhoids.    Bertram Gala Glycol-Propyl Glycol (SYSTANE OP) Place 1 drop into both eyes 2 (two) times daily.     . vitamin B-12 (CYANOCOBALAMIN) 1000 MCG tablet Take 1,000 mcg by mouth daily.       Discharge Medications: Please see discharge summary for a list of discharge medications.  Relevant Imaging Results:  Relevant Lab Results:   Additional Information SSN#:  [id number]  Clearnce Leja C Tarpley-Carter, LCSWA

## 2020-09-28 NOTE — Progress Notes (Addendum)
TOC CM contacted [facility] and started prior auth for SNF rehab. Faxed clinicals to [employer]. Isidoro Donning RN CCM, [facility] ED TOC CM [phone]  Spoke to dtr, and she is wanting [facility]. Her husband is long term at [facility]. Will CSW call [employer] on tomorrow. Dtr does want SNF rehab first and them LTC at [facility]. She got [facility] and [facility], 03/06/2020 and Pfizer booster 09/25/2020. 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 Isidoro Donning RN CCM, [facility] ED TOC CM [phone]

## 2020-09-28 NOTE — ED Provider Notes (Signed)
[facility]-EMERGENCY DEPT Provider Note   CSN: [id number] Arrival date & time: 09/28/20  1129     History Chief Complaint  Patient presents with  . Fall  . Headache    Jennifer [name] is a 86 y.o. female.  HPI 86 year old female presents from home with a fall and difficulty walking.  History is primarily from the daughter.  The patient's husband is at a rehab facility and going into hospice and so no one has been taking care of her very much and living at home alone for the last month or so.  She has people check on her for a couple hours in the day.  Daughter is having a hard time getting her into a facility.  The patient fell this morning and was wearing socks so it seems like she probably slipped though the exact etiology is unclear.  The patient tells me the lights were off and this caused her to fall.  Hit her head and was briefly complaining of some neck pain though this seems to have resolved.  Has a mild headache.  The daughter indicated it seemed like it was hard to get her up to get on her feet to walk and was worried that she was acting a little slower than normal and slept more this morning.  She has also been complaining of dysuria for a couple days.   Past Medical History:  Diagnosis Date  . AAA (abdominal aortic aneurysm)-repaired   . Acute bronchitis   . Anemia   . CHF (congestive heart failure) (HCC)   . Diabetes mellitus without complication (HCC)   . GERD (gastroesophageal reflux disease)   . Hypertension   . Irregular heart beat   . Shortness of breath dyspnea   . Tachycardia    Unspecified    Patient Active Problem List   Diagnosis Date Noted  . Chest pain 04/30/2020  . Abnormal ECG 04/30/2020  . Hyperkalemia 04/30/2020  . Prolonged QT interval 04/30/2020  . Anemia 10/08/2015  . Diastolic CHF (HCC) 10/08/2015  . Gastrointestinal hemorrhage with melena   . Pressure ulcer 04/27/2015  . Lower GI bleed 04/24/2015  . GI bleed  04/24/2015  . Acute respiratory distress 03/31/2015  . Hypertensive urgency 03/31/2015  . Anemia, iron deficiency 03/02/2015  . Klebsiella cystitis 03/02/2015  . Abscess of abdominal wall 02/28/2015  . Abdominal abscess 02/28/2015  . Abdominal wall abscess at site of surgical wound 02/28/2015  . Acute blood loss anemia 10/05/2014  . Thoracic aortic aneurysm (HCC) 09/27/2014  . Dissecting aortic aneurysm (any part), thoracic (HCC) 09/27/2014  . Aneurysm, thoracic aortic (HCC) 09/26/2014  . Hyperlipidemia 09/16/2014  . Diabetes (HCC) 09/13/2014  . CHF (congestive heart failure) (HCC) 09/13/2014  . HTN (hypertension) 09/13/2014  . Physical deconditioning 09/12/2014  . 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 Acute on chronic diastolic CHF (congestive heart failure), NYHA class 3 (HCC) 09/08/2014  . Left sided chest pain 09/08/2014  . CKD (chronic kidney disease), stage III (HCC) 09/08/2014  . Acute respiratory failure with hypoxia (HCC) 09/01/2014  . Acute pulmonary edema (HCC) 09/01/2014  . Cardiogenic shock (HCC) 09/01/2014  . Intramural aortic hematoma (HCC) 08/28/2014    Past Surgical History:  Procedure Laterality Date  . ABDOMINAL AORTIC ANEURYSM REPAIR  2012   Dr. Arbie CookeyEarly  . ABDOMINAL HYSTERECTOMY     40 years ago, intestinal damage  . BIOPSY  05/01/2020   Procedure: BIOPSY;  Surgeon: Kathi DerBrahmbhatt, Parag, MD;  [facility];  Service: Gastroenterology;;  . BREAST LUMPECTOMY Left    Benign Mass  . COLONOSCOPY WITH PROPOFOL N/A 05/01/2020   Procedure: COLONOSCOPY WITH PROPOFOL;  Surgeon: Kathi Der, MD;  Location: [facility] ENDOSCOPY;  Service: Gastroenterology;  Laterality: N/A;  . ENTEROSCOPY N/A 05/01/2020   Procedure: ENTEROSCOPY;  Surgeon: Kathi Der, MD;  Location: [facility] ENDOSCOPY;  Service: Gastroenterology;  Laterality: N/A;  . ESOPHAGOGASTRODUODENOSCOPY (EGD) WITH PROPOFOL Left 10/09/2015   Procedure: ESOPHAGOGASTRODUODENOSCOPY (EGD) WITH  PROPOFOL;  Surgeon: Willis Modena, MD;  Location: [facility] ENDOSCOPY;  Service: Endoscopy;  Laterality: Left;  . INCISION AND DRAINAGE ABSCESS N/A 02/28/2015   Procedure: INCISION AND DRAINAGE ABSCESS ABDOMINAL WALL;  Surgeon: Claud Kelp, MD;  Location: [facility] OR;  Service: General;  Laterality: N/A;  . IR ANGIOGRAM VISCERAL SELECTIVE  04/30/2020  . IR RADIOLOGIST EVAL & MGMT  04/24/2017  . IR US GUIDE VASC ACCESS RIGHT  04/30/2020  . OOPHORECTOMY     40 years ago  . THORACIC AORTIC ENDOVASCULAR STENT GRAFT N/A 09/27/2014   Procedure: THORACIC AORTIC ENDOVASCULAR STENT GRAFT;  Surgeon: Nada Libman, MD;  Location: [facility] OR;  Service: Vascular;  Laterality: N/A;     OB History   No obstetric history on file.     Family History  Problem Relation Age of Onset  . Heart disease Sister   . Heart attack Mother   . Cancer Brother   . Heart attack Sister   . Heart attack Brother     Social History   Tobacco Use  . Smoking status: Former Smoker    Quit date: 1975    Years since quitting: 46.8  . Smokeless tobacco: Never Used  . Tobacco comment: quit smoking in 1975  Substance Use Topics  . Alcohol use: No  . Drug use: No    Home Medications Prior to Admission medications   Medication Sig Start Date End Date Taking? Authorizing Provider  alendronate (FOSAMAX) 70 MG tablet Take 70 mg by mouth once a week. Take with a full glass of water on an empty stomach. Wednesday    [provider]  atorvastatin (LIPITOR) 20 MG tablet Take 20 mg by mouth daily.    [provider]  Cholecalciferol (VITAMIN D-3) 125 MCG (5000 UT) TABS Take 5,000 Units by mouth daily.    [provider]  furosemide (LASIX) 20 MG tablet Take 20 mg by mouth 2 (two) times daily.     [provider]  hydrocortisone (ANUSOL-HC) 25 MG suppository Place 1 suppository (25 mg total) rectally 2 (two) times daily. For hemorrhoids 05/04/20   Rai, Delene Ruffini, MD  hydrocortisone cream 0.5 % Apply 1  application topically as needed for itching.    [provider]  KLOR-CON M20 20 MEQ tablet Take 20 mEq by mouth daily. 06/22/15   [provider]  labetalol (NORMODYNE) 100 MG tablet Take 1 tablet (100 mg total) by mouth 2 (two) times daily. 04/04/15   Penny Pia, MD  Nutritional Supplements (JUICE PLUS FIBRE PO) Take 3 tablets by mouth daily.     [provider]  omeprazole (PRILOSEC) 40 MG capsule Take 40 mg by mouth daily. 03/17/15   [provider]  phenylephrine-shark liver oil-mineral oil-petrolatum (PREPARATION H) 0.25-3-14-71.9 % rectal ointment Place 1 application rectally 2 (two) times daily as needed for hemorrhoids.    [provider]  Polyethyl Glycol-Propyl Glycol (SYSTANE OP) Place 1 drop into both eyes 2 (two) times daily.     [provider]  vitamin B-12 (CYANOCOBALAMIN) 1000 MCG tablet Take 1,000 mcg by mouth daily.    [provider]    Allergies    Amoxicillin-pot clavulanate, Sulfur, Amoxicillin, Sulfamethoxazole, and Tape  Review of Systems   Review of Systems  Unable to perform ROS: Dementia    Physical Exam Updated Vital Signs BP (!) 164/81   Pulse 79   Temp 98 F (36.7 C) (Oral)   Resp 18   Ht  (1.6 m)   Wt 61.2 kg   SpO2 95%   BMI 23.91 kg/m   Physical Exam Vitals and nursing note reviewed.  Constitutional:      General: She is not in acute distress.    Appearance: She is well-developed. She is not ill-appearing or diaphoretic.  HENT:     Head: Normocephalic and atraumatic.     Comments: No obvious head trauma    Right Ear: External ear normal.     Left Ear: External ear normal.     Nose: Nose normal.  Eyes:     General:        Right eye: No discharge.        Left eye: No discharge.  Cardiovascular:     Rate and Rhythm: Normal rate and regular rhythm.     Heart sounds: Normal heart sounds.  Pulmonary:     Effort: Pulmonary effort is normal.     Breath sounds: Normal breath  sounds.  Abdominal:     Palpations: Abdomen is soft.     Tenderness: There is no abdominal tenderness.  Musculoskeletal:     Cervical back: Normal range of motion and neck supple. Tenderness present. Muscular tenderness (mild) present.     Thoracic back: No tenderness.     Lumbar back: No tenderness.     Right hip: No bony tenderness. Normal range of motion.     Left hip: No bony tenderness. Normal range of motion.     Right upper leg: Tenderness present. No swelling or deformity.  Skin:    General: Skin is warm and dry.  Neurological:     Mental Status: She is alert.     Comments: Oriented to person, place, date. Disoriented to president. CN 3-12 grossly intact. 5/5 strength in all 4 extremities. Grossly normal sensation. Normal finger to nose.   Psychiatric:        Mood and Affect: Mood is not anxious.     ED Results / Procedures / Treatments   Labs (all labs ordered are listed, but only abnormal results are displayed) Labs Reviewed  URINALYSIS, ROUTINE W REFLEX MICROSCOPIC - Abnormal; Notable for the following components:      Result Value   APPearance CLOUDY (*)    Hgb urine dipstick SMALL (*)    Protein, ur 100 (*)    Leukocytes,Ua LARGE (*)    WBC, UA >50 (*)    Bacteria, UA FEW (*)    All other components within normal limits  COMPREHENSIVE METABOLIC PANEL - Abnormal; Notable for the following components:   Glucose, Bld 101 (*)    BUN 28 (*)    Creatinine, Ser 1.58 (*)    GFR, Estimated 30 (*)    All other components within normal limits  CBC WITH DIFFERENTIAL/PLATELET - Abnormal; Notable for the following components:   RBC 3.67 (*)    Hemoglobin 10.2 (*)    HCT 33.4 (*)    RDW 17.3 (*)    Platelets 145 (*)    All other components within  normal limits  URINE CULTURE  CBG MONITORING, ED    EKG EKG Interpretation  Date/Time:  Thursday September 28 2020 12:18:18 EDT Ventricular Rate:  72 PR Interval:    QRS Duration: 140 QT Interval:  426 QTC  Calculation: 467 R Axis:   -77 Text Interpretation: Sinus rhythm Atrial premature complex RBBB and LAFB Probable left ventricular hypertrophy similar to May 2021 Confirmed by Pricilla Loveless [id number]) on 09/28/2020 1:43:01 PM   Radiology DG Pelvis 1-2 Views  Result Date: 09/28/2020 CLINICAL DATA:  Fall EXAM: PELVIS - 1-2 VIEW COMPARISON:  None. FINDINGS: Decreased osseous mineralization limits sensitivity. There is no evidence of pelvic fracture or diastasis. No pelvic bone lesions are seen. IMPRESSION: No acute fracture or dislocation. Electronically Signed   By: Guadlupe Spanish M.D.   On: 09/28/2020 13:37   CT Head Wo Contrast  Result Date: 09/28/2020 CLINICAL DATA:  Fall today.  Head injury. EXAM: CT HEAD WITHOUT CONTRAST CT CERVICAL SPINE WITHOUT CONTRAST TECHNIQUE: Multidetector CT imaging of the head and cervical spine was performed following the standard protocol without intravenous contrast. Multiplanar CT image reconstructions of the cervical spine were also generated. COMPARISON:  None. FINDINGS: CT HEAD FINDINGS Brain: Generalized atrophy. Cerebellar atrophy. Extensive hypodensity throughout the cerebral white matter bilaterally which is symmetric. Negative for acute infarct, hemorrhage, mass small dural calcification over the left convexity. Vascular: Negative for hyperdense vessel Skull: Negative for skull fracture Sinuses/Orbits: Paranasal sinuses clear. Bilateral cataract extraction. Other: None CT CERVICAL SPINE FINDINGS Alignment: Mild anterolisthesis C3-4 Skull base and vertebrae: Negative for fracture or mass. Soft tissues and spinal canal: Thyroid goiter with heterogeneous gland. No focal nodule. No adenopathy. Disc levels: Multilevel disc degeneration and spurring C3 through C7. Bilateral facet degeneration. Upper chest: Stent graft aortic arch. Lung apices clear bilaterally. Other: None IMPRESSION: 1. Atrophy and chronic microvascular ischemic change in the white matter. No acute  intracranial abnormality 2. Cervical spondylosis.  Negative for fracture. Electronically Signed   By: Marlan Palau M.D.   On: 09/28/2020 13:12   CT Cervical Spine Wo Contrast  Result Date: 09/28/2020 CLINICAL DATA:  Fall today.  Head injury. EXAM: CT HEAD WITHOUT CONTRAST CT CERVICAL SPINE WITHOUT CONTRAST TECHNIQUE: Multidetector CT imaging of the head and cervical spine was performed following the standard protocol without intravenous contrast. Multiplanar CT image reconstructions of the cervical spine were also generated. COMPARISON:  None. FINDINGS: CT HEAD FINDINGS Brain: Generalized atrophy. Cerebellar atrophy. Extensive hypodensity throughout the cerebral white matter bilaterally which is symmetric. Negative for acute infarct, hemorrhage, mass small dural calcification over the left convexity. Vascular: Negative for hyperdense vessel Skull: Negative for skull fracture Sinuses/Orbits: Paranasal sinuses clear. Bilateral cataract extraction. Other: None CT CERVICAL SPINE FINDINGS Alignment: Mild anterolisthesis C3-4 Skull base and vertebrae: Negative for fracture or mass. Soft tissues and spinal canal: Thyroid goiter with heterogeneous gland. No focal nodule. No adenopathy. Disc levels: Multilevel disc degeneration and spurring C3 through C7. Bilateral facet degeneration. Upper chest: Stent graft aortic arch. Lung apices clear bilaterally. Other: None IMPRESSION: 1. Atrophy and chronic microvascular ischemic change in the white matter. No acute intracranial abnormality 2. Cervical spondylosis.  Negative for fracture. Electronically Signed   By: Marlan Palau M.D.   On: 09/28/2020 13:12   DG Femur Min 2 Views Right  Result Date: 09/28/2020 CLINICAL DATA:  Pain following fall EXAM: RIGHT FEMUR 2 VIEWS COMPARISON:  Right hip radiographs Apr 28, 2019 FINDINGS: Frontal and lateral views were obtained. No fracture or dislocation. No abnormal  periosteal reaction. No appreciable knee joint effusion. There  are multiple foci of arterial vascular calcification. IMPRESSION: No fracture or dislocation. No appreciable arthropathic change. Foci of atherosclerotic arterial vascular calcification noted. Electronically Signed   By: Bretta Bang III M.D.   On: 09/28/2020 12:52    Procedures Procedures (including critical care time)  Medications Ordered in ED Medications  clotrimazole (LOTRIMIN) 1 % cream (has no administration in time range)  cephALEXin (KEFLEX) capsule 250 mg (250 mg Oral Given 09/28/20 1337)  atorvastatin (LIPITOR) tablet 20 mg (has no administration in time range)  cholecalciferol (VITAMIN D3) tablet 5,000 Units (has no administration in time range)  furosemide (LASIX) tablet 20 mg (has no administration in time range)  hydrocortisone (ANUSOL-HC) suppository 25 mg (has no administration in time range)  labetalol (NORMODYNE) tablet 100 mg (has no administration in time range)  Juice Plus Fibre LIQD 3 tablet (has no administration in time range)  pantoprazole (PROTONIX) EC tablet 80 mg (has no administration in time range)  phenylephrine-shark liver oil-mineral oil-petrolatum (PREPARATION H) rectal ointment 1 application (has no administration in time range)  vitamin B-12 (CYANOCOBALAMIN) tablet 1,000 mcg (has no administration in time range)  acetaminophen (TYLENOL) tablet 650 mg (650 mg Oral Given 09/28/20 1337)  sodium chloride 0.9 % bolus 500 mL (500 mLs Intravenous New Bag/Given 09/28/20 1339)    ED Course  I have reviewed the triage vital signs and the nursing notes.  Pertinent labs & imaging results that were available during my care of the patient were reviewed by me and considered in my medical decision making (see chart for details).    MDM Rules/Calculators/A&P                          From a trauma perspective, patient's x-rays and CTs are benign.  These have been personally reviewed.  Labs are overall unremarkable.  Perhaps mild AKI though creatinine is near  baseline.  Her urinalysis does show concern for a UTI and given her complaint of dysuria we will treat this.  However on exam she also has vulvovaginitis and will be given topical antifungal.  Sounds like daughter cannot take care of the patient at home as the patient is living by herself.  TOC is working on placement.  Physical therapy has been consulted. Final Clinical Impression(s) / ED Diagnoses Final diagnoses:  Fall, initial encounter  Acute urinary tract infection  Vulvovaginitis    Rx / DC Orders ED Discharge Orders    None       Pricilla Loveless, MD 09/28/20 [phone]

## 2020-09-28 NOTE — TOC Initial Note (Signed)
Transition of Care [facility]) - Initial/Assessment Note    Patient Details  Name: Jennifer [name] MRN: [id number] Date of Birth: [birth year]  [facility] CM/SW Contact:    Divante Kotch C Tarpley-Carter, LCSWA Phone Number: 09/28/2020, 1:30 PM  Clinical Narrative:                 TOC CSW consulted with Bunny Lewis/pts daughter, pt has given permission for Bunny to make decisions on her behalf.    CSW spoke with Bunny about pts needs.  Bunny stated pt needs a long-term facility with memory care, due to pts Dementia.  Bunny also stated that she gives CSW permission to send pts information to SNF, until long-term facility is discovered.  Shenique Childers Tarpley-Carter, MSW, LCSW-A Pronouns:  She, Her, Hers                  [facility] Transitions of CareClinical Social Worker [email or web address] [phone]       Patient Goals and CMS Choice    To obtain SNF placement with a possibility for long-term placement.     Expected Discharge Plan and Services  Pt will obtain placement at SNF.                                              Prior Living Arrangements/Services    Pt was living alone at home.                   Activities of Daily Living      Permission Sought/Granted  Permission was given by pt for Bunny Lewis/pts daughter to decide about future placement.                Emotional Assessment              Admission diagnosis:  fell head pain Patient Active Problem List   Diagnosis Date Noted  . Chest pain 04/30/2020  . Abnormal ECG 04/30/2020  . Hyperkalemia 04/30/2020  . Prolonged QT interval 04/30/2020  . Anemia 10/08/2015  . Diastolic CHF (HCC) 10/08/2015  . Gastrointestinal hemorrhage with melena   . Pressure ulcer 04/27/2015  . Lower GI bleed 04/24/2015  . GI bleed 04/24/2015  . Acute respiratory distress 03/31/2015  . Hypertensive urgency 03/31/2015  . Anemia, iron deficiency 03/02/2015  . Klebsiella  cystitis 03/02/2015  . Abscess of abdominal wall 02/28/2015  . Abdominal abscess 02/28/2015  . Abdominal wall abscess at site of surgical wound 02/28/2015  . Acute blood loss anemia 10/05/2014  . Thoracic aortic aneurysm (HCC) 09/27/2014  . Dissecting aortic aneurysm (any part), thoracic (HCC) 09/27/2014  . Aneurysm, thoracic aortic (HCC) 09/26/2014  . Hyperlipidemia 09/16/2014  . Diabetes (HCC) 09/13/2014  . CHF (congestive heart failure) (HCC) 09/13/2014  . HTN (hypertension) 09/13/2014  . Physical deconditioning 09/12/2014  . 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 Acute on chronic diastolic CHF (congestive heart failure), NYHA class 3 (HCC) 09/08/2014  . Left sided chest pain 09/08/2014  . CKD (chronic kidney disease), stage III (HCC) 09/08/2014  . Acute respiratory failure with hypoxia (HCC) 09/01/2014  . Acute pulmonary edema (HCC) 09/01/2014  . Cardiogenic shock (HCC) 09/01/2014  . Intramural aortic hematoma (HCC) 08/28/2014   PCP:  Lorenda Ishihara, MD Pharmacy:   [facility] [phone] [facility] [place] [place] - [address] [place] [address] Phone: [phone] Fax: [phone]  [facility] [address] [place], [place] - [address] [place] [address] Phone: [phone] Fax: [phone]     Social Determinants of Health (SDOH) Interventions    Readmission Risk Interventions No flowsheet data found.

## 2020-09-28 NOTE — ED Triage Notes (Signed)
Pt POV from home accompanied by daughter-  Per daughter- Pt with unwitnessed fall this AM. Pt reports remembering fall, c/o left neck pain, reports she slipped because it was dark, hit head on wood cabinet.    Daughter reports some new confusion, difficulty with ambulation after fall, found mother on floor of bathroom appx 0730 today. Pt AOx3 at time of triage. Pt denies LOC, denies n/v. PT c/o anterior head pain.

## 2020-09-28 NOTE — Evaluation (Signed)
Physical Therapy Evaluation Patient Details Name: Jennifer [name] MRN: [id number] DOB: 1925-10-16 Today's Date: 09/28/2020   History of Present Illness  86 year old female presents from home with a fall and difficulty walking. CT of head negative for acute injury. Workup for UTI in progress.  PMH of DM2, HTN, CHF, AAA and repair, osteoporosis, GERD, DDD.  Clinical Impression  Pt admitted with above diagnosis. Pt ambulated 83' with RW with min assist to manage RW especially with turns. Moderate assist required for bed mobility. Pt is not safe to be home alone 2* memory deficits and high fall risk. SNF recommended.  Pt currently with functional limitations due to the deficits listed below (see PT Problem List). Pt will benefit from skilled PT to increase their independence and safety with mobility to allow discharge to the venue listed below.       Follow Up Recommendations SNF;Supervision for mobility/OOB;Supervision/Assistance - 24 hour    Equipment Recommendations  Rolling walker with 5" wheels;Wheelchair (measurements PT);Wheelchair cushion (measurements PT)    Recommendations for Other Services       Precautions / Restrictions Precautions Precautions: Fall Precaution Comments: pt fell in bathroom on day of admission; daughter reports pt hasn't had other falls in the past 6 months Restrictions Weight Bearing Restrictions: No      Mobility  Bed Mobility Overal bed mobility: Needs Assistance Bed Mobility: Supine to Sit     Supine to sit: Mod assist     General bed mobility comments: assist to raise trunk    Transfers Overall transfer level: Needs assistance Equipment used: Rolling walker (2 wheeled) Transfers: Sit to/from Stand Sit to Stand: Min guard;From elevated surface         General transfer comment: VCs hand placement  Ambulation/Gait Ambulation/Gait assistance: Min assist;Min guard Gait Distance (Feet): 50 Feet Assistive device: Rolling walker (2  wheeled) Gait Pattern/deviations: Step-through pattern;Trunk flexed Gait velocity: WFL   General Gait Details: min/guard for safety, min A to manage RW  with turns, no loss of balance  Stairs            Wheelchair Mobility    Modified Rankin (Stroke Patients Only)       Balance Overall balance assessment: Needs assistance;History of Falls   Sitting balance-Leahy Scale: Good     Standing balance support: Bilateral upper extremity supported Standing balance-Leahy Scale: Poor Standing balance comment: relies on BUE support                             Pertinent Vitals/Pain Pain Assessment: No/denies pain    Home Living Family/patient expects to be discharged to:: Private residence Living Arrangements: Alone Available Help at Discharge: Personal care attendant Type of Home: House Home Access: Stairs to enter Entrance Stairs-Rails: None Entrance Stairs-Number of Steps: 1 onto porch then threshold Home Layout: One level Home Equipment: Environmental consultant - 2 wheels;Shower seat Additional Comments: son is local but doesn't assist very much, daughter lives in [place] but has been visiting since early October (she returns to [place] in 2 days), grandson is local; RW is old and in poor condition    Prior Function Level of Independence: Needs assistance         Comments: walks with RW, denies other falls in past year; private aide 3-4 hours/day who assists with meal preparation, administering medication. Pt can dress self but needs assist for bathing. She is not able to reheat a meal for herself, and will skip meals  if not prompted to eat.     Hand Dominance        Extremity/Trunk Assessment   Upper Extremity Assessment Upper Extremity Assessment: Overall WFL for tasks assessed    Lower Extremity Assessment Lower Extremity Assessment: Overall WFL for tasks assessed    Cervical / Trunk Assessment Cervical / Trunk Assessment: Kyphotic  Communication    Communication: HOH  Cognition Arousal/Alertness: Awake/alert Behavior During Therapy: WFL for tasks assessed/performed Overall Cognitive Status: History of cognitive impairments - at baseline                                 General Comments: daughter reports memory issues for several years, some sundowning, decreased safety awareness (pt didn't call for help when she fell at home this morning -pt has lifeline and her daughter was in the home with her), requires prompting and assist for bathing/eating/medications      General Comments      Exercises     Assessment/Plan    PT Assessment Patient needs continued PT services  PT Problem List Decreased activity tolerance;Decreased balance;Decreased mobility;Decreased cognition       PT Treatment Interventions Gait training;Therapeutic activities;Therapeutic exercise;Balance training;Patient/family education;Functional mobility training    PT Goals (Current goals can be found in the Care Plan section)  Acute Rehab PT Goals Patient Stated Goal: daughter would like SNF as pt is not safe home alone PT Goal Formulation: With family Time For Goal Achievement: 10/12/20 Potential to Achieve Goals: Good    Frequency Min 3X/week   Barriers to discharge Decreased caregiver support      Co-evaluation               AM-PAC PT "6 Clicks" Mobility  Outcome Measure Help needed turning from your back to your side while in a flat bed without using bedrails?: A Little Help needed moving from lying on your back to sitting on the side of a flat bed without using bedrails?: A Lot Help needed moving to and from a bed to a chair (including a wheelchair)?: A Little Help needed standing up from a chair using your arms (e.g., wheelchair or bedside chair)?: A Little Help needed to walk in hospital room?: A Little Help needed climbing 3-5 steps with a railing? : A Little 6 Click Score: 17    End of Session Equipment Utilized During  Treatment: Gait belt Activity Tolerance: Patient tolerated treatment well Patient left: in chair;with call bell/phone within reach;with family/visitor present Nurse Communication: Mobility status PT Visit Diagnosis: Difficulty in walking, not elsewhere classified (R26.2);Unsteadiness on feet (R26.81);Adult, failure to thrive (R62.7);History of falling (Z91.81)    Time: 3244-0102 PT Time Calculation (min) (ACUTE ONLY): 40 min   Charges:   PT Evaluation $PT Eval Low Complexity: 1 Low PT Treatments $Gait Training: 8-22 mins $Therapeutic Activity: 8-22 mins        Ralene Bathe Kistler PT 09/28/2020  Acute Rehabilitation Services Pager [phone] Office [phone]

## 2020-09-29 DIAGNOSIS — D500 Iron deficiency anemia secondary to blood loss (chronic): Secondary | ICD-10-CM | POA: Diagnosis not present

## 2020-09-29 DIAGNOSIS — R2689 Other abnormalities of gait and mobility: Secondary | ICD-10-CM | POA: Diagnosis not present

## 2020-09-29 DIAGNOSIS — Z03818 Encounter for observation for suspected exposure to other biological agents ruled out: Secondary | ICD-10-CM | POA: Diagnosis not present

## 2020-09-29 DIAGNOSIS — Z9181 History of falling: Secondary | ICD-10-CM | POA: Diagnosis not present

## 2020-09-29 DIAGNOSIS — M2550 Pain in unspecified joint: Secondary | ICD-10-CM | POA: Diagnosis not present

## 2020-09-29 DIAGNOSIS — R4182 Altered mental status, unspecified: Secondary | ICD-10-CM | POA: Diagnosis not present

## 2020-09-29 DIAGNOSIS — W19XXXA Unspecified fall, initial encounter: Secondary | ICD-10-CM | POA: Diagnosis not present

## 2020-09-29 DIAGNOSIS — I5030 Unspecified diastolic (congestive) heart failure: Secondary | ICD-10-CM | POA: Diagnosis not present

## 2020-09-29 DIAGNOSIS — N760 Acute vaginitis: Secondary | ICD-10-CM | POA: Diagnosis not present

## 2020-09-29 DIAGNOSIS — N390 Urinary tract infection, site not specified: Secondary | ICD-10-CM | POA: Diagnosis not present

## 2020-09-29 DIAGNOSIS — M6281 Muscle weakness (generalized): Secondary | ICD-10-CM | POA: Diagnosis not present

## 2020-09-29 DIAGNOSIS — R5381 Other malaise: Secondary | ICD-10-CM | POA: Diagnosis not present

## 2020-09-29 DIAGNOSIS — U071 COVID-19: Secondary | ICD-10-CM | POA: Diagnosis not present

## 2020-09-29 DIAGNOSIS — N183 Chronic kidney disease, stage 3 unspecified: Secondary | ICD-10-CM | POA: Diagnosis not present

## 2020-09-29 DIAGNOSIS — N1832 Chronic kidney disease, stage 3b: Secondary | ICD-10-CM | POA: Diagnosis not present

## 2020-09-29 DIAGNOSIS — Z7401 Bed confinement status: Secondary | ICD-10-CM | POA: Diagnosis not present

## 2020-09-29 DIAGNOSIS — B9689 Other specified bacterial agents as the cause of diseases classified elsewhere: Secondary | ICD-10-CM | POA: Diagnosis not present

## 2020-09-29 DIAGNOSIS — R488 Other symbolic dysfunctions: Secondary | ICD-10-CM | POA: Diagnosis not present

## 2020-09-29 DIAGNOSIS — I130 Hypertensive heart and chronic kidney disease with heart failure and stage 1 through stage 4 chronic kidney disease, or unspecified chronic kidney disease: Secondary | ICD-10-CM | POA: Diagnosis not present

## 2020-09-29 DIAGNOSIS — R41841 Cognitive communication deficit: Secondary | ICD-10-CM | POA: Diagnosis not present

## 2020-09-29 DIAGNOSIS — R410 Disorientation, unspecified: Secondary | ICD-10-CM | POA: Diagnosis not present

## 2020-09-29 DIAGNOSIS — R2681 Unsteadiness on feet: Secondary | ICD-10-CM | POA: Diagnosis not present

## 2020-09-29 DIAGNOSIS — I5033 Acute on chronic diastolic (congestive) heart failure: Secondary | ICD-10-CM | POA: Diagnosis not present

## 2020-09-29 DIAGNOSIS — E1122 Type 2 diabetes mellitus with diabetic chronic kidney disease: Secondary | ICD-10-CM | POA: Diagnosis not present

## 2020-09-29 DIAGNOSIS — Z87891 Personal history of nicotine dependence: Secondary | ICD-10-CM | POA: Diagnosis not present

## 2020-09-29 DIAGNOSIS — Z79899 Other long term (current) drug therapy: Secondary | ICD-10-CM | POA: Diagnosis not present

## 2020-09-29 DIAGNOSIS — N3000 Acute cystitis without hematuria: Secondary | ICD-10-CM | POA: Diagnosis not present

## 2020-09-29 DIAGNOSIS — Z7189 Other specified counseling: Secondary | ICD-10-CM | POA: Diagnosis not present

## 2020-09-29 NOTE — Progress Notes (Signed)
TOC CM/CSW reached out to [facility].  Lowella Bandy was also aware of the family wanting acceptance to [facility].  Nikki agreed to review pt for acceptance.  CSW will continue to follow for dc needs.  Haniyah Maciolek Tarpley-Carter, MSW, LCSW-A Pronouns:  She, Her, Hers                  [facility] Transitions of CareClinical Social Worker [email or web address] [phone]

## 2020-09-29 NOTE — Progress Notes (Signed)
TOC CM/CSW pt is stable and ready for dc.  [facility] is as follows.  Room #:  206-W  Call Report:  [phone]  This information will also be shared with Judeth Cornfield, RN.  CSW will continue to follow for dc needs.  Naoki Migliaccio Tarpley-Carter, MSW, LCSW-A Pronouns:  She, Her, Hers                  [facility] Transitions of CareClinical Social Worker [email or web address] [phone]

## 2020-09-29 NOTE — ED Notes (Signed)
[facility] called for transport.  

## 2020-09-29 NOTE — Progress Notes (Signed)
TOC CM/CSW reached out to Rachel/[facility] for a status update.  Pt is not a [facility] pt.  CSW reached out to [facility] [phone], reference # [id number].  CSW reached out to [facility] to continue authorization.  Nikki agreed to [employer], and will update CSW with future updates.  CSW will continue to follow for dc needs.  Martise Waddell Tarpley-Carter, MSW, LCSW-A Pronouns:  She, Her, Hers                  [facility] Transitions of CareClinical Social Worker [email or web address] [phone]

## 2020-09-29 NOTE — ED Notes (Signed)
Daughter at bedside.

## 2020-09-29 NOTE — ED Notes (Signed)
Report called to [facility]. Given to [place]. Awaiting transport.

## 2020-09-29 NOTE — Progress Notes (Signed)
TOC CM/CSW spoke with Jennifer [name]/pts daughter.  CSW informed Jennifer that pt had been accepted to [facility]. Jennifer and pt was pleased to hear this information.  CSW will continue with discharge planning.CSW will continue to follow for dc needs.  Jennifer [name], MSW, LCSW-A Pronouns:  She, Her, Hers                  Jennifer [name] ED Transitions of CareClinical Social Worker [email or web address] [phone]

## 2020-09-29 NOTE — ED Notes (Signed)
P[t set up and given breakfast tray.

## 2020-09-29 NOTE — ED Notes (Signed)
Pt was transferred into a hospital bed and meal tray was given.

## 2020-09-29 NOTE — ED Notes (Signed)
[facility] here for transport. Daughter called and updated.

## 2020-09-29 NOTE — Progress Notes (Signed)
TOC CM/CSW received an update from [facility].  Lowella Bandy has received authorization.  Nikki requested AVS be faxed.  CSW faxed AVS.   CSW currently awaiting room # and call report information from [facility] once AVS is received.  CSW will continue to follow for dc needs.  Tagg Eustice Tarpley-Carter, MSW, LCSW-A Pronouns:  She, Her, Hers                  [facility] Transitions of CareClinical Social Worker [email or web address] [phone]

## 2020-09-30 LAB — URINE CULTURE

## 2020-10-02 ENCOUNTER — Encounter: Payer: Self-pay | Admitting: Internal Medicine

## 2020-10-02 ENCOUNTER — Telehealth: Payer: Self-pay | Admitting: Adult Health

## 2020-10-02 DIAGNOSIS — Z03818 Encounter for observation for suspected exposure to other biological agents ruled out: Secondary | ICD-10-CM | POA: Diagnosis not present

## 2020-10-02 NOTE — Progress Notes (Signed)
Provider:  Gwenith Spitz. Renato Gails, D.O., C.M.D. Location:  [facility] Room Number: 206W Place of Service:  SNF (31)  PCP: Lorenda Ishihara, MD Patient Care Team: Lorenda Ishihara, MD as PCP - General (Internal Medicine) Vida Rigger, MD as Consulting Physician (Gastroenterology) Corky Crafts, MD as Consulting Physician (Cardiology)  Extended Emergency Contact Information Primary Emergency Contact: Rometta Emery [place] Mobile Phone: [phone] Relation: Daughter Secondary Emergency Contact: Rahm,Vernon Address: [address]          [place], [place] Home Phone: [phone] Mobile Phone: [phone] Relation: Spouse  Code Status: DNR Goals of Care: Advanced Directive information Advanced Directives 10/03/2020  Does Patient Have a Medical Advance Directive? Yes  Type of Advance Directive Out of facility DNR (pink MOST or yellow form)  Does patient want to make changes to medical advance directive? No - Guardian declined  Would patient like information on creating a medical advance directive? -   Chief Complaint  Patient presents with  . New Admit To SNF    New admit     HPI: Patient is a 86 y.o. female seen today for admission to [facility] status post emergency department visit October 28-29 with a fall, headache, acute urinary tract infection and vulvovaginitis.  This is Langland has a history of thoracic aortic aneurysm, hypertension, diastolic heart failure, lower GI bleed, type 2 diabetes, previous pressure injury, chronic kidney disease stage III, iron deficiency anemia, prolonged QT interval, the past abdominal abscess.  She had been staying at home and her husband was already here at [facility] on hospice care.  Patient fell the morning of her ED visit and wearing socks and slipped though the exact etiology of her fall is unclear.  She said the lights were off and  she is unable to see.  She hit her head and briefly complained of some neck pain that then resolved.  She had a mild headache.  Was hard for her to get up and walk after the fall, she is acting a little bit slower than normal and slept more that morning.  He had also been complaining of dysuria for couple of days.  In the ED she was started on Keflex for urinary tract infection, given normal saline bolus of 500 cc, and Lotrimin cream 1%.  Her x-rays and CTs were all benign.  She had possible mild kidney injury versus her baseline.  Transition of care team arranged placement here at [facility] with her husband who is here on hospice care.  She will be receiving therapy here.  Last night NP on-call was notified that Mrs. Verdun had tested positive for Covid after an exposure to a family member.  Her husband is also tested positive.  She was told during a call that patient was asymptomatic.  She has been placed in isolation for 14 days.    Pt was delirious and had difficulty staying alert during visit.  She'd had drop in her sats to 84% so SBAR had been given to me about this.  She was placed on 4L O2 via [place] coming up to 94%.  She's had occasional coughing.  VS otherwise wnl--afebrile.   I spoke with her daughter, Hassie Bruce, by phone and we discussed the option of monoclonal antibody infusion on site, but pt has completed a MOST form indicating she does not want her life prolonged with antibiotics, fluids, or tube feedings.  She just  completed it in April.  Her husband is on hospice care and also covid positive.  They are now sharing a room here after their tests came back.  She has not been eating well for several weeks and does not want to keep living when her husband passes away.  She's said that she would stop eating if he dies.  Bunny would like for her mother also to receive hospice care if possible.  There may be insurance barriers to this b/c she's come here for rehab and they do not yet have medicaid.   Discussed that we will put into place many of the comfort measures (in terms of medications) offered.    Past Medical History:  Diagnosis Date  . AAA (abdominal aortic aneurysm)-repaired   . Acute bronchitis   . Anemia   . CHF (congestive heart failure) (HCC)   . Diabetes mellitus without complication (HCC)   . GERD (gastroesophageal reflux disease)   . Hypertension   . Irregular heart beat   . Shortness of breath dyspnea   . Tachycardia    Unspecified   Past Surgical History:  Procedure Laterality Date  . ABDOMINAL AORTIC ANEURYSM REPAIR  2012   Dr. Arbie Cookey  . ABDOMINAL HYSTERECTOMY     40 years ago, intestinal damage  . BIOPSY  05/01/2020   Procedure: BIOPSY;  Surgeon: Kathi Der, MD;  Location: [facility] ENDOSCOPY;  Service: Gastroenterology;;  . BREAST LUMPECTOMY Left    Benign Mass  . COLONOSCOPY WITH PROPOFOL N/A 05/01/2020   Procedure: COLONOSCOPY WITH PROPOFOL;  Surgeon: Kathi Der, MD;  Location: [facility] ENDOSCOPY;  Service: Gastroenterology;  Laterality: N/A;  . ENTEROSCOPY N/A 05/01/2020   Procedure: ENTEROSCOPY;  Surgeon: Kathi Der, MD;  Location: [facility]OSCOPY;  Service: Gastroenterology;  Laterality: N/A;  . ESOPHAGOGASTRODUODENOSCOPY (EGD) WITH PROPOFOL Left 10/09/2015   Procedure: ESOPHAGOGASTRODUODENOSCOPY (EGD) WITH PROPOFOL;  Surgeon: Willis Modena, MD;  Location: [facility] ENDOSCOPY;  Service: Endoscopy;  Laterality: Left;  . INCISION AND DRAINAGE ABSCESS N/A 02/28/2015   Procedure: INCISION AND DRAINAGE ABSCESS ABDOMINAL WALL;  Surgeon: Claud Kelp, MD;  Location: [facility] OR;  Service: General;  Laterality: N/A;  . IR ANGIOGRAM VISCERAL SELECTIVE  04/30/2020  . IR RADIOLOGIST EVAL & MGMT  04/24/2017  . IR US GUIDE VASC ACCESS RIGHT  04/30/2020  . OOPHORECTOMY     40 years ago  . THORACIC AORTIC ENDOVASCULAR STENT GRAFT N/A 09/27/2014   Procedure: THORACIC AORTIC ENDOVASCULAR STENT GRAFT;  Surgeon: Nada Libman, MD;  Location: [facility] OR;  Service: Vascular;   Laterality: N/A;    Social History   Socioeconomic History  . Marital status: Married    Spouse name: Not on file  . Number of children: Not on file  . Years of education: Not on file  . Highest education level: Not on file  Occupational History  . Not on file  Tobacco Use  . Smoking status: Former Smoker    Quit date: 1975    Years since quitting: 46.8  . Smokeless tobacco: Never Used  . Tobacco comment: quit smoking in 1975  Substance and Sexual Activity  . Alcohol use: No  . Drug use: No  . Sexual activity: Not on file  Other Topics Concern  . Not on file  Social History Narrative  . Not on file   Social Determinants of Health   Financial Resource Strain:   . Difficulty of Paying Living Expenses: Not on file  Food Insecurity:   . Worried About [employer] of  Food in the Last Year: Not on file  . Ran Out of Food in the Last Year: Not on file  Transportation Needs:   . Lack of Transportation (Medical): Not on file  . Lack of Transportation (Non-Medical): Not on file  Physical Activity:   . Days of Exercise per Week: Not on file  . Minutes of Exercise per Session: Not on file  Stress:   . Feeling of Stress : Not on file  Social Connections:   . Frequency of Communication with Friends and Family: Not on file  . Frequency of Social Gatherings with Friends and Family: Not on file  . Attends Religious Services: Not on file  . Active Member of Clubs or Organizations: Not on file  . Attends BankerClub or Organization Meetings: Not on file  . Marital Status: Not on file    reports that she quit smoking about 46 years ago. She has never used smokeless tobacco. She reports that she does not drink alcohol and does not use drugs.  Functional Status Survey:    Family History  Problem Relation Age of Onset  . Heart disease Sister   . Heart attack Mother   . Cancer Brother   . Heart attack Sister   . Heart attack Brother     Health Maintenance  Topic Date Due  . FOOT  EXAM  Never done  . OPHTHALMOLOGY EXAM  Never done  . URINE MICROALBUMIN  Never done  . TETANUS/TDAP  Never done  . DEXA SCAN  Never done  . PNA vac Low Risk Adult (1 of 2 - PCV13) Never done  . INFLUENZA VACCINE  07/02/2020  . COVID-19 Vaccine (2 - Pfizer 2-dose series) 10/17/2020  . HEMOGLOBIN A1C  10/31/2020    Allergies  Allergen Reactions  . Amoxicillin-Pot Clavulanate Diarrhea  . Sulfur     Unknown reaction   . Amoxicillin Rash  . Sulfamethoxazole Rash  . Tape Rash and Other (See Comments)    "paper" tape please, adhesive tape "burns'    Outpatient Encounter Medications as of 10/03/2020  Medication Sig  . Cholecalciferol (VITAMIN D-3) 125 MCG (5000 UT) TABS Take 5,000 Units by mouth daily.  . hydrocortisone (ANUSOL-HC) 25 MG suppository Place 1 suppository (25 mg total) rectally 2 (two) times daily. For hemorrhoids  . labetalol (NORMODYNE) 100 MG tablet Take 1 tablet (100 mg total) by mouth 2 (two) times daily.  Marland Kitchen. omeprazole (PRILOSEC) 40 MG capsule Take 40 mg by mouth daily.  . [DISCONTINUED] atorvastatin (LIPITOR) 20 MG tablet Take 20 mg by mouth daily.  . [DISCONTINUED] furosemide (LASIX) 20 MG tablet Take 20 mg by mouth 2 (two) times daily.   . [DISCONTINUED] KLOR-CON M20 20 MEQ tablet Take 20 mEq by mouth daily.  . [DISCONTINUED] vitamin B-12 (CYANOCOBALAMIN) 1000 MCG tablet Take 1,000 mcg by mouth daily.  Marland Kitchen. LORazepam (ATIVAN) 2 MG/ML concentrated solution Take 0.3 mLs (0.6 mg total) by mouth every 6 (six) hours as needed for anxiety (dyspnea).  . morphine (ROXANOL) 20 MG/ML concentrated solution Take 0.25 mLs (5 mg total) by mouth every 6 (six) hours as needed for moderate pain, severe pain or shortness of breath.  . senna-docusate (SENOKOT-S) 8.6-50 MG tablet Take 2 tablets by mouth daily as needed for mild constipation or moderate constipation.  . [DISCONTINUED] Nutritional Supplements (JUICE PLUS FIBRE PO) Take 6 tablets by mouth daily.   . [DISCONTINUED]  phenylephrine-shark liver oil-mineral oil-petrolatum (PREPARATION H) 0.25-14-74.9 % rectal ointment Place 1 application rectally 2 (two)  times daily as needed for hemorrhoids.  . [DISCONTINUED] shark liver oil-cocoa butter (PREPARATION H) 0.25-3-85.5 % suppository Place 1 suppository rectally as needed for hemorrhoids.  . [DISCONTINUED] Vitamins A & D (VITAMIN A & D) ointment Apply 1 application topically as needed (buttocks irritation).   No facility-administered encounter medications on file as of 10/03/2020.    Review of Systems  Constitutional: Positive for malaise/fatigue and weight loss. Negative for chills and fever.  HENT: Positive for hearing loss (very HOH and struggled to hear me in full PPE). Negative for congestion and sore throat.   Respiratory: Positive for cough. Negative for shortness of breath and wheezing.        No peripheral or central cyanosis, on 4L O2 and sitting up in bed  Cardiovascular: Negative for chest pain, palpitations and leg swelling.  Gastrointestinal: Negative for abdominal pain and constipation.  Genitourinary: Negative for dysuria.  Musculoskeletal: Positive for falls. Negative for joint pain.  Neurological: Positive for weakness. Negative for dizziness and loss of consciousness.  Psychiatric/Behavioral: Positive for depression.    Vitals:   10/03/20 1141  BP: 132/63  Pulse: 60  Temp: (!) 97.5 F (36.4 C)  TempSrc: Temporal  Weight: 135 lb (61.2 kg)  Height: 5\' 3"  (1.6 m)   Body mass index is 23.91 kg/m. Physical Exam Vitals reviewed.  Constitutional:      General: She is not in acute distress.    Appearance: She is ill-appearing. She is not toxic-appearing.     Comments: Lethargic, drifting back off to sleep between questions  HENT:     Head: Normocephalic and atraumatic.     Right Ear: External ear normal.     Left Ear: External ear normal.     Ears:     Comments: Very HOH    Nose: Nose normal.     Mouth/Throat:     Pharynx:  Oropharynx is clear.  Eyes:     Extraocular Movements: Extraocular movements intact.     Conjunctiva/sclera: Conjunctivae normal.     Pupils: Pupils are equal, round, and reactive to light.  Cardiovascular:     Rate and Rhythm: Normal rate and regular rhythm.  Pulmonary:     Effort: Pulmonary effort is normal. No respiratory distress.     Breath sounds: Normal breath sounds. No wheezing, rhonchi or rales.  Abdominal:     General: Bowel sounds are normal.     Palpations: Abdomen is soft.     Tenderness: There is no abdominal tenderness.  Musculoskeletal:        General: Normal range of motion.     Right lower leg: No edema.     Left lower leg: No edema.  Skin:    General: Skin is warm and dry.     Comments: Pink, no cyanosis and fingers and toes warm to touch  Neurological:     Motor: Weakness present.     Comments: Too drowsy to assess clearly  Psychiatric:     Comments: Flat affect     Labs reviewed: Basic Metabolic Panel: Recent Labs    04/30/20 0615 05/01/20 1034 09/28/20 1225  NA 142 141 137  K 4.5 3.7 4.1  CL 114* 117* 103  CO2 20* 21* 24  GLUCOSE 136* 119* 101*  BUN 45* 19 28*  CREATININE 1.42* 1.09* 1.58*  CALCIUM 8.2* 7.8* 9.2  MG 2.2  --   --   PHOS 4.3  --   --    Liver Function Tests: Recent Labs  04/29/20 2204 04/30/20 0615 09/28/20 1225  AST 13* 11* 20  ALT 10 9 14   ALKPHOS 42 28* 42  BILITOT 0.6 0.7 0.6  PROT 6.2* 5.1* 7.5  ALBUMIN 3.7 3.1* 4.0   No results for input(s): LIPASE, AMYLASE in the last 8760 hours. No results for input(s): AMMONIA in the last 8760 hours. CBC: Recent Labs    04/30/20 0615 04/30/20 1654 05/03/20 0510 05/03/20 0510 05/03/20 1902 05/04/20 0447 09/28/20 1225  WBC 8.2   < > 7.6  --   --  7.9 5.5  NEUTROABS 5.1  --   --   --   --   --  3.1  HGB 7.5*   < > 7.4*   < > 8.6* 9.0* 10.2*  HCT 23.7*   < > 23.9*   < > 27.0* 28.7* 33.4*  MCV 93.3   < > 95.6  --   --  95.3 91.0  PLT 122*   < > 122*  --   --  128*  145*   < > = values in this interval not displayed.   Cardiac Enzymes: No results for input(s): CKTOTAL, CKMB, CKMBINDEX, TROPONINI in the last 8760 hours. BNP: Invalid input(s): POCBNP Lab Results  Component Value Date   HGBA1C 6.3 (H) 04/30/2020   Lab Results  Component Value Date   TSH 0.959 04/30/2020   Lab Results  Component Value Date   VITAMINB12 651 04/29/2020   Lab Results  Component Value Date   FOLATE 41.2 04/29/2020   Lab Results  Component Value Date   IRON 41 04/29/2020   TIBC 318 04/29/2020   FERRITIN 49 04/29/2020    Imaging and Procedures obtained prior to SNF admission: DG Pelvis 1-2 Views  Result Date: 09/28/2020 CLINICAL DATA:  Fall EXAM: PELVIS - 1-2 VIEW COMPARISON:  None. FINDINGS: Decreased osseous mineralization limits sensitivity. There is no evidence of pelvic fracture or diastasis. No pelvic bone lesions are seen. IMPRESSION: No acute fracture or dislocation. Electronically Signed   By: 09/30/2020 M.D.   On: 09/28/2020 13:37   CT Head Wo Contrast  Result Date: 09/28/2020 CLINICAL DATA:  Fall today.  Head injury. EXAM: CT HEAD WITHOUT CONTRAST CT CERVICAL SPINE WITHOUT CONTRAST TECHNIQUE: Multidetector CT imaging of the head and cervical spine was performed following the standard protocol without intravenous contrast. Multiplanar CT image reconstructions of the cervical spine were also generated. COMPARISON:  None. FINDINGS: CT HEAD FINDINGS Brain: Generalized atrophy. Cerebellar atrophy. Extensive hypodensity throughout the cerebral white matter bilaterally which is symmetric. Negative for acute infarct, hemorrhage, mass small dural calcification over the left convexity. Vascular: Negative for hyperdense vessel Skull: Negative for skull fracture Sinuses/Orbits: Paranasal sinuses clear. Bilateral cataract extraction. Other: None CT CERVICAL SPINE FINDINGS Alignment: Mild anterolisthesis C3-4 Skull base and vertebrae: Negative for fracture or mass.  Soft tissues and spinal canal: Thyroid goiter with heterogeneous gland. No focal nodule. No adenopathy. Disc levels: Multilevel disc degeneration and spurring C3 through C7. Bilateral facet degeneration. Upper chest: Stent graft aortic arch. Lung apices clear bilaterally. Other: None IMPRESSION: 1. Atrophy and chronic microvascular ischemic change in the white matter. No acute intracranial abnormality 2. Cervical spondylosis.  Negative for fracture. Electronically Signed   By: 09/30/2020 M.D.   On: 09/28/2020 13:12   CT Cervical Spine Wo Contrast  Result Date: 09/28/2020 CLINICAL DATA:  Fall today.  Head injury. EXAM: CT HEAD WITHOUT CONTRAST CT CERVICAL SPINE WITHOUT CONTRAST TECHNIQUE: Multidetector CT imaging of the head and  cervical spine was performed following the standard protocol without intravenous contrast. Multiplanar CT image reconstructions of the cervical spine were also generated. COMPARISON:  None. FINDINGS: CT HEAD FINDINGS Brain: Generalized atrophy. Cerebellar atrophy. Extensive hypodensity throughout the cerebral white matter bilaterally which is symmetric. Negative for acute infarct, hemorrhage, mass small dural calcification over the left convexity. Vascular: Negative for hyperdense vessel Skull: Negative for skull fracture Sinuses/Orbits: Paranasal sinuses clear. Bilateral cataract extraction. Other: None CT CERVICAL SPINE FINDINGS Alignment: Mild anterolisthesis C3-4 Skull base and vertebrae: Negative for fracture or mass. Soft tissues and spinal canal: Thyroid goiter with heterogeneous gland. No focal nodule. No adenopathy. Disc levels: Multilevel disc degeneration and spurring C3 through C7. Bilateral facet degeneration. Upper chest: Stent graft aortic arch. Lung apices clear bilaterally. Other: None IMPRESSION: 1. Atrophy and chronic microvascular ischemic change in the white matter. No acute intracranial abnormality 2. Cervical spondylosis.  Negative for fracture. Electronically  Signed   By: Marlan Palau M.D.   On: 09/28/2020 13:12   DG Femur Min 2 Views Right  Result Date: 09/28/2020 CLINICAL DATA:  Pain following fall EXAM: RIGHT FEMUR 2 VIEWS COMPARISON:  Right hip radiographs Apr 28, 2019 FINDINGS: Frontal and lateral views were obtained. No fracture or dislocation. No abnormal periosteal reaction. No appreciable knee joint effusion. There are multiple foci of arterial vascular calcification. IMPRESSION: No fracture or dislocation. No appreciable arthropathic change. Foci of atherosclerotic arterial vascular calcification noted. Electronically Signed   By: Bretta Bang III M.D.   On: 09/28/2020 12:52    Assessment/Plan 1. History of fall -led to admission here; now has covid also and has made a decision not to eat -here for PT, OT, but could not tolerate in current state   2. Acute cystitis without hematuria -completed abx for this  3. Vulvovaginitis -has had antifungal cream for this and not c/o irritation any longer  4. Physical deconditioning -was here for PT, OT, now has covid and got much weaker with sats dropping  5. Type 2 diabetes mellitus with stage 3b chronic kidney disease, without long-term current use of insulin (HCC) -well controlled, not on meds now and intake too poor to take any at this time Lab Results  Component Value Date   HGBA1C 6.3 (H) 04/30/2020    6. Stage 3b chronic kidney disease (HCC) -Avoid nephrotoxic agents like nsaids, dose adjust renally excreted meds, hydrate.  7. Lab test positive for detection of COVID-19 virus -d/w her daughter and we opted not to pursue antibody treatment as this might be prolonging her life by artificial means which she's requested not be done -initiate comfort care -if hospice is an option (insurance/financial wise), we'll pursue -certainly begin comfort care measures (see below)  8. Delirium -due to covid, hypoxia, poor po intake, change in environment, possible acute on chronic renal  failure with decreased intake and lasix -stop lasix due to poor intake -comfort care  9. Chronic diastolic CHF (congestive heart failure) (HCC) -noted, hold lasix due to poor intake -monitor for edema  10. DNR (do not resuscitate) - Do not attempt resuscitation (DNR)  11. ACP (advance care planning) -spoke with Bunny for about 20 minutes reviewing MOST that was previously completed and current state with covid -initiate comfort care: D/c lipitor, b12  Hold lasix and potassium Hospice care if can be done Morphine  po q6h prn pain or dyspnea Lorazepam 0.5mg  po q 6h prn dyspnea or anxiety (has to go in system as 0.6mg ) Senna-s 2 tabs po daily  prn constipation NO antibody infusion   Family/ staff Communication:  D/w dtr Hassie Bruce  Labs/tests ordered: none  Khalil Belote L. Taylor Spilde, D.O. Geriatrics [facility] [address], [place] [address] Cell Phone (Mon-Fri 8am-5pm):  [phone] On Call:  [phone] & follow prompts after 5pm & weekends Office Phone:  [phone] Office Fax:  [phone]

## 2020-10-02 NOTE — Telephone Encounter (Signed)
Nurse from [facility] called to report a positive rapid covid test on this resident. She reports that the resident had  a family member visit that tested positive for covid and so they tested Jennifer [name]. She is not having any symptoms. A confirmatory PCR was sent. Isolation ordered for at least 14 days. Provider at facility to follow up.

## 2020-10-02 NOTE — Progress Notes (Signed)
This encounter was created in error - please disregard.

## 2020-10-03 ENCOUNTER — Non-Acute Institutional Stay (SKILLED_NURSING_FACILITY): Payer: Medicare HMO | Admitting: Internal Medicine

## 2020-10-03 ENCOUNTER — Encounter: Payer: Self-pay | Admitting: Internal Medicine

## 2020-10-03 DIAGNOSIS — U071 COVID-19: Secondary | ICD-10-CM | POA: Diagnosis not present

## 2020-10-03 DIAGNOSIS — Z7189 Other specified counseling: Secondary | ICD-10-CM | POA: Diagnosis not present

## 2020-10-03 DIAGNOSIS — R5381 Other malaise: Secondary | ICD-10-CM | POA: Diagnosis not present

## 2020-10-03 DIAGNOSIS — Z9181 History of falling: Secondary | ICD-10-CM | POA: Diagnosis not present

## 2020-10-03 DIAGNOSIS — N1832 Chronic kidney disease, stage 3b: Secondary | ICD-10-CM | POA: Diagnosis not present

## 2020-10-03 DIAGNOSIS — N760 Acute vaginitis: Secondary | ICD-10-CM

## 2020-10-03 DIAGNOSIS — I5032 Chronic diastolic (congestive) heart failure: Secondary | ICD-10-CM

## 2020-10-03 DIAGNOSIS — N3000 Acute cystitis without hematuria: Secondary | ICD-10-CM

## 2020-10-03 DIAGNOSIS — E1122 Type 2 diabetes mellitus with diabetic chronic kidney disease: Secondary | ICD-10-CM | POA: Diagnosis not present

## 2020-10-03 DIAGNOSIS — R410 Disorientation, unspecified: Secondary | ICD-10-CM

## 2020-10-03 DIAGNOSIS — Z66 Do not resuscitate: Secondary | ICD-10-CM

## 2020-10-03 MED ORDER — SENNOSIDES-DOCUSATE SODIUM 8.6-50 MG PO TABS: 2.0000 | ORAL_TABLET | Freq: Every day | ORAL | 5 refills | Status: AC | PRN

## 2020-10-03 MED ORDER — MORPHINE SULFATE (CONCENTRATE) 20 MG/ML PO SOLN: 5.0000 mg | Freq: Four times a day (QID) | ORAL | 0 refills | Status: AC | PRN

## 2020-10-03 MED ORDER — LORAZEPAM 2 MG/ML PO CONC: 0.5000 mg | Freq: Four times a day (QID) | ORAL | 0 refills | Status: AC | PRN

## 2020-10-03 NOTE — Telephone Encounter (Signed)
Noted thank  You

## 2020-10-13 ENCOUNTER — Other Ambulatory Visit: Payer: Self-pay | Admitting: *Deleted

## 2020-10-13 NOTE — Patient Outreach (Signed)
[facility]) Care Management  10/13/2020  Jennifer [name] Feb 24, 1925 [id number]  Transition of care   Referral received : 10/13/20 Referral Source: [employer] notification of discharge Date of Discharge : 10/12/20 Facility : [facility]   Referral received. Transition of care calls being completed via EMMI-automated calls. RN CM will outreach patient for any red flags received.   Case Closure Reason: enrolled in another program.   Egbert Garibaldi, RN, BSN  [facility],Care Management Coordinator  [phone]- Mobile [phone]- Toll Free Main Office

## 2020-10-24 ENCOUNTER — Encounter: Payer: Self-pay | Admitting: Internal Medicine

## 2020-11-01 DEATH — deceased

## 2021-10-26 IMAGING — XA IR ANGIO/VISCERAL SELECTIVE EA VESSEL WO/W FLUSH
7 series · 13 of 24 positions shown · IV contrast (IODINE)
Comparison: none

INDICATION: Acute GI bleed requiring blood transfusion. Positive nuclear
medicine bleeding scan in the region of small bowel and or proximal
colon. History of prior abdominal aortic aneurysm resection with
placement of aortic tube graft and prior TEVAR.

[Series 1: care body 4 · 2 of 104 frames shown (1 of 5)]
[frame 16/104]
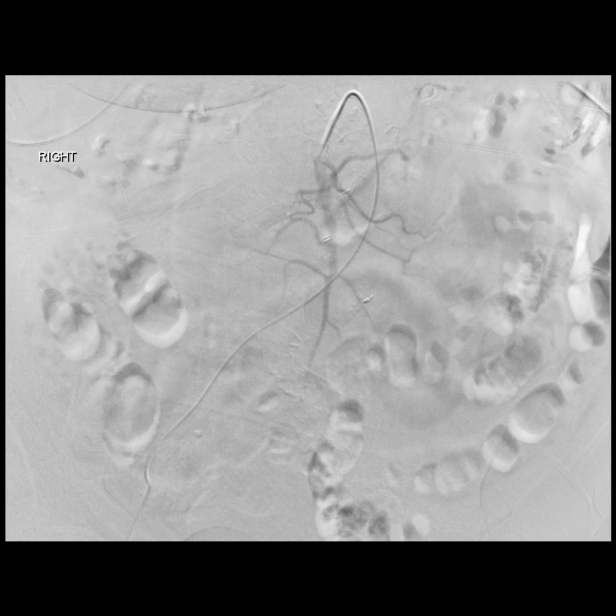
[frame 89/104]
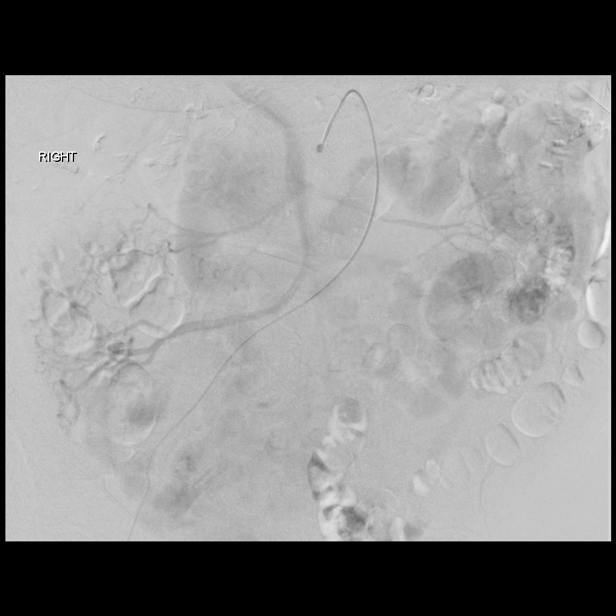

[Series 2: care body 4 · 1 of 98 frames shown (2 of 5)]
[frame 50/98]
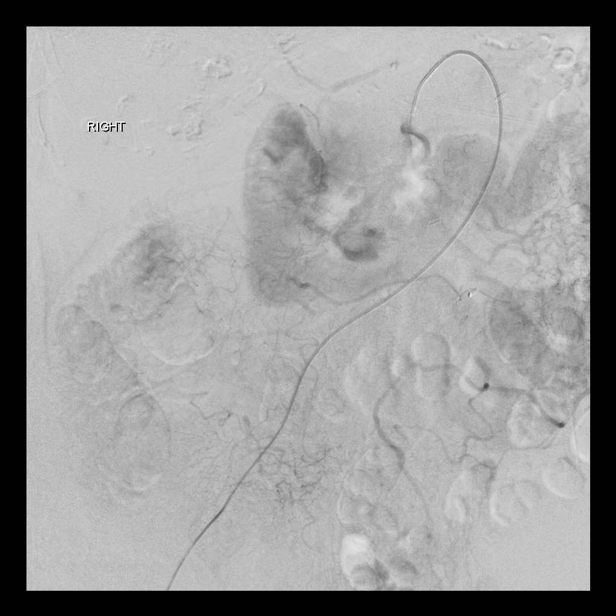

[Series 3: care body 4 · 2 of 99 frames shown (3 of 5)]
[frame 15/99]
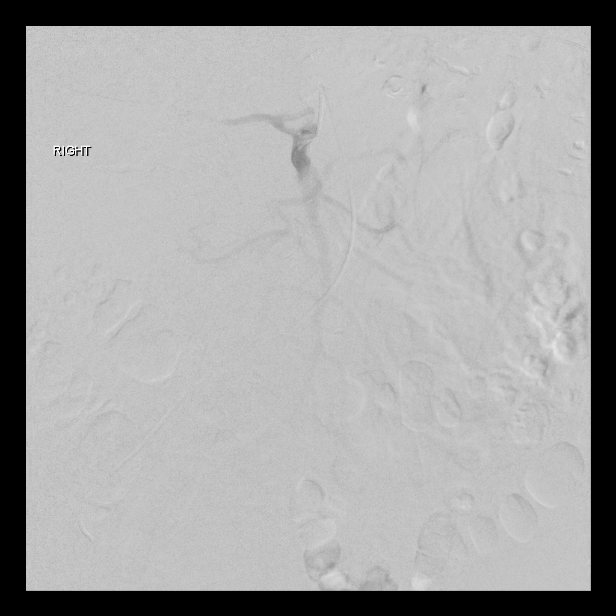
[frame 50/99]
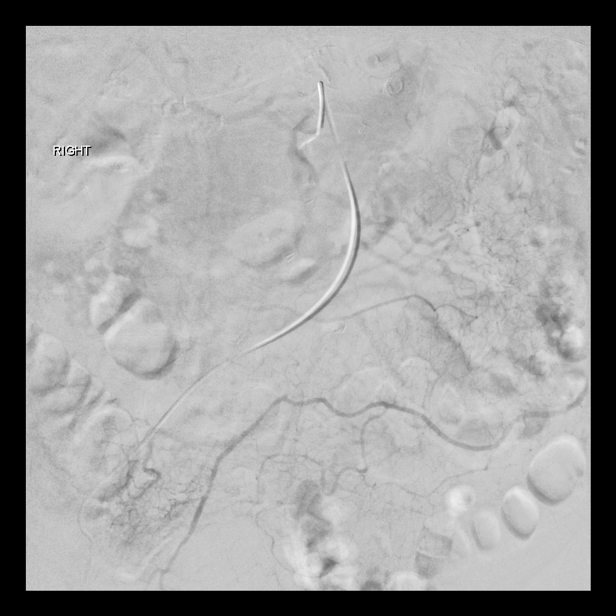

[Series 4: care body 4 · 2 of 92 frames shown (4 of 5)]
[frame 47/92]
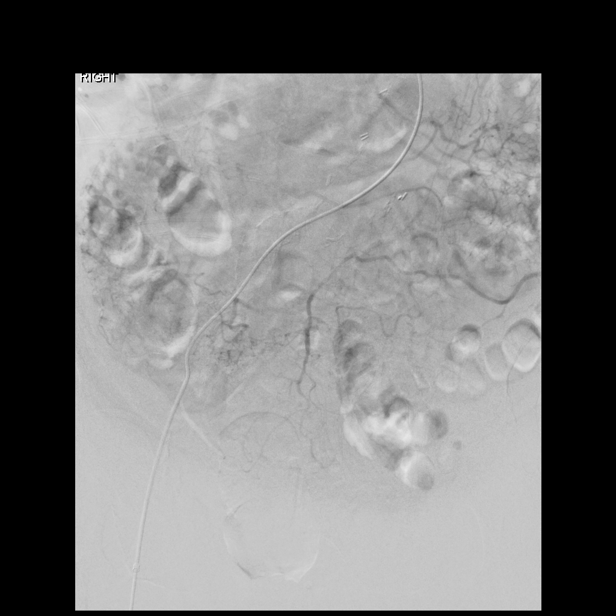
[frame 79/92]
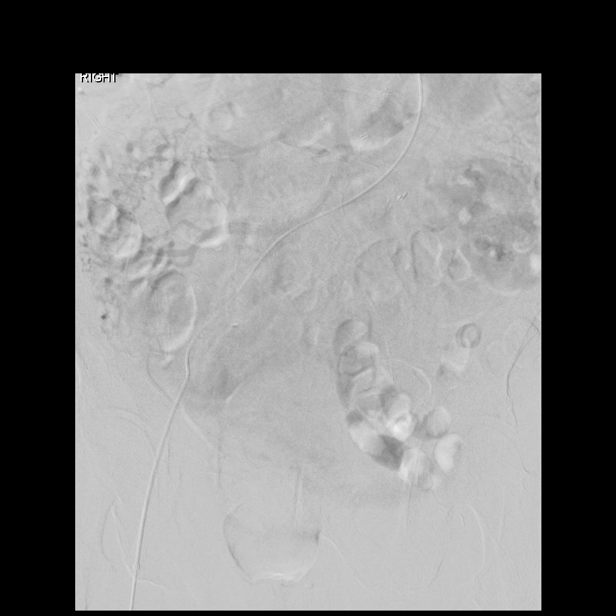

[Series 5: care body 4 · 2 of 25 frames shown (5 of 5)]
[frame 4/25]
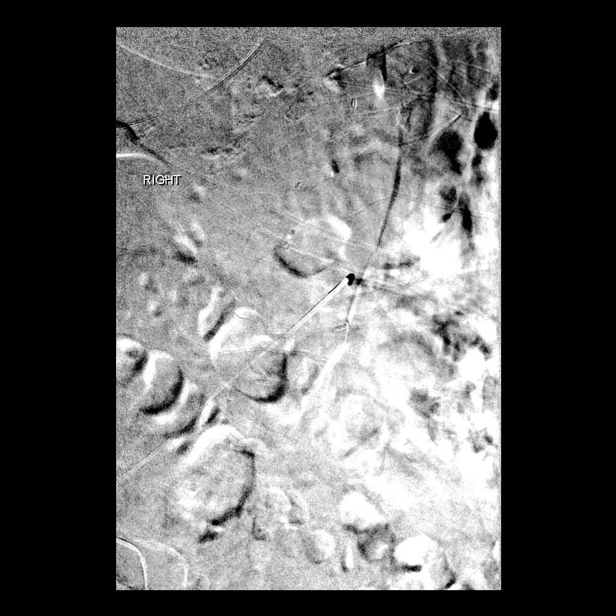
[frame 25/25]
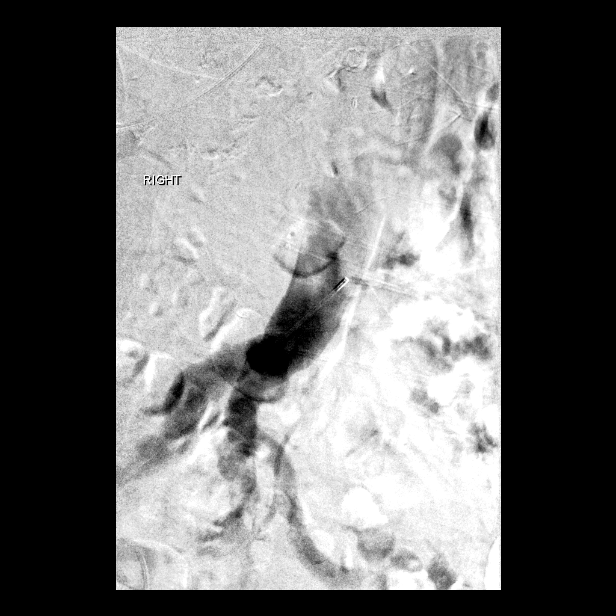

[Series 6: fl - angio · 1 of 22 frames shown]
[frame 12/22]
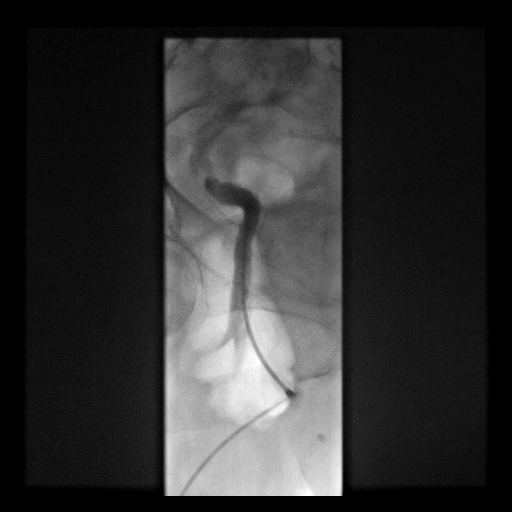

[Series 300: ld dsa body · 3 of 6 slices shown]
[im 1/6]
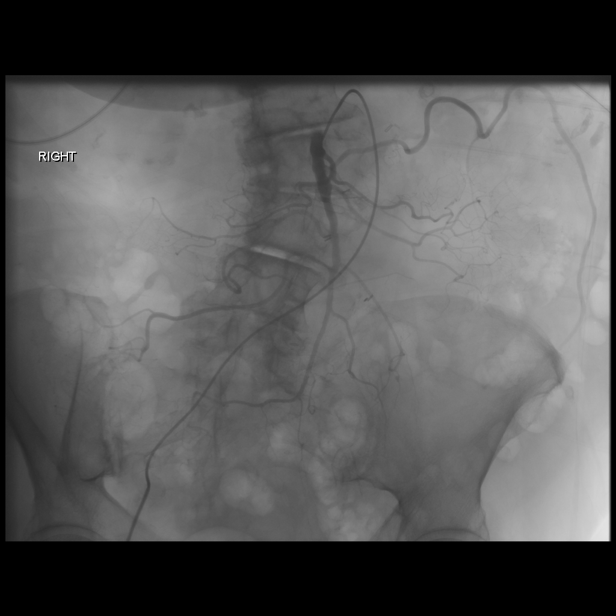
[im 3/6]
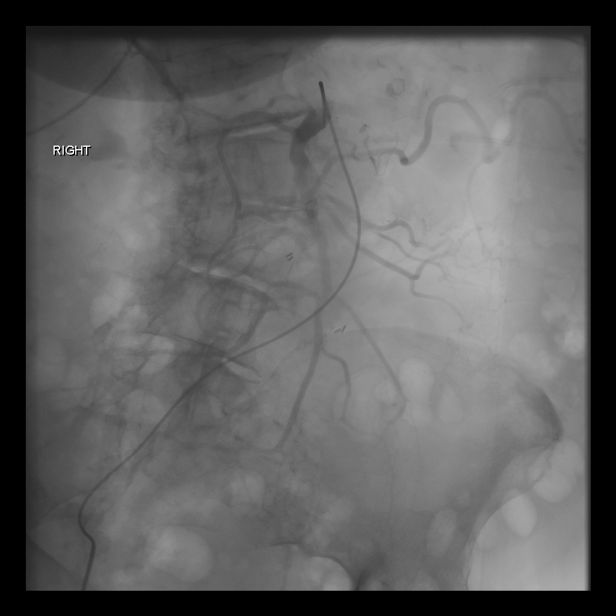
[im 6/6]
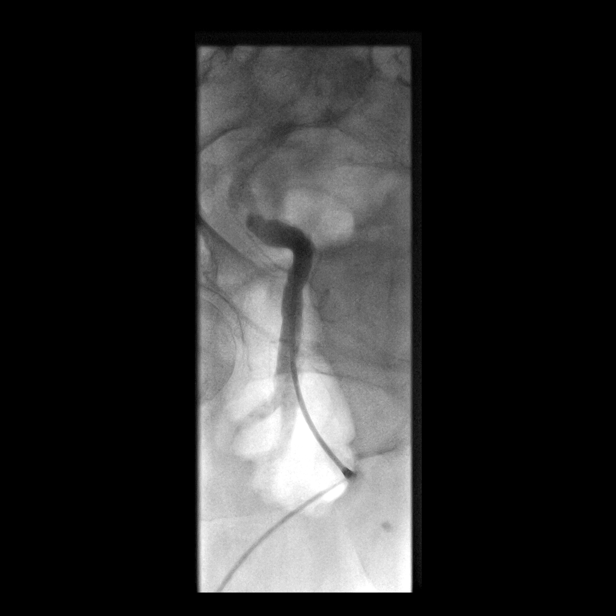

[13 of 24 positions shown; findings below may reference images not displayed]

EXAM:
1. ULTRASOUND GUIDANCE FOR VASCULAR ACCESS OF THE RIGHT COMMON
FEMORAL ARTERY
2. SELECTIVE ARTERIOGRAPHY OF THE SUPERIOR MESENTERIC ARTERY

MEDICATIONS:
100 mcg intra-arterial nitroglycerin administered during the
procedure.

ANESTHESIA/SEDATION:
Moderate (conscious) sedation was employed during this procedure. A
total of Versed 2.5 mg and Fentanyl 100 mcg was administered
intravenously.

Moderate Sedation Time: 38 minutes. The patient's level of
consciousness and vital signs were monitored continuously by
radiology nursing throughout the procedure under my direct
supervision.

CONTRAST:  99 mL Visipaque 320

FLUOROSCOPY TIME:  Fluoroscopy Time: 54 seconds.  368 mGy.

COMPLICATIONS:
None immediate.



Ultrasound was used to confirm patency of the right common femoral
artery. Under direct ultrasound guidance, access of the artery was
performed with a 21 gauge needle and micropuncture set. After
establishing arterial access, a 5 French sheath was placed over a
guidewire.

A 5 French Cobra catheter was advanced into the abdominal aorta.
This was used to selectively catheterize the superior mesenteric
artery. The catheter was further advanced into the trunk of the SMA
over a guidewire. Selective arteriography of the SMA was then
performed in multiple projections prior to, and following,
administration of intra-arterial nitroglycerin.

Additional arteriography was performed through the catheter at the
level of the distal aorta.

The catheter was removed. Hemostasis was obtained after sheath
removal utilizing the Cordis ExoSeal device and manual compression.
FINDINGS: Selective superior mesenteric arteriography demonstrates normal
patency of supply to the small bowel and colon without evidence of
active bleeding, arteriovenous malformation, pseudoaneurysm or other
vascular abnormality. There is incidental replacement of the right
hepatic artery off of the proximal SMA. A prominent Arc of [place] is
present extending into the IMA territory and supplying the
descending colon and rectum.

Additional injection at the level of the distal aorta confirms
ligation and sacrifice of the inferior mesenteric artery at the time
of abdominal aortic aneurysm repair. The distal segment of the
aortic tube graft shows normal patency.
IMPRESSION: Selective superior mesenteric arteriography does not demonstrate
evidence of active bleeding source, AV malformation or other
vascular abnormality. Embolization was not able to be performed. The
SMA also supplies IMA territory via an Arc of [place]iol[place] after prior IMA
ligation.
